# Patient Record
Sex: Male | Born: 1955 | Race: Black or African American | Hispanic: No | Marital: Single | Smoking: Current every day smoker
Health system: Southern US, Community
[De-identification: ages and names within clinical notes are randomized; demographics above are authoritative.]

## PROBLEM LIST (undated history)

## (undated) DIAGNOSIS — J449 Chronic obstructive pulmonary disease, unspecified: Secondary | ICD-10-CM

## (undated) DIAGNOSIS — Z91199 Patient's noncompliance with other medical treatment and regimen due to unspecified reason: Secondary | ICD-10-CM

## (undated) DIAGNOSIS — I5020 Unspecified systolic (congestive) heart failure: Secondary | ICD-10-CM

## (undated) DIAGNOSIS — I509 Heart failure, unspecified: Secondary | ICD-10-CM

## (undated) DIAGNOSIS — Z720 Tobacco use: Secondary | ICD-10-CM

## (undated) DIAGNOSIS — I5022 Chronic systolic (congestive) heart failure: Secondary | ICD-10-CM

## (undated) DIAGNOSIS — I5042 Chronic combined systolic (congestive) and diastolic (congestive) heart failure: Secondary | ICD-10-CM

## (undated) DIAGNOSIS — E119 Type 2 diabetes mellitus without complications: Secondary | ICD-10-CM

## (undated) DIAGNOSIS — J90 Pleural effusion, not elsewhere classified: Secondary | ICD-10-CM

## (undated) DIAGNOSIS — I428 Other cardiomyopathies: Secondary | ICD-10-CM

## (undated) DIAGNOSIS — Z9119 Patient's noncompliance with other medical treatment and regimen: Secondary | ICD-10-CM

## (undated) DIAGNOSIS — I10 Essential (primary) hypertension: Secondary | ICD-10-CM

## (undated) HISTORY — DX: Pleural effusion, not elsewhere classified: J90

## (undated) HISTORY — DX: Chronic combined systolic (congestive) and diastolic (congestive) heart failure: I50.42

## (undated) HISTORY — DX: Unspecified systolic (congestive) heart failure: I50.20

## (undated) HISTORY — DX: Patient's noncompliance with other medical treatment and regimen: Z91.19

## (undated) HISTORY — DX: Patient's noncompliance with other medical treatment and regimen due to unspecified reason: Z91.199

## (undated) HISTORY — DX: Chronic obstructive pulmonary disease, unspecified: J44.9

## (undated) HISTORY — DX: Type 2 diabetes mellitus without complications: E11.9

---

## 2005-10-27 ENCOUNTER — Emergency Department: Payer: Self-pay | Admitting: Emergency Medicine

## 2019-07-29 ENCOUNTER — Emergency Department: Payer: Medicaid - Out of State

## 2019-07-29 ENCOUNTER — Encounter: Payer: Self-pay | Admitting: Emergency Medicine

## 2019-07-29 ENCOUNTER — Emergency Department
Admission: EM | Admit: 2019-07-29 | Discharge: 2019-07-29 | Disposition: A | Discharge status: Home / Self Care | Payer: Medicaid - Out of State | Attending: Emergency Medicine | Admitting: Emergency Medicine

## 2019-07-29 ENCOUNTER — Other Ambulatory Visit: Payer: Self-pay

## 2019-07-29 DIAGNOSIS — Z87891 Personal history of nicotine dependence: Secondary | ICD-10-CM | POA: Diagnosis not present

## 2019-07-29 DIAGNOSIS — I110 Hypertensive heart disease with heart failure: Secondary | ICD-10-CM | POA: Diagnosis not present

## 2019-07-29 DIAGNOSIS — I10 Essential (primary) hypertension: Secondary | ICD-10-CM

## 2019-07-29 DIAGNOSIS — Z20828 Contact with and (suspected) exposure to other viral communicable diseases: Secondary | ICD-10-CM | POA: Insufficient documentation

## 2019-07-29 DIAGNOSIS — Z79899 Other long term (current) drug therapy: Secondary | ICD-10-CM | POA: Diagnosis not present

## 2019-07-29 DIAGNOSIS — I509 Heart failure, unspecified: Secondary | ICD-10-CM

## 2019-07-29 DIAGNOSIS — R2243 Localized swelling, mass and lump, lower limb, bilateral: Secondary | ICD-10-CM | POA: Diagnosis present

## 2019-07-29 HISTORY — DX: Essential (primary) hypertension: I10

## 2019-07-29 HISTORY — DX: Heart failure, unspecified: I50.9

## 2019-07-29 LAB — CBC
HCT: 33.5 % — ABNORMAL LOW (ref 39.0–52.0)
Hemoglobin: 10.5 g/dL — ABNORMAL LOW (ref 13.0–17.0)
MCH: 28.6 pg (ref 26.0–34.0)
MCHC: 31.3 g/dL (ref 30.0–36.0)
MCV: 91.3 fL (ref 80.0–100.0)
Platelets: 288 10*3/uL (ref 150–400)
RBC: 3.67 MIL/uL — ABNORMAL LOW (ref 4.22–5.81)
RDW: 15.9 % — ABNORMAL HIGH (ref 11.5–15.5)
WBC: 6.8 10*3/uL (ref 4.0–10.5)
nRBC: 0 % (ref 0.0–0.2)

## 2019-07-29 LAB — BASIC METABOLIC PANEL
Anion gap: 9 (ref 5–15)
BUN: 26 mg/dL — ABNORMAL HIGH (ref 8–23)
CO2: 23 mmol/L (ref 22–32)
Calcium: 8.8 mg/dL — ABNORMAL LOW (ref 8.9–10.3)
Chloride: 106 mmol/L (ref 98–111)
Creatinine, Ser: 1.2 mg/dL (ref 0.61–1.24)
GFR calc Af Amer: >60 mL/min (ref >60)
GFR calc non Af Amer: >60 mL/min (ref >60)
Glucose, Bld: 106 mg/dL — ABNORMAL HIGH (ref 70–99)
Potassium: 4.3 mmol/L (ref 3.5–5.1)
Sodium: 138 mmol/L (ref 135–145)

## 2019-07-29 LAB — BRAIN NATRIURETIC PEPTIDE: B Natriuretic Peptide: 2085 pg/mL — ABNORMAL HIGH (ref 0.0–100.0)

## 2019-07-29 LAB — TROPONIN I (HIGH SENSITIVITY)
Troponin I (High Sensitivity): 75 ng/L — ABNORMAL HIGH (ref <18)
Troponin I (High Sensitivity): 83 ng/L — ABNORMAL HIGH (ref <18)

## 2019-07-29 MED ORDER — HYDRALAZINE HCL 50 MG PO TABS
25.0000 mg | ORAL_TABLET | Freq: Once | ORAL | Status: AC
  Administered 2019-07-29: 25 mg via ORAL
  Filled 2019-07-29: qty 1

## 2019-07-29 MED ORDER — HYDRALAZINE HCL 25 MG PO TABS: 25.0000 mg | ORAL_TABLET | Freq: Three times a day (TID) | ORAL | 0 refills | Status: DC

## 2019-07-29 MED ORDER — FUROSEMIDE 40 MG PO TABS
40.0000 mg | ORAL_TABLET | Freq: Once | ORAL | Status: AC
  Administered 2019-07-29: 40 mg via ORAL
  Filled 2019-07-29: qty 1

## 2019-07-29 MED ORDER — BREO ELLIPTA 200-25 MCG/INH IN AEPB: 1.0000 | INHALATION_SPRAY | Freq: Every day | RESPIRATORY_TRACT | 0 refills | Status: DC

## 2019-07-29 MED ORDER — ALBUTEROL SULFATE HFA 108 (90 BASE) MCG/ACT IN AERS
2.0000 | INHALATION_SPRAY | Freq: Once | RESPIRATORY_TRACT | Status: AC
  Administered 2019-07-29: 2 via RESPIRATORY_TRACT
  Filled 2019-07-29: qty 6.7

## 2019-07-29 MED ORDER — SODIUM CHLORIDE 0.9% FLUSH: 3.0000 mL | Freq: Once | INTRAVENOUS | Status: DC

## 2019-07-29 MED ORDER — FUROSEMIDE 20 MG PO TABS: 20.0000 mg | ORAL_TABLET | Freq: Two times a day (BID) | ORAL | 0 refills | Status: DC

## 2019-07-29 MED ORDER — HYDROCHLOROTHIAZIDE 12.5 MG PO CAPS: 12.5000 mg | ORAL_CAPSULE | Freq: Every day | ORAL | 0 refills | Status: DC

## 2019-07-29 MED ORDER — HYDROCHLOROTHIAZIDE 12.5 MG PO CAPS
12.5000 mg | ORAL_CAPSULE | Freq: Once | ORAL | Status: AC
  Administered 2019-07-29: 12.5 mg via ORAL
  Filled 2019-07-29: qty 1

## 2019-07-29 NOTE — ED Provider Notes (Signed)
[facility] Emergency Department Provider Note   ____________________________________________   First MD Initiated Contact with Patient 07/29/19 [phone]     (approximate)  I have reviewed the triage vital signs and the nursing notes.   HISTORY  Chief Complaint Leg Swelling    HPI Kevin [name] is a 63 y.o. male with past medical history of hypertension, CHF, and COPD presents to the ED complaining of leg swelling and cough.  Patient reports that he relocated to the area back in March in order to care for his ill mother.  He had been taking his medications up until 2 weeks ago, when he ran out of all of them.  Since then, he has had gradually worsening swelling in his bilateral lower extremities and more recently has developed a dry cough.  He denies any fevers, chills, chest pain, or shortness of breath, but does complain of a sore throat.  He is not sure of all of the medications that he takes, but does have papers with him listing all of his prescriptions.  He has not yet established primary care in this area.  He is not aware of any recent sick contacts.        Past Medical History:  Diagnosis Date   CHF (congestive heart failure) (HCC)    Hypertension     There are no active problems to display for this patient.   History reviewed. No pertinent surgical history.  Prior to Admission medications   Medication Sig Start Date End Date Taking? Authorizing Provider  acetaminophen (TYLENOL) 325 MG tablet Take 650 mg by mouth every 6 (six) hours as needed.   Yes [provider]  albuterol (VENTOLIN HFA) 108 (90 Base) MCG/ACT inhaler Inhale 1-2 puffs into the lungs every 6 (six) hours as needed for wheezing or shortness of breath.   Yes [provider]  calcium carbonate (TUMS - DOSED IN MG ELEMENTAL CALCIUM) 500 MG chewable tablet Chew 1 tablet by mouth daily.   Yes [provider]  carvedilol (COREG) 25 MG tablet Take 25 mg by  mouth daily.   Yes [provider]  ferrous sulfate 325 (65 FE) MG tablet Take 325 mg by mouth daily with breakfast.   Yes [provider]  gabapentin (NEURONTIN) 300 MG capsule Take 300 mg by mouth 3 (three) times daily.   Yes [provider]  ipratropium-albuterol (DUONEB) 0.5-2.5 (3) MG/3ML SOLN Take 3 mLs by nebulization every 6 (six) hours as needed.   Yes [provider]  ondansetron (ZOFRAN) 4 MG tablet Take 4 mg by mouth every 8 (eight) hours as needed for nausea or vomiting.   Yes [provider]  rivaroxaban (XARELTO) 20 MG TABS tablet Take 20 mg by mouth daily with supper.   Yes [provider]  tiotropium (SPIRIVA) 18 MCG inhalation capsule Place 18 mcg into inhaler and inhale daily.   Yes [provider]  traMADol (ULTRAM) 50 MG tablet Take 50 mg by mouth every 6 (six) hours as needed.   Yes [provider]  fluticasone furoate-vilanterol (BREO ELLIPTA) 200-25 MCG/INH AEPB Inhale 1 puff into the lungs daily. 07/29/19   Chesley Noon, MD  furosemide (LASIX) 20 MG tablet Take 1 tablet (20 mg total) by mouth 2 (two) times daily for 10 days. 07/29/19 08/08/19  Chesley Noon, MD  hydrALAZINE (APRESOLINE) 25 MG tablet Take 1 tablet (25 mg total) by mouth 3 (three) times daily. 07/29/19 08/28/19  Chesley Noon, MD  hydrochlorothiazide (MICROZIDE)  12.5 MG capsule Take 1 capsule (12.5 mg total) by mouth daily. 07/29/19 08/28/19  Blake Divine, MD    Allergies Patient has no known allergies.  No family history on file.  Social History Social History   Tobacco Use   Smoking status: Former Smoker   Smokeless tobacco: Never Used  Substance Use Topics   Alcohol use: Never    Frequency: Never   Drug use: Not Currently    Review of Systems  Constitutional: No fever/chills Eyes: No visual changes. ENT: Positive for sore throat. Cardiovascular: Denies chest pain. Respiratory: Denies shortness of breath.   Positive for cough. Gastrointestinal: No abdominal pain.  No nausea, no vomiting.  No diarrhea.  No constipation. Genitourinary: Negative for dysuria. Musculoskeletal: Negative for back pain.  Positive for leg swelling. Skin: Negative for rash. Neurological: Negative for headaches, focal weakness or numbness.  ____________________________________________   PHYSICAL EXAM:  VITAL SIGNS: ED Triage Vitals [07/29/19 0820]  Enc Vitals Group     BP (!) 176/125     Pulse Rate 89     Resp 18     Temp 98.2 F (36.8 C)     Temp Source Oral     SpO2 100 %     Weight 200 lb (90.7 kg)     Height 5' 7.5" (1.715 m)     Head Circumference      Peak Flow      Pain Score 7     Pain Loc      Pain Edu?      Excl. in [place]?     Constitutional: Alert and oriented. Eyes: Conjunctivae are normal. Head: Atraumatic. Nose: No congestion/rhinnorhea. Mouth/Throat: Mucous membranes are moist. Neck: Normal ROM Cardiovascular: Normal rate, regular rhythm. Grossly normal heart sounds. Respiratory: Normal respiratory effort.  No retractions. Lungs CTAB. Gastrointestinal: Soft and nontender. No distention. Genitourinary: deferred Musculoskeletal: 2+ pitting edema to bilateral lower extremities with no calf tenderness. Neurologic:  Normal speech and language. No gross focal neurologic deficits are appreciated. Skin:  Skin is warm, dry and intact. No rash noted. Psychiatric: Mood and affect are normal. Speech and behavior are normal.  ____________________________________________   LABS (all labs ordered are listed, but only abnormal results are displayed)  Labs Reviewed  BASIC METABOLIC PANEL - Abnormal; Notable for the following components:      Result Value   Glucose, Bld 106 (*)    BUN 26 (*)    Calcium 8.8 (*)    All other components within normal limits  CBC - Abnormal; Notable for the following components:   RBC 3.67 (*)    Hemoglobin 10.5 (*)    HCT 33.5 (*)    RDW 15.9 (*)    All other  components within normal limits  BRAIN NATRIURETIC PEPTIDE - Abnormal; Notable for the following components:   B Natriuretic Peptide 2,085.0 (*)    All other components within normal limits  TROPONIN I (HIGH SENSITIVITY) - Abnormal; Notable for the following components:   Troponin I (High Sensitivity) 75 (*)    All other components within normal limits  TROPONIN I (HIGH SENSITIVITY) - Abnormal; Notable for the following components:   Troponin I (High Sensitivity) 83 (*)    All other components within normal limits  NOVEL CORONAVIRUS, NAA (HOSP ORDER, SEND-OUT TO REF LAB; TAT 18-24 HRS)   ____________________________________________  EKG  ED ECG REPORT I, Blake Divine, the attending physician, personally viewed and interpreted this ECG.   Date: 07/29/2019  EKG Time: 8:33  Rate: 86  Rhythm: normal sinus rhythm  Axis: RAD  Intervals:nonspecific intraventricular conduction delay  ST&T Change: LVH   PROCEDURES  Procedure(s) performed (including Critical Care):  Procedures   ____________________________________________   INITIAL IMPRESSION / ASSESSMENT AND PLAN / ED COURSE       63 year old male with hx of CHF presents to the ED with increasing lower extremity swelling over the past couple of weeks since he has been out of his medications.  He is in no respiratory distress and denies any chest pain.  EKG shows no evidence of acute ischemia and chest x-ray shows cardiomegaly without evidence of pulmonary edema.  Suspect mild CHF exacerbation given his lower extremity swelling.  We will need to start patient back on his home medications as he is significantly hypertensive here.  He does have Xarelto on his med list, is unsure what he took this for but states that his PCP had since taken him off of it.  Labs pending, if unremarkable patient would be appropriate with close follow-up with the heart failure clinic as well as a local PCP.  Given his dry cough, we will also perform  COVID-19 testing, although this may be related to his COPD.  Patient remains hypertensive following home dose of medications, however there is no evidence of hypertensive emergency.  Doubt ACS as troponin is stable on recheck, suspect elevation related to chronic kidney disease as well as CHF.  Patient was given initial dose of Lasix here, referred to heart failure clinic for close follow-up.  Counseled patient to return to the ED for new or worsening symptoms, patient agrees with plan.      ____________________________________________   FINAL CLINICAL IMPRESSION(S) / ED DIAGNOSES  Final diagnoses:  Chronic congestive heart failure, unspecified heart failure type (HCC)  Uncontrolled hypertension     ED Discharge Orders         Ordered    AMB referral to CHF clinic     07/29/19 1247    fluticasone furoate-vilanterol (BREO ELLIPTA) 200-25 MCG/INH AEPB  Daily     07/29/19 1251    hydrALAZINE (APRESOLINE) 25 MG tablet  3 times daily     07/29/19 1251    hydrochlorothiazide (MICROZIDE) 12.5 MG capsule  Daily     07/29/19 1251    furosemide (LASIX) 20 MG tablet  2 times daily     07/29/19 1251           Note:  This document was prepared using Dragon voice recognition software and may include unintentional dictation errors.   Chesley Noon, MD 07/29/19 [phone]

## 2019-07-29 NOTE — Discharge Instructions (Signed)
Please schedule follow-up with both the heart failure clinic and a local primary care doctor as soon as possible.  You have been written prescriptions for your home blood pressure medications and inhaler and you will also be started on Lasix, which is a fluid pill.  If you have any worsening symptoms, please return to the ER for reevaluation.

## 2019-07-29 NOTE — ED Notes (Signed)
Assisted pt to use the urinal.

## 2019-07-29 NOTE — ED Triage Notes (Signed)
Lower bilateral extremity swelling / pain ( hx of CHF) , been in the area since March , ran out of blood pressure meds x2 weeks , dry cough/ sore throat x2 weeks. Pt uses a wheeled walker to assist with ambulation

## 2019-07-30 LAB — NOVEL CORONAVIRUS, NAA (HOSP ORDER, SEND-OUT TO REF LAB; TAT 18-24 HRS): SARS-CoV-2, NAA: NOT DETECTED

## 2019-08-03 ENCOUNTER — Emergency Department: Payer: Self-pay

## 2019-08-03 ENCOUNTER — Other Ambulatory Visit: Payer: Self-pay

## 2019-08-03 ENCOUNTER — Inpatient Hospital Stay
Admission: EM | Admit: 2019-08-03 | Discharge: 2019-08-10 | DRG: 292 | Disposition: A | Discharge status: Home Health | Payer: Self-pay | Attending: Internal Medicine | Admitting: Internal Medicine

## 2019-08-03 DIAGNOSIS — I509 Heart failure, unspecified: Secondary | ICD-10-CM

## 2019-08-03 DIAGNOSIS — Z79891 Long term (current) use of opiate analgesic: Secondary | ICD-10-CM

## 2019-08-03 DIAGNOSIS — I071 Rheumatic tricuspid insufficiency: Secondary | ICD-10-CM | POA: Diagnosis present

## 2019-08-03 DIAGNOSIS — E1140 Type 2 diabetes mellitus with diabetic neuropathy, unspecified: Secondary | ICD-10-CM

## 2019-08-03 DIAGNOSIS — J9801 Acute bronchospasm: Secondary | ICD-10-CM | POA: Diagnosis not present

## 2019-08-03 DIAGNOSIS — R600 Localized edema: Secondary | ICD-10-CM

## 2019-08-03 DIAGNOSIS — I110 Hypertensive heart disease with heart failure: Principal | ICD-10-CM | POA: Diagnosis present

## 2019-08-03 DIAGNOSIS — J9811 Atelectasis: Secondary | ICD-10-CM | POA: Diagnosis present

## 2019-08-03 DIAGNOSIS — J90 Pleural effusion, not elsewhere classified: Secondary | ICD-10-CM

## 2019-08-03 DIAGNOSIS — I5023 Acute on chronic systolic (congestive) heart failure: Secondary | ICD-10-CM

## 2019-08-03 DIAGNOSIS — R7401 Elevation of levels of liver transaminase levels: Secondary | ICD-10-CM

## 2019-08-03 DIAGNOSIS — Z87891 Personal history of nicotine dependence: Secondary | ICD-10-CM

## 2019-08-03 DIAGNOSIS — J449 Chronic obstructive pulmonary disease, unspecified: Secondary | ICD-10-CM

## 2019-08-03 DIAGNOSIS — J441 Chronic obstructive pulmonary disease with (acute) exacerbation: Secondary | ICD-10-CM

## 2019-08-03 DIAGNOSIS — I959 Hypotension, unspecified: Secondary | ICD-10-CM

## 2019-08-03 DIAGNOSIS — R55 Syncope and collapse: Secondary | ICD-10-CM | POA: Diagnosis not present

## 2019-08-03 DIAGNOSIS — I5043 Acute on chronic combined systolic (congestive) and diastolic (congestive) heart failure: Secondary | ICD-10-CM | POA: Diagnosis present

## 2019-08-03 DIAGNOSIS — R601 Generalized edema: Secondary | ICD-10-CM

## 2019-08-03 DIAGNOSIS — Z79899 Other long term (current) drug therapy: Secondary | ICD-10-CM

## 2019-08-03 DIAGNOSIS — I420 Dilated cardiomyopathy: Secondary | ICD-10-CM | POA: Diagnosis present

## 2019-08-03 DIAGNOSIS — I5033 Acute on chronic diastolic (congestive) heart failure: Secondary | ICD-10-CM

## 2019-08-03 DIAGNOSIS — Z9889 Other specified postprocedural states: Secondary | ICD-10-CM

## 2019-08-03 DIAGNOSIS — I160 Hypertensive urgency: Secondary | ICD-10-CM | POA: Diagnosis present

## 2019-08-03 DIAGNOSIS — E876 Hypokalemia: Secondary | ICD-10-CM | POA: Diagnosis not present

## 2019-08-03 DIAGNOSIS — G629 Polyneuropathy, unspecified: Secondary | ICD-10-CM

## 2019-08-03 DIAGNOSIS — R7989 Other specified abnormal findings of blood chemistry: Secondary | ICD-10-CM

## 2019-08-03 DIAGNOSIS — Z20828 Contact with and (suspected) exposure to other viral communicable diseases: Secondary | ICD-10-CM | POA: Diagnosis present

## 2019-08-03 DIAGNOSIS — I4729 Other ventricular tachycardia: Secondary | ICD-10-CM

## 2019-08-03 DIAGNOSIS — I472 Ventricular tachycardia: Secondary | ICD-10-CM | POA: Diagnosis not present

## 2019-08-03 LAB — CBC
HCT: 34.4 % — ABNORMAL LOW (ref 39.0–52.0)
Hemoglobin: 10.7 g/dL — ABNORMAL LOW (ref 13.0–17.0)
MCH: 28.2 pg (ref 26.0–34.0)
MCHC: 31.1 g/dL (ref 30.0–36.0)
MCV: 90.5 fL (ref 80.0–100.0)
Platelets: 310 10*3/uL (ref 150–400)
RBC: 3.8 MIL/uL — ABNORMAL LOW (ref 4.22–5.81)
RDW: 16 % — ABNORMAL HIGH (ref 11.5–15.5)
WBC: 8.8 10*3/uL (ref 4.0–10.5)
nRBC: 0 % (ref 0.0–0.2)

## 2019-08-03 LAB — HEPATIC FUNCTION PANEL
ALT: 53 U/L — ABNORMAL HIGH (ref 0–44)
AST: 48 U/L — ABNORMAL HIGH (ref 15–41)
Albumin: 3.1 g/dL — ABNORMAL LOW (ref 3.5–5.0)
Alkaline Phosphatase: 165 U/L — ABNORMAL HIGH (ref 38–126)
Bilirubin, Direct: 0.5 mg/dL — ABNORMAL HIGH (ref 0.0–0.2)
Indirect Bilirubin: 0.8 mg/dL (ref 0.3–0.9)
Total Bilirubin: 1.3 mg/dL — ABNORMAL HIGH (ref 0.3–1.2)
Total Protein: 6.9 g/dL (ref 6.5–8.1)

## 2019-08-03 LAB — URINALYSIS, ROUTINE W REFLEX MICROSCOPIC
Bacteria, UA: NONE SEEN
Bilirubin Urine: NEGATIVE
Glucose, UA: NEGATIVE mg/dL
Hgb urine dipstick: NEGATIVE
Ketones, ur: NEGATIVE mg/dL
Leukocytes,Ua: NEGATIVE
Nitrite: NEGATIVE
Protein, ur: 30 mg/dL — AB
Specific Gravity, Urine: 1.006 (ref 1.005–1.030)
Squamous Epithelial / HPF: NONE SEEN (ref 0–5)
pH: 7 (ref 5.0–8.0)

## 2019-08-03 LAB — BASIC METABOLIC PANEL
Anion gap: 8 (ref 5–15)
BUN: 24 mg/dL — ABNORMAL HIGH (ref 8–23)
CO2: 25 mmol/L (ref 22–32)
Calcium: 8.3 mg/dL — ABNORMAL LOW (ref 8.9–10.3)
Chloride: 104 mmol/L (ref 98–111)
Creatinine, Ser: 1.17 mg/dL (ref 0.61–1.24)
GFR calc Af Amer: >60 mL/min (ref >60)
GFR calc non Af Amer: >60 mL/min (ref >60)
Glucose, Bld: 119 mg/dL — ABNORMAL HIGH (ref 70–99)
Potassium: 3.6 mmol/L (ref 3.5–5.1)
Sodium: 137 mmol/L (ref 135–145)

## 2019-08-03 LAB — BRAIN NATRIURETIC PEPTIDE: B Natriuretic Peptide: 2085 pg/mL — ABNORMAL HIGH (ref 0.0–100.0)

## 2019-08-03 LAB — MAGNESIUM: Magnesium: 1.9 mg/dL (ref 1.7–2.4)

## 2019-08-03 MED ORDER — HYDRALAZINE HCL 20 MG/ML IJ SOLN: 10.0000 mg | Freq: Four times a day (QID) | INTRAMUSCULAR | Status: DC | PRN

## 2019-08-03 MED ORDER — CARVEDILOL 25 MG PO TABS
25.0000 mg | ORAL_TABLET | Freq: Every day | ORAL | Status: DC
  Administered 2019-08-04: 25 mg via ORAL
  Filled 2019-08-03: qty 1

## 2019-08-03 MED ORDER — HYDRALAZINE HCL 25 MG PO TABS
25.0000 mg | ORAL_TABLET | Freq: Three times a day (TID) | ORAL | Status: DC
  Administered 2019-08-04 (×2): 25 mg via ORAL
  Filled 2019-08-03: qty 0.5
  Filled 2019-08-03 (×2): qty 1

## 2019-08-03 MED ORDER — FUROSEMIDE 10 MG/ML IJ SOLN
40.0000 mg | Freq: Two times a day (BID) | INTRAMUSCULAR | Status: DC
  Administered 2019-08-04: 40 mg via INTRAVENOUS
  Filled 2019-08-03: qty 4

## 2019-08-03 MED ORDER — IPRATROPIUM-ALBUTEROL 0.5-2.5 (3) MG/3ML IN SOLN: 3.0000 mL | RESPIRATORY_TRACT | Status: DC | PRN

## 2019-08-03 MED ORDER — HYDRALAZINE HCL 20 MG/ML IJ SOLN
10.0000 mg | Freq: Once | INTRAMUSCULAR | Status: AC
  Administered 2019-08-03: 10 mg via INTRAVENOUS
  Filled 2019-08-03: qty 1

## 2019-08-03 MED ORDER — HYDROCHLOROTHIAZIDE 12.5 MG PO CAPS
12.5000 mg | ORAL_CAPSULE | Freq: Every day | ORAL | Status: DC
  Administered 2019-08-04 (×2): 12.5 mg via ORAL
  Filled 2019-08-03 (×3): qty 1

## 2019-08-03 MED ORDER — ONDANSETRON HCL 4 MG PO TABS: 4.0000 mg | ORAL_TABLET | Freq: Three times a day (TID) | ORAL | Status: DC | PRN

## 2019-08-03 MED ORDER — SODIUM CHLORIDE 0.9% FLUSH
3.0000 mL | Freq: Two times a day (BID) | INTRAVENOUS | Status: DC
  Administered 2019-08-03 – 2019-08-10 (×14): 3 mL via INTRAVENOUS

## 2019-08-03 MED ORDER — GABAPENTIN 300 MG PO CAPS
300.0000 mg | ORAL_CAPSULE | Freq: Three times a day (TID) | ORAL | Status: DC
  Administered 2019-08-03 – 2019-08-10 (×20): 300 mg via ORAL
  Filled 2019-08-03 (×11): qty 1
  Filled 2019-08-03: qty 3
  Filled 2019-08-03 (×8): qty 1

## 2019-08-03 MED ORDER — FERROUS SULFATE 325 (65 FE) MG PO TABS
325.0000 mg | ORAL_TABLET | Freq: Every day | ORAL | Status: DC
  Administered 2019-08-06 – 2019-08-08 (×3): 325 mg via ORAL
  Filled 2019-08-03 (×7): qty 1

## 2019-08-03 MED ORDER — TIOTROPIUM BROMIDE MONOHYDRATE 18 MCG IN CAPS
18.0000 ug | ORAL_CAPSULE | Freq: Every day | RESPIRATORY_TRACT | Status: DC
  Administered 2019-08-04 – 2019-08-10 (×7): 18 ug via RESPIRATORY_TRACT
  Filled 2019-08-03 (×3): qty 5

## 2019-08-03 MED ORDER — ISOSORB DINITRATE-HYDRALAZINE 20-37.5 MG PO TABS
1.0000 | ORAL_TABLET | Freq: Three times a day (TID) | ORAL | Status: DC
  Administered 2019-08-04: 1 via ORAL
  Filled 2019-08-03 (×2): qty 1

## 2019-08-03 MED ORDER — SODIUM CHLORIDE 0.9% FLUSH: 3.0000 mL | INTRAVENOUS | Status: DC | PRN

## 2019-08-03 MED ORDER — FUROSEMIDE 20 MG PO TABS
20.0000 mg | ORAL_TABLET | Freq: Two times a day (BID) | ORAL | Status: DC
  Administered 2019-08-04: 20 mg via ORAL
  Filled 2019-08-03: qty 0.5
  Filled 2019-08-03: qty 1

## 2019-08-03 MED ORDER — ACETAMINOPHEN 325 MG PO TABS: 650.0000 mg | ORAL_TABLET | ORAL | Status: DC | PRN

## 2019-08-03 MED ORDER — LABETALOL HCL 5 MG/ML IV SOLN
20.0000 mg | Freq: Once | INTRAVENOUS | Status: AC
  Administered 2019-08-03: 20 mg via INTRAVENOUS
  Filled 2019-08-03: qty 4

## 2019-08-03 MED ORDER — FUROSEMIDE 10 MG/ML IJ SOLN
80.0000 mg | Freq: Once | INTRAMUSCULAR | Status: AC
  Administered 2019-08-03: 18:00:00 80 mg via INTRAVENOUS
  Filled 2019-08-03: qty 8

## 2019-08-03 MED ORDER — ONDANSETRON HCL 4 MG/2ML IJ SOLN: 4.0000 mg | Freq: Four times a day (QID) | INTRAMUSCULAR | Status: DC | PRN

## 2019-08-03 MED ORDER — TRAMADOL HCL 50 MG PO TABS: 50.0000 mg | ORAL_TABLET | Freq: Four times a day (QID) | ORAL | Status: DC | PRN

## 2019-08-03 MED ORDER — LABETALOL HCL 5 MG/ML IV SOLN: 20.0000 mg | INTRAVENOUS | Status: DC | PRN

## 2019-08-03 MED ORDER — POTASSIUM CHLORIDE 20 MEQ PO PACK
40.0000 meq | PACK | Freq: Once | ORAL | Status: AC
  Administered 2019-08-03: 23:00:00 40 meq via ORAL
  Filled 2019-08-03: qty 2

## 2019-08-03 MED ORDER — ENOXAPARIN SODIUM 40 MG/0.4ML ~~LOC~~ SOLN
40.0000 mg | SUBCUTANEOUS | Status: DC
  Administered 2019-08-03 – 2019-08-07 (×5): 40 mg via SUBCUTANEOUS
  Filled 2019-08-03 (×6): qty 0.4

## 2019-08-03 MED ORDER — CALCIUM CARBONATE ANTACID 500 MG PO CHEW
1.0000 | CHEWABLE_TABLET | Freq: Every day | ORAL | Status: DC
  Administered 2019-08-04 – 2019-08-10 (×7): 200 mg via ORAL
  Filled 2019-08-03 (×7): qty 1

## 2019-08-03 MED ORDER — SODIUM CHLORIDE 0.9 % IV SOLN
250.0000 mL | INTRAVENOUS | Status: DC | PRN
  Administered 2019-08-08: 50 mL via INTRAVENOUS

## 2019-08-03 MED ORDER — SODIUM CHLORIDE 0.9% FLUSH: 3.0000 mL | Freq: Once | INTRAVENOUS | Status: DC

## 2019-08-03 MED ORDER — ZOLPIDEM TARTRATE 5 MG PO TABS: 5.0000 mg | ORAL_TABLET | Freq: Every evening | ORAL | Status: DC | PRN

## 2019-08-03 MED ORDER — MORPHINE SULFATE (PF) 4 MG/ML IV SOLN
4.0000 mg | Freq: Once | INTRAVENOUS | Status: AC
  Administered 2019-08-03: 4 mg via INTRAVENOUS
  Filled 2019-08-03: qty 1

## 2019-08-03 NOTE — ED Notes (Signed)
Pt provided meal tray

## 2019-08-03 NOTE — ED Provider Notes (Addendum)
[facility] Emergency Department Provider Note       Time seen: ----------------------------------------- 4:31 PM on 08/03/2019 -----------------------------------------   I have reviewed the triage vital signs and the nursing notes.  HISTORY   Chief Complaint Leg Swelling    HPI Kevin [name] is a 63 y.o. male with a history of CHF, hypertension who presents to the ED for bilateral lower extremity swelling with dry cough.  He was seen here last week for heart failure.  Patient states he has an appointment with the heart failure clinic tomorrow.  Has started taking diuretics and has been taking his blood pressure medicine.  Past Medical History:  Diagnosis Date  . CHF (congestive heart failure) (HCC)   . Hypertension     There are no active problems to display for this patient.   History reviewed. No pertinent surgical history.  Allergies Patient has no known allergies.  Social History Social History   Tobacco Use  . Smoking status: Former Games developer  . Smokeless tobacco: Never Used  Substance Use Topics  . Alcohol use: Never    Frequency: Never  . Drug use: Not Currently   Review of Systems Constitutional: Negative for fever. Cardiovascular: Negative for chest pain. Respiratory: Negative for shortness of breath.  Positive for cough Gastrointestinal: Negative for abdominal pain, vomiting and diarrhea. Musculoskeletal: Positive for lower extremity pain and swelling Skin: Negative for rash. Neurological: Negative for headaches, focal weakness or numbness.  All systems negative/normal/unremarkable except as stated in the HPI  ____________________________________________   PHYSICAL EXAM:  VITAL SIGNS: ED Triage Vitals  Enc Vitals Group     BP 08/03/19 1331 (!) 152/101     Pulse Rate 08/03/19 1330 100     Resp 08/03/19 1330 20     Temp 08/03/19 1330 98.1 F (36.7 C)     Temp Source 08/03/19 1330 Oral     SpO2 08/03/19 1330 100 %   Weight 08/03/19 1331 200 lb (90.7 kg)     Height 08/03/19 1331 5\' 8"  (1.727 m)     Head Circumference --      Peak Flow --      Pain Score 08/03/19 1331 0     Pain Loc --      Pain Edu? --      Excl. in [place]? --    Constitutional: Alert and oriented. Well appearing and in no distress. Eyes: Conjunctivae are normal. Normal extraocular movements. ENT      Head: Normocephalic and atraumatic.      Nose: No congestion/rhinnorhea.      Mouth/Throat: Mucous membranes are moist.      Neck: No stridor. Cardiovascular: Normal rate, regular rhythm. No murmurs, rubs, or gallops. Respiratory: Normal respiratory effort without tachypnea nor retractions. Breath sounds are clear and equal bilaterally. No wheezes/rales/rhonchi. Gastrointestinal: Soft and nontender. Normal bowel sounds Musculoskeletal: Nontender with normal range of motion in extremities.  Bilateral pitting edema is noted Neurologic:  Normal speech and language. No gross focal neurologic deficits are appreciated.  Skin:  Skin is warm, dry and intact. No rash noted. Psychiatric: Mood and affect are normal. Speech and behavior are normal.  ____________________________________________  EKG: Interpreted by me.  Sinus rhythm with rate of 94 bpm, left axis deviation, possible septal infarct age-indeterminate, normal QT  ____________________________________________  ED COURSE:  As part of my medical decision making, I reviewed the following data within the electronic MEDICAL RECORD NUMBER History obtained from family if available, nursing notes, old chart and ekg,  as well as notes from prior ED visits. Patient presented for edema and possible heart failure, we will assess with labs and imaging as indicated at this time.   Procedures  Kevin [name] was evaluated in Emergency Department on 08/03/2019 for the symptoms described in the history of present illness. He was evaluated in the context of the global COVID-19 pandemic, which necessitated  consideration that the patient might be at risk for infection with the SARS-CoV-2 virus that causes COVID-19. Institutional protocols and algorithms that pertain to the evaluation of patients at risk for COVID-19 are in a state of rapid change based on information released by regulatory bodies including the CDC and federal and state organizations. These policies and algorithms were followed during the patient's care in the ED.  ____________________________________________   LABS (pertinent positives/negatives)  Labs Reviewed  BASIC METABOLIC PANEL - Abnormal; Notable for the following components:      Result Value   Glucose, Bld 119 (*)    BUN 24 (*)    Calcium 8.3 (*)    All other components within normal limits  CBC - Abnormal; Notable for the following components:   RBC 3.80 (*)    Hemoglobin 10.7 (*)    HCT 34.4 (*)    RDW 16.0 (*)    All other components within normal limits  HEPATIC FUNCTION PANEL - Abnormal; Notable for the following components:   Albumin 3.1 (*)    AST 48 (*)    ALT 53 (*)    Alkaline Phosphatase 165 (*)    Total Bilirubin 1.3 (*)    Bilirubin, Direct 0.5 (*)    All other components within normal limits  BRAIN NATRIURETIC PEPTIDE - Abnormal; Notable for the following components:   B Natriuretic Peptide 2,085.0 (*)    All other components within normal limits  MAGNESIUM  URINALYSIS, ROUTINE W REFLEX MICROSCOPIC   CRITICAL CARE Performed by: Laurence Aly   Total critical care time: 30 minutes  Critical care time was exclusive of separately billable procedures and treating other patients.  Critical care was necessary to treat or prevent imminent or life-threatening deterioration.  Critical care was time spent personally by me on the following activities: development of treatment plan with patient and/or surrogate as well as nursing, discussions with consultants, evaluation of patient's response to treatment, examination of patient, obtaining  history from patient or surrogate, ordering and performing treatments and interventions, ordering and review of laboratory studies, ordering and review of radiographic studies, pulse oximetry and re-evaluation of patient's condition.  RADIOLOGY Images were viewed by me  Chest x-ray IMPRESSION: 1. Small right pleural effusion with areas of subsegmental atelectasis in the right lower lobe. 2. Moderate cardiomegaly. 3. Aortic atherosclerosis. ____________________________________________   DIFFERENTIAL DIAGNOSIS   CHF, peripheral edema, medication noncompliance, renal failure, electrolyte abnormality  FINAL ASSESSMENT AND PLAN  CHF exacerbation, hypertensive urgency   Plan: The patient had presented for CHF symptoms. Patient's labs appear to be at his baseline. Patient's imaging revealed small right pleural effusion with some atelectasis.  I have given IV Lasix as well as some morphine for pain.  He also received IV hydralazine and subsequently IV labetalol for his blood pressure.  He does have evidence of hypertensive urgency.  Overall he has begun diuresing but still has significant edema and is markedly hypertensive.  I will discuss with the hospitalist for admission.   Laurence Aly, MD    Note: This note was generated in part or whole with voice recognition  software. Voice recognition is usually quite accurate but there are transcription errors that can and very often do occur. I apologize for any typographical errors that were not detected and corrected.     Emily Filbert, MD 08/03/19 1637    Emily Filbert, MD 08/03/19 Kevin [name]

## 2019-08-03 NOTE — ED Triage Notes (Addendum)
Pt c/o increased BL LE swelling with a dry cough, pt was seen here in the past week for the same. Pt is a/ox4,  In NAD. Pt is here visiting and caring for his mother, lives in [place]

## 2019-08-03 NOTE — ED Notes (Signed)
ED TO INPATIENT HANDOFF REPORT  ED Nurse Name and Phone #: Danae Orleans Name/Age/Gender Kevin [name] 63 y.o. male Room/Bed: ED33A/ED33A  Code Status   Code Status: Full Code  Home/SNF/Other Home Patient oriented to: self, place, time and situation Is this baseline? Yes   Triage Complete: Triage complete  Chief Complaint Leg Swelling/Shortness of Breath  Triage Note Pt c/o increased BL LE swelling with a dry cough, pt was seen here in the past week for the same. Pt is a/ox4,  In NAD. Pt is here visiting and caring for his mother, lives in [place]   Allergies No Known Allergies  Level of Care/Admitting Diagnosis ED Disposition    ED Disposition Condition Comment   Admit  Hospital Area: [facility] [[id number]]  Level of Care: Telemetry [5]  Covid Evaluation: Asymptomatic Screening Protocol (No Symptoms)  Diagnosis: Hypertensive urgency [527782]  Admitting Physician: Hannah Beat [[id number]]  Attending Physician: Hannah Beat [[id number]]  Estimated length of stay: past midnight tomorrow  Certification:: I certify this patient will need inpatient services for at least 2 midnights  PT Class (Do Not Modify): Inpatient [101]  PT Acc Code (Do Not Modify): Private [1]       B Medical/Surgery History Past Medical History:  Diagnosis Date  . CHF (congestive heart failure) (HCC)   . Hypertension    History reviewed. No pertinent surgical history.   A IV Location/Drains/Wounds Patient Lines/Drains/Airways Status   Active Line/Drains/Airways    Name:   Placement date:   Placement time:   Site:   Days:   Peripheral IV 08/03/19 Left Antecubital   08/03/19    1654    Antecubital   less than 1          Intake/Output Last 24 hours  Intake/Output Summary (Last 24 hours) at 08/03/2019 2249 Last data filed at 08/03/2019 2028 Gross per 24 hour  Intake -  Output 1075 ml  Net -1075 ml    Labs/Imaging Results for orders placed or performed during the  hospital encounter of 08/03/19 (from the past 48 hour(s))  Basic metabolic panel     Status: Abnormal   Collection Time: 08/03/19  1:35 PM  Result Value Ref Range   Sodium 137 135 - 145 mmol/L   Potassium 3.6 3.5 - 5.1 mmol/L   Chloride 104 98 - 111 mmol/L   CO2 25 22 - 32 mmol/L   Glucose, Bld 119 (H) 70 - 99 mg/dL   BUN 24 (H) 8 - 23 mg/dL   Creatinine, Ser 0.86 0.61 - 1.24 mg/dL   Calcium 8.3 (L) 8.9 - 10.3 mg/dL   GFR calc non Af Amer >60 >60 mL/min   GFR calc Af Amer >60 >60 mL/min   Anion gap 8 5 - 15    Comment: Performed at [facility], [address] [place], [place] [address]  CBC     Status: Abnormal   Collection Time: 08/03/19  1:35 PM  Result Value Ref Range   WBC 8.8 4.0 - 10.5 K/uL   RBC 3.80 (L) 4.22 - 5.81 MIL/uL   Hemoglobin 10.7 (L) 13.0 - 17.0 g/dL   HCT 09.3 (L) 26.7 - 12.4 %   MCV 90.5 80.0 - 100.0 fL   MCH 28.2 26.0 - 34.0 pg   MCHC 31.1 30.0 - 36.0 g/dL   RDW 58.0 (H) 99.8 - 33.8 %   Platelets 310 150 - 400 K/uL   nRBC 0.0 0.0 - 0.2 %  Comment: Performed at [facility], [address] [place], [place] [address]  Hepatic function panel     Status: Abnormal   Collection Time: 08/03/19  1:35 PM  Result Value Ref Range   Total Protein 6.9 6.5 - 8.1 g/dL   Albumin 3.1 (L) 3.5 - 5.0 g/dL   AST 48 (H) 15 - 41 U/L   ALT 53 (H) 0 - 44 U/L   Alkaline Phosphatase 165 (H) 38 - 126 U/L   Total Bilirubin 1.3 (H) 0.3 - 1.2 mg/dL   Bilirubin, Direct 0.5 (H) 0.0 - 0.2 mg/dL   Indirect Bilirubin 0.8 0.3 - 0.9 mg/dL    Comment: Performed at [facility], [address] [place], [place] [address]  Brain natriuretic peptide     Status: Abnormal   Collection Time: 08/03/19  1:35 PM  Result Value Ref Range   B Natriuretic Peptide 2,085.0 (H) 0.0 - 100.0 pg/mL    Comment: Performed at [facility], [address] [place], [place] [address]  Magnesium     Status: None   Collection Time: 08/03/19  1:35 PM  Result Value Ref  Range   Magnesium 1.9 1.7 - 2.4 mg/dL    Comment: Performed at [facility], [address] [place], [place] [address]  Urinalysis, Routine w reflex microscopic     Status: Abnormal   Collection Time: 08/03/19  7:15 PM  Result Value Ref Range   Color, Urine YELLOW (A) YELLOW   APPearance CLEAR (A) CLEAR   Specific Gravity, Urine 1.006 1.005 - 1.030   pH 7.0 5.0 - 8.0   Glucose, UA NEGATIVE NEGATIVE mg/dL   Hgb urine dipstick NEGATIVE NEGATIVE   Bilirubin Urine NEGATIVE NEGATIVE   Ketones, ur NEGATIVE NEGATIVE mg/dL   Protein, ur 30 (A) NEGATIVE mg/dL   Nitrite NEGATIVE NEGATIVE   Leukocytes,Ua NEGATIVE NEGATIVE   WBC, UA 0-5 0 - 5 WBC/hpf   Bacteria, UA NONE SEEN NONE SEEN   Squamous Epithelial / LPF NONE SEEN 0 - 5   Mucus PRESENT    Hyaline Casts, UA PRESENT     Comment: Performed at [facility], [address] [place], [place] [address]   Dg Chest 2 View  Result Date: 08/03/2019 CLINICAL DATA:  63 year old male with history of cough and increasing bilateral lower extremity swelling. EXAM: CHEST - 2 VIEW COMPARISON:  Chest x-ray 07/29/2019. FINDINGS: Small right pleural effusion with probable subsegmental atelectasis in the right lung base. Left lung is clear. No left pleural effusion. Moderate cardiomegaly. Dilatation of the central pulmonary arteries, concerning for pulmonary arterial hypertension. No evidence of pulmonary edema. Upper mediastinal contours are otherwise within normal limits. Aortic atherosclerosis. IMPRESSION: 1. Small right pleural effusion with areas of subsegmental atelectasis in the right lower lobe. 2. Moderate cardiomegaly. 3. Aortic atherosclerosis. Electronically Signed   By: Trudie Reed M.D.   On: 08/03/2019 13:56    Pending Labs Unresulted Labs (From admission, onward)    Start     Ordered   08/04/19 0500  Basic metabolic panel  Daily,   STAT     08/03/19 1948   08/04/19 0500  CBC WITH DIFFERENTIAL  Tomorrow morning,   STAT      08/03/19 1948   08/04/19 0500  Hepatic function panel  Tomorrow morning,   STAT     08/03/19 2012   08/03/19 1941  HIV Antibody (routine testing w rflx)  (HIV Antibody (Routine testing w reflex) panel)  Once,  STAT     08/03/19 1948   08/03/19 1910  SARS CORONAVIRUS 2 (TAT 6-24 HRS) Nasopharyngeal Nasopharyngeal Swab  (Asymptomatic/Tier 3)  Once,   STAT    Question Answer Comment  Is this test for diagnosis or screening Screening   Symptomatic for COVID-19 as defined by CDC No   Hospitalized for COVID-19 No   Admitted to ICU for COVID-19 No   Previously tested for COVID-19 Yes   Resident in a congregate (group) care setting No   Employed in healthcare setting No      08/03/19 1909          Vitals/Pain Today's Vitals   08/03/19 2026 08/03/19 2027 08/03/19 2030 08/03/19 2115  BP: (!) 130/101 (!) 128/99 (!) 146/106   Pulse: 84 78  75  Resp: (!) 25 (!) 31  16  Temp:      TempSrc:      SpO2: 98% 99%  97%  Weight:      Height:      PainSc:        Isolation Precautions No active isolations  Medications Medications  sodium chloride flush (NS) 0.9 % injection 3 mL (3 mLs Intravenous Not Given 08/03/19 1834)  potassium chloride (KLOR-CON) packet 40 mEq (has no administration in time range)  traMADol (ULTRAM) tablet 50 mg (has no administration in time range)  carvedilol (COREG) tablet 25 mg (has no administration in time range)  furosemide (LASIX) tablet 20 mg (has no administration in time range)  hydrALAZINE (APRESOLINE) tablet 25 mg (has no administration in time range)  hydrochlorothiazide (MICROZIDE) capsule 12.5 mg (has no administration in time range)  ferrous sulfate tablet 325 mg (has no administration in time range)  calcium carbonate (TUMS - dosed in mg elemental calcium) chewable tablet 200 mg of elemental calcium (has no administration in time range)  ondansetron (ZOFRAN) tablet 4 mg (has no administration in time range)  gabapentin (NEURONTIN) capsule 300 mg  (has no administration in time range)  ipratropium-albuterol (DUONEB) 0.5-2.5 (3) MG/3ML nebulizer solution 3 mL (has no administration in time range)  tiotropium (SPIRIVA) inhalation capsule ([facility] use ONLY) 18 mcg (has no administration in time range)  sodium chloride flush (NS) 0.9 % injection 3 mL (has no administration in time range)  sodium chloride flush (NS) 0.9 % injection 3 mL (has no administration in time range)  0.9 %  sodium chloride infusion (has no administration in time range)  acetaminophen (TYLENOL) tablet 650 mg (has no administration in time range)  ondansetron (ZOFRAN) injection 4 mg (has no administration in time range)  enoxaparin (LOVENOX) injection 40 mg (has no administration in time range)  furosemide (LASIX) injection 40 mg (has no administration in time range)  isosorbide-hydrALAZINE (BIDIL) 20-37.5 MG per tablet 1 tablet (has no administration in time range)  zolpidem (AMBIEN) tablet 5 mg (has no administration in time range)  labetalol (NORMODYNE) injection 20 mg (has no administration in time range)  hydrALAZINE (APRESOLINE) injection 10 mg (has no administration in time range)  morphine 4 MG/ML injection 4 mg (4 mg Intravenous Given 08/03/19 1655)  furosemide (LASIX) injection 80 mg (80 mg Intravenous Given 08/03/19 1820)  hydrALAZINE (APRESOLINE) injection 10 mg (10 mg Intravenous Given 08/03/19 1831)  labetalol (NORMODYNE) injection 20 mg (20 mg Intravenous Given 08/03/19 1920)    Mobility walks with device Low fall risk   Focused Assessments Cardiac Assessment Handoff:    No results found for: CKTOTAL, CKMB, CKMBINDEX, TROPONINI No results found for: DDIMER Does  the Patient currently have chest pain? No      R Recommendations: See Admitting Provider Note  Report given to:   Additional Notes:

## 2019-08-03 NOTE — ED Notes (Signed)
Sister, Hassan Rowan called for update at this time- phone provided to pt.

## 2019-08-03 NOTE — H&P (Signed)
[place] [facility]   PATIENT NAME: Kevin [name]    MR#:  [id number]  DATE OF BIRTH:  1956/07/26  DATE OF ADMISSION:  08/03/2019  PRIMARY CARE PHYSICIAN: Berniece Pap, FNP   REQUESTING/REFERRING PHYSICIAN: Daryel November, MD  CHIEF COMPLAINT:   Chief Complaint  Patient presents with  . Leg Swelling    HISTORY OF PRESENT ILLNESS:  [name]  is a 63 y.o. male with a known history of CHF and hypertension following in the CHF clinic, who presented to the emergency room with acute onset of significantly worsening lower extremity edema which has been going on over the last couple months but got significantly worse over the last several days.  He admits to dyspnea as well as orthopnea and paroxysmal nocturnal dyspnea.  He has been having dyspnea on exertion.  He has been having irritant cough at night without wheezing.  He denied any nausea or vomiting or abdominal pain.  No chest pain or palpitations.  No recent travels or surgeries.  Upon presentation to the emergency room, blood pressure was 152/101 with otherwise normal vital signs. Later on blood pressure was 191/156 and respiratory rate 31. Labs were remarkable for a BNP of 2085 and CBC with anemia that is stable, potassium of 3.6 and magnesium 1.9 and a BUN of 24 with creatinine 1.17. AST was 48 and ALT 53. Portable chest ray showed small right pleural effusion with area of subsegmental atelectasis in the right lower lobe, moderate cardiomegaly and aortic atherosclerosis. UA was unremarkable. EKG showed normal sinus rhythm with a rate of 94 with suspected left atrial enlargement, left axis deviation and Q waves in V1 and V2 as well as T wave inversion in aVL.  The patient was given 80 mg of IV Lasix, 10 mg of IV hydralazine, 20 mg of IV labetalol and 4 mg of IV morphine sulfate. He will be admitted to a telemetry bed for further evaluation and management.   PAST MEDICAL HISTORY:   Past Medical History:   Diagnosis Date  . CHF (congestive heart failure) (HCC)   . Hypertension     PAST SURGICAL HISTORY:  History reviewed. No pertinent surgical history.  Denies any previous surgeries  SOCIAL HISTORY:   Social History   Tobacco Use  . Smoking status: Former Games developer  . Smokeless tobacco: Never Used  Substance Use Topics  . Alcohol use: Never    Frequency: Never    FAMILY HISTORY:  No family history on file.  He denied any familial diseases.  DRUG ALLERGIES:  No Known Allergies  REVIEW OF SYSTEMS:   ROS As per history of present illness. All pertinent systems were reviewed above. Constitutional,  HEENT, cardiovascular, respiratory, GI, GU, musculoskeletal, neuro, psychiatric, endocrine,  integumentary and hematologic systems were reviewed and are otherwise  negative/unremarkable except for positive findings mentioned above in the HPI.   MEDICATIONS AT HOME:   Prior to Admission medications   Medication Sig Start Date End Date Taking? Authorizing Provider  fluticasone furoate-vilanterol (BREO ELLIPTA) 200-25 MCG/INH AEPB Inhale 1 puff into the lungs daily. 07/29/19  Yes Chesley Noon, MD  furosemide (LASIX) 20 MG tablet Take 1 tablet (20 mg total) by mouth 2 (two) times daily for 10 days. 07/29/19 08/08/19 Yes Chesley Noon, MD  hydrALAZINE (APRESOLINE) 25 MG tablet Take 1 tablet (25 mg total) by mouth 3 (three) times daily. 07/29/19 08/28/19 Yes Chesley Noon, MD  hydrochlorothiazide (MICROZIDE) 12.5 MG capsule Take 1 capsule (12.5 mg total)  by mouth daily. 07/29/19 08/28/19 Yes Blake Divine, MD  acetaminophen (TYLENOL) 325 MG tablet Take 650 mg by mouth every 6 (six) hours as needed.    [provider]  albuterol (VENTOLIN HFA) 108 (90 Base) MCG/ACT inhaler Inhale 1-2 puffs into the lungs every 6 (six) hours as needed for wheezing or shortness of breath.    [provider]  calcium carbonate (TUMS - DOSED IN MG ELEMENTAL CALCIUM) 500 MG chewable tablet Chew  1 tablet by mouth daily.    [provider]  carvedilol (COREG) 25 MG tablet Take 25 mg by mouth daily.    [provider]  ferrous sulfate 325 (65 FE) MG tablet Take 325 mg by mouth daily with breakfast.    [provider]  gabapentin (NEURONTIN) 300 MG capsule Take 300 mg by mouth 3 (three) times daily.    [provider]  ipratropium-albuterol (DUONEB) 0.5-2.5 (3) MG/3ML SOLN Take 3 mLs by nebulization every 6 (six) hours as needed.    [provider]  ondansetron (ZOFRAN) 4 MG tablet Take 4 mg by mouth every 8 (eight) hours as needed for nausea or vomiting.    [provider]  tiotropium (SPIRIVA) 18 MCG inhalation capsule Place 18 mcg into inhaler and inhale daily.    [provider]  traMADol (ULTRAM) 50 MG tablet Take 50 mg by mouth every 6 (six) hours as needed.    [provider]      VITAL SIGNS:  Blood pressure (!) 191/156, pulse 95, temperature 98.1 F (36.7 C), temperature source Oral, resp. rate (!) 31, height 5\' 8"  (1.727 m), weight 90.7 kg, SpO2 97 %.  PHYSICAL EXAMINATION:  Physical Exam  GENERAL:  63 y.o.-year-old African-American male patient lying in the bed in mild respiratory distress with mild conversational dyspnea.   EYES: Pupils equal, round, reactive to light and accommodation. No scleral icterus. Extraocular muscles intact.  HEENT: Head atraumatic, normocephalic. Oropharynx and nasopharynx clear.  NECK:  Supple, no jugular venous distention. No thyroid enlargement, no tenderness.  LUNGS: Diminished bibasilar breath sounds with no wheezing, rales,rhonchi or crepitation. No use of accessory muscles of respiration.  CARDIOVASCULAR: Regular rate and rhythm, S1, S2 normal. No murmurs, rubs, or gallops.  ABDOMEN: Soft, nondistended, nontender. Bowel sounds present. No organomegaly or mass.  EXTREMITIES: 3+ bilateral lower extremity pitting with no cyanosis, or clubbing.  NEUROLOGIC: Cranial nerves  II through XII are intact. Muscle strength 5/5 in all extremities. Sensation intact. Gait not checked.  PSYCHIATRIC: The patient is alert and oriented x 3.  Normal affect and good eye contact. SKIN: No obvious rash, lesion, or ulcer.   LABORATORY PANEL:   CBC Recent Labs  Lab 08/03/19 1335  WBC 8.8  HGB 10.7*  HCT 34.4*  PLT 310   ------------------------------------------------------------------------------------------------------------------  Chemistries  Recent Labs  Lab 08/03/19 1335  NA 137  K 3.6  CL 104  CO2 25  GLUCOSE 119*  BUN 24*  CREATININE 1.17  CALCIUM 8.3*  MG 1.9  AST 48*  ALT 53*  ALKPHOS 165*  BILITOT 1.3*   ------------------------------------------------------------------------------------------------------------------  Cardiac Enzymes No results for input(s): TROPONINI in the last 168 hours. ------------------------------------------------------------------------------------------------------------------  RADIOLOGY:  Dg Chest 2 View  Result Date: 08/03/2019 CLINICAL DATA:  63 year old male with history of cough and increasing bilateral lower extremity swelling. EXAM: CHEST - 2 VIEW COMPARISON:  Chest x-ray 07/29/2019. FINDINGS: Small right pleural effusion with probable subsegmental atelectasis in the right lung base. Left lung is clear. No  left pleural effusion. Moderate cardiomegaly. Dilatation of the central pulmonary arteries, concerning for pulmonary arterial hypertension. No evidence of pulmonary edema. Upper mediastinal contours are otherwise within normal limits. Aortic atherosclerosis. IMPRESSION: 1. Small right pleural effusion with areas of subsegmental atelectasis in the right lower lobe. 2. Moderate cardiomegaly. 3. Aortic atherosclerosis. Electronically Signed   By: Trudie Reed M.D.   On: 08/03/2019 13:56      IMPRESSION AND PLAN:   1. Acute on chronic CHF likely diastolic. The patient will be admitted to a telemetry bed.  We'll follow serial troponin I's. We'll continue diuresis with IV Lasix. Cardiology consultation will be obtained as well as a 2D echo. by [facility]. I notified Dr. Shirlee Latch about the patient.  2. Hypertensive urgency. This is likely the culprit for #1. The patient will be continued on his antihypertensives. We'll place him on as needed IV labetalol and hydralazine as well as add p.o. BiDil to his regimen.  3. Elevated AST and ALT. 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 Will follow LFTs with diuresis.  4. COPD. No current exacerbation. Continue him on as needed duo nebs.  5. DVT prophylaxis. Subcutaneous Lovenox.                                                                                                                                                                                                                            All the records are reviewed and case discussed with ED provider. The plan of care was discussed in details with the patient (and family). I answered all questions. The patient agreed to proceed with the above mentioned plan. Further management will depend upon hospital course.   CODE STATUS: Full code  TOTAL TIME TAKING CARE OF THIS PATIENT: 50 minutes.    Hannah Beat M.D on 08/03/2019 at 7:48 PM  [facility]   From 7 PM-7 AM, contact night-coverage [email or web address]  CC: Primary care physician; Berniece Pap, FNP   Note: This dictation was prepared with Dragon dictation along with smaller phrase technology. Any transcriptional errors that result from this process are unintentional.

## 2019-08-03 NOTE — ED Notes (Signed)
Provided pt's sister w/ update w/ persmission from pt.

## 2019-08-03 NOTE — ED Notes (Signed)
Report to [place], RN- pt moving to pod C 33.

## 2019-08-04 ENCOUNTER — Ambulatory Visit: Payer: Self-pay | Admitting: Adult Health

## 2019-08-04 ENCOUNTER — Inpatient Hospital Stay (HOSPITAL_COMMUNITY)
Admit: 2019-08-04 | Discharge: 2019-08-04 | Disposition: A | Discharge status: Home / Self Care | Payer: Self-pay | Attending: Family Medicine | Admitting: Family Medicine

## 2019-08-04 ENCOUNTER — Other Ambulatory Visit: Payer: Self-pay

## 2019-08-04 DIAGNOSIS — I361 Nonrheumatic tricuspid (valve) insufficiency: Secondary | ICD-10-CM

## 2019-08-04 DIAGNOSIS — J441 Chronic obstructive pulmonary disease with (acute) exacerbation: Secondary | ICD-10-CM

## 2019-08-04 DIAGNOSIS — R600 Localized edema: Secondary | ICD-10-CM

## 2019-08-04 DIAGNOSIS — G629 Polyneuropathy, unspecified: Secondary | ICD-10-CM

## 2019-08-04 DIAGNOSIS — I10 Essential (primary) hypertension: Secondary | ICD-10-CM

## 2019-08-04 DIAGNOSIS — I509 Heart failure, unspecified: Secondary | ICD-10-CM

## 2019-08-04 DIAGNOSIS — R7989 Other specified abnormal findings of blood chemistry: Secondary | ICD-10-CM

## 2019-08-04 LAB — BASIC METABOLIC PANEL
Anion gap: 10 (ref 5–15)
BUN: 26 mg/dL — ABNORMAL HIGH (ref 8–23)
CO2: 27 mmol/L (ref 22–32)
Calcium: 8.5 mg/dL — ABNORMAL LOW (ref 8.9–10.3)
Chloride: 102 mmol/L (ref 98–111)
Creatinine, Ser: 1.24 mg/dL (ref 0.61–1.24)
GFR calc Af Amer: >60 mL/min (ref >60)
GFR calc non Af Amer: >60 mL/min (ref >60)
Glucose, Bld: 113 mg/dL — ABNORMAL HIGH (ref 70–99)
Potassium: 4.2 mmol/L (ref 3.5–5.1)
Sodium: 139 mmol/L (ref 135–145)

## 2019-08-04 LAB — CBC WITH DIFFERENTIAL/PLATELET
Abs Immature Granulocytes: 0.02 10*3/uL (ref 0.00–0.07)
Basophils Absolute: 0 10*3/uL (ref 0.0–0.1)
Basophils Relative: 1 %
Eosinophils Absolute: 0.1 10*3/uL (ref 0.0–0.5)
Eosinophils Relative: 1 %
HCT: 35.3 % — ABNORMAL LOW (ref 39.0–52.0)
Hemoglobin: 10.9 g/dL — ABNORMAL LOW (ref 13.0–17.0)
Immature Granulocytes: 0 %
Lymphocytes Relative: 28 %
Lymphs Abs: 2.4 10*3/uL (ref 0.7–4.0)
MCH: 27.9 pg (ref 26.0–34.0)
MCHC: 30.9 g/dL (ref 30.0–36.0)
MCV: 90.3 fL (ref 80.0–100.0)
Monocytes Absolute: 1 10*3/uL (ref 0.1–1.0)
Monocytes Relative: 11 %
Neutro Abs: 5.2 10*3/uL (ref 1.7–7.7)
Neutrophils Relative %: 59 %
Platelets: 314 10*3/uL (ref 150–400)
RBC: 3.91 MIL/uL — ABNORMAL LOW (ref 4.22–5.81)
RDW: 15.9 % — ABNORMAL HIGH (ref 11.5–15.5)
WBC: 8.7 10*3/uL (ref 4.0–10.5)
nRBC: 0 % (ref 0.0–0.2)

## 2019-08-04 LAB — ECHOCARDIOGRAM COMPLETE
Height: 68 in
Weight: 3216 oz

## 2019-08-04 LAB — HIV ANTIBODY (ROUTINE TESTING W REFLEX): HIV Screen 4th Generation wRfx: NONREACTIVE

## 2019-08-04 LAB — HEPATIC FUNCTION PANEL
ALT: 55 U/L — ABNORMAL HIGH (ref 0–44)
AST: 49 U/L — ABNORMAL HIGH (ref 15–41)
Albumin: 3.2 g/dL — ABNORMAL LOW (ref 3.5–5.0)
Alkaline Phosphatase: 163 U/L — ABNORMAL HIGH (ref 38–126)
Bilirubin, Direct: 0.6 mg/dL — ABNORMAL HIGH (ref 0.0–0.2)
Indirect Bilirubin: 1 mg/dL — ABNORMAL HIGH (ref 0.3–0.9)
Total Bilirubin: 1.6 mg/dL — ABNORMAL HIGH (ref 0.3–1.2)
Total Protein: 7.3 g/dL (ref 6.5–8.1)

## 2019-08-04 LAB — TROPONIN I (HIGH SENSITIVITY)
Troponin I (High Sensitivity): 104 ng/L (ref <18)
Troponin I (High Sensitivity): 110 ng/L (ref <18)

## 2019-08-04 LAB — SARS CORONAVIRUS 2 (TAT 6-24 HRS): SARS Coronavirus 2: NEGATIVE

## 2019-08-04 MED ORDER — METHYLPREDNISOLONE SODIUM SUCC 40 MG IJ SOLR
40.0000 mg | Freq: Every day | INTRAMUSCULAR | Status: DC
  Administered 2019-08-04 – 2019-08-09 (×6): 40 mg via INTRAVENOUS
  Filled 2019-08-04 (×6): qty 1

## 2019-08-04 MED ORDER — HYDRALAZINE HCL 25 MG PO TABS
37.5000 mg | ORAL_TABLET | Freq: Three times a day (TID) | ORAL | Status: DC
  Administered 2019-08-04 – 2019-08-07 (×9): 37.5 mg via ORAL
  Filled 2019-08-04 (×9): qty 2

## 2019-08-04 MED ORDER — CARVEDILOL 25 MG PO TABS
25.0000 mg | ORAL_TABLET | Freq: Two times a day (BID) | ORAL | Status: DC
  Administered 2019-08-04 – 2019-08-10 (×11): 25 mg via ORAL
  Filled 2019-08-04 (×11): qty 1

## 2019-08-04 MED ORDER — FLUTICASONE FUROATE-VILANTEROL 200-25 MCG/INH IN AEPB
1.0000 | INHALATION_SPRAY | Freq: Every day | RESPIRATORY_TRACT | Status: DC
  Administered 2019-08-04 – 2019-08-10 (×7): 1 via RESPIRATORY_TRACT
  Filled 2019-08-04: qty 28

## 2019-08-04 MED ORDER — BENZONATATE 100 MG PO CAPS
100.0000 mg | ORAL_CAPSULE | Freq: Three times a day (TID) | ORAL | Status: DC
  Administered 2019-08-04 – 2019-08-10 (×18): 100 mg via ORAL
  Filled 2019-08-04 (×18): qty 1

## 2019-08-04 MED ORDER — FUROSEMIDE 10 MG/ML IJ SOLN
80.0000 mg | Freq: Two times a day (BID) | INTRAMUSCULAR | Status: DC
  Administered 2019-08-04 – 2019-08-08 (×8): 80 mg via INTRAVENOUS
  Filled 2019-08-04 (×8): qty 8

## 2019-08-04 MED ORDER — GUAIFENESIN 100 MG/5ML PO SOLN
5.0000 mL | ORAL | Status: DC | PRN
  Filled 2019-08-04: qty 5

## 2019-08-04 MED ORDER — IPRATROPIUM-ALBUTEROL 0.5-2.5 (3) MG/3ML IN SOLN
3.0000 mL | Freq: Four times a day (QID) | RESPIRATORY_TRACT | Status: DC
  Administered 2019-08-04 – 2019-08-05 (×2): 3 mL via RESPIRATORY_TRACT
  Filled 2019-08-04 (×2): qty 3

## 2019-08-04 MED ORDER — FUROSEMIDE 10 MG/ML IJ SOLN: 80.0000 mg | Freq: Two times a day (BID) | INTRAMUSCULAR | Status: DC

## 2019-08-04 MED ORDER — FUROSEMIDE 10 MG/ML IJ SOLN
60.0000 mg | Freq: Once | INTRAMUSCULAR | Status: AC
  Administered 2019-08-04: 60 mg via INTRAVENOUS
  Filled 2019-08-04: qty 6

## 2019-08-04 NOTE — Plan of Care (Signed)
Nutrition Education Note  RD consulted for nutrition education regarding new onset CHF.  63 y/o male admitted with CHF  RD provided "Low Sodium Nutrition Therapy" handout from the Academy of [employer] and [employer]etics. Reviewed patient's dietary recall. Provided examples on ways to decrease sodium intake in diet. Discouraged intake of processed foods and use of salt shaker. Encouraged fresh fruits and vegetables as well as whole grain sources of carbohydrates to maximize fiber intake.   RD discussed why it is important for patient to adhere to diet recommendations, and emphasized the role of fluids, foods to avoid, and importance of weighing self daily. Teach back method used.  Expect good compliance.  Body mass index is 30.56 kg/m. Pt meets criteria for obesity based on current BMI.  Current diet order is 2g sodium diet, patient is consuming approximately 100% of meals at this time. Labs and medications reviewed. No further nutrition interventions warranted at this time. RD contact information provided. If additional nutrition issues arise, please re-consult RD.   Koleen Distance MS, RD, LDN Pager #- [phone] Office#- [phone] After Hours Pager: [phone]

## 2019-08-04 NOTE — Consult Note (Addendum)
Cardiology Consultation:   Patient ID: Kevin [name] MRN: [id number]; DOB: 11/03/1955  Admit date: 08/03/2019 Date of Consult: 08/04/2019  Primary Care Provider: Berniece PapFlinchum, Michelle S, FNP Primary Cardiologist: new. Ag[place] rounding Primary Electrophysiologist:  None    Patient Profile:   Kevin [name] is a 63 y.o. male with a hx of hypertension, congestive heart failure who is being seen today for the evaluation of fluid overload, CHF at the request of Dr.Mansy  History of Present Illness:   Mr. Kevin [name] 63 year old male with history of hypertension, congestive heart failure, former smoker x50 years, COPD who presents due to 4 months of worsening lower extremity edema and cough.  Patient was diagnosed with congestive heart failure about a year ago and placed on medications.  [place] his physicians are.  He came to [place] 9 months ago to take care of his mother.  Since arrival, there has been some difficulty with refilling his meds or having his meds sent to her pharmacy here.  He states not having/taking his medications for over 6 months now.  He presented to the emergency room about 5 days ago with worsening cough and lower extremity edema.  He was given oral Lasix and discharged.  He states his condition worsened, which caused him to come back to the emergency room.  In the emergency room, chest x-ray showed cardiomegaly with a small right pleural effusion.  EKG showed normal sinus rhythm, possible left atrial enlargement and old septal infarct.  BNP was 2085.  He was given Lasix 80 mg IV x1.  He is net -2 L so far.  Upon my examination, patient noted to be coughing profusely which he states has been going on for the past couple of months.  Heart Pathway Score:     Past Medical History:  Diagnosis Date  . CHF (congestive heart failure) (HCC)   . Hypertension     History reviewed. No pertinent surgical history.   Home Medications:  Prior to  Admission medications   Medication Sig Start Date End Date Taking? Authorizing Provider  acetaminophen (TYLENOL) 325 MG tablet Take 650 mg by mouth every 6 (six) hours as needed for mild pain or fever.    Yes [provider]  albuterol (VENTOLIN HFA) 108 (90 Base) MCG/ACT inhaler Inhale 1-2 puffs into the lungs every 6 (six) hours as needed for wheezing or shortness of breath.   Yes [provider]  calcium carbonate (TUMS - DOSED IN MG ELEMENTAL CALCIUM) 500 MG chewable tablet Chew 1 tablet by mouth daily.   Yes [provider]  carvedilol (COREG) 25 MG tablet Take 25 mg by mouth 2 (two) times daily.    Yes [provider]  fluticasone furoate-vilanterol (BREO ELLIPTA) 200-25 MCG/INH AEPB Inhale 1 puff into the lungs daily. 07/29/19  Yes Chesley NoonJessup, Charles, MD  furosemide (LASIX) 20 MG tablet Take 1 tablet (20 mg total) by mouth 2 (two) times daily for 10 days. 07/29/19 08/08/19 Yes Chesley NoonJessup, Charles, MD  gabapentin (NEURONTIN) 300 MG capsule Take 300 mg by mouth 2 (two) times daily.    Yes [provider]  hydrALAZINE (APRESOLINE) 25 MG tablet Take 1 tablet (25 mg total) by mouth 3 (three) times daily. 07/29/19 08/28/19 Yes Chesley NoonJessup, Charles, MD  hydrochlorothiazide (MICROZIDE) 12.5 MG capsule Take 1 capsule (12.5 mg total) by mouth daily. 07/29/19 08/28/19 Yes Chesley NoonJessup, Charles, MD  ipratropium-albuterol (DUONEB) 0.5-2.5 (3) MG/3ML SOLN Take 3 mLs by nebulization every 6 (six) hours  as needed.   Yes [provider]  ondansetron (ZOFRAN) 4 MG tablet Take 4 mg by mouth every 8 (eight) hours as needed for nausea or vomiting.   Yes [provider]  tiotropium (SPIRIVA) 18 MCG inhalation capsule Place 18 mcg into inhaler and inhale daily.   Yes [provider]  traMADol (ULTRAM) 50 MG tablet Take 25-50 mg by mouth every 6 (six) hours as needed for moderate pain.    Yes [provider]    Inpatient Medications: Scheduled Meds: . calcium  carbonate  1 tablet Oral Daily  . carvedilol  25 mg Oral Daily  . enoxaparin (LOVENOX) injection  40 mg Subcutaneous Q24H  . ferrous sulfate  325 mg Oral Q breakfast  . furosemide  40 mg Intravenous Q12H  . gabapentin  300 mg Oral TID  . hydrALAZINE  25 mg Oral TID  . hydrochlorothiazide  12.5 mg Oral Daily  . isosorbide-hydrALAZINE  1 tablet Oral TID  . sodium chloride flush  3 mL Intravenous Once  . sodium chloride flush  3 mL Intravenous Q12H  . tiotropium  18 mcg Inhalation Daily   Continuous Infusions: . sodium chloride     PRN Meds: sodium chloride, acetaminophen, hydrALAZINE, ipratropium-albuterol, labetalol, ondansetron (ZOFRAN) IV, ondansetron, sodium chloride flush, traMADol, zolpidem  Allergies:   No Known Allergies  Social History:   Social History   Socioeconomic History  . Marital status: Single    Spouse name: Not on file  . Number of children: Not on file  . Years of education: Not on file  . Highest education level: Not on file  Occupational History  . Not on file  Social Needs  . Financial resource strain: Somewhat hard  . Food insecurity    Worry: Often true    Inability: Often true  . Transportation needs    Medical: No    Non-medical: No  Tobacco Use  . Smoking status: Former Games developer  . Smokeless tobacco: Never Used  Substance and Sexual Activity  . Alcohol use: Never    Frequency: Never  . Drug use: Not Currently  . Sexual activity: Not on file  Lifestyle  . Physical activity    Days per week: 7 days    Minutes per session: 60 min  . Stress: Not at all  Relationships  . Social Musician on phone: Three times a week    Gets together: Never    Attends religious service: Never    Active member of club or organization: No    Attends meetings of clubs or organizations: Never    Relationship status: Never married  . Intimate partner violence    Fear of current or ex partner: No    Emotionally abused: No    Physically abused: No     Forced sexual activity: No  Other Topics Concern  . Not on file  Social History Narrative   Patient moved to [place] from [place] in March of this year, to take care of his Mother. She lives in a community managed by her church, "[facility] living [facility]." Patient lives with his Mother, does not drive, and reports he mostly keeps to himself.    Family History:   Patient denies any family history of heart disease.  ROS:  Please see the history of present illness.   All other ROS reviewed and negative.     Physical Exam/Data:   Vitals:   08/04/19 0008 08/04/19 0013 08/04/19 0418 08/04/19  0754  BP:  (!) 147/93 (!) 145/96 (!) 152/119  Pulse:  88 89 81  Resp:  20 20 18   Temp:  97.6 F (36.4 C) 98.3 F (36.8 C) 98.8 F (37.1 C)  TempSrc:  Oral Oral Oral  SpO2:  100% 90% 94%  Weight: 91.3 kg  91.2 kg   Height:        Intake/Output Summary (Last 24 hours) at 08/04/2019 0852 Last data filed at 08/04/2019 0419 Gross per 24 hour  Intake -  Output 2025 ml  Net -2025 ml   Last 3 Weights 08/04/2019 08/04/2019 08/03/2019  Weight (lbs) 201 lb 201 lb 4.8 oz 200 lb  Weight (kg) 91.173 kg 91.309 kg 90.719 kg     Body mass index is 30.56 kg/m.  General:  Well nourished, well developed,mild respiratory distress with frequent cough HEENT: normal Lymph: no adenopathy Neck: no JVD Endocrine:  No thryomegaly Vascular: No carotid bruits; FA pulses 2+ bilaterally without bruits  Cardiac:  normal S1, S2; RRR; no murmur  Lungs: Crackles noted at lung bases Abd: soft, nontender, no hepatomegaly  Ext: 2+ pitting edema Musculoskeletal:  No deformities, BUE and BLE strength normal and equal Skin: warm and dry  Neuro:  CNs 2-12 intact, no focal abnormalities noted Psych:  Normal affect   EKG:  The EKG was personally reviewed and demonstrates: Normal sinus rhythm, possible left atrial enlargement, old septal infarct  Relevant CV Studies: Echo pending  Laboratory Data:   High Sensitivity Troponin:   Recent Labs  Lab 07/29/19 0845 07/29/19 1056 08/04/19 0010 08/04/19 0207  TROPONINIHS 75* 83* 110* 104*     Chemistry Recent Labs  Lab 07/29/19 0845 08/03/19 1335 08/04/19 0207  NA 138 137 139  K 4.3 3.6 4.2  CL 106 104 102  CO2 23 25 27   GLUCOSE 106* 119* 113*  BUN 26* 24* 26*  CREATININE 1.20 1.17 1.24  CALCIUM 8.8* 8.3* 8.5*  GFRNONAA >60 >60 >60  GFRAA >60 >60 >60  ANIONGAP 9 8 10     Recent Labs  Lab 08/03/19 1335 08/04/19 0207  PROT 6.9 7.3  ALBUMIN 3.1* 3.2*  AST 48* 49*  ALT 53* 55*  ALKPHOS 165* 163*  BILITOT 1.3* 1.6*   Hematology Recent Labs  Lab 07/29/19 0845 08/03/19 1335 08/04/19 0207  WBC 6.8 8.8 8.7  RBC 3.67* 3.80* 3.91*  HGB 10.5* 10.7* 10.9*  HCT 33.5* 34.4* 35.3*  MCV 91.3 90.5 90.3  MCH 28.6 28.2 27.9  MCHC 31.3 31.1 30.9  RDW 15.9* 16.0* 15.9*  PLT 288 310 314   BNP Recent Labs  Lab 07/29/19 0845 08/03/19 1335  BNP 2,085.0* 2,085.0*    DDimer No results for input(s): DDIMER in the last 168 hours.   Radiology/Studies:  Dg Chest 2 View  Result Date: 08/03/2019 CLINICAL DATA:  63 year old male with history of cough and increasing bilateral lower extremity swelling. EXAM: CHEST - 2 VIEW COMPARISON:  Chest x-ray 07/29/2019. FINDINGS: Small right pleural effusion with probable subsegmental atelectasis in the right lung base. Left lung is clear. No left pleural effusion. Moderate cardiomegaly. Dilatation of the central pulmonary arteries, concerning for pulmonary arterial hypertension. No evidence of pulmonary edema. Upper mediastinal contours are otherwise within normal limits. Aortic atherosclerosis. IMPRESSION: 1. Small right pleural effusion with areas of subsegmental atelectasis in the right lower lobe. 2. Moderate cardiomegaly. 3. Aortic atherosclerosis. Electronically Signed   By: Vinnie Langton M.D.   On: 08/03/2019 13:56    Assessment and Plan:  63 year old  male with history of hypertension,  heart failure presenting with worsening lower extremity edema and cough.  Symptoms most likely from running out of heart failure medications.  He is net -2 L with 80 mg IV Lasix given in the emergency room.  He is currently coughing very often during my exam  1. congestive heart failure. -Likely from reduced ejection fraction.  We will see what echo shows. -Give additional IV Lasix dose at 60 mg x1 now, continue with 80 mg IV twice daily. -Holding hydralazine, HCTZ to give room for diuresing. -Coreg okay. -Further recommendations pending diuresing and echo results.  2.  Hypertension -Continue Coreg -IV Lasix as above -heart failure meds such as entresto depending on echo results and BP.   Signed, Debbe Odea, MD  08/04/2019 8:52 AM

## 2019-08-04 NOTE — Progress Notes (Signed)
*  PRELIMINARY RESULTS* Echocardiogram 2D Echocardiogram has been performed.  Sherrie Sport 08/04/2019, 1:47 PM

## 2019-08-04 NOTE — Progress Notes (Signed)
Patient ID: Kevin [name], male   DOB: 11/25/1955, 63 y.o.   MRN: [id number] [facility] PROGRESS NOTE  Kevin [name] OLI:[id number] DOB: Feb 27, 1956 DOA: 08/03/2019 PCP: Berniece Pap, FNP  HPI/Subjective: Patient coughing.  Has shortness of breath.  Has sore throat.  Did have nausea vomiting this morning.  Patient states that his legs are very swollen.  He has been gaining weight.  Patient had an episode where he was coughing and then became unresponsive for a second while is in the room.  I gave him a sternal rub and he woke up right away and was able to answer questions.  Objective: Vitals:   08/04/19 0418 08/04/19 0754  BP: (!) 145/96 (!) 152/119  Pulse: 89 81  Resp: 20 18  Temp: 98.3 F (36.8 C) 98.8 F (37.1 C)  SpO2: 90% 94%    Intake/Output Summary (Last 24 hours) at 08/04/2019 1256 Last data filed at 08/04/2019 0419 Gross per 24 hour  Intake -  Output 2025 ml  Net -2025 ml   Filed Weights   08/03/19 1331 08/04/19 0008 08/04/19 0418  Weight: 90.7 kg 91.3 kg 91.2 kg    ROS: Review of Systems  Constitutional: Negative for chills and fever.  Eyes: Negative for blurred vision.  Respiratory: Positive for cough and shortness of breath.   Cardiovascular: Negative for chest pain.  Gastrointestinal: Negative for abdominal pain, constipation, diarrhea, nausea and vomiting.  Genitourinary: Negative for dysuria.  Musculoskeletal: Negative for joint pain.  Neurological: Negative for dizziness and headaches.   Exam: Physical Exam  Constitutional: He is oriented to person, place, and time.  HENT:  Nose: No mucosal edema.  Mouth/Throat: No oropharyngeal exudate or posterior oropharyngeal edema.  Eyes: Pupils are equal, round, and reactive to light. Conjunctivae, EOM and lids are normal.  Neck: Carotid bruit is not present. No edema present. No thyroid mass and no thyromegaly present.  Cardiovascular: S1 normal and S2 normal. Exam reveals no gallop.  No murmur  heard. Pulses:      Dorsalis pedis pulses are 2+ on the right side and 2+ on the left side.  Respiratory: No respiratory distress. He has decreased breath sounds in the right lower field and the left lower field. He has no wheezes. He has no rhonchi. He has no rales.  GI: Soft. Bowel sounds are normal. There is no abdominal tenderness.  Musculoskeletal:     Right ankle: He exhibits swelling.     Left ankle: He exhibits swelling.  Lymphadenopathy:    He has no cervical adenopathy.  Neurological: He is alert and oriented to person, place, and time. No cranial nerve deficit.  Skin: Skin is warm. No rash noted. Nails show no clubbing.  Psychiatric: He has a normal mood and affect.      Data Reviewed: Basic Metabolic Panel: Recent Labs  Lab 07/29/19 0845 08/03/19 1335 08/04/19 0207  NA 138 137 139  K 4.3 3.6 4.2  CL 106 104 102  CO2 23 25 27   GLUCOSE 106* 119* 113*  BUN 26* 24* 26*  CREATININE 1.20 1.17 1.24  CALCIUM 8.8* 8.3* 8.5*  MG  --  1.9  --    Liver Function Tests: Recent Labs  Lab 08/03/19 1335 08/04/19 0207  AST 48* 49*  ALT 53* 55*  ALKPHOS 165* 163*  BILITOT 1.3* 1.6*  PROT 6.9 7.3  ALBUMIN 3.1* 3.2*   CBC: Recent Labs  Lab 07/29/19 0845 08/03/19 1335 08/04/19 0207  WBC 6.8 8.8 8.7  NEUTROABS  --   --  5.2  HGB 10.5* 10.7* 10.9*  HCT 33.5* 34.4* 35.3*  MCV 91.3 90.5 90.3  PLT 288 310 314   BNP (last 3 results) Recent Labs    07/29/19 0845 08/03/19 1335  BNP 2,085.0* 2,085.0*      Recent Results (from the past 240 hour(s))  Novel Coronavirus, NAA (Hosp order, Send-out to Ref Lab; TAT 18-24 hrs     Status: None   Collection Time: 07/29/19 10:55 AM   Specimen: Nasopharyngeal Swab; Respiratory  Result Value Ref Range Status   SARS-CoV-2, NAA NOT DETECTED NOT DETECTED Final    Comment: (NOTE) This nucleic acid amplification test was developed and its performance characteristics determined by [employer]. Nucleic acid  amplification tests include PCR and TMA. This test has not been FDA cleared or approved. This test has been authorized by FDA under an Emergency Use Authorization (EUA). This test is only authorized for the duration of time the declaration that circumstances exist justifying the authorization of the emergency use of in vitro diagnostic tests for detection of SARS-CoV-2 virus and/or diagnosis of COVID-19 infection under section 564(b)(1) of the Act, 21 U.S.C. 277OEU-2(P) (1), unless the authorization is terminated or revoked sooner. When diagnostic testing is negative, the possibility of a false negative result should be considered in the context of a patient's recent exposures and the presence of clinical signs and symptoms consistent with COVID-19. An individual without symptoms of COVID- 19 and who is not shedding SARS-CoV-2 vi rus would expect to have a negative (not detected) result in this assay. Performed At: [facility] [address] [place], [place] [id number] Rush Farmer MD QM:[id number]    Page  Final    Comment: Performed at [facility], [place], [place] [address]  SARS CORONAVIRUS 2 (TAT 6-24 HRS) Nasopharyngeal Nasopharyngeal Swab     Status: None   Collection Time: 08/03/19  7:15 PM   Specimen: Nasopharyngeal Swab  Result Value Ref Range Status   SARS Coronavirus 2 NEGATIVE NEGATIVE Final    Comment: (NOTE) SARS-CoV-2 target nucleic acids are NOT DETECTED. The SARS-CoV-2 RNA is generally detectable in upper and lower respiratory specimens during the acute phase of infection. Negative results do not preclude SARS-CoV-2 infection, do not rule out co-infections with other pathogens, and should not be used as the sole basis for treatment or other patient management decisions. Negative results must be combined with clinical observations, patient history, and epidemiological information. The  expected result is Negative. Fact Sheet for Patients: SugarRoll.be Fact Sheet for Healthcare Providers: https://www.woods-mathews.com/ This test is not yet approved or cleared by the [place] FDA and  has been authorized for detection and/or diagnosis of SARS-CoV-2 by FDA under an Emergency Use Authorization (EUA). This EUA will remain  in effect (meaning this test can be used) for the duration of the COVID-19 declaration under Section 56 4(b)(1) of the Act, 21 U.S.C. section 360bbb-3(b)(1), unless the authorization is terminated or revoked sooner. Performed at [facility], [place] [address], [place],  [address]      Studies: Dg Chest 2 View  Result Date: 08/03/2019 CLINICAL DATA:  63 year old male with history of cough and increasing bilateral lower extremity swelling. EXAM: CHEST - 2 VIEW COMPARISON:  Chest x-ray 07/29/2019. FINDINGS: Small right pleural effusion with probable subsegmental atelectasis in the right lung base. Left lung is clear. No left pleural effusion. Moderate cardiomegaly. Dilatation of the central pulmonary arteries, concerning for pulmonary arterial hypertension. No evidence of  pulmonary edema. Upper mediastinal contours are otherwise within normal limits. Aortic atherosclerosis. IMPRESSION: 1. Small right pleural effusion with areas of subsegmental atelectasis in the right lower lobe. 2. Moderate cardiomegaly. 3. Aortic atherosclerosis. Electronically Signed   By: Trudie Reed M.D.   On: 08/03/2019 13:56    Scheduled Meds: . calcium carbonate  1 tablet Oral Daily  . carvedilol  25 mg Oral Daily  . enoxaparin (LOVENOX) injection  40 mg Subcutaneous Q24H  . ferrous sulfate  325 mg Oral Q breakfast  . furosemide  80 mg Intravenous Q12H  . gabapentin  300 mg Oral TID  . hydrALAZINE  25 mg Oral TID  . sodium chloride flush  3 mL Intravenous Once  . sodium chloride flush  3 mL Intravenous Q12H  .  tiotropium  18 mcg Inhalation Daily   Continuous Infusions: . sodium chloride      Assessment/Plan:  1. Acute on chronic congestive heart failure.  Echocardiogram pending.  Continue diuresis with IV Lasix.  Patient on Coreg. 2. Hypertensive urgency.  Restarted Coreg.  Patient also on hydralazine.  Titrate medications and add medications depending on blood pressure. 3. Elevated liver function test.  Likely secondary to congestive heart failure. 4. Lower extremity edema.  TED hose if they will fit. 5. COPD.  Respiratory status stable.  On Breo Ellipta and Spiriva. 6. Neuropathy on gabapentin  Code Status:     Code Status Orders  (From admission, onward)         Start     Ordered   08/03/19 1943  Full code  Continuous     08/03/19 1948        Code Status History    This patient has a current code status but no historical code status.   Advance Care Planning Activity     Family Communication: Spoke with sister on the phone Disposition Plan: To be determined  Consultants:  Cardiology  Time spent: 28 minutes  Zaidan Kee[employer]

## 2019-08-04 NOTE — Evaluation (Signed)
Physical Therapy Evaluation Patient Details Name: Kevin [name] MRN: [id number] DOB: 02-19-1956 Today's Date: 08/04/2019   History of Present Illness  Kevin [name] is a 63yoM who comes to [facility] after progressive LEE and SOB, pt also with persistent dry cough. Pt has been in [place] from [facility] caring for his mother, but has had difficulty obtaining Rx medications, unmediated for several months. Pt admitted with CHF exacerbation.  Clinical Impression  Pt admitted with above diagnosis. Pt currently with functional limitations due to the deficits listed below (see "PT Problem List"). Upon entry, seated edge of bed, awake and agreeable to participate. The pt is alert and oriented x4, pleasant, conversational, and generally a good historian. Pt has a persistent dry cough throughout visit, worse when mobilizing. Pt generally moving well in bed and rising to standing, but with coughing episode, he has sudden onset collapse and eye rolling which lasts ~5 seconds, pt seated at EOB trunk caught/stabilized by Thereasa Parkin. Pt is assumed to have had a brief vasovagal response from coughing. Pt reports having dizziness after these spells and that this has been going on for several weeks. Since cough is not well controlled, additional OOB mobility is deferred at this time. Pt is moved to supine for rest as requested. MD/RN made aware. Functional mobility assessment demonstrates increased effort/time requirements, poor tolerance, and need for physical assistance, whereas the patient performed these at a higher level of independence PTA. Pt will benefit from skilled PT intervention to increase independence and safety with basic mobility in preparation for discharge to the venue listed below.       Follow Up Recommendations Home health PT    Equipment Recommendations  None recommended by PT    Recommendations for Other Services       Precautions / Restrictions Precautions Precautions: Fall Restrictions Weight Bearing  Restrictions: No      Mobility  Bed Mobility Overal bed mobility: Modified Independent                Transfers Overall transfer level: Modified independent               General transfer comment: able to rise to standing, minimal effort, but coughing spell precludes capacity to continue, as pt has had tussive giddiness for a couple weeks. Pt has a syncopal episode with eye rolling and collapse x 5 sec after this spell, fortunately already in sitting at EOB.  Ambulation/Gait Ambulation/Gait assistance: (deferred as coughing is uncontrolled at this time)              Information systems manager Rankin (Stroke Patients Only)       Balance Overall balance assessment: Needs assistance;No apparent balance deficits (not formally assessed)(tussive vagal down at eval)                                           Pertinent Vitals/Pain Pain Assessment: No/denies pain    Home Living Family/patient expects to be discharged to:: Private residence Living Arrangements: Parent   Type of Home: House(senior community condo) Home Access: Level entry     Home Layout: One level Home Equipment: Environmental consultant - 4 wheels Additional Comments: Pt has a rollator he has used for 2 years, previous for urban AMB and IADL.    Prior Function Level of Independence: Independent  with assistive device(s)               Hand Dominance   Dominant Hand: Right    Extremity/Trunk Assessment   Upper Extremity Assessment Upper Extremity Assessment: Overall WFL for tasks assessed    Lower Extremity Assessment Lower Extremity Assessment: Overall WFL for tasks assessed;Generalized weakness       Communication      Cognition Arousal/Alertness: Awake/alert Behavior During Therapy: WFL for tasks assessed/performed Overall Cognitive Status: Within Functional Limits for tasks assessed                                         General Comments      Exercises     Assessment/Plan    PT Assessment Patient needs continued PT services  PT Problem List Decreased activity tolerance;Decreased mobility       PT Treatment Interventions Balance training;Gait training;Functional mobility training;Therapeutic activities;Therapeutic exercise;Patient/family education    PT Goals (Current goals can be found in the Care Plan section)  Acute Rehab PT Goals Patient Stated Goal: regain strength, improve independence with basic mobility PT Goal Formulation: With patient Time For Goal Achievement: 08/18/19 Potential to Achieve Goals: Good    Frequency Min 2X/week   Barriers to discharge Decreased caregiver support      Co-evaluation               AM-PAC PT "6 Clicks" Mobility  Outcome Measure Help needed turning from your back to your side while in a flat bed without using bedrails?: None Help needed moving from lying on your back to sitting on the side of a flat bed without using bedrails?: None Help needed moving to and from a bed to a chair (including a wheelchair)?: None Help needed standing up from a chair using your arms (e.g., wheelchair or bedside chair)?: None Help needed to walk in hospital room?: Total Help needed climbing 3-5 steps with a railing? : Total 6 Click Score: 18    End of Session   Activity Tolerance: Patient tolerated treatment well;Treatment limited secondary to medical complications (Comment);No increased pain Patient left: in bed;with call bell/phone within reach;with bed alarm set Nurse Communication: (syncopal episode in room during eval associated with tussive giddiness) PT Visit Diagnosis: Muscle weakness (generalized) (M62.81);Other abnormalities of gait and mobility (R26.89);Difficulty in walking, not elsewhere classified (R26.2);Other symptoms and signs involving the nervous system (R29.898);Dizziness and giddiness (R42)    Time: 0350-0938 PT Time Calculation (min) (ACUTE  ONLY): 13 min   Charges:   PT Evaluation $PT Eval Low Complexity: 1 Low          2:33 PM, 08/04/19 Etta Grandchild, PT, DPT Physical Therapist - [facility]  [phone] ([place])    [place] C 08/04/2019, 2:29 PM

## 2019-08-05 DIAGNOSIS — I420 Dilated cardiomyopathy: Secondary | ICD-10-CM

## 2019-08-05 DIAGNOSIS — I160 Hypertensive urgency: Secondary | ICD-10-CM

## 2019-08-05 DIAGNOSIS — I110 Hypertensive heart disease with heart failure: Principal | ICD-10-CM

## 2019-08-05 DIAGNOSIS — R601 Generalized edema: Secondary | ICD-10-CM

## 2019-08-05 DIAGNOSIS — I5043 Acute on chronic combined systolic (congestive) and diastolic (congestive) heart failure: Secondary | ICD-10-CM

## 2019-08-05 DIAGNOSIS — J441 Chronic obstructive pulmonary disease with (acute) exacerbation: Secondary | ICD-10-CM

## 2019-08-05 LAB — BASIC METABOLIC PANEL
Anion gap: 12 (ref 5–15)
BUN: 29 mg/dL — ABNORMAL HIGH (ref 8–23)
CO2: 27 mmol/L (ref 22–32)
Calcium: 8.7 mg/dL — ABNORMAL LOW (ref 8.9–10.3)
Chloride: 102 mmol/L (ref 98–111)
Creatinine, Ser: 1.22 mg/dL (ref 0.61–1.24)
GFR calc Af Amer: >60 mL/min (ref >60)
GFR calc non Af Amer: >60 mL/min (ref >60)
Glucose, Bld: 166 mg/dL — ABNORMAL HIGH (ref 70–99)
Potassium: 3.5 mmol/L (ref 3.5–5.1)
Sodium: 141 mmol/L (ref 135–145)

## 2019-08-05 MED ORDER — IPRATROPIUM-ALBUTEROL 0.5-2.5 (3) MG/3ML IN SOLN: 3.0000 mL | Freq: Four times a day (QID) | RESPIRATORY_TRACT | Status: DC | PRN

## 2019-08-05 MED ORDER — SPIRONOLACTONE 25 MG PO TABS
12.5000 mg | ORAL_TABLET | Freq: Every day | ORAL | Status: DC
  Administered 2019-08-05 – 2019-08-10 (×6): 12.5 mg via ORAL
  Filled 2019-08-05 (×2): qty 0.5
  Filled 2019-08-05 (×2): qty 1
  Filled 2019-08-05 (×2): qty 0.5
  Filled 2019-08-05 (×2): qty 1
  Filled 2019-08-05: qty 0.5
  Filled 2019-08-05 (×2): qty 1
  Filled 2019-08-05: qty 0.5

## 2019-08-05 MED ORDER — POTASSIUM CHLORIDE CRYS ER 20 MEQ PO TBCR
20.0000 meq | EXTENDED_RELEASE_TABLET | Freq: Two times a day (BID) | ORAL | Status: DC
  Administered 2019-08-05 – 2019-08-08 (×7): 20 meq via ORAL
  Filled 2019-08-05 (×7): qty 1

## 2019-08-05 NOTE — Progress Notes (Addendum)
Physical Therapy Treatment Patient Details Name: Kevin [name] MRN: [id number] DOB: [birth year] Today's Date: 08/05/2019    History of Present Illness Deondray Ospina is a 63yoM who comes to [facility] after progressive LEE and SOB, pt also with persistent dry cough. Pt has been in [place] from [facility] caring for his mother, but has had difficulty obtaining Rx medications, unmediated for several months. Pt admitted with CHF exacerbation.    PT Comments    Pt in shower at first attempt, then author returned to room later to find pt in chair. Pt agreeable to participate, reports meds have brought tussive relief, author notes no coughing during visit. Pt requests to don shoes for AMB which proves difficult 2/2 persistent pedal edema Lt>Rt, pt unable to donn the Left shoe without authors assist. Pt requires some assistance to bathroom for pericare and gown change, then AMB into hall and slowly around unit with 4WW. Pt tolerating fairly well, but still endorses some weakness and decreased speed. Pt requests to AMB again later in day, which author reports to RN. NA notified at end of session that telemetry leads had been doffed for showering. Pt continued to progress but remains weak and limited.   Follow Up Recommendations  Home health PT     Equipment Recommendations  None recommended by PT    Recommendations for Other Services       Precautions / Restrictions Precautions Precautions: Fall Restrictions Weight Bearing Restrictions: No    Mobility  Bed Mobility               General bed mobility comments: in chair at arrival  Transfers Overall transfer level: Modified independent Equipment used: None             General transfer comment: use of UE intermittently for ergonomics, but not apparent instability or disequilibrium  Ambulation/Gait Ambulation/Gait assistance: Min guard Gait Distance (Feet): 190 Feet Assistive device: 4-wheeled walker Gait Pattern/deviations: Step-to  pattern Gait velocity: 0.8m/s   General Gait Details: consistently paced, feels still weak compared to baseline but improving, no LOB, safe use of RW; also AMB about the room without assistive device ad lib.   Stairs             Wheelchair Mobility    Modified Rankin (Stroke Patients Only)       Balance Overall balance assessment: Modified Independent;No apparent balance deficits (not formally assessed)                                          Cognition Arousal/Alertness: Awake/alert Behavior During Therapy: WFL for tasks assessed/performed;Impulsive Overall Cognitive Status: Within Functional Limits for tasks assessed                                        Exercises      General Comments        Pertinent Vitals/Pain Pain Assessment: No/denies pain    Home Living                      Prior Function            PT Goals (current goals can now be found in the care plan section) Acute Rehab PT Goals Patient Stated Goal: regain strength, improve independence with basic mobility PT Goal  Formulation: With patient Time For Goal Achievement: 08/18/19 Potential to Achieve Goals: Good Progress towards PT goals: Progressing toward goals    Frequency    Min 2X/week      PT Plan Current plan remains appropriate    Co-evaluation              AM-PAC PT "6 Clicks" Mobility   Outcome Measure  Help needed turning from your back to your side while in a flat bed without using bedrails?: None Help needed moving from lying on your back to sitting on the side of a flat bed without using bedrails?: None Help needed moving to and from a bed to a chair (including a wheelchair)?: None Help needed standing up from a chair using your arms (e.g., wheelchair or bedside chair)?: None Help needed to walk in hospital room?: Total Help needed climbing 3-5 steps with a railing? : Total 6 Click Score: 18    End of Session  Equipment Utilized During Treatment: Gait belt Activity Tolerance: Patient tolerated treatment well;No increased pain;Patient limited by fatigue Patient left: with call bell/phone within reach;in chair Nurse Communication: Mobility status PT Visit Diagnosis: Muscle weakness (generalized) (M62.81);Other abnormalities of gait and mobility (R26.89);Difficulty in walking, not elsewhere classified (R26.2);Other symptoms and signs involving the nervous system (R29.898);Dizziness and giddiness (R42)     Time: 1037-1100 PT Time Calculation (min) (ACUTE ONLY): 23 min  Charges:  $Therapeutic Exercise: 23-37 mins                     12:13 PM, 08/05/19 Etta Grandchild, PT, DPT Physical Therapist - [facility]  [phone] ([place])     [place] C 08/05/2019, 12:09 PM

## 2019-08-05 NOTE — Progress Notes (Signed)
Progress Note  Patient Name: Kevin [name] Date of Encounter: 08/05/2019  Primary Cardiologist: new to [facility]  Subjective   Reports continued shortness of breath, massive leg swelling, foot swelling, pitting Has been out of his medications for months, moved from [facility] March 2020 No insurance Taking care of his mother.  Currently has a neighbor to take care of his mother  Inpatient Medications    Scheduled Meds:  benzonatate  100 mg Oral TID   calcium carbonate  1 tablet Oral Daily   carvedilol  25 mg Oral BID WC   enoxaparin (LOVENOX) injection  40 mg Subcutaneous Q24H   ferrous sulfate  325 mg Oral Q breakfast   fluticasone furoate-vilanterol  1 puff Inhalation Daily   furosemide  80 mg Intravenous BID   gabapentin  300 mg Oral TID   hydrALAZINE  37.5 mg Oral TID   methylPREDNISolone (SOLU-MEDROL) injection  40 mg Intravenous Daily   potassium chloride  20 mEq Oral BID   sodium chloride flush  3 mL Intravenous Once   sodium chloride flush  3 mL Intravenous Q12H   spironolactone  12.5 mg Oral Daily   tiotropium  18 mcg Inhalation Daily   Continuous Infusions:  sodium chloride     PRN Meds: sodium chloride, acetaminophen, guaiFENesin, hydrALAZINE, ipratropium-albuterol, labetalol, ondansetron (ZOFRAN) IV, ondansetron, sodium chloride flush, traMADol, zolpidem   Vital Signs    Vitals:   08/04/19 1940 08/05/19 0356 08/05/19 0716 08/05/19 0804  BP: 97/64 (!) 153/95 (!) 150/106   Pulse: 78 84 76   Resp: 20 20 19    Temp: 97.9 F (36.6 C) 98.3 F (36.8 C) 97.7 F (36.5 C)   TempSrc: Oral Oral Oral   SpO2: 100% 100% 100% 100%  Weight:      Height:        Intake/Output Summary (Last 24 hours) at 08/05/2019 1343 Last data filed at 08/05/2019 1100 Gross per 24 hour  Intake 243 ml  Output 1775 ml  Net -1532 ml   Last 3 Weights 08/04/2019 08/04/2019 08/03/2019  Weight (lbs) 201 lb 201 lb 4.8 oz 200 lb  Weight (kg) 91.173 kg 91.309 kg 90.719 kg     Telemetry    Normal sinus rhythm- Personally Reviewed ECG     - Personally Reviewed  Physical Exam   GEN: No acute distress.   Neck:  JVD 10+ Cardiac: RRR, no murmurs, rubs, or gallops.  Massive 2+ pitting edema to below the knees Respiratory:  Scattered Rales, dullness at the bases 1/3 up GI: Soft, nontender, non-distended  MS: No deformity. Neuro:  Nonfocal  Psych: Normal affect   Labs    High Sensitivity Troponin:   Recent Labs  Lab 07/29/19 0845 07/29/19 1056 08/04/19 0010 08/04/19 0207  TROPONINIHS 75* 83* 110* 104*      Chemistry Recent Labs  Lab 08/03/19 1335 08/04/19 0207 08/05/19 0438  NA 137 139 141  K 3.6 4.2 3.5  CL 104 102 102  CO2 25 27 27   GLUCOSE 119* 113* 166*  BUN 24* 26* 29*  CREATININE 1.17 1.24 1.22  CALCIUM 8.3* 8.5* 8.7*  PROT 6.9 7.3  --   ALBUMIN 3.1* 3.2*  --   AST 48* 49*  --   ALT 53* 55*  --   ALKPHOS 165* 163*  --   BILITOT 1.3* 1.6*  --   GFRNONAA >60 >60 >60  GFRAA >60 >60 >60  ANIONGAP 8 10 12      Hematology Recent Labs  Lab 08/03/19 1335 08/04/19 0207  WBC 8.8 8.7  RBC 3.80* 3.91*  HGB 10.7* 10.9*  HCT 34.4* 35.3*  MCV 90.5 90.3  MCH 28.2 27.9  MCHC 31.1 30.9  RDW 16.0* 15.9*  PLT 310 314    BNP Recent Labs  Lab 08/03/19 1335  BNP 2,085.0*     DDimer No results for input(s): DDIMER in the last 168 hours.   Radiology    DG Chest 2 View  Result Date: 08/03/2019 CLINICAL DATA:  63 year old male with history of cough and increasing bilateral lower extremity swelling. EXAM: CHEST - 2 VIEW COMPARISON:  Chest x-ray 07/29/2019. FINDINGS: Small right pleural effusion with probable subsegmental atelectasis in the right lung base. Left lung is clear. No left pleural effusion. Moderate cardiomegaly. Dilatation of the central pulmonary arteries, concerning for pulmonary arterial hypertension. No evidence of pulmonary edema. Upper mediastinal contours are otherwise within normal limits. Aortic  atherosclerosis. IMPRESSION: 1. Small right pleural effusion with areas of subsegmental atelectasis in the right lower lobe. 2. Moderate cardiomegaly. 3. Aortic atherosclerosis. Electronically Signed   By: Trudie Reedaniel  Entrikin M.D.   On: 08/03/2019 13:56   ECHOCARDIOGRAM COMPLETE  Result Date: 08/04/2019   ECHOCARDIOGRAM REPORT   Patient Name:   Kevin [name] Date of Exam: 08/04/2019 Medical Rec #:  [id number]   Height:       68.0 in Accession #:    [id number]  Weight:       201.0 lb Date of Birth:  12/14/1955    BSA:          2.05 m Patient Age:    63 years    BP:           108/75 mmHg Patient Gender: M           HR:           72 bpm. Exam Location:  [facility] Procedure: 2D Echo, Cardiac Doppler and Color Doppler Indications:     Atrial Fibrillation 427.31  History:         Patient has no prior history of Echocardiogram examinations.                  CHF; Risk Factors:Hypertension.  Sonographer:     Cristela BlueJerry Hege RDCS (AE) Referring Phys:  [id number] Vernetta HoneyJAN A MANSY Diagnosing Phys: Debbe OdeaBrian Agbor-Etang MD IMPRESSIONS  1. Left ventricular ejection fraction, by visual estimation, is 25 to 30%. The left ventricle has severely decreased function. There is moderately increased left ventricular hypertrophy.  2. Left ventricular diastolic parameters are consistent with Grade II diastolic dysfunction (pseudonormalization).  3. Right ventricular volume overload.  4. The left ventricle demonstrates global hypokinesis.  5. Global right ventricle has moderately reduced systolic function.The right ventricular size is mildly enlarged. Right vetricular wall thickness was not assessed.  6. Left atrial size was normal.  7. Right atrial size was moderately dilated.  8. The mitral valve is normal in structure. No evidence of mitral valve regurgitation.  9. The tricuspid valve is normal in structure. Tricuspid valve regurgitation mild-moderate. 10. The aortic valve is tricuspid. Aortic valve regurgitation is not visualized. 11. The pulmonic valve was not  well visualized. Pulmonic valve regurgitation is not visualized. 12. Moderately elevated pulmonary artery systolic pressure. 13. The inferior vena cava is dilated in size with <50% respiratory variability, suggesting right atrial pressure of 15 mmHg. FINDINGS  Left Ventricle: Left ventricular ejection fraction, by visual estimation, is 25 to 30%. The left ventricle has severely decreased function. The left  ventricle demonstrates global hypokinesis. The left ventricular internal cavity size was the left ventricle is normal in size. There is moderately increased left ventricular hypertrophy. The interventricular septum is flattened in diastole ('D' shaped left ventricle), consistent with right ventricular volume overload. Left ventricular diastolic parameters are consistent with Grade II diastolic dysfunction (pseudonormalization). Right Ventricle: The right ventricular size is mildly enlarged. Right vetricular wall thickness was not assessed. Global RV systolic function is has moderately reduced systolic function. The tricuspid regurgitant velocity is 3.08 m/s, and with an assumed  right atrial pressure of 15 mmHg, the estimated right ventricular systolic pressure is moderately elevated at 52.8 mmHg. Left Atrium: Left atrial size was normal in size. Right Atrium: Right atrial size was moderately dilated Pericardium: There is no evidence of pericardial effusion. Mitral Valve: The mitral valve is normal in structure. No evidence of mitral valve regurgitation. Tricuspid Valve: The tricuspid valve is normal in structure. Tricuspid valve regurgitation mild-moderate. Aortic Valve: The aortic valve is tricuspid. Aortic valve regurgitation is not visualized. Aortic valve mean gradient measures 2.0 mmHg. Aortic valve peak gradient measures 3.5 mmHg. Aortic valve area, by VTI measures 2.14 cm. Pulmonic Valve: The pulmonic valve was not well visualized. Pulmonic valve regurgitation is not visualized. Pulmonic regurgitation is  not visualized. Aorta: The aortic root is normal in size and structure. Venous: The inferior vena cava is dilated in size with less than 50% respiratory variability, suggesting right atrial pressure of 15 mmHg. IAS/Shunts: No atrial level shunt detected by color flow Doppler.  LEFT VENTRICLE PLAX 2D LVIDd:         4.61 cm       Diastology LVIDs:         3.73 cm       LV e' lateral:   4.13 cm/s LV PW:         1.56 cm       LV E/e' lateral: 20.9 LV IVS:        1.78 cm       LV e' medial:    3.70 cm/s LVOT diam:     2.00 cm       LV E/e' medial:  23.3 LV SV:         39 ml LV SV Index:   18.21 LVOT Area:     3.14 cm  LV Volumes (MOD) LV area d, A2C:    30.20 cm LV area d, A4C:    32.80 cm LV area s, A2C:    24.70 cm LV area s, A4C:    25.00 cm LV major d, A2C:   7.52 cm LV major d, A4C:   7.17 cm LV major s, A2C:   7.15 cm LV major s, A4C:   6.65 cm LV vol d, MOD A2C: 101.0 ml LV vol d, MOD A4C: 125.0 ml LV vol s, MOD A2C: 71.0 ml LV vol s, MOD A4C: 80.4 ml LV SV MOD A2C:     30.0 ml LV SV MOD A4C:     125.0 ml LV SV MOD BP:      37.1 ml RIGHT VENTRICLE RV Basal diam:  5.78 cm RV S prime:     9.36 cm/s TAPSE (M-mode): 3.9 cm LEFT ATRIUM           Index       RIGHT ATRIUM           Index LA diam:      4.20 cm 2.05 cm/m  RA Area:  23.90 cm LA Vol (A2C): 88.6 ml 43.26 ml/m RA Volume:   76.10 ml  37.16 ml/m LA Vol (A4C): 28.3 ml 13.82 ml/m  AORTIC VALVE                   PULMONIC VALVE AV Area (Vmax):    1.93 cm    PV Vmax:        0.62 m/s AV Area (Vmean):   1.84 cm    PV Peak grad:   1.5 mmHg AV Area (VTI):     2.14 cm    RVOT Peak grad: 2 mmHg AV Vmax:           94.00 cm/s AV Vmean:          63.500 cm/s AV VTI:            0.151 m AV Peak Grad:      3.5 mmHg AV Mean Grad:      2.0 mmHg LVOT Vmax:         57.60 cm/s LVOT Vmean:        37.200 cm/s LVOT VTI:          0.103 m LVOT/AV VTI ratio: 0.68  AORTA Ao Root diam: 3.20 cm MITRAL VALVE                        TRICUSPID VALVE MV Area (PHT): 4.21 cm              TR Peak grad:   37.8 mmHg MV PHT:        52.20 msec           TR Vmax:        308.00 cm/s MV Decel Time: 180 msec MV E velocity: 86.30 cm/s 103 cm/s  SHUNTS MV A velocity: 46.10 cm/s 70.3 cm/s Systemic VTI:  0.10 m MV E/A ratio:  1.87       1.5       Systemic Diam: 2.00 cm  Debbe OdeaBrian Agbor-Etang MD Electronically signed by Debbe OdeaBrian Agbor-Etang MD Signature Date/Time: 08/04/2019/3:18:53 PM    Final     Cardiac Studies   Echocardiogram performed yesterday  1. Left ventricular ejection fraction, by visual estimation, is 25 to 30%. The left ventricle has severely decreased function. There is moderately increased left ventricular hypertrophy.  2. Left ventricular diastolic parameters are consistent with Grade II diastolic dysfunction (pseudonormalization).  3. Right ventricular volume overload.  4. The left ventricle demonstrates global hypokinesis.  5. Global right ventricle has moderately reduced systolic function.The right ventricular size is mildly enlarged. Right vetricular wall thickness was not assessed.  6. Left atrial size was normal.  7. Right atrial size was moderately dilated.  8. The mitral valve is normal in structure. No evidence of mitral valve regurgitation.  9. The tricuspid valve is normal in structure. Tricuspid valve regurgitation mild-moderate. 10. The aortic valve is tricuspid. Aortic valve regurgitation is not visualized. 11. The pulmonic valve was not well visualized. Pulmonic valve regurgitation is not visualized. 12. Moderately elevated pulmonary artery systolic pressure. 13. The inferior vena cava is dilated in size with <50% respiratory variability, suggesting right atrial pressure of 15 mmHg.  Patient Profile     63 year old male with history of hypertension, heart failure presenting with worsening lower extremity edema and cough.  Symptoms most likely from running out of heart failure medications.   Cardiomyopathy ejection fraction 25%  Assessment & Plan      1.  Acute on chronic diastolic and systolic CHF Ejection fraction 25%, presumed to be nonischemic Patient reports stress test in [facility] with no ischemia, Denies ever having a cardiac catheterization before.  We will try to obtain records --Good urine output with Lasix IV 80 twice daily -On hydralazine tab, Lasix, Coreg, spironolactone --Even with the above blood pressure continues to run high this morning We will monitor blood pressure during the day, will likely need to start Entresto twice daily prior to discharge  2.  Hypertension Continue medications as above, will look forward to starting Entresto  Will observe blood pressure through today before starting  3. Dilated cardiomyopathy/hypertensive heart disease with CHF Presumed to be nonischemic Denies alcohol Possibly secondary to chronic poorly controlled hypertension, hypertensive heart disease with CHF    Total encounter time more than 25 minutes  Greater than 50% was spent in counseling and coordination of care with the patient   For questions or updates, please contact [facility] Please consult [email or web address] for contact info under        Signed, Julien Nordmann, MD  08/05/2019, 1:43 PM

## 2019-08-05 NOTE — TOC Initial Note (Signed)
Transition of Care [facility]) - Initial/Assessment Note    Patient Details  Name: Kevin [name] MRN: [id number] Date of Birth: 03-28-1956  Transition of [facility]) CM/SW Contact:    Candie Chroman, LCSW Phone Number: 08/05/2019, 1:18 PM  Clinical Narrative: CSW called patient in the room, introduced role, and explained that PT recommendations would be discussed. Patient agreeable to home health. Patient had insurance from when he lived in [place] but it does not appear to have [place] benefits. Will try to find someone to take him for charity home health PT and RN. [facility] cannot respond to referral until they have a set discharge date and home health orders to review. Patient uses a rollator at home. Emailed Development worker, community to see if he has been screened for Medicaid yet. No further concerns. CSW encouraged patient to contact CSW as needed. CSW will continue to follow patient for support and facilitate return home when stable.                Expected Discharge Plan: [place] Barriers to Discharge: Continued Medical Work up   Patient Goals and CMS Choice   CMS Medicare.gov Compare Post Acute Care list provided to:: Other (Comment Required)(No insurance. Looking for charity home health.)    Expected Discharge Plan and Services Expected Discharge Plan: [place] Choice: [place] arrangements for the past 2 months: Single Family Home(Condo)                                      Prior Living Arrangements/Services Living arrangements for the past 2 months: Single Family Home(Condo) Lives with:: Parents Patient language and need for interpreter reviewed:: Yes Do you feel safe going back to the place where you live?: Yes      Need for Family Participation in Patient Care: Yes (Comment) Care giver support system in place?: Yes (comment) Current home services: DME Criminal Activity/Legal Involvement Pertinent to  Current Situation/Hospitalization: No - Comment as needed  Activities of Daily Living Home Assistive Devices/Equipment: Other (Comment)(four wheeled walker) ADL Screening (condition at time of admission) Patient's cognitive ability adequate to safely complete daily activities?: Yes Is the patient deaf or have difficulty hearing?: No Does the patient have difficulty seeing, even when wearing glasses/contacts?: No Does the patient have difficulty concentrating, remembering, or making decisions?: No Patient able to express need for assistance with ADLs?: Yes Does the patient have difficulty dressing or bathing?: No Independently performs ADLs?: Yes (appropriate for developmental age) Does the patient have difficulty walking or climbing stairs?: Yes Weakness of Legs: Both Weakness of Arms/Hands: None  Permission Sought/Granted Permission sought to share information with : Facility Art therapist granted to share information with : Yes, Verbal Permission Granted     Permission granted to share info w AGENCY: Home Health Agencies        Emotional Assessment Appearance:: Appears stated age Attitude/Demeanor/Rapport: Engaged, Gracious Affect (typically observed): Accepting, Appropriate, Calm, Pleasant Orientation: : Oriented to Self, Oriented to Place, Oriented to  Time, Oriented to Situation Alcohol / Substance Use: Not Applicable Psych Involvement: No (comment)  Admission diagnosis:  Acute on chronic systolic congestive heart failure (HCC) [I50.23] Hypertensive urgency [I16.0] Patient Active Problem List   Diagnosis Date Noted  . Anasarca   . Acute on chronic congestive heart failure ([place])   . Elevated LFTs   .  Lower extremity edema   . COPD with acute exacerbation (HCC)   . Neuropathy   . Hypertensive urgency 08/03/2019   PCP:  Berniece Pap, FNP Pharmacy:   [facility] [place], [place] - [address] [address] [place]  [address] Phone: [phone] Fax: [phone]  [facility] [address] ([address]), [place] - [address] [place]) [place] [address] Phone: [phone] Fax: [phone]     Social Determinants of Health (SDOH) Interventions    Readmission Risk Interventions No flowsheet data found.

## 2019-08-05 NOTE — Progress Notes (Signed)
Patient ID: Kevin [name], male   DOB: 02-Aug-1956, 63 y.o.   MRN: [id number] [facility] PROGRESS NOTE  Kevin [name] MHD:[id number] DOB: Sep 04, 1955 DOA: 08/03/2019 PCP: Berniece Pap, FNP  HPI/Subjective: Patient feeling better today than yesterday.  States when he talks a lot he still coughs.  He states he quit smoking with the pandemic.  Still having leg swelling.  Feeling better than when he came in.  Objective: Vitals:   08/05/19 0716 08/05/19 0804  BP: (!) 150/106   Pulse: 76   Resp: 19   Temp: 97.7 F (36.5 C)   SpO2: 100% 100%    Intake/Output Summary (Last 24 hours) at 08/05/2019 1240 Last data filed at 08/05/2019 1100 Gross per 24 hour  Intake 243 ml  Output 1775 ml  Net -1532 ml   Filed Weights   08/03/19 1331 08/04/19 0008 08/04/19 0418  Weight: 90.7 kg 91.3 kg 91.2 kg    ROS: Review of Systems  Constitutional: Negative for chills and fever.  Eyes: Negative for blurred vision.  Respiratory: Positive for cough and shortness of breath.   Cardiovascular: Negative for chest pain.  Gastrointestinal: Negative for abdominal pain, constipation, diarrhea, nausea and vomiting.  Genitourinary: Negative for dysuria.  Musculoskeletal: Negative for joint pain.  Neurological: Negative for dizziness and headaches.   Exam: Physical Exam  Constitutional: He is oriented to person, place, and time.  HENT:  Nose: No mucosal edema.  Mouth/Throat: No oropharyngeal exudate or posterior oropharyngeal edema.  Eyes: Pupils are equal, round, and reactive to light. Conjunctivae, EOM and lids are normal.  Neck: Carotid bruit is not present. No thyroid mass and no thyromegaly present.  Cardiovascular: S1 normal and S2 normal. Exam reveals no gallop.  No murmur heard. Pulses:      Dorsalis pedis pulses are 2+ on the right side and 2+ on the left side.  Respiratory: No respiratory distress. He has decreased breath sounds in the right lower field and the left lower field. He has  no wheezes. He has no rhonchi. He has no rales.  GI: Soft. Bowel sounds are normal. There is no abdominal tenderness.  Musculoskeletal:     Cervical back: No edema.     Right ankle: Swelling present.     Left ankle: Swelling present.  Lymphadenopathy:    He has no cervical adenopathy.  Neurological: He is alert and oriented to person, place, and time. No cranial nerve deficit.  Skin: Skin is warm. No rash noted. Nails show no clubbing.  Psychiatric: He has a normal mood and affect.      Data Reviewed: Basic Metabolic Panel: Recent Labs  Lab 08/03/19 1335 08/04/19 0207 08/05/19 0438  NA 137 139 141  K 3.6 4.2 3.5  CL 104 102 102  CO2 25 27 27   GLUCOSE 119* 113* 166*  BUN 24* 26* 29*  CREATININE 1.17 1.24 1.22  CALCIUM 8.3* 8.5* 8.7*  MG 1.9  --   --    Liver Function Tests: Recent Labs  Lab 08/03/19 1335 08/04/19 0207  AST 48* 49*  ALT 53* 55*  ALKPHOS 165* 163*  BILITOT 1.3* 1.6*  PROT 6.9 7.3  ALBUMIN 3.1* 3.2*   CBC: Recent Labs  Lab 08/03/19 1335 08/04/19 0207  WBC 8.8 8.7  NEUTROABS  --  5.2  HGB 10.7* 10.9*  HCT 34.4* 35.3*  MCV 90.5 90.3  PLT 310 314   BNP (last 3 results) Recent Labs    07/29/19 0845 08/03/19 1335  BNP 2,085.0* 2,085.0*  Recent Results (from the past 240 hour(s))  Novel Coronavirus, NAA (Hosp order, Send-out to Ref Lab; [name] 18-24 hrs     Status: None   Collection Time: 07/29/19 10:55 AM   Specimen: Nasopharyngeal Swab; Respiratory  Result Value Ref Range Status   SARS-CoV-2, NAA NOT DETECTED NOT DETECTED Final    Comment: (NOTE) This nucleic acid amplification test was developed and its performance characteristics determined by [employer]. Nucleic acid amplification tests include PCR and TMA. This test has not been FDA cleared or approved. This test has been authorized by FDA under an Emergency Use Authorization (EUA). This test is only authorized for the duration of time the declaration  that circumstances exist justifying the authorization of the emergency use of in vitro diagnostic tests for detection of SARS-CoV-2 virus and/or diagnosis of COVID-19 infection under section 564(b)(1) of the Act, 21 U.S.C. 710GYI-9(S) (1), unless the authorization is terminated or revoked sooner. When diagnostic testing is negative, the possibility of a false negative result should be considered in the context of a patient's recent exposures and the presence of clinical signs and symptoms consistent with COVID-19. An individual without symptoms of COVID- 19 and who is not shedding SARS-CoV-2 vi rus would expect to have a negative (not detected) result in this assay. Performed At: [facility] [address] [place], [place] [id number] Rush Farmer MD KK:[id number]    [place]  Final    Comment: Performed at [facility], [place], [place] [address]  SARS CORONAVIRUS 2 ([name] 6-24 HRS) Nasopharyngeal Nasopharyngeal Swab     Status: None   Collection Time: 08/03/19  7:15 PM   Specimen: Nasopharyngeal Swab  Result Value Ref Range Status   SARS Coronavirus 2 NEGATIVE NEGATIVE Final    Comment: (NOTE) SARS-CoV-2 target nucleic acids are NOT DETECTED. The SARS-CoV-2 RNA is generally detectable in upper and lower respiratory specimens during the acute phase of infection. Negative results do not preclude SARS-CoV-2 infection, do not rule out co-infections with other pathogens, and should not be used as the sole basis for treatment or other patient management decisions. Negative results must be combined with clinical observations, patient history, and epidemiological information. The expected result is Negative. Fact Sheet for Patients: SugarRoll.be Fact Sheet for Healthcare Providers: https://www.woods-mathews.com/ This test is not yet approved or cleared by the [place] FDA and  has  been authorized for detection and/or diagnosis of SARS-CoV-2 by FDA under an Emergency Use Authorization (EUA). This EUA will remain  in effect (meaning this test can be used) for the duration of the COVID-19 declaration under Section 56 4(b)(1) of the Act, 21 U.S.C. section 360bbb-3(b)(1), unless the authorization is terminated or revoked sooner. Performed at [facility], [place] [address] [place], [place] [address]      Studies: DG Chest 2 View  Result Date: 08/03/2019 CLINICAL DATA:  63 year old male with history of cough and increasing bilateral lower extremity swelling. EXAM: CHEST - 2 VIEW COMPARISON:  Chest x-ray 07/29/2019. FINDINGS: Small right pleural effusion with probable subsegmental atelectasis in the right lung base. Left lung is clear. No left pleural effusion. Moderate cardiomegaly. Dilatation of the central pulmonary arteries, concerning for pulmonary arterial hypertension. No evidence of pulmonary edema. Upper mediastinal contours are otherwise within normal limits. Aortic atherosclerosis. IMPRESSION: 1. Small right pleural effusion with areas of subsegmental atelectasis in the right lower lobe. 2. Moderate cardiomegaly. 3. Aortic atherosclerosis. Electronically Signed   By: Mauri Brooklyn.D.  On: 08/03/2019 13:56   ECHOCARDIOGRAM COMPLETE  Result Date: 08/04/2019   ECHOCARDIOGRAM REPORT   Patient Name:   Kevin [name] Date of Exam: 08/04/2019 Medical Rec #:  [id number]   Height:       68.0 in Accession #:    [id number]  Weight:       201.0 lb Date of Birth:  1956-02-22    BSA:          2.05 m Patient Age:    63 years    BP:           108/75 mmHg Patient Gender: M           HR:           72 bpm. Exam Location:  [facility] Procedure: 2D Echo, Cardiac Doppler and Color Doppler Indications:     Atrial Fibrillation 427.31  History:         Patient has no prior history of Echocardiogram examinations.                  CHF; Risk Factors:Hypertension.  Sonographer:     [name] RDCS  (AE) Referring Phys:  [id number] Vernetta Honey MANSY Diagnosing Phys: Debbe Odea MD IMPRESSIONS  1. Left ventricular ejection fraction, by visual estimation, is 25 to 30%. The left ventricle has severely decreased function. There is moderately increased left ventricular hypertrophy.  2. Left ventricular diastolic parameters are consistent with Grade II diastolic dysfunction (pseudonormalization).  3. Right ventricular volume overload.  4. The left ventricle demonstrates global hypokinesis.  5. Global right ventricle has moderately reduced systolic function.The right ventricular size is mildly enlarged. Right vetricular wall thickness was not assessed.  6. Left atrial size was normal.  7. Right atrial size was moderately dilated.  8. The mitral valve is normal in structure. No evidence of mitral valve regurgitation.  9. The tricuspid valve is normal in structure. Tricuspid valve regurgitation mild-moderate. 10. The aortic valve is tricuspid. Aortic valve regurgitation is not visualized. 11. The pulmonic valve was not well visualized. Pulmonic valve regurgitation is not visualized. 12. Moderately elevated pulmonary artery systolic pressure. 13. The inferior vena cava is dilated in size with <50% respiratory variability, suggesting right atrial pressure of 15 mmHg. FINDINGS  Left Ventricle: Left ventricular ejection fraction, by visual estimation, is 25 to 30%. The left ventricle has severely decreased function. The left ventricle demonstrates global hypokinesis. The left ventricular internal cavity size was the left ventricle is normal in size. There is moderately increased left ventricular hypertrophy. The interventricular septum is flattened in diastole ('D' shaped left ventricle), consistent with right ventricular volume overload. Left ventricular diastolic parameters are consistent with Grade II diastolic dysfunction (pseudonormalization). Right Ventricle: The right ventricular size is mildly enlarged. Right  vetricular wall thickness was not assessed. Global RV systolic function is has moderately reduced systolic function. The tricuspid regurgitant velocity is 3.08 m/s, and with an assumed  right atrial pressure of 15 mmHg, the estimated right ventricular systolic pressure is moderately elevated at 52.8 mmHg. Left Atrium: Left atrial size was normal in size. Right Atrium: Right atrial size was moderately dilated Pericardium: There is no evidence of pericardial effusion. Mitral Valve: The mitral valve is normal in structure. No evidence of mitral valve regurgitation. Tricuspid Valve: The tricuspid valve is normal in structure. Tricuspid valve regurgitation mild-moderate. Aortic Valve: The aortic valve is tricuspid. Aortic valve regurgitation is not visualized. Aortic valve mean gradient measures 2.0 mmHg. Aortic valve peak gradient measures 3.5 mmHg. Aortic  valve area, by VTI measures 2.14 cm. Pulmonic Valve: The pulmonic valve was not well visualized. Pulmonic valve regurgitation is not visualized. Pulmonic regurgitation is not visualized. Aorta: The aortic root is normal in size and structure. Venous: The inferior vena cava is dilated in size with less than 50% respiratory variability, suggesting right atrial pressure of 15 mmHg. IAS/Shunts: No atrial level shunt detected by color flow Doppler.  LEFT VENTRICLE PLAX 2D LVIDd:         4.61 cm       Diastology LVIDs:         3.73 cm       LV e' lateral:   4.13 cm/s LV PW:         1.56 cm       LV E/e' lateral: 20.9 LV IVS:        1.78 cm       LV e' medial:    3.70 cm/s LVOT diam:     2.00 cm       LV E/e' medial:  23.3 LV SV:         39 ml LV SV Index:   18.21 LVOT Area:     3.14 cm  LV Volumes (MOD) LV area d, A2C:    30.20 cm LV area d, A4C:    32.80 cm LV area s, A2C:    24.70 cm LV area s, A4C:    25.00 cm LV major d, A2C:   7.52 cm LV major d, A4C:   7.17 cm LV major s, A2C:   7.15 cm LV major s, A4C:   6.65 cm LV vol d, MOD A2C: 101.0 ml LV vol d, MOD A4C:  125.0 ml LV vol s, MOD A2C: 71.0 ml LV vol s, MOD A4C: 80.4 ml LV SV MOD A2C:     30.0 ml LV SV MOD A4C:     125.0 ml LV SV MOD BP:      37.1 ml RIGHT VENTRICLE RV Basal diam:  5.78 cm RV S prime:     9.36 cm/s TAPSE (M-mode): 3.9 cm LEFT ATRIUM           Index       RIGHT ATRIUM           Index LA diam:      4.20 cm 2.05 cm/m  RA Area:     23.90 cm LA Vol (A2C): 88.6 ml 43.26 ml/m RA Volume:   76.10 ml  37.16 ml/m LA Vol (A4C): 28.3 ml 13.82 ml/m  AORTIC VALVE                   PULMONIC VALVE AV Area (Vmax):    1.93 cm    PV Vmax:        0.62 m/s AV Area (Vmean):   1.84 cm    PV Peak grad:   1.5 mmHg AV Area (VTI):     2.14 cm    RVOT Peak grad: 2 mmHg AV Vmax:           94.00 cm/s AV Vmean:          63.500 cm/s AV VTI:            0.151 m AV Peak Grad:      3.5 mmHg AV Mean Grad:      2.0 mmHg LVOT Vmax:         57.60 cm/s LVOT Vmean:        37.200 cm/s  LVOT VTI:          0.103 m LVOT/AV VTI ratio: 0.68  AORTA Ao Root diam: 3.20 cm MITRAL VALVE                        TRICUSPID VALVE MV Area (PHT): 4.21 cm             TR Peak grad:   37.8 mmHg MV PHT:        52.20 msec           TR Vmax:        308.00 cm/s MV Decel Time: 180 msec MV E velocity: 86.30 cm/s 103 cm/s  SHUNTS MV A velocity: 46.10 cm/s 70.3 cm/s Systemic VTI:  0.10 m MV E/A ratio:  1.87       1.5       Systemic Diam: 2.00 cm  Debbe OdeaBrian Agbor-Etang MD Electronically signed by Debbe OdeaBrian Agbor-Etang MD Signature Date/Time: 08/04/2019/3:18:53 PM    Final     Scheduled Meds: . benzonatate  100 mg Oral TID  . calcium carbonate  1 tablet Oral Daily  . carvedilol  25 mg Oral BID WC  . enoxaparin (LOVENOX) injection  40 mg Subcutaneous Q24H  . ferrous sulfate  325 mg Oral Q breakfast  . fluticasone furoate-vilanterol  1 puff Inhalation Daily  . furosemide  80 mg Intravenous BID  . gabapentin  300 mg Oral TID  . hydrALAZINE  37.5 mg Oral TID  . methylPREDNISolone (SOLU-MEDROL) injection  40 mg Intravenous Daily  . potassium chloride  20 mEq Oral BID   . sodium chloride flush  3 mL Intravenous Once  . sodium chloride flush  3 mL Intravenous Q12H  . spironolactone  12.5 mg Oral Daily  . tiotropium  18 mcg Inhalation Daily   Continuous Infusions: . sodium chloride      Assessment/Plan:  1. Acute on chronic systolic congestive heart failure with anasarca echocardiogram showing an ejection fraction of 25 to 30%.  Continue diuresis with IV Lasix.  Patient on Coreg.  Spoke with cardiology about adding Entresto versus ACE or ARB.  Add low-dose spironolactone. 2. Hypertensive urgency.  Restarted Coreg.  Patient also on hydralazine.  Blood pressure trending better but last blood pressure reading still elevated. 3. Elevated liver function test.  Likely secondary to congestive heart failure. 4. Lower extremity edema.  Elevate legs while in bed. 5. COPD with bronchospasm yesterday.  Added steroids systemic.  On Breo Ellipta and Spiriva. 6. Neuropathy on gabapentin 7. Hypokalemia replace potassium  Code Status:     Code Status Orders  (From admission, onward)         Start     Ordered   08/03/19 1943  Full code  Continuous     08/03/19 1948        Code Status History    This patient has a current code status but no historical code status.   Advance Care Planning Activity     Family Communication: Spoke with sister on the phone Disposition Plan: Likely will need another day or so in the hospital.  Consultants:  Cardiology  Time spent: 27 minutes  Kevin [name] [employer]  [facility]

## 2019-08-05 NOTE — Plan of Care (Signed)
  Problem: Education: Goal: Ability to demonstrate management of disease process will improve Outcome: Progressing Goal: Ability to verbalize understanding of medication therapies will improve Outcome: Progressing Goal: Individualized Educational Video(s) Outcome: Progressing   Problem: Activity: Goal: Capacity to carry out activities will improve Outcome: Progressing   Problem: Cardiac: Goal: Ability to achieve and maintain adequate cardiopulmonary perfusion will improve Outcome: Progressing   Problem: Education: Goal: Knowledge of General Education information will improve Description: Including pain rating scale, medication(s)/side effects and non-pharmacologic comfort measures Outcome: Progressing   Problem: Health Behavior/Discharge Planning: Goal: Ability to manage health-related needs will improve Outcome: Progressing   Problem: Clinical Measurements: Goal: Ability to maintain clinical measurements within normal limits will improve Outcome: Progressing Goal: Will remain free from infection Outcome: Progressing Goal: Diagnostic test results will improve Outcome: Progressing Goal: Respiratory complications will improve Outcome: Progressing Goal: Cardiovascular complication will be avoided Outcome: Progressing   Problem: Activity: Goal: Risk for activity intolerance will decrease Outcome: Progressing   Problem: Nutrition: Goal: Adequate nutrition will be maintained Outcome: Progressing   Problem: Coping: Goal: Level of anxiety will decrease Outcome: Progressing   

## 2019-08-05 NOTE — Progress Notes (Signed)
Cardiovascular and Pulmonary Nurse Navigator Note:    63 year old with a known history of CHF, COPD and hypertension who presented to the Emergency Room with acute onset of worsening lower extremity edema for over the last couple of months.  Patient also complaint of shortness of breath and dyspnea on exertion.  Patient was hypertensive, RR 31, on presenting to the ER. BNP was 2085.    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     Echocardiogram showing an ejection fraction of 25 to 30%.  Patient on IV lasix.    CHF Education:?? Briefly reviewed definition of heart failure and signs and symptoms of an exacerbation. Potential causes of HF discussed. Explained to patient that HF is a chronic illness which requires self-assessment / self-management along with help from the cardiologist/PCP.?Discussed EF value, his EF and normal EF. Patient's EF was 25-30%. ?  *Reviewed importance of and reason behind checking weight daily in the AM, after using the bathroom, but before getting dressed. Patient does not have a functioning scale.  Informed patient TOC SW would provide patient with a scale prior to him being discharged.    *Reviewed with patient the following information:  *Discussed when to call the Dr= weight gain of >2-3lb overnight of 5lb in a week,  *Discussed yellow zone= call MD: weight gain of >2-3lb overnight of 5lb in a week, increased swelling, increased SOB when lying down, chest discomfort, dizziness, increased fatigue  *Red Zone= call 911: struggle to breath, fainting or near fainting, significant chest pain  *Diet - Reviewed low sodium diet-provided handout of recommended and not recommended foods. Dietitian Consultation for diet education was completed yesterday.    *Discussed fluid intake with patient as well. Patient not currently on a fluid restriction, but  advised no more than 64 ounces of fluid per day.?This RN used the bedside water pitcher to demonstrate this volume.    *Instructed patient to take medications as prescribed for heart failure. Explained briefly why pt is on the medications (either make you feel better, live longer or keep you out of the hospital) and discussed monitoring and side effects.  *Discussed exercise.  Physical Therapy evaluation recommending Home Health PT.  Shared with patient the importance of exercise. Explained to  patient he is a candidate for Cardiac Rehab given his low EF and HF diagnosis. Patient is planning to apply for Medicaid.  If approved for Medicaid, then patient could participate in Cardiac Rehab and the cost of the program would be covered.    *Smoking Cessation- Patient is a FORMER smoker.    *[facility] Heart Failure Clinic - Explained the purpose of the [name]. ?Explained to patient the [name] does not replace PCP nor Cardiologist, but is an additional resource to helping patient manage heart failure at home. Patient has a new patient appointment on 08/12/2019 at 1:00 p.m.  Explained to patient about the  Heart Failure Para-medicine program.  Overview of the program provided.   Explained there is no charge to the patient. Patient agreeable to being followed by the HF paramedic.  Clarisa [name], FNP, in the [facility] [name] informed.    Again, the 5 Steps to Living Better with Heart Failure were reviewed with patient.  Patient thanked me for providing the above information. ?  ? Army Melia, RN, BSN, [facility] [place]  [facility] Cardiac & Pulmonary Rehab Cardiovascular & Pulmonary Nurse Navigator  Direct Line:  [phone] Department Phone #: [phone] Fax: [phone] Email Address: [email or web address]

## 2019-08-06 DIAGNOSIS — I5023 Acute on chronic systolic (congestive) heart failure: Secondary | ICD-10-CM

## 2019-08-06 DIAGNOSIS — R600 Localized edema: Secondary | ICD-10-CM

## 2019-08-06 DIAGNOSIS — R601 Generalized edema: Secondary | ICD-10-CM

## 2019-08-06 LAB — COMPREHENSIVE METABOLIC PANEL
ALT: 40 U/L (ref 0–44)
AST: 27 U/L (ref 15–41)
Albumin: 2.9 g/dL — ABNORMAL LOW (ref 3.5–5.0)
Alkaline Phosphatase: 158 U/L — ABNORMAL HIGH (ref 38–126)
Anion gap: 10 (ref 5–15)
BUN: 31 mg/dL — ABNORMAL HIGH (ref 8–23)
CO2: 29 mmol/L (ref 22–32)
Calcium: 8.5 mg/dL — ABNORMAL LOW (ref 8.9–10.3)
Chloride: 101 mmol/L (ref 98–111)
Creatinine, Ser: 1.22 mg/dL (ref 0.61–1.24)
GFR calc Af Amer: >60 mL/min (ref >60)
GFR calc non Af Amer: >60 mL/min (ref >60)
Glucose, Bld: 141 mg/dL — ABNORMAL HIGH (ref 70–99)
Potassium: 3.6 mmol/L (ref 3.5–5.1)
Sodium: 140 mmol/L (ref 135–145)
Total Bilirubin: 1 mg/dL (ref 0.3–1.2)
Total Protein: 6.8 g/dL (ref 6.5–8.1)

## 2019-08-06 MED ORDER — SACUBITRIL-VALSARTAN 24-26 MG PO TABS
1.0000 | ORAL_TABLET | Freq: Two times a day (BID) | ORAL | Status: DC
  Administered 2019-08-06 – 2019-08-07 (×2): 1 via ORAL
  Filled 2019-08-06 (×2): qty 1

## 2019-08-06 NOTE — Progress Notes (Signed)
Progress Note  Patient Name: Shavar Gorka Date of Encounter: 08/06/2019  Primary Cardiologist: Kate Sable, MD  Subjective   Still w/ lower ext swelling.  No significant dyspnea.  Inpatient Medications    Scheduled Meds: . benzonatate  100 mg Oral TID  . calcium carbonate  1 tablet Oral Daily  . carvedilol  25 mg Oral BID WC  . enoxaparin (LOVENOX) injection  40 mg Subcutaneous Q24H  . ferrous sulfate  325 mg Oral Q breakfast  . fluticasone furoate-vilanterol  1 puff Inhalation Daily  . furosemide  80 mg Intravenous BID  . gabapentin  300 mg Oral TID  . hydrALAZINE  37.5 mg Oral TID  . methylPREDNISolone (SOLU-MEDROL) injection  40 mg Intravenous Daily  . potassium chloride  20 mEq Oral BID  . sodium chloride flush  3 mL Intravenous Once  . sodium chloride flush  3 mL Intravenous Q12H  . spironolactone  12.5 mg Oral Daily  . tiotropium  18 mcg Inhalation Daily   Continuous Infusions: . sodium chloride     PRN Meds: sodium chloride, acetaminophen, guaiFENesin, hydrALAZINE, ipratropium-albuterol, labetalol, ondansetron (ZOFRAN) IV, ondansetron, sodium chloride flush, traMADol, zolpidem   Vital Signs    Vitals:   08/05/19 1544 08/05/19 2024 08/06/19 0400 08/06/19 0748  BP: (!) 144/91 (!) 155/90 (!) 179/89 (!) 156/105  Pulse: 73 71 73 73  Resp: 19 20 20 17   Temp: 97.7 F (36.5 C) 98.3 F (36.8 C) 97.6 F (36.4 C) 98 F (36.7 C)  TempSrc: Oral Oral Oral Oral  SpO2: 100% 100% 100% 100%  Weight:   87.6 kg   Height:        Intake/Output Summary (Last 24 hours) at 08/06/2019 1226 Last data filed at 08/06/2019 0900 Gross per 24 hour  Intake 963 ml  Output 3100 ml  Net -2137 ml   Filed Weights   08/04/19 0008 08/04/19 0418 08/06/19 0400  Weight: 91.3 kg 91.2 kg 87.6 kg    Physical Exam   GEN: Well nourished, well developed, in no acute distress.  HEENT: Grossly normal.  Neck: Supple, mod elev jvd, no carotid bruits, or masses. Cardiac: RRR, no  murmurs, rubs, or gallops. No clubbing, cyanosis. Trace to 1+ ankle edema w 2+ pedal edema.  Radials/DP/PT 2+ and equal bilaterally.  Respiratory:  Respirations regular and unlabored, bibasilar crackles. GI: Soft, nontender, nondistended, BS + x 4. MS: no deformity or atrophy. Skin: warm and dry, no rash. Neuro:  Strength and sensation are intact. Psych: AAOx3.  Normal affect.  Labs    Chemistry Recent Labs  Lab 08/03/19 1335 08/04/19 0207 08/05/19 0438 08/06/19 0517  NA 137 139 141 140  K 3.6 4.2 3.5 3.6  CL 104 102 102 101  CO2 25 27 27 29   GLUCOSE 119* 113* 166* 141*  BUN 24* 26* 29* 31*  CREATININE 1.17 1.24 1.22 1.22  CALCIUM 8.3* 8.5* 8.7* 8.5*  PROT 6.9 7.3  --  6.8  ALBUMIN 3.1* 3.2*  --  2.9*  AST 48* 49*  --  27  ALT 53* 55*  --  40  ALKPHOS 165* 163*  --  158*  BILITOT 1.3* 1.6*  --  1.0  GFRNONAA >60 >60 >60 >60  GFRAA >60 >60 >60 >60  ANIONGAP 8 10 12 10      Hematology Recent Labs  Lab 08/03/19 1335 08/04/19 0207  WBC 8.8 8.7  RBC 3.80* 3.91*  HGB 10.7* 10.9*  HCT 34.4* 35.3*  MCV 90.5 90.3  MCH 28.2 27.9  MCHC 31.1 30.9  RDW 16.0* 15.9*  PLT 310 314    Cardiac Enzymes  Recent Labs  Lab 07/29/19 0845 07/29/19 1056 08/04/19 0010 08/04/19 0207  TROPONINIHS 75* 83* 110* 104*      BNP Recent Labs  Lab 08/03/19 1335  BNP 2,085.0*      Radiology    ---  Telemetry    RSR. 6 beats NSVT - Personally Reviewed  Cardiac Studies   2D Echocardiogram 12.9.2020  1. Left ventricular ejection fraction, by visual estimation, is 25 to 30%. The left ventricle has severely decreased function. There is moderately increased left ventricular hypertrophy.  2. Left ventricular diastolic parameters are consistent with Grade II diastolic dysfunction (pseudonormalization).  3. Right ventricular volume overload.  4. The left ventricle demonstrates global hypokinesis.  5. Global right ventricle has moderately reduced systolic function.The right  ventricular size is mildly enlarged. Right vetricular wall thickness was not assessed.  6. Left atrial size was normal.  7. Right atrial size was moderately dilated.  8. The mitral valve is normal in structure. No evidence of mitral valve regurgitation.  9. The tricuspid valve is normal in structure. Tricuspid valve regurgitation mild-moderate. 10. The aortic valve is tricuspid. Aortic valve regurgitation is not visualized. 11. The pulmonic valve was not well visualized. Pulmonic valve regurgitation is not visualized. 12. Moderately elevated pulmonary artery systolic pressure. 13. The inferior vena cava is dilated in size with <50% respiratory variability, suggesting right atrial pressure of 15 mmHg.   Patient Profile     63 year old male with history of hypertension, and HFrEF, who was admitted 12/8 w/  worsening lower extremity edema and cough in the setting of running out of meds.  Echo this admission w/ 25-30%.  Assessment & Plan    1. 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  Admitted w/ volume overload. EF 25-30% by echo this admission.  He has responded well to IV diureisis.  Minus 2.68L overnight, 5.9L since admission.  Wt down 3.1 Kg since admission. Still volume overloaded.  Renal fxn stable. Cont lasix 80 IV bid. BPs trending 150's to 170's.  Cont carvedilol, spiro, and hydralazine.  I will add entresto.  2. Hypertensive urgency:  BPs still up.  Cont  blocker, hydral, lasix.  As above, I am adding entresto - titrate as necessary.  3.  Abnl LFTs:  Likely 2/2 passive congestion in setting of #1, as numbers improving w/ diuresis.  4. COPD/Bronchospasm:  No active wheezing.  Steroids/nebs/inhalers per IM.  Signed, Nicolasa Ducking, NP  08/06/2019, 12:26 PM    For questions or updates, please contact   Please consult [email or web address] for contact info under Cardiology/STEMI.

## 2019-08-06 NOTE — Progress Notes (Signed)
Patient ID: Meryle Pacifico, male   DOB: [birth year], 63 y.o.   MRN: [id number] [facility] PROGRESS NOTE  Jes [place] OVF:[id number] DOB: September 22, 1955 DOA: 08/03/2019 PCP: No primary care provider on file.  HPI/Subjective: Patient feeling better than when he came in.  Still has a little cough but less than when he came in.  Some shortness of breath.  Legs are still swollen but starting to come down.  Objective: Vitals:   08/06/19 0400 08/06/19 0748  BP: (!) 179/89 (!) 156/105  Pulse: 73 73  Resp: 20 17  Temp: 97.6 F (36.4 C) 98 F (36.7 C)  SpO2: 100% 100%    Intake/Output Summary (Last 24 hours) at 08/06/2019 1416 Last data filed at 08/06/2019 1300 Gross per 24 hour  Intake 963 ml  Output 3750 ml  Net -2787 ml   Filed Weights   08/04/19 0008 08/04/19 0418 08/06/19 0400  Weight: 91.3 kg 91.2 kg 87.6 kg    ROS: Review of Systems  Constitutional: Negative for chills and fever.  Eyes: Negative for blurred vision.  Respiratory: Positive for cough and shortness of breath.   Cardiovascular: Negative for chest pain.  Gastrointestinal: Negative for abdominal pain, constipation, diarrhea, nausea and vomiting.  Genitourinary: Negative for dysuria.  Musculoskeletal: Negative for joint pain.  Neurological: Negative for dizziness and headaches.   Exam: Physical Exam  Constitutional: He is oriented to person, place, and time.  HENT:  Nose: No mucosal edema.  Mouth/Throat: No oropharyngeal exudate or posterior oropharyngeal edema.  Eyes: Pupils are equal, round, and reactive to light. Conjunctivae, EOM and lids are normal.  Neck: Carotid bruit is not present. No thyroid mass and no thyromegaly present.  Cardiovascular: S1 normal and S2 normal. Exam reveals no gallop.  No murmur heard. Pulses:      Dorsalis pedis pulses are 2+ on the right side and 2+ on the left side.  Respiratory: No respiratory distress. He has decreased breath sounds in the right lower field and the left lower  field. He has no wheezes. He has no rhonchi. He has no rales.  GI: Soft. Bowel sounds are normal. There is no abdominal tenderness.  Musculoskeletal:     Cervical back: No edema.     Right ankle: Swelling present.     Left ankle: Swelling present.  Lymphadenopathy:    He has no cervical adenopathy.  Neurological: He is alert and oriented to person, place, and time. No cranial nerve deficit.  Skin: Skin is warm. No rash noted. Nails show no clubbing.  Psychiatric: He has a normal mood and affect.      Data Reviewed: Basic Metabolic Panel: Recent Labs  Lab 08/03/19 1335 08/04/19 0207 08/05/19 0438 08/06/19 0517  NA 137 139 141 140  K 3.6 4.2 3.5 3.6  CL 104 102 102 101  CO2 25 27 27 29   GLUCOSE 119* 113* 166* 141*  BUN 24* 26* 29* 31*  CREATININE 1.17 1.24 1.22 1.22  CALCIUM 8.3* 8.5* 8.7* 8.5*  MG 1.9  --   --   --    Liver Function Tests: Recent Labs  Lab 08/03/19 1335 08/04/19 0207 08/06/19 0517  AST 48* 49* 27  ALT 53* 55* 40  ALKPHOS 165* 163* 158*  BILITOT 1.3* 1.6* 1.0  PROT 6.9 7.3 6.8  ALBUMIN 3.1* 3.2* 2.9*   CBC: Recent Labs  Lab 08/03/19 1335 08/04/19 0207  WBC 8.8 8.7  NEUTROABS  --  5.2  HGB 10.7* 10.9*  HCT 34.4* 35.3*  MCV  90.5 90.3  PLT 310 314   BNP (last 3 results) Recent Labs    07/29/19 0845 08/03/19 1335  BNP 2,085.0* 2,085.0*      Recent Results (from the past 240 hour(s))  Novel Coronavirus, NAA (Hosp order, Send-out to Ref Lab; TAT 18-24 hrs     Status: None   Collection Time: 07/29/19 10:55 AM   Specimen: Nasopharyngeal Swab; Respiratory  Result Value Ref Range Status   SARS-CoV-2, NAA NOT DETECTED NOT DETECTED Final    Comment: (NOTE) This nucleic acid amplification test was developed and its performance characteristics determined by [employer]. Nucleic acid amplification tests include PCR and TMA. This test has not been FDA cleared or approved. This test has been authorized by FDA under an Emergency Use  Authorization (EUA). This test is only authorized for the duration of time the declaration that circumstances exist justifying the authorization of the emergency use of in vitro diagnostic tests for detection of SARS-CoV-2 virus and/or diagnosis of COVID-19 infection under section 564(b)(1) of the Act, 21 U.S.C. 448JEH-6(D) (1), unless the authorization is terminated or revoked sooner. When diagnostic testing is negative, the possibility of a false negative result should be considered in the context of a patient's recent exposures and the presence of clinical signs and symptoms consistent with COVID-19. An individual without symptoms of COVID- 19 and who is not shedding SARS-CoV-2 vi rus would expect to have a negative (not detected) result in this assay. Performed At: [facility] [address] [place], [place] [id number] Rush Farmer MD CH:[id number]    [place]  Final    Comment: Performed at [facility], [place], [place] [address]  SARS CORONAVIRUS 2 (TAT 6-24 HRS) Nasopharyngeal Nasopharyngeal Swab     Status: None   Collection Time: 08/03/19  7:15 PM   Specimen: Nasopharyngeal Swab  Result Value Ref Range Status   SARS Coronavirus 2 NEGATIVE NEGATIVE Final    Comment: (NOTE) SARS-CoV-2 target nucleic acids are NOT DETECTED. The SARS-CoV-2 RNA is generally detectable in upper and lower respiratory specimens during the acute phase of infection. Negative results do not preclude SARS-CoV-2 infection, do not rule out co-infections with other pathogens, and should not be used as the sole basis for treatment or other patient management decisions. Negative results must be combined with clinical observations, patient history, and epidemiological information. The expected result is Negative. Fact Sheet for Patients: SugarRoll.be Fact Sheet for Healthcare  Providers: https://www.woods-mathews.com/ This test is not yet approved or cleared by the [place] FDA and  has been authorized for detection and/or diagnosis of SARS-CoV-2 by FDA under an Emergency Use Authorization (EUA). This EUA will remain  in effect (meaning this test can be used) for the duration of the COVID-19 declaration under Section 56 4(b)(1) of the Act, 21 U.S.C. section 360bbb-3(b)(1), unless the authorization is terminated or revoked sooner. Performed at [facility], [place] [address], [place], [place] [address]       Scheduled Meds: . benzonatate  100 mg Oral TID  . calcium carbonate  1 tablet Oral Daily  . carvedilol  25 mg Oral BID WC  . enoxaparin (LOVENOX) injection  40 mg Subcutaneous Q24H  . ferrous sulfate  325 mg Oral Q breakfast  . fluticasone furoate-vilanterol  1 puff Inhalation Daily  . furosemide  80 mg Intravenous BID  . gabapentin  300 mg Oral TID  . hydrALAZINE  37.5 mg Oral TID  . methylPREDNISolone (SOLU-MEDROL) injection  40 mg Intravenous Daily  . potassium chloride  20 mEq Oral BID  . sacubitril-valsartan  1 tablet Oral BID  . sodium chloride flush  3 mL Intravenous Once  . sodium chloride flush  3 mL Intravenous Q12H  . spironolactone  12.5 mg Oral Daily  . tiotropium  18 mcg Inhalation Daily   Continuous Infusions: . sodium chloride      Assessment/Plan:  1. Acute on chronic systolic congestive heart failure with anasarca echocardiogram showing an ejection fraction of 25 to 30%.  Continue diuresis with IV Lasix.  Patient on Coreg.  Started on low-dose Entresto by cardiology.  Continue low-dose spironolactone. 2. Hypertensive urgency.  Blood pressure fluctuating.  Entresto should help lower the blood pressure.  Patient also on Coreg, hydralazine and spironolactone. 3. Elevated liver function test.  Likely secondary to congestive heart failure. 4. Lower extremity edema.  Elevate legs while in bed.  TED hose. 5. COPD  with bronchospasm yesterday.  Continue 5 days of Solu-Medrol on Breo Ellipta and Spiriva. 6. Neuropathy on gabapentin 7. Hypokalemia replace potassium 8. Cough syncope the other day.  Patient not coughing as much at this point  Code Status:     Code Status Orders  (From admission, onward)         Start     Ordered   08/03/19 1943  Full code  Continuous     08/03/19 1948        Code Status History    This patient has a current code status but no historical code status.   Advance Care Planning Activity     Family Communication: Left message for sister on the phone Disposition Plan: As per cardiology continue IV diuresis.  Consultants:  Cardiology  Time spent: 26 minutes  Kimmie Bergg[employer]

## 2019-08-06 NOTE — Progress Notes (Addendum)
Cardiovascular and Pulmonary Nurse Navigator Note:    Spoke with patient over the phone and he reported he has not received the booklet "Living Better with Heart Failure."  Followed up with patient's assigned nurse and asked her to make sure patient has the Heart Failure booklet and has watched the videos.    Spoke with patient briefly about our Virtual Cardiac Rehab program.  Patient gave verbal consent to participate in the program upon discharge.  Explained to patient this would not interfere with any home health care he will be receiving. Also, informed patient that Virtual Cardiac Rehab is totally free and there are no charges at all for this service.  Patient verbalized understanding.    Confirm Consent - "In the setting of the current Covid19 crisis, you are scheduled tojoin our "At Home" [facility]  Cardiac or Pulmonary  Rehab program . Just as we do withmany in-gym visits, in order for you to participate in this program, we must obtain consent.  If you'd like, I can send this to your mychart (if signed up) or email for you to review.  Otherwise, I can obtain your verbal consent now.  By agreeing to a Cardiac or Pulmonary Rehab Telehealth visit, we'd like you to understand that the technology does not allow for your Cardiac or Pulmonary Rehab team member to perform a physical assessment, and thus may limit their ability to fully assess your ability to perform exercise programs. If your provider identifies any concerns that need to be evaluated in person, we will make arrangements to do so.  Finally, though the technology is pretty good, we cannot assure that it will always work on either your or our end and we cannot ensure that we have a secure connection.  Cardiac and Pulmonary Rehab Telehealth visits and "At Home" cardiac and pulmonary rehab are provided at no cost to you. Are you willing to proceed?" STAFF: Did the patient verbally acknowledge consent to telehealth visit?  YES  Will continue to follow  patient while hospitalized.    Roanna Epley, RN, BSN, [facility]  Cardiovascular & Pulmonary Nurse Navigator  Direct Line: [phone]  Department Phone #: [phone] Fax: [phone]  Email Address: [email or web address]

## 2019-08-06 NOTE — Plan of Care (Signed)
  Problem: Education: Goal: Ability to demonstrate management of disease process will improve Outcome: Progressing Goal: Ability to verbalize understanding of medication therapies will improve Outcome: Progressing Goal: Individualized Educational Video(s) Outcome: Progressing   Problem: Activity: Goal: Capacity to carry out activities will improve Outcome: Progressing   Problem: Cardiac: Goal: Ability to achieve and maintain adequate cardiopulmonary perfusion will improve Outcome: Progressing   Problem: Education: Goal: Knowledge of General Education information will improve Description: Including pain rating scale, medication(s)/side effects and non-pharmacologic comfort measures Outcome: Progressing   Problem: Health Behavior/Discharge Planning: Goal: Ability to manage health-related needs will improve Outcome: Progressing   Problem: Clinical Measurements: Goal: Ability to maintain clinical measurements within normal limits will improve Outcome: Progressing Goal: Will remain free from infection Outcome: Progressing Goal: Diagnostic test results will improve Outcome: Progressing Goal: Respiratory complications will improve Outcome: Progressing Goal: Cardiovascular complication will be avoided Outcome: Progressing   Problem: Activity: Goal: Risk for activity intolerance will decrease Outcome: Progressing   Problem: Nutrition: Goal: Adequate nutrition will be maintained Outcome: Progressing   Problem: Coping: Goal: Level of anxiety will decrease Outcome: Progressing   

## 2019-08-07 ENCOUNTER — Inpatient Hospital Stay: Payer: Self-pay

## 2019-08-07 LAB — GLUCOSE, CAPILLARY
Glucose-Capillary: 309 mg/dL — ABNORMAL HIGH (ref 70–99)
Glucose-Capillary: 231 mg/dL — ABNORMAL HIGH (ref 70–99)

## 2019-08-07 LAB — BASIC METABOLIC PANEL
Anion gap: 14 (ref 5–15)
BUN: 30 mg/dL — ABNORMAL HIGH (ref 8–23)
CO2: 28 mmol/L (ref 22–32)
Calcium: 8.3 mg/dL — ABNORMAL LOW (ref 8.9–10.3)
Chloride: 98 mmol/L (ref 98–111)
Creatinine, Ser: 1.2 mg/dL (ref 0.61–1.24)
GFR calc Af Amer: >60 mL/min (ref >60)
GFR calc non Af Amer: >60 mL/min (ref >60)
Glucose, Bld: 187 mg/dL — ABNORMAL HIGH (ref 70–99)
Potassium: 3.7 mmol/L (ref 3.5–5.1)
Sodium: 140 mmol/L (ref 135–145)

## 2019-08-07 LAB — HEMOGLOBIN A1C
Hgb A1c MFr Bld: 7.2 % — ABNORMAL HIGH (ref 4.8–5.6)
Mean Plasma Glucose: 159.94 mg/dL

## 2019-08-07 MED ORDER — INSULIN ASPART 100 UNIT/ML ~~LOC~~ SOLN
0.0000 [IU] | Freq: Three times a day (TID) | SUBCUTANEOUS | Status: DC
  Administered 2019-08-07: 4 [IU] via SUBCUTANEOUS
  Administered 2019-08-08: 1 [IU] via SUBCUTANEOUS
  Administered 2019-08-08 (×2): 2 [IU] via SUBCUTANEOUS
  Administered 2019-08-09: 1 [IU] via SUBCUTANEOUS
  Administered 2019-08-09 (×2): 4 [IU] via SUBCUTANEOUS
  Filled 2019-08-07 (×7): qty 1

## 2019-08-07 MED ORDER — INSULIN ASPART 100 UNIT/ML ~~LOC~~ SOLN
0.0000 [IU] | Freq: Every day | SUBCUTANEOUS | Status: DC
  Administered 2019-08-07: 2 [IU] via SUBCUTANEOUS
  Administered 2019-08-08: 4 [IU] via SUBCUTANEOUS
  Administered 2019-08-09: 5 [IU] via SUBCUTANEOUS
  Filled 2019-08-07 (×3): qty 1

## 2019-08-07 MED ORDER — SACUBITRIL-VALSARTAN 49-51 MG PO TABS
1.0000 | ORAL_TABLET | Freq: Two times a day (BID) | ORAL | Status: DC
  Administered 2019-08-07 – 2019-08-08 (×3): 1 via ORAL
  Filled 2019-08-07 (×4): qty 1

## 2019-08-07 MED ORDER — HYDRALAZINE HCL 25 MG PO TABS
25.0000 mg | ORAL_TABLET | Freq: Three times a day (TID) | ORAL | Status: DC
  Administered 2019-08-07 – 2019-08-08 (×3): 25 mg via ORAL
  Filled 2019-08-07 (×3): qty 1

## 2019-08-07 NOTE — Progress Notes (Signed)
Progress Note  Patient Name: Kevin [name] Date of Encounter: 08/07/2019  Primary Cardiologist: Debbe Odea, MD  Subjective   Stilll with SOB   ANkles still swollen Pt says he followed Low NA diet at home  Ran out of meds   Inpatient Medications    Scheduled Meds: . benzonatate  100 mg Oral TID  . calcium carbonate  1 tablet Oral Daily  . carvedilol  25 mg Oral BID WC  . enoxaparin (LOVENOX) injection  40 mg Subcutaneous Q24H  . ferrous sulfate  325 mg Oral Q breakfast  . fluticasone furoate-vilanterol  1 puff Inhalation Daily  . furosemide  80 mg Intravenous BID  . gabapentin  300 mg Oral TID  . hydrALAZINE  37.5 mg Oral TID  . methylPREDNISolone (SOLU-MEDROL) injection  40 mg Intravenous Daily  . potassium chloride  20 mEq Oral BID  . sacubitril-valsartan  1 tablet Oral BID  . sodium chloride flush  3 mL Intravenous Once  . sodium chloride flush  3 mL Intravenous Q12H  . spironolactone  12.5 mg Oral Daily  . tiotropium  18 mcg Inhalation Daily   Continuous Infusions: . sodium chloride     PRN Meds: sodium chloride, acetaminophen, guaiFENesin, hydrALAZINE, ipratropium-albuterol, labetalol, ondansetron (ZOFRAN) IV, ondansetron, sodium chloride flush, traMADol, zolpidem   Vital Signs    Vitals:   08/06/19 1510 08/06/19 2019 08/07/19 0403 08/07/19 0714  BP: (!) 152/101 131/85 122/75 131/81  Pulse: 65 66  70  Resp: 17 20 20 19   Temp: 98.2 F (36.8 C) 97.9 F (36.6 C) 97.8 F (36.6 C) 97.8 F (36.6 C)  TempSrc: Oral Oral Oral Oral  SpO2: 100% 100% 98% 98%  Weight:   86.1 kg   Height:        Intake/Output Summary (Last 24 hours) at 08/07/2019 1000 Last data filed at 08/07/2019 0904 Gross per 24 hour  Intake 483 ml  Output 1125 ml  Net -642 ml   NEt neg 6.6 L  Filed Weights   08/04/19 0418 08/06/19 0400 08/07/19 0403  Weight: 91.2 kg 87.6 kg 86.1 kg    Physical Exam   GEN: Well nourished, well developed, in no acute distress.  HEENT: Grossly  normal.  Neck: JVP is increased   Cardiac: RRR, no murmurs, rubs, or gallops. No clubbing, cyanosis.  1+ ankle edema w 1-2+ pedal edema.  Radials/DP/PT 2+ and equal bilaterally.  Respiratory:  Respirations regular and unlabored,  No rales   GI: Soft, Mild RU! Tenderness  nondistended, BS + x 4.  MS: no deformity or atrophy. Skin: warm and dry, no rash. Neuro:  Strength and sensation are intact. Psych: AAOx3.  Normal affect.  Labs    Chemistry Recent Labs  Lab 08/03/19 1335 08/04/19 0207 08/05/19 0438 08/06/19 0517 08/07/19 0422  NA 137 139 141 140 140  K 3.6 4.2 3.5 3.6 3.7  CL 104 102 102 101 98  CO2 25 27 27 29 28   GLUCOSE 119* 113* 166* 141* 187*  BUN 24* 26* 29* 31* 30*  CREATININE 1.17 1.24 1.22 1.22 1.20  CALCIUM 8.3* 8.5* 8.7* 8.5* 8.3*  PROT 6.9 7.3  --  6.8  --   ALBUMIN 3.1* 3.2*  --  2.9*  --   AST 48* 49*  --  27  --   ALT 53* 55*  --  40  --   ALKPHOS 165* 163*  --  158*  --   BILITOT 1.3* 1.6*  --  1.0  --  GFRNONAA >60 >60 >60 >60 >60  GFRAA >60 >60 >60 >60 >60  ANIONGAP 8 10 12 10 14      Hematology Recent Labs  Lab 08/03/19 1335 08/04/19 0207  WBC 8.8 8.7  RBC 3.80* 3.91*  HGB 10.7* 10.9*  HCT 34.4* 35.3*  MCV 90.5 90.3  MCH 28.2 27.9  MCHC 31.1 30.9  RDW 16.0* 15.9*  PLT 310 314    Cardiac Enzymes  Recent Labs  Lab 07/29/19 0845 07/29/19 1056 08/04/19 0010 08/04/19 0207  TROPONINIHS 75* 83* 110* 104*      BNP Recent Labs  Lab 08/03/19 1335  BNP 2,085.0*      Radiology    ---  Telemetry    RSR. 20  beats NSVT - Personally Reviewed  Cardiac Studies   2D Echocardiogram 12.9.2020  1. Left ventricular ejection fraction, by visual estimation, is 25 to 30%. The left ventricle has severely decreased function. There is moderately increased left ventricular hypertrophy.  2. Left ventricular diastolic parameters are consistent with Grade II diastolic dysfunction (pseudonormalization).  3. Right ventricular volume overload.   4. The left ventricle demonstrates global hypokinesis.  5. Global right ventricle has moderately reduced systolic function.The right ventricular size is mildly enlarged. Right vetricular wall thickness was not assessed.  6. Left atrial size was normal.  7. Right atrial size was moderately dilated.  8. The mitral valve is normal in structure. No evidence of mitral valve regurgitation.  9. The tricuspid valve is normal in structure. Tricuspid valve regurgitation mild-moderate. 10. The aortic valve is tricuspid. Aortic valve regurgitation is not visualized. 11. The pulmonic valve was not well visualized. Pulmonic valve regurgitation is not visualized. 12. Moderately elevated pulmonary artery systolic pressure. 13. The inferior vena cava is dilated in size with <50% respiratory variability, suggesting right atrial pressure of 15 mmHg.   Patient Profile     63 year old male with history of hypertension, and HFrEF, who was admitted 12/8 w/  worsening lower extremity edema and cough in the setting of running out of meds.  Echo this admission w/ 25-30%.  Assessment & Plan    1. 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  He actually says he had cardiac catheterization in AUg 2019  Admitted w/ volume overload. EF 25-30% by echo this admission.   He is diuresing but still has SOB and evid of volume increase   + cough   COntinue IV diuresis   Renal funciton is holding   REcords appear to be requested   2. Hypertensive urgency:    BP is improved.  COntinue current medicine  I would increase Entresto and back off on hydralazine    3.  Abnl LFTs:  Likely 2/2 passive congestion in setting of #1, as numbers improving w/ diuresis.  4. COPD/Bronchospasm:  Steroids/nebs/inhalers per IM. Pulmonary exam improving  SOme may be fluid   Signed, Dorris Carnes, MD  08/07/2019, 10:00 AM    For questions or updates, please contact   Please consult  [email or web address] for contact info under Cardiology/STEMI.

## 2019-08-07 NOTE — Progress Notes (Signed)
Patient ID: Kevin [name], male   DOB: Jul 15, 1956, 63 y.o.   MRN: [id number] [facility] PROGRESS NOTE  [facility] TLX:[id number] DOB: 17-Jul-1956 DOA: 08/03/2019 PCP: No primary care provider on file.  HPI/Subjective: Patient still having cough especially if he talks a lot.  His breathing is better.  Appetite is good.  Objective: Vitals:   08/07/19 0403 08/07/19 0714  BP: 122/75 131/81  Pulse:  70  Resp: 20 19  Temp: 97.8 F (36.6 C) 97.8 F (36.6 C)  SpO2: 98% 98%    Intake/Output Summary (Last 24 hours) at 08/07/2019 1458 Last data filed at 08/07/2019 1423 Gross per 24 hour  Intake 243 ml  Output 1025 ml  Net -782 ml   Filed Weights   08/04/19 0418 08/06/19 0400 08/07/19 0403  Weight: 91.2 kg 87.6 kg 86.1 kg    ROS: Review of Systems  Constitutional: Negative for chills and fever.  Eyes: Negative for blurred vision.  Respiratory: Positive for cough and shortness of breath.   Cardiovascular: Negative for chest pain.  Gastrointestinal: Negative for abdominal pain, constipation, diarrhea, nausea and vomiting.  Genitourinary: Negative for dysuria.  Musculoskeletal: Negative for joint pain.  Neurological: Negative for dizziness and headaches.   Exam: Physical Exam  Constitutional: He is oriented to person, place, and time.  HENT:  Nose: No mucosal edema.  Mouth/Throat: No oropharyngeal exudate or posterior oropharyngeal edema.  Eyes: Pupils are equal, round, and reactive to light. Conjunctivae, EOM and lids are normal.  Neck: Carotid bruit is not present. No thyroid mass and no thyromegaly present.  Cardiovascular: S1 normal and S2 normal. Exam reveals no gallop.  No murmur heard. Pulses:      Dorsalis pedis pulses are 2+ on the right side and 2+ on the left side.  Respiratory: No respiratory distress. He has decreased breath sounds in the right lower field and the left lower field. He has no wheezes. He has no rhonchi. He has no rales.  GI: Soft. Bowel sounds are  normal. There is no abdominal tenderness.  Musculoskeletal:     Cervical back: No edema.     Right ankle: Swelling present.     Left ankle: Swelling present.  Lymphadenopathy:    He has no cervical adenopathy.  Neurological: He is alert and oriented to person, place, and time. No cranial nerve deficit.  Skin: Skin is warm. No rash noted. Nails show no clubbing.  Psychiatric: He has a normal mood and affect.      Data Reviewed: Basic Metabolic Panel: Recent Labs  Lab 08/03/19 1335 08/04/19 0207 08/05/19 0438 08/06/19 0517 08/07/19 0422  NA 137 139 141 140 140  K 3.6 4.2 3.5 3.6 3.7  CL 104 102 102 101 98  CO2 25 27 27 29 28   GLUCOSE 119* 113* 166* 141* 187*  BUN 24* 26* 29* 31* 30*  CREATININE 1.17 1.24 1.22 1.22 1.20  CALCIUM 8.3* 8.5* 8.7* 8.5* 8.3*  MG 1.9  --   --   --   --    Liver Function Tests: Recent Labs  Lab 08/03/19 1335 08/04/19 0207 08/06/19 0517  AST 48* 49* 27  ALT 53* 55* 40  ALKPHOS 165* 163* 158*  BILITOT 1.3* 1.6* 1.0  PROT 6.9 7.3 6.8  ALBUMIN 3.1* 3.2* 2.9*   CBC: Recent Labs  Lab 08/03/19 1335 08/04/19 0207  WBC 8.8 8.7  NEUTROABS  --  5.2  HGB 10.7* 10.9*  HCT 34.4* 35.3*  MCV 90.5 90.3  PLT 310 314  BNP (last 3 results) Recent Labs    07/29/19 0845 08/03/19 1335  BNP 2,085.0* 2,085.0*      Recent Results (from the past 240 hour(s))  Novel Coronavirus, NAA (Hosp order, Send-out to Ref Lab; TAT 18-24 hrs     Status: None   Collection Time: 07/29/19 10:55 AM   Specimen: Nasopharyngeal Swab; Respiratory  Result Value Ref Range Status   SARS-CoV-2, NAA NOT DETECTED NOT DETECTED Final    Comment: (NOTE) This nucleic acid amplification test was developed and its performance characteristics determined by [employer]. Nucleic acid amplification tests include PCR and TMA. This test has not been FDA cleared or approved. This test has been authorized by FDA under an Emergency Use Authorization (EUA). This test is  only authorized for the duration of time the declaration that circumstances exist justifying the authorization of the emergency use of in vitro diagnostic tests for detection of SARS-CoV-2 virus and/or diagnosis of COVID-19 infection under section 564(b)(1) of the Act, 21 U.S.C. 951OAC-1(Y) (1), unless the authorization is terminated or revoked sooner. When diagnostic testing is negative, the possibility of a false negative result should be considered in the context of a patient's recent exposures and the presence of clinical signs and symptoms consistent with COVID-19. An individual without symptoms of COVID- 19 and who is not shedding SARS-CoV-2 vi rus would expect to have a negative (not detected) result in this assay. Performed At: [facility] [address] [place], [place] [id number] Rush Farmer MD UX:[id number]    [place]  Final    Comment: Performed at [facility], [place], [place] [address]  SARS CORONAVIRUS 2 (TAT 6-24 HRS) Nasopharyngeal Nasopharyngeal Swab     Status: None   Collection Time: 08/03/19  7:15 PM   Specimen: Nasopharyngeal Swab  Result Value Ref Range Status   SARS Coronavirus 2 NEGATIVE NEGATIVE Final    Comment: (NOTE) SARS-CoV-2 target nucleic acids are NOT DETECTED. The SARS-CoV-2 RNA is generally detectable in upper and lower respiratory specimens during the acute phase of infection. Negative results do not preclude SARS-CoV-2 infection, do not rule out co-infections with other pathogens, and should not be used as the sole basis for treatment or other patient management decisions. Negative results must be combined with clinical observations, patient history, and epidemiological information. The expected result is Negative. Fact Sheet for Patients: SugarRoll.be Fact Sheet for Healthcare Providers: https://www.woods-mathews.com/ This test is not  yet approved or cleared by the [place] FDA and  has been authorized for detection and/or diagnosis of SARS-CoV-2 by FDA under an Emergency Use Authorization (EUA). This EUA will remain  in effect (meaning this test can be used) for the duration of the COVID-19 declaration under Section 56 4(b)(1) of the Act, 21 U.S.C. section 360bbb-3(b)(1), unless the authorization is terminated or revoked sooner. Performed at [facility], [place] [address] [place], [place] [address]       Scheduled Meds: . benzonatate  100 mg Oral TID  . calcium carbonate  1 tablet Oral Daily  . carvedilol  25 mg Oral BID WC  . enoxaparin (LOVENOX) injection  40 mg Subcutaneous Q24H  . ferrous sulfate  325 mg Oral Q breakfast  . fluticasone furoate-vilanterol  1 puff Inhalation Daily  . furosemide  80 mg Intravenous BID  . gabapentin  300 mg Oral TID  . hydrALAZINE  25 mg Oral TID  . methylPREDNISolone (SOLU-MEDROL) injection  40 mg Intravenous Daily  . potassium chloride  20 mEq Oral BID  . sacubitril-valsartan  1 tablet Oral BID  . sodium chloride flush  3 mL Intravenous Once  . sodium chloride flush  3 mL Intravenous Q12H  . spironolactone  12.5 mg Oral Daily  . tiotropium  18 mcg Inhalation Daily   Continuous Infusions: . sodium chloride      Assessment/Plan:  1. Acute on chronic systolic congestive heart failure with anasarca echocardiogram showing an ejection fraction of 25 to 30%.  Reds vest reading elevated at 53 today.  Continue diuresis with IV Lasix.  Patient on Coreg.  Entresto being titrated up.  Continue low-dose spironolactone.  Repeat chest x-ray showed cardiomegaly but not much fluid. 2. Hypertensive urgency on presentation.  Blood pressure better controlled now.  Continue Entresto, hydralazine, Coreg and spironolactone. 3. Elevated liver function test.  Likely secondary to congestive heart failure. 4. Lower extremity edema.  Elevate legs while in bed.  TED hose. 5. COPD with  exacerbation.  Continue 5 days of Solu-Medrol on Breo Ellipta and Spiriva. 6. Neuropathy on gabapentin 7. Hypokalemia replace potassium 8. Cough syncope the other day.  Patient not coughing as much at this point.   Code Status:     Code Status Orders  (From admission, onward)         Start     Ordered   08/03/19 1943  Full code  Continuous     08/03/19 1948        Code Status History    This patient has a current code status but no historical code status.   Advance Care Planning Activity     Family Communication: Spoke with sister on the phone. Disposition Plan: As per cardiology continue IV diuresis.  Consultants:  Cardiology  Time spent: 27 minutes  Aldred M[employer]

## 2019-08-08 ENCOUNTER — Inpatient Hospital Stay: Payer: Self-pay

## 2019-08-08 ENCOUNTER — Encounter: Payer: Self-pay | Admitting: Family Medicine

## 2019-08-08 DIAGNOSIS — I959 Hypotension, unspecified: Secondary | ICD-10-CM

## 2019-08-08 DIAGNOSIS — I472 Ventricular tachycardia: Secondary | ICD-10-CM

## 2019-08-08 DIAGNOSIS — I4729 Other ventricular tachycardia: Secondary | ICD-10-CM

## 2019-08-08 DIAGNOSIS — J90 Pleural effusion, not elsewhere classified: Secondary | ICD-10-CM

## 2019-08-08 LAB — BASIC METABOLIC PANEL
Anion gap: 12 (ref 5–15)
BUN: 24 mg/dL — ABNORMAL HIGH (ref 8–23)
CO2: 28 mmol/L (ref 22–32)
Calcium: 8 mg/dL — ABNORMAL LOW (ref 8.9–10.3)
Chloride: 99 mmol/L (ref 98–111)
Creatinine, Ser: 1.15 mg/dL (ref 0.61–1.24)
GFR calc Af Amer: >60 mL/min (ref >60)
GFR calc non Af Amer: >60 mL/min (ref >60)
Glucose, Bld: 275 mg/dL — ABNORMAL HIGH (ref 70–99)
Potassium: 3.5 mmol/L (ref 3.5–5.1)
Sodium: 139 mmol/L (ref 135–145)

## 2019-08-08 LAB — GLUCOSE, CAPILLARY
Glucose-Capillary: 157 mg/dL — ABNORMAL HIGH (ref 70–99)
Glucose-Capillary: 225 mg/dL — ABNORMAL HIGH (ref 70–99)
Glucose-Capillary: 209 mg/dL — ABNORMAL HIGH (ref 70–99)
Glucose-Capillary: 330 mg/dL — ABNORMAL HIGH (ref 70–99)

## 2019-08-08 MED ORDER — IOHEXOL 350 MG/ML SOLN
75.0000 mL | Freq: Once | INTRAVENOUS | Status: AC | PRN
  Administered 2019-08-08: 75 mL via INTRAVENOUS

## 2019-08-08 MED ORDER — POTASSIUM CHLORIDE CRYS ER 20 MEQ PO TBCR
20.0000 meq | EXTENDED_RELEASE_TABLET | Freq: Three times a day (TID) | ORAL | Status: DC
  Administered 2019-08-08 – 2019-08-09 (×3): 20 meq via ORAL
  Filled 2019-08-08 (×3): qty 1

## 2019-08-08 MED ORDER — SACUBITRIL-VALSARTAN 24-26 MG PO TABS
1.0000 | ORAL_TABLET | Freq: Two times a day (BID) | ORAL | Status: DC
  Administered 2019-08-08 – 2019-08-10 (×4): 1 via ORAL
  Filled 2019-08-08 (×4): qty 1

## 2019-08-08 MED ORDER — MAGNESIUM SULFATE 2 GM/50ML IV SOLN
2.0000 g | Freq: Once | INTRAVENOUS | Status: AC
  Administered 2019-08-08: 2 g via INTRAVENOUS
  Filled 2019-08-08: qty 50

## 2019-08-08 MED ORDER — FUROSEMIDE 10 MG/ML IJ SOLN: 80.0000 mg | Freq: Three times a day (TID) | INTRAMUSCULAR | Status: DC

## 2019-08-08 MED ORDER — FUROSEMIDE 10 MG/ML IJ SOLN
80.0000 mg | Freq: Two times a day (BID) | INTRAMUSCULAR | Status: DC
  Administered 2019-08-09: 80 mg via INTRAVENOUS
  Filled 2019-08-08: qty 8

## 2019-08-08 NOTE — Progress Notes (Signed)
Call received from [place]. Patient with 30 beat run Vian.  BP 92/68.  Patient asymptomatic.  Dr. Leslye Peer made aware. New orders received.

## 2019-08-08 NOTE — Progress Notes (Signed)
Patient ID: Kevin [name], male   DOB: [birth year], 63 y.o.   MRN: [id number] [facility] PROGRESS NOTE  Sydney Azure DDU:[id number] DOB: 05-26-1956 DOA: 08/03/2019 PCP: No primary care provider on file.  HPI/Subjective: Patient states he feels better than when he came in.  Still coughing.  Still little short of breath.  Leg swelling is better.  Objective: Vitals:   08/08/19 0731 08/08/19 1310  BP: 123/81 92/68  Pulse: 73 73  Resp: 20 (!) 22  Temp: 97.9 F (36.6 C) 98.5 F (36.9 C)  SpO2: 98% 96%    Intake/Output Summary (Last 24 hours) at 08/08/2019 1328 Last data filed at 08/08/2019 0924 Gross per 24 hour  Intake 120 ml  Output 800 ml  Net -680 ml   Filed Weights   08/06/19 0400 08/07/19 0403 08/08/19 0500  Weight: 87.6 kg 86.1 kg 89.8 kg    ROS: Review of Systems  Constitutional: Negative for chills and fever.  Eyes: Negative for blurred vision.  Respiratory: Positive for cough and shortness of breath.   Cardiovascular: Negative for chest pain.  Gastrointestinal: Negative for abdominal pain, constipation, diarrhea, nausea and vomiting.  Genitourinary: Negative for dysuria.  Musculoskeletal: Negative for joint pain.  Neurological: Negative for dizziness and headaches.   Exam: Physical Exam  Constitutional: He is oriented to person, place, and time.  HENT:  Nose: No mucosal edema.  Mouth/Throat: No oropharyngeal exudate or posterior oropharyngeal edema.  Eyes: Pupils are equal, round, and reactive to light. Conjunctivae, EOM and lids are normal.  Neck: Carotid bruit is not present. No thyroid mass and no thyromegaly present.  Cardiovascular: S1 normal and S2 normal. Exam reveals no gallop.  No murmur heard. Pulses:      Dorsalis pedis pulses are 2+ on the right side and 2+ on the left side.  Respiratory: No respiratory distress. He has decreased breath sounds in the right lower field and the left lower field. He has no wheezes. He has no rhonchi. He has no rales.   GI: Soft. Bowel sounds are normal. There is no abdominal tenderness.  Musculoskeletal:     Cervical back: No edema.     Right ankle: Swelling present.     Left ankle: Swelling present.  Lymphadenopathy:    He has no cervical adenopathy.  Neurological: He is alert and oriented to person, place, and time. No cranial nerve deficit.  Skin: Skin is warm. No rash noted. Nails show no clubbing.  Psychiatric: He has a normal mood and affect.      Data Reviewed: Basic Metabolic Panel: Recent Labs  Lab 08/03/19 1335 08/04/19 0207 08/05/19 0438 08/06/19 0517 08/07/19 0422 08/08/19 0430  NA 137 139 141 140 140 139  K 3.6 4.2 3.5 3.6 3.7 3.5  CL 104 102 102 101 98 99  CO2 25 27 27 29 28 28   GLUCOSE 119* 113* 166* 141* 187* 275*  BUN 24* 26* 29* 31* 30* 24*  CREATININE 1.17 1.24 1.22 1.22 1.20 1.15  CALCIUM 8.3* 8.5* 8.7* 8.5* 8.3* 8.0*  MG 1.9  --   --   --   --   --    Liver Function Tests: Recent Labs  Lab 08/03/19 1335 08/04/19 0207 08/06/19 0517  AST 48* 49* 27  ALT 53* 55* 40  ALKPHOS 165* 163* 158*  BILITOT 1.3* 1.6* 1.0  PROT 6.9 7.3 6.8  ALBUMIN 3.1* 3.2* 2.9*   CBC: Recent Labs  Lab 08/03/19 1335 08/04/19 0207  WBC 8.8 8.7  NEUTROABS  --  5.2  HGB 10.7* 10.9*  HCT 34.4* 35.3*  MCV 90.5 90.3  PLT 310 314   BNP (last 3 results) Recent Labs    07/29/19 0845 08/03/19 1335  BNP 2,085.0* 2,085.0*      Recent Results (from the past 240 hour(s))  SARS CORONAVIRUS 2 (TAT 6-24 HRS) Nasopharyngeal Nasopharyngeal Swab     Status: None   Collection Time: 08/03/19  7:15 PM   Specimen: Nasopharyngeal Swab  Result Value Ref Range Status   SARS Coronavirus 2 NEGATIVE NEGATIVE Final    Comment: (NOTE) SARS-CoV-2 target nucleic acids are NOT DETECTED. The SARS-CoV-2 RNA is generally detectable in upper and lower respiratory specimens during the acute phase of infection. Negative results do not preclude SARS-CoV-2 infection, do not rule out co-infections with  other pathogens, and should not be used as the sole basis for treatment or other patient management decisions. Negative results must be combined with clinical observations, patient history, and epidemiological information. The expected result is Negative. Fact Sheet for Patients: SugarRoll.be Fact Sheet for Healthcare Providers: https://www.woods-mathews.com/ This test is not yet approved or cleared by the [place] FDA and  has been authorized for detection and/or diagnosis of SARS-CoV-2 by FDA under an Emergency Use Authorization (EUA). This EUA will remain  in effect (meaning this test can be used) for the duration of the COVID-19 declaration under Section 56 4(b)(1) of the Act, 21 U.S.C. section 360bbb-3(b)(1), unless the authorization is terminated or revoked sooner. Performed at [facility], [place] [address], [place], [place] [address]       Scheduled Meds: . benzonatate  100 mg Oral TID  . calcium carbonate  1 tablet Oral Daily  . carvedilol  25 mg Oral BID WC  . ferrous sulfate  325 mg Oral Q breakfast  . fluticasone furoate-vilanterol  1 puff Inhalation Daily  . furosemide  80 mg Intravenous TID  . gabapentin  300 mg Oral TID  . insulin aspart  0-5 Units Subcutaneous QHS  . insulin aspart  0-6 Units Subcutaneous TID WC  . methylPREDNISolone (SOLU-MEDROL) injection  40 mg Intravenous Daily  . potassium chloride  20 mEq Oral TID  . sacubitril-valsartan  1 tablet Oral BID  . sodium chloride flush  3 mL Intravenous Once  . sodium chloride flush  3 mL Intravenous Q12H  . spironolactone  12.5 mg Oral Daily  . tiotropium  18 mcg Inhalation Daily   Continuous Infusions: . sodium chloride      Assessment/Plan:  1. Acute on chronic systolic congestive heart failure with anasarca echocardiogram showing an ejection fraction of 25 to 30%.  Continue diuresis with IV Lasix.  Patient on Coreg, Entresto and low-dose  spironolactone.  Repeat chest x-ray showed cardiomegaly but not much fluid. 2. Moderate right pleural effusion and continued cough.  We will get a right sided thoracentesis tomorrow morning. 3. Nonsustained ventricular tachycardia.  We will give IV magnesium.  Continue oral potassium supplementation. 4. Hypertensive urgency on presentation.  Blood pressure low this afternoon.  Get rid of hydralazine.  Hold Lasix the rest of today.  Holding parameters on Coreg.  Decrease Entresto dose down to lower dose. 5. Elevated liver function test.  Likely secondary to congestive heart failure. 6. Lower extremity edema.  Elevate legs while in bed.  TED hose. 7. COPD with exacerbation.  Continue 5 days of Solu-Medrol on Breo Ellipta and Spiriva. 8. Neuropathy on gabapentin 9. Hypokalemia replace potassium 10. Cough syncope the other day.  Cough is  still present.  But not as bad as on presentation.  Code Status:     Code Status Orders  (From admission, onward)         Start     Ordered   08/03/19 1943  Full code  Continuous     08/03/19 1948        Code Status History    This patient has a current code status but no historical code status.   Advance Care Planning Activity     Family Communication: Spoke with sister on the phone. Disposition Plan: We will get a thoracentesis tomorrow and see how his breathing is.  Consultants:  Cardiology  Time spent: 28 minutes  Corrin Hingle [employer]

## 2019-08-08 NOTE — Progress Notes (Signed)
Progress Note  Patient Name: Nivek Powley Date of Encounter: 08/08/2019  Primary Cardiologist: Debbe Odea, MD  Subjective   Patient says he is still wheezy when he lays flat  [facility] CP    Inpatient Medications    Scheduled Meds: . benzonatate  100 mg Oral TID  . calcium carbonate  1 tablet Oral Daily  . carvedilol  25 mg Oral BID WC  . enoxaparin (LOVENOX) injection  40 mg Subcutaneous Q24H  . ferrous sulfate  325 mg Oral Q breakfast  . fluticasone furoate-vilanterol  1 puff Inhalation Daily  . furosemide  80 mg Intravenous BID  . gabapentin  300 mg Oral TID  . hydrALAZINE  25 mg Oral TID  . insulin aspart  0-5 Units Subcutaneous QHS  . insulin aspart  0-6 Units Subcutaneous TID WC  . methylPREDNISolone (SOLU-MEDROL) injection  40 mg Intravenous Daily  . potassium chloride  20 mEq Oral BID  . sacubitril-valsartan  1 tablet Oral BID  . sodium chloride flush  3 mL Intravenous Once  . sodium chloride flush  3 mL Intravenous Q12H  . spironolactone  12.5 mg Oral Daily  . tiotropium  18 mcg Inhalation Daily   Continuous Infusions: . sodium chloride     PRN Meds: sodium chloride, acetaminophen, guaiFENesin, hydrALAZINE, ipratropium-albuterol, ondansetron (ZOFRAN) IV, ondansetron, sodium chloride flush, traMADol, zolpidem   Vital Signs    Vitals:   08/07/19 1940 08/08/19 0323 08/08/19 0500 08/08/19 0731  BP: 123/79 125/82  123/81  Pulse: 69 72  73  Resp: 20 18  20   Temp: 98.3 F (36.8 C) (!) 97.3 F (36.3 C)  97.9 F (36.6 C)  TempSrc: Oral Oral  Oral  SpO2: 98% 97%  98%  Weight:   89.8 kg   Height:        Intake/Output Summary (Last 24 hours) at 08/08/2019 0944 Last data filed at 08/08/2019 0924 Gross per 24 hour  Intake 120 ml  Output 800 ml  Net -680 ml   NEt neg 7.2 L  Filed Weights   08/06/19 0400 08/07/19 0403 08/08/19 0500  Weight: 87.6 kg 86.1 kg 89.8 kg    Physical Exam   GEN: Well nourished, well developed, in no acute distress.   HEENT: Grossly normal.  Neck: JVP is increased   Cardiac: RRR, no murmurs, rubs, or gallops. No clubbing, cyanosis.  1+ ankle edema w 1-2+ pedal edema.  Radials/DP/PT 2+ and equal bilaterally.  Respiratory:  Respirations regular and unlabored,  No rales   GI: Soft, Mild RU! Tenderness  nondistended, BS + x 4.  MS: no deformity or atrophy. Skin: warm and dry, no rash. Neuro:  Strength and sensation are intact. Psych: AAOx3.  Normal affect.  Labs    Chemistry Recent Labs  Lab 08/03/19 1335 08/04/19 0207 08/06/19 0517 08/07/19 0422 08/08/19 0430  NA 137 139 140 140 139  K 3.6 4.2 3.6 3.7 3.5  CL 104 102 101 98 99  CO2 25 27 29 28 28   GLUCOSE 119* 113* 141* 187* 275*  BUN 24* 26* 31* 30* 24*  CREATININE 1.17 1.24 1.22 1.20 1.15  CALCIUM 8.3* 8.5* 8.5* 8.3* 8.0*  PROT 6.9 7.3 6.8  --   --   ALBUMIN 3.1* 3.2* 2.9*  --   --   AST 48* 49* 27  --   --   ALT 53* 55* 40  --   --   ALKPHOS 165* 163* 158*  --   --   BILITOT  1.3* 1.6* 1.0  --   --   GFRNONAA >60 >60 >60 >60 >60  GFRAA >60 >60 >60 >60 >60  ANIONGAP 8 10 10 14 12      Hematology Recent Labs  Lab 08/03/19 1335 08/04/19 0207  WBC 8.8 8.7  RBC 3.80* 3.91*  HGB 10.7* 10.9*  HCT 34.4* 35.3*  MCV 90.5 90.3  MCH 28.2 27.9  MCHC 31.1 30.9  RDW 16.0* 15.9*  PLT 310 314    Cardiac Enzymes  Recent Labs  Lab 07/29/19 0845 07/29/19 1056 08/04/19 0010 08/04/19 0207  TROPONINIHS 75* 83* 110* 104*      BNP Recent Labs  Lab 08/03/19 1335  BNP 2,085.0*      Radiology    ---  Telemetry    - Personally Reviewed  Cardiac Studies   2D Echocardiogram 12.9.2020  1. Left ventricular ejection fraction, by visual estimation, is 25 to 30%. The left ventricle has severely decreased function. There is moderately increased left ventricular hypertrophy.  2. Left ventricular diastolic parameters are consistent with Grade II diastolic dysfunction (pseudonormalization).  3. Right ventricular volume overload.  4.  The left ventricle demonstrates global hypokinesis.  5. Global right ventricle has moderately reduced systolic function.The right ventricular size is mildly enlarged. Right vetricular wall thickness was not assessed.  6. Left atrial size was normal.  7. Right atrial size was moderately dilated.  8. The mitral valve is normal in structure. No evidence of mitral valve regurgitation.  9. The tricuspid valve is normal in structure. Tricuspid valve regurgitation mild-moderate. 10. The aortic valve is tricuspid. Aortic valve regurgitation is not visualized. 11. The pulmonic valve was not well visualized. Pulmonic valve regurgitation is not visualized. 12. Moderately elevated pulmonary artery systolic pressure. 13. The inferior vena cava is dilated in size with <50% respiratory variability, suggesting right atrial pressure of 15 mmHg.   Patient Profile     63 year old male with history of hypertension, and HFrEF, who was admitted 12/8 w/  worsening lower extremity edema and cough in the setting of running out of meds.  Echo this admission w/ 25-30%.  Assessment & Plan    1. 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  He actually says he had cardiac catheterization in AUg 2019  Admitted w/ volume overload. EF 25-30% by echo this admission.   He is diuresing   Still with volume increase on exam I will increase lasix to tid today with increased K   Check BMET in AM  Follow I/O and exam  2. Hypertensive urgency:    BP is improved.  COntinue current medicine  I would increase Entresto and back off on hydralazine  BP is OK  Hold on further changes for now     4. COPD/Bronchospasm:  Clear now     Awaiting records from [facility]   Stressed importance of f/u    Signed, Dorris Carnes, MD  08/08/2019, 9:44 AM    For questions or updates, please contact   Please consult [email or web address] for contact info under Cardiology/STEMI.

## 2019-08-09 ENCOUNTER — Inpatient Hospital Stay: Payer: Self-pay

## 2019-08-09 DIAGNOSIS — E1140 Type 2 diabetes mellitus with diabetic neuropathy, unspecified: Secondary | ICD-10-CM

## 2019-08-09 LAB — BASIC METABOLIC PANEL
Anion gap: 9 (ref 5–15)
BUN: 28 mg/dL — ABNORMAL HIGH (ref 8–23)
CO2: 30 mmol/L (ref 22–32)
Calcium: 8.5 mg/dL — ABNORMAL LOW (ref 8.9–10.3)
Chloride: 102 mmol/L (ref 98–111)
Creatinine, Ser: 0.95 mg/dL (ref 0.61–1.24)
GFR calc Af Amer: >60 mL/min (ref >60)
GFR calc non Af Amer: >60 mL/min (ref >60)
Glucose, Bld: 154 mg/dL — ABNORMAL HIGH (ref 70–99)
Potassium: 3.7 mmol/L (ref 3.5–5.1)
Sodium: 141 mmol/L (ref 135–145)

## 2019-08-09 LAB — BODY FLUID CELL COUNT WITH DIFFERENTIAL
Eos, Fluid: 0 %
Lymphs, Fluid: 6 %
Monocyte-Macrophage-Serous Fluid: 50 %
Neutrophil Count, Fluid: 44 %
Total Nucleated Cell Count, Fluid: 45 cu mm

## 2019-08-09 LAB — PROTEIN, PLEURAL OR PERITONEAL FLUID: Total protein, fluid: 3.2 g/dL

## 2019-08-09 LAB — GLUCOSE, CAPILLARY
Glucose-Capillary: 188 mg/dL — ABNORMAL HIGH (ref 70–99)
Glucose-Capillary: 313 mg/dL — ABNORMAL HIGH (ref 70–99)
Glucose-Capillary: 317 mg/dL — ABNORMAL HIGH (ref 70–99)
Glucose-Capillary: 337 mg/dL — ABNORMAL HIGH (ref 70–99)

## 2019-08-09 LAB — LACTATE DEHYDROGENASE, PLEURAL OR PERITONEAL FLUID: LD, Fluid: 141 U/L — ABNORMAL HIGH (ref 3–23)

## 2019-08-09 MED ORDER — POTASSIUM CHLORIDE CRYS ER 10 MEQ PO TBCR
10.0000 meq | EXTENDED_RELEASE_TABLET | Freq: Every day | ORAL | Status: DC
  Administered 2019-08-10: 09:00:00 10 meq via ORAL
  Filled 2019-08-09: qty 1

## 2019-08-09 MED ORDER — FUROSEMIDE 40 MG PO TABS
40.0000 mg | ORAL_TABLET | Freq: Every day | ORAL | Status: DC
  Administered 2019-08-09 – 2019-08-10 (×2): 40 mg via ORAL
  Filled 2019-08-09 (×2): qty 1

## 2019-08-09 MED ORDER — ENOXAPARIN SODIUM 40 MG/0.4ML ~~LOC~~ SOLN
40.0000 mg | SUBCUTANEOUS | Status: DC
  Administered 2019-08-09: 40 mg via SUBCUTANEOUS
  Filled 2019-08-09: qty 0.4

## 2019-08-09 MED ORDER — FUROSEMIDE 40 MG PO TABS: 40.0000 mg | ORAL_TABLET | Freq: Every day | ORAL | Status: DC

## 2019-08-09 NOTE — Progress Notes (Signed)
Progress Note  Patient Name: Kevin [name] Date of Encounter: 08/09/2019  Primary Cardiologist: Debbe Odea, MD   Subjective   Patient breathing better today s/p thoracentesis and eager to go home.   Inpatient Medications    Scheduled Meds: . benzonatate  100 mg Oral TID  . calcium carbonate  1 tablet Oral Daily  . carvedilol  25 mg Oral BID WC  . enoxaparin (LOVENOX) injection  40 mg Subcutaneous Q24H  . ferrous sulfate  325 mg Oral Q breakfast  . fluticasone furoate-vilanterol  1 puff Inhalation Daily  . furosemide  80 mg Intravenous BID  . gabapentin  300 mg Oral TID  . insulin aspart  0-5 Units Subcutaneous QHS  . insulin aspart  0-6 Units Subcutaneous TID WC  . potassium chloride  20 mEq Oral TID  . sacubitril-valsartan  1 tablet Oral BID  . sodium chloride flush  3 mL Intravenous Once  . sodium chloride flush  3 mL Intravenous Q12H  . spironolactone  12.5 mg Oral Daily  . tiotropium  18 mcg Inhalation Daily   Continuous Infusions: . sodium chloride Stopped (08/08/19 1729)   PRN Meds: sodium chloride, acetaminophen, guaiFENesin, hydrALAZINE, ipratropium-albuterol, ondansetron (ZOFRAN) IV, ondansetron, sodium chloride flush, traMADol, zolpidem   Vital Signs    Vitals:   08/09/19 0839 08/09/19 0923 08/09/19 1005 08/09/19 1217  BP: 132/87 134/79 130/81   Pulse: 74 78 76   Resp: 16     Temp: 97.8 F (36.6 C)     TempSrc: Oral     SpO2: 100% 96% 99% 96%  Weight:      Height:        Intake/Output Summary (Last 24 hours) at 08/09/2019 1544 Last data filed at 08/09/2019 1134 Gross per 24 hour  Intake 259.02 ml  Output 500 ml  Net -240.98 ml   Last 3 Weights 08/09/2019 08/08/2019 08/07/2019  Weight (lbs) 191 lb 3.2 oz 198 lb 189 lb 12.8 oz  Weight (kg) 86.728 kg 89.812 kg 86.093 kg      Telemetry    NSR, 70s - Personally Reviewed  ECG    No new tracings- Personally Reviewed  Physical Exam   GEN: No acute distress.  S/p  thoracentesis. Neck: No JVD Cardiac: RRR, no murmurs, rubs, or gallops.  Respiratory: Slightly reduced bibasilar breath sounds worse on R>L but improved s/p thoracentesis GI: Soft, nontender, non-distended  MS: Improved bilateral moderate edema, No deformity. Neuro:  Nonfocal  Psych: Normal affect   Labs    High Sensitivity Troponin:   Recent Labs  Lab 07/29/19 0845 07/29/19 1056 08/04/19 0010 08/04/19 0207  TROPONINIHS 75* 83* 110* 104*      Cardiac EnzymesNo results for input(s): TROPONINI in the last 168 hours. No results for input(s): TROPIPOC in the last 168 hours.   Chemistry Recent Labs  Lab 08/03/19 1335 08/04/19 0207 08/06/19 0517 08/07/19 0422 08/08/19 0430 08/09/19 0435  NA 137 139 140 140 139 141  K 3.6 4.2 3.6 3.7 3.5 3.7  CL 104 102 101 98 99 102  CO2 25 27 29 28 28 30   GLUCOSE 119* 113* 141* 187* 275* 154*  BUN 24* 26* 31* 30* 24* 28*  CREATININE 1.17 1.24 1.22 1.20 1.15 0.95  CALCIUM 8.3* 8.5* 8.5* 8.3* 8.0* 8.5*  PROT 6.9 7.3 6.8  --   --   --   ALBUMIN 3.1* 3.2* 2.9*  --   --   --   AST 48* 49* 27  --   --   --  ALT 53* 55* 40  --   --   --   ALKPHOS 165* 163* 158*  --   --   --   BILITOT 1.3* 1.6* 1.0  --   --   --   GFRNONAA >60 >60 >60 >60 >60 >60  GFRAA >60 >60 >60 >60 >60 >60  ANIONGAP 8 10 10 14 12 9      Hematology Recent Labs  Lab 08/03/19 1335 08/04/19 0207  WBC 8.8 8.7  RBC 3.80* 3.91*  HGB 10.7* 10.9*  HCT 34.4* 35.3*  MCV 90.5 90.3  MCH 28.2 27.9  MCHC 31.1 30.9  RDW 16.0* 15.9*  PLT 310 314    BNP Recent Labs  Lab 08/03/19 1335  BNP 2,085.0*     DDimer No results for input(s): DDIMER in the last 168 hours.   Radiology    DG Chest 1 View  Result Date: 08/09/2019 CLINICAL DATA:  Post thoracentesis on the right. EXAM: CHEST  1 VIEW COMPARISON:  CT 08/08/2019. Radiographs 08/07/2019 and 08/03/2019. FINDINGS: 1006 hours. There is no evidence of pneumothorax following thoracentesis. The right pleural effusion  appears slightly smaller. Moderate cardiac enlargement is stable. There is aortic atherosclerosis. The lungs are clear. IMPRESSION: No evidence of pneumothorax following right thoracentesis. The right pleural effusion appears slightly smaller than on the earlier study. Electronically Signed   By: Richardean Sale M.D.   On: 08/09/2019 10:20   CT ANGIO CHEST PE W OR WO CONTRAST  Result Date: 08/08/2019 CLINICAL DATA:  Short of breath EXAM: CT ANGIOGRAPHY CHEST WITH CONTRAST TECHNIQUE: Multidetector CT imaging of the chest was performed using the standard protocol during bolus administration of intravenous contrast. Multiplanar CT image reconstructions and MIPs were obtained to evaluate the vascular anatomy. CONTRAST:  65mL OMNIPAQUE IOHEXOL 350 MG/ML SOLN COMPARISON:  None FINDINGS: Cardiovascular: No filling defects within the pulmonary arteries to suggest acute pulmonary embolism. No acute findings of the aorta or great vessels. No pericardial fluid. LEFT ventricle myocardium appears thickened. Mediastinum/Nodes: No axillary supraclavicular adenopathy. No mediastinal adenopathy. No pericardial effusion. Esophagus normal. Lungs/Pleura: Moderate RIGHT-sided pleural effusion. No pulmonary edema. Minimal RIGHT basilar atelectasis. No suspicious pulmonary nodularity. Upper Abdomen: Limited view of the liver, kidneys, pancreas are unremarkable. Normal adrenal glands. Musculoskeletal: No aggressive osseous lesion. Review of the MIP images confirms the above findings. IMPRESSION: 1. No evidence acute pulmonary embolism. 2. Moderate size layering RIGHT pleural effusion of unclear etiology. No evidence of malignancy. 3. Mild RIGHT basilar atelectasis.  No evidence of pneumonia. 4. LEFT ventricle myocardium appears thickened. Probable ventricular hypertrophy. Electronically Signed   By: Suzy Bouchard M.D.   On: 08/08/2019 10:23   US THORACENTESIS ASP PLEURAL SPACE W/IMG GUIDE  Result Date: 08/09/2019 CLINICAL  DATA:  Right pleural effusion. EXAM: ULTRASOUND GUIDED RIGHT THORACENTESIS COMPARISON:  None. PROCEDURE: An ultrasound guided thoracentesis was thoroughly discussed with the patient and questions answered. The benefits, risks, alternatives and complications were also discussed. The patient understands and wishes to proceed with the procedure. Written consent was obtained. Ultrasound was performed to localize and mark an adequate pocket of fluid in the right chest. The area was then prepped and draped in the normal sterile fashion. 1% Lidocaine was used for local anesthesia. Under real-time ultrasound guidance a 6 French Safe-T-Centesis catheter was introduced into the posterior right pleural space. Thoracentesis was performed. The catheter was removed and a dressing applied. COMPLICATIONS: None FINDINGS: By ultrasound, the volume of right pleural fluid is relatively small but was able to  be sampled under ultrasound guidance. A total of approximately 300 mL of clear, yellow fluid was removed. A fluid sample was sent for laboratory analysis. Postprocedural ultrasound shows no residual pleural fluid remaining. IMPRESSION: Successful ultrasound guided right thoracentesis yielding 300 mL of pleural fluid. Electronically Signed   By: Irish Lack M.D.   On: 08/09/2019 10:29    Cardiac Studies   Echo 08/04/2019  1. Left ventricular ejection fraction, by visual estimation, is 25 to 30%. The left ventricle has severely decreased function. There is moderately increased left ventricular hypertrophy.  2. Left ventricular diastolic parameters are consistent with Grade II diastolic dysfunction (pseudonormalization).  3. Right ventricular volume overload.  4. The left ventricle demonstrates global hypokinesis.  5. Global right ventricle has moderately reduced systolic function.The right ventricular size is mildly enlarged. Right vetricular wall thickness was not assessed.  6. Left atrial size was normal.  7. Right  atrial size was moderately dilated.  8. The mitral valve is normal in structure. No evidence of mitral valve regurgitation.  9. The tricuspid valve is normal in structure. Tricuspid valve regurgitation mild-moderate. 10. The aortic valve is tricuspid. Aortic valve regurgitation is not visualized. 11. The pulmonic valve was not well visualized. Pulmonic valve regurgitation is not visualized. 12. Moderately elevated pulmonary artery systolic pressure. 13. The inferior vena cava is dilated in size with <50% respiratory variability, suggesting right atrial pressure of 15 mmHg.  Patient Profile     63 y.o. male with history of hypertension, congestive heart failure with reduced ejection fraction (25 to 30%), former smoker with COPD, and being seen today for volume overload.  Assessment & Plan    Acute on chronic combined systolic and diastolic CHF --Reportedly previously dx in [place] (moved to [facility]). H/o nonischemic stress test and cath 03/2018 per patient. Admitted with volume overload and after reportedly not taking his medications for over 6 months.  He presented to the emergency room with worsening cough and lower extremity edema. --EF 25 to 30% by echo on echo this admission as above.   --Imaging has shown a small right pleural effusion with patient receiving a thoracentesis today and findings showing 300 cc of clear, yellow fluid removed from right pleural space.  Postprocedure chest x-ray showed right pleural effusion appeared slightly smaller. Patient reports breathing better today. --Transition from IV diuresis with potassium supplementation to oral diuresis with reduced KCl given also on spironolactone and with close monitoring of renal function at follow-up. Daily BMET. Cr improving with slight overnight bump in BUN. Ordered lasix 40mg  BID for today with discharge on po lasix 40mg  daily and KCl daily given also on spiro. Daily BMET. Continue to monitor ins and outs, daily weights.  Continue Coreg 25 mg twice daily, spironolactone 25 mg daily, Entresto 24-26 twice daily.  Hypertensive urgency --BP improved. Escalate antihypertensives as tolerated for more optimal BP control as HR allows. Medical management as above.   For questions or updates, please contact [facility] Please consult [email or web address] for contact info under        Signed, , PA-C  08/09/2019, 3:44 PM

## 2019-08-09 NOTE — Progress Notes (Signed)
Patient ID: Kevin [name], male   DOB: 25-Feb-1956, 63 y.o.   MRN: [id number] [facility] PROGRESS NOTE  Lynne Vallas ZDG:[id number] DOB: 11/04/1955 DOA: 08/03/2019 PCP: No primary care provider on file.  HPI/Subjective: Patient still with a cough.  Breathing better than when he came in.  Appetite good.  Feeling stronger with walking around.  Objective: Vitals:   08/09/19 1005 08/09/19 1217  BP: 130/81   Pulse: 76   Resp:    Temp:    SpO2: 99% 96%    Filed Weights   08/07/19 0403 08/08/19 0500 08/09/19 0502  Weight: 86.1 kg 89.8 kg 86.7 kg    ROS: Review of Systems  Constitutional: Negative for chills and fever.  Eyes: Negative for blurred vision.  Respiratory: Positive for cough and shortness of breath.   Cardiovascular: Negative for chest pain.  Gastrointestinal: Negative for abdominal pain, constipation, diarrhea, nausea and vomiting.  Genitourinary: Negative for dysuria.  Musculoskeletal: Negative for joint pain.  Neurological: Negative for dizziness and headaches.   Exam: Physical Exam  Constitutional: He is oriented to person, place, and time.  HENT:  Nose: No mucosal edema.  Mouth/Throat: No oropharyngeal exudate or posterior oropharyngeal edema.  Eyes: Pupils are equal, round, and reactive to light. Conjunctivae, EOM and lids are normal.  Neck: Carotid bruit is not present. No thyroid mass and no thyromegaly present.  Cardiovascular: S1 normal and S2 normal. Exam reveals no gallop.  No murmur heard. Pulses:      Dorsalis pedis pulses are 2+ on the right side and 2+ on the left side.  Respiratory: No respiratory distress. He has decreased breath sounds in the right lower field and the left lower field. He has no wheezes. He has no rhonchi. He has no rales.  GI: Soft. Bowel sounds are normal. There is no abdominal tenderness.  Musculoskeletal:     Cervical back: No edema.     Right ankle: Swelling present.     Left ankle: Swelling present.  Lymphadenopathy:   He has no cervical adenopathy.  Neurological: He is alert and oriented to person, place, and time. No cranial nerve deficit.  Skin: Skin is warm. No rash noted. Nails show no clubbing.  Psychiatric: He has a normal mood and affect.      Data Reviewed: Basic Metabolic Panel: Recent Labs  Lab 08/03/19 1335 08/05/19 0438 08/06/19 0517 08/07/19 0422 08/08/19 0430 08/09/19 0435  NA 137 141 140 140 139 141  K 3.6 3.5 3.6 3.7 3.5 3.7  CL 104 102 101 98 99 102  CO2 25 27 29 28 28 30   GLUCOSE 119* 166* 141* 187* 275* 154*  BUN 24* 29* 31* 30* 24* 28*  CREATININE 1.17 1.22 1.22 1.20 1.15 0.95  CALCIUM 8.3* 8.7* 8.5* 8.3* 8.0* 8.5*  MG 1.9  --   --   --   --   --    Liver Function Tests: Recent Labs  Lab 08/03/19 1335 08/04/19 0207 08/06/19 0517  AST 48* 49* 27  ALT 53* 55* 40  ALKPHOS 165* 163* 158*  BILITOT 1.3* 1.6* 1.0  PROT 6.9 7.3 6.8  ALBUMIN 3.1* 3.2* 2.9*   CBC: Recent Labs  Lab 08/03/19 1335 08/04/19 0207  WBC 8.8 8.7  NEUTROABS  --  5.2  HGB 10.7* 10.9*  HCT 34.4* 35.3*  MCV 90.5 90.3  PLT 310 314   BNP (last 3 results) Recent Labs    07/29/19 0845 08/03/19 1335  BNP 2,085.0* 2,085.0*  Recent Results (from the past 240 hour(s))  SARS CORONAVIRUS 2 (TAT 6-24 HRS) Nasopharyngeal Nasopharyngeal Swab     Status: None   Collection Time: 08/03/19  7:15 PM   Specimen: Nasopharyngeal Swab  Result Value Ref Range Status   SARS Coronavirus 2 NEGATIVE NEGATIVE Final    Comment: (NOTE) SARS-CoV-2 target nucleic acids are NOT DETECTED. The SARS-CoV-2 RNA is generally detectable in upper and lower respiratory specimens during the acute phase of infection. Negative results do not preclude SARS-CoV-2 infection, do not rule out co-infections with other pathogens, and should not be used as the sole basis for treatment or other patient management decisions. Negative results must be combined with clinical observations, patient history, and epidemiological  information. The expected result is Negative. Fact Sheet for Patients: SugarRoll.be Fact Sheet for Healthcare Providers: https://www.woods-mathews.com/ This test is not yet approved or cleared by the [place] FDA and  has been authorized for detection and/or diagnosis of SARS-CoV-2 by FDA under an Emergency Use Authorization (EUA). This EUA will remain  in effect (meaning this test can be used) for the duration of the COVID-19 declaration under Section 56 4(b)(1) of the Act, 21 U.S.C. section 360bbb-3(b)(1), unless the authorization is terminated or revoked sooner. Performed at [facility], [place] [address], [place], [place] [address]       Scheduled Meds: . benzonatate  100 mg Oral TID  . calcium carbonate  1 tablet Oral Daily  . carvedilol  25 mg Oral BID WC  . enoxaparin (LOVENOX) injection  40 mg Subcutaneous Q24H  . ferrous sulfate  325 mg Oral Q breakfast  . fluticasone furoate-vilanterol  1 puff Inhalation Daily  . furosemide  80 mg Intravenous BID  . gabapentin  300 mg Oral TID  . insulin aspart  0-5 Units Subcutaneous QHS  . insulin aspart  0-6 Units Subcutaneous TID WC  . potassium chloride  20 mEq Oral TID  . sacubitril-valsartan  1 tablet Oral BID  . sodium chloride flush  3 mL Intravenous Once  . sodium chloride flush  3 mL Intravenous Q12H  . spironolactone  12.5 mg Oral Daily  . tiotropium  18 mcg Inhalation Daily   Continuous Infusions: . sodium chloride Stopped (08/08/19 1729)    Assessment/Plan:  1. Acute on chronic systolic congestive heart failure with anasarca echocardiogram showing an ejection fraction of 25 to 30%.  Continue diuresis with IV Lasix.  Patient on Coreg, Entresto and low-dose spironolactone.  2. Moderate right pleural effusion and continued cough.  Patient had thoracentesis today drawing off 300 mL underneath the right lung.  Continue to monitor. 3. Nonsustained ventricular tachycardia.   Continue electrolyte replacement. 4. Hypertensive urgency on presentation.  Low blood pressure yesterday.  Continue low-dose Entresto, Coreg and spironolactone. 5. Elevated liver function test.  Likely secondary to congestive heart failure. 6. Lower extremity edema.  Elevate legs while in bed.  TED hose.  Lower extremity edema has improved during the hospital course. 7. COPD with exacerbation.  Discontinue Solu-Medrol.  Continue inhalers. 8. Type 2 diabetes mellitus.  Hemoglobin A1c 7.1.  Stopping steroid should help out with his sugars.  Diet control discussed. 9. Neuropathy on gabapentin 10. Hypokalemia replace potassium 11. Cough syncope on the first day of the hospitalization.  Hopefully cough will resolve after the thoracentesis.  Code Status:     Code Status Orders  (From admission, onward)         Start     Ordered   08/03/19 1943  Full code  Continuous     08/03/19 1948        Code Status History    This patient has a current code status but no historical code status.   Advance Care Planning Activity     Family Communication: Spoke with sister on the phone. Disposition Plan: Potential discharge tomorrow.  Consultants:  Cardiology  Time spent: 29 minutes  Emmaly [employer]

## 2019-08-09 NOTE — Progress Notes (Signed)
Cardiovascular and Pulmonary Nurse Navigator Note:    Rounded on patient to follow-up on his progress.  Patient sitting up in recliner chair in the quietness of his room.  Patient stated he is marveling at the procedure he had today, science behind it, and how much better he is feeling.  Patient talked about he great care he has received at [facility]. Allowed patient to talk about his experience and illness.   Patient continues on his 1500 ml fluid restriction and 2 gm sodium diet.  Patient has lost approximately 11 pounds this admission.    Reviewed with patient the plan for home health physical therapy, HF para-medicine program, and virtual cardiac rehab.  Patient in agreement and appreciative of  all this support.    Patient understands the [facility] SW will provide scale for patient to weigh himself every morning and the importance behind this.     Again, patient appreciative of the care being provided for him at [facility].    Roanna Epley, RN, BSN, [facility]  Cardiovascular & Pulmonary Nurse Navigator  Direct Line: [phone]  Department Phone #: [phone] Fax: [phone]  Email Address: [email or web address]

## 2019-08-09 NOTE — Progress Notes (Signed)
Physical Therapy Treatment Patient Details Name: Kevin [name] MRN: [id number] DOB: [birth year] Today's Date: 08/09/2019    History of Present Illness Kevin [name] is a 71yoM who comes to [facility] after progressive LEE and SOB, pt also with persistent dry cough. Pt has been in [place] from [place] for his mother, but has had difficulty obtaining Rx medications, unmediated for several months. Pt admitted with CHF exacerbation. Now s/p thoracentesis 12/14 with removal of 341mL fluid.    PT Comments    Progressive increase in gait distance, easily completing 350' with 4WRW, cga/close sup for safety.  Demonstrates ecreased cadence and gait speed, but no overt buckling or LOB; choppy steps with crouched posture.  Does self-initiate seated rest period x1 (on 4WRW) throughout distance; min cuing for brake management and safety. Vitals stable and WFL on RA throughout (Sats 95-96%, HR 80-90s) Decreased standing balance and functional reach noted during session; patient with good awareness and good use of compensatory strategies as appropriate. Nearing baseline level of functional ability; will plan to initiate stair negotiation session date and update POC as appropriate.    Follow Up Recommendations  Home health PT     Equipment Recommendations  None recommended by PT    Recommendations for Other Services       Precautions / Restrictions Precautions Precautions: Fall Restrictions Weight Bearing Restrictions: No    Mobility  Bed Mobility               General bed mobility comments: in chair beginning/end of treatment session  Transfers Overall transfer level: Modified independent Equipment used: 4-wheeled walker             General transfer comment: consistent use of UEs, but no overt buckling or LOB  Ambulation/Gait Ambulation/Gait assistance: Min guard;Supervision Gait Distance (Feet): 350 Feet Assistive device: 4-wheeled walker       General Gait Details: decreased  cadence and gait speed, but no overt buckling or LOB; choppy steps with crouched posture.  Does self-initiate seated rest period x1 (on 4WRW) throughout distance; min cuing for brake management and safety. Vitals stable and WFL on RA throughout (Sats 95-96%, HR 80-90s)   Stairs             Wheelchair Mobility    Modified Rankin (Stroke Patients Only)       Balance Overall balance assessment: Needs assistance Sitting-balance support: No upper extremity supported;Feet supported Sitting balance-Leahy Scale: Normal     Standing balance support: Bilateral upper extremity supported;Single extremity supported Standing balance-Leahy Scale: Fair Standing balance comment: standing functional reach approx 5-6", but does require contralateral UE support for stabilization/safety                            Cognition Arousal/Alertness: Awake/alert Behavior During Therapy: WFL for tasks assessed/performed Overall Cognitive Status: Within Functional Limits for tasks assessed                                        Exercises Other Exercises Other Exercises: Patient standing at sink upon arrival to room, filling water pitcher, mod indep. Other Exercises: Hand hygiene at sink after gait (to prepare for lunch), mod indep; maintains crouched posture, but no buckling, LOB or safery concern.  Uses appropriate compensatory strategies to extend reach as necessary with dynamic balance/reaching activities.    General Comments  Pertinent Vitals/Pain Pain Assessment: No/denies pain    Home Living                      Prior Function            PT Goals (current goals can now be found in the care plan section) Acute Rehab PT Goals Patient Stated Goal: regain strength, improve independence with basic mobility PT Goal Formulation: With patient Time For Goal Achievement: 08/18/19 Potential to Achieve Goals: Good Progress towards PT goals: Progressing  toward goals    Frequency    Min 2X/week      PT Plan Current plan remains appropriate    Co-evaluation              AM-PAC PT "6 Clicks" Mobility   Outcome Measure  Help needed turning from your back to your side while in a flat bed without using bedrails?: None Help needed moving from lying on your back to sitting on the side of a flat bed without using bedrails?: None Help needed moving to and from a bed to a chair (including a wheelchair)?: None Help needed standing up from a chair using your arms (e.g., wheelchair or bedside chair)?: None Help needed to walk in hospital room?: None Help needed climbing 3-5 steps with a railing? : A Little 6 Click Score: 23    End of Session Equipment Utilized During Treatment: Gait belt Activity Tolerance: Patient tolerated treatment well;No increased pain;Patient limited by fatigue Patient left: with call bell/phone within reach;in chair Nurse Communication: Mobility status PT Visit Diagnosis: Muscle weakness (generalized) (M62.81);Other abnormalities of gait and mobility (R26.89);Difficulty in walking, not elsewhere classified (R26.2);Other symptoms and signs involving the nervous system (R29.898);Dizziness and giddiness (R42)     Time: 6063-0160 PT Time Calculation (min) (ACUTE ONLY): 19 min  Charges:  $Gait Training: 8-22 mins                     Zidan Helget H. Manson Passey, PT, DPT, NCS 08/09/19, 12:25 PM [phone]

## 2019-08-09 NOTE — Progress Notes (Signed)
Informed Dr. Kathlene Cote and Vicente Males ([place]) about patient's labs within normal limits (hgb, hct, platelets), last documented lovenox administered 08/07/2019 at 2252, with no PT/INR within past 24 hours.

## 2019-08-09 NOTE — Procedures (Signed)
Interventional Radiology Procedure Note  Procedure: US guided right thoracentesis  Complications: None  Estimated Blood Loss: None  Findings: 300 mL of clear, yellow fluid removed from right pleural space. Post CXR pending.  Venetia Night. Kathlene Cote, M.D Pager:  [phone]

## 2019-08-09 NOTE — Progress Notes (Addendum)
Inpatient Diabetes Program Recommendations  AACE/ADA: New Consensus Statement on Inpatient Glycemic Control (2015)  Target Ranges:  Prepandial:   less than 140 mg/dL      Peak postprandial:   less than 180 mg/dL (1-2 hours)      Critically ill patients:  140 - 180 mg/dL   Results for HAWKEN, BIELBY (MRN [id number]) as of 08/09/2019 10:44  Ref. Range 08/08/2019 08:52 08/08/2019 11:47 08/08/2019 17:19 08/08/2019 21:01  Glucose-Capillary Latest Ref Range: 70 - 99 mg/dL 157 (H)  1 unit NOVOLOG  225 (H)  2 units NOVOLOG  209 (H)  2 units NOVOLOG  330 (H)  4 units NOVOLOG    Results for GIANPAOLO, MINDEL (MRN [id number]) as of 08/09/2019 10:50  Ref. Range 08/07/2019 04:22  Hemoglobin A1C Latest Ref Range: 4.8 - 5.6 % 7.2 (H)  159 mg/dl)    Admit with: Acute on chronic CHF likely diastolic/ Hypertensive urgency/ Pleural Effusion  History: CHF, HTN  No History of Diabetes noted  Current Orders: Novolog 0-6 units TID AC + HS     Underwent US guided right thoracentesis this AM.  Getting Solumedrol 40 mg Daily.   MD- Note patient having significantly elevated afternoon CBGs likely due to the Solumedrol  Please consider adding Novolog Meal Coverage:  Novolog 3 units TID with meals  Also, note that Current A1c level is 7.2%--Do not see history of Diabetes in H&P.  Is this a new diagnosis of diabetes??  If so, please address with patient first, and then the Diabetes RN will address with patient after MD has addressed.  Thanks!      Addendum 1:10pm-- Met with pt today to discuss current A1c of 7.2%.  Explained what an A1c is and what it measures.  Pt told me he has no previous history of diabetes--No family history of diabetes.  Discussed with pt that I cannot diagnose any medical condition and encouraged pt to ask the Attending MD about his A1c and if he will need meds to control his CBGs at time of d/c.  Explained to pt that we are giving him powerful IV steroids that are  likely raising his CBGs.  Explained why we are giving pt Novolog insulin and pt agreeable and stated he was OK to take insulin in the hospital but did NOT want to take insulin at home.  Pt stated he has not been sick and has not taken any steroids at home recently.  Moved to [place] from [place] and does NOT have regular medical care here.  Have placed TOC consult to help pt find PCP to get regular medical care.  Patient told me he drinks 2 monster energy drinks per week and likes desserts.  Discussed DM diet information with patient.  Encouraged patient to avoid beverages with sugar (regular soda, sweet tea, lemonade, fruit juice) and to consume mostly water.  Discussed what foods contain carbohydrates and how carbohydrates affect the body's blood sugar levels.      --Will follow patient during hospitalization--  Wyn Quaker RN, MSN, CDE Diabetes Coordinator Inpatient Glycemic Control Team Team Pager: [phone] (8a-5p)

## 2019-08-10 LAB — GLUCOSE, CAPILLARY: Glucose-Capillary: 136 mg/dL — ABNORMAL HIGH (ref 70–99)

## 2019-08-10 LAB — CYTOLOGY - NON PAP

## 2019-08-10 MED ORDER — LIVING WELL WITH DIABETES BOOK
Freq: Once | Status: DC
  Filled 2019-08-10: qty 1

## 2019-08-10 MED ORDER — ALBUTEROL SULFATE HFA 108 (90 BASE) MCG/ACT IN AERS: 2.0000 | INHALATION_SPRAY | Freq: Four times a day (QID) | RESPIRATORY_TRACT | 0 refills | Status: DC | PRN

## 2019-08-10 MED ORDER — SACUBITRIL-VALSARTAN 24-26 MG PO TABS: 1.0000 | ORAL_TABLET | Freq: Two times a day (BID) | ORAL | 0 refills | Status: DC

## 2019-08-10 MED ORDER — SPIRONOLACTONE 25 MG PO TABS: 12.5000 mg | ORAL_TABLET | Freq: Every day | ORAL | 0 refills | Status: DC

## 2019-08-10 MED ORDER — GABAPENTIN 300 MG PO CAPS: 300.0000 mg | ORAL_CAPSULE | Freq: Two times a day (BID) | ORAL | 0 refills | Status: DC

## 2019-08-10 MED ORDER — TIOTROPIUM BROMIDE MONOHYDRATE 18 MCG IN CAPS: 18.0000 ug | ORAL_CAPSULE | Freq: Every day | RESPIRATORY_TRACT | 0 refills | Status: DC

## 2019-08-10 MED ORDER — CARVEDILOL 25 MG PO TABS: 25.0000 mg | ORAL_TABLET | Freq: Two times a day (BID) | ORAL | 0 refills | Status: DC

## 2019-08-10 MED ORDER — BREO ELLIPTA 200-25 MCG/INH IN AEPB: 1.0000 | INHALATION_SPRAY | Freq: Every day | RESPIRATORY_TRACT | 0 refills | Status: DC

## 2019-08-10 MED ORDER — FUROSEMIDE 40 MG PO TABS: 40.0000 mg | ORAL_TABLET | Freq: Every day | ORAL | 0 refills | Status: DC

## 2019-08-10 NOTE — Progress Notes (Signed)
IV and tele has been removed from patient. Discharge instructions given to patient. Verbalized understanding. No acute distress at this time. Medications have been delivered to patient via CM from med management clinic. Patient advised that he will have to pick up entresto from [place] on [address]. Taxi has been called and voucher has been provided for discharge.

## 2019-08-10 NOTE — Progress Notes (Signed)
Cardiovascular and Pulmonary Nurse Navigator Note:    Rounded on patient.  Patient for discharge today.  Patient dressed in street clothes, sitting in recliner and waiting on discharge paper work.  TOC SW to provide patient with a scale.  Reinforced to patient the importance of keeping ALL follow-up appointments.  Patient verbalized understanding.  Reiterated to patient the medical team had hoped to have a home health nurse to come out to the home, but the [place] [name]) does not have any nurses available to do so.  Patient will have home home physical therapy visits.  Reminded patient of our HF Para-medicine program, which there is no charge, but requires patient to be seen in [name].  Patient verbalized understanding and stated he was appreciative of all help being provided to him.    This RN touched base with Darylene Price, FNP, and Nile Dear, Paramedic, about adding this patient to our [facility] HF Para-medicine program.    Roanna Epley, RN, BSN, [facility]  Cardiovascular & Pulmonary Nurse Navigator  Direct Line: [phone]  Department Phone #: [phone] Fax: [phone]  Email Address: [email or web address]

## 2019-08-10 NOTE — Progress Notes (Signed)
Patient became disoriented, removed his telemetry monitor and gown. Patient admits he forgot where is was. Activated bed alarm.

## 2019-08-10 NOTE — Progress Notes (Signed)
Inpatient Diabetes Program Recommendations  AACE/ADA: New Consensus Statement on Inpatient Glycemic Control (2015)  Target Ranges:  Prepandial:   less than 140 mg/dL      Peak postprandial:   less than 180 mg/dL (1-2 hours)      Critically ill patients:  140 - 180 mg/dL   Results for Kevin [name], Kevin [name] (MRN [id number]) as of 08/10/2019 15:19  Ref. Range 08/07/2019 04:22  Hemoglobin A1C Latest Ref Range: 4.8 - 5.6 % 7.2 (H)   Review of Glycemic Control  Diabetes history: No Outpatient Diabetes medications: NA Current orders for Inpatient glycemic control: Novolog 0-6 units TID with meals, Novolog 0-5 units QHS  NOTE: Noted patient is being newly dx with DM. Patient was seen by Inpatient Diabetes coordinator on 08/09/19 regarding A1C and provided some basic education on DM. Spoke with patient over the phone regarding new DM dx. Patient states that he has the Living Well with DM book and that he has started reading over the book. Patient is being discharged today. Reviewed basic DM education and explained that per MD notes, he is likely going to be asked to modify diet at this time and follow up with provider regarding glycemic control. Patient states that he does not have insurance or a local PCP (moved from [place] but plans to stay in [place]). Patient has been given a list of free/low cost healthcare clinics in the area by Transitions of Care. Encouraged patient to be sure to call one of the clinics and to be sure to arrange follow up. Discussed importance of DM control to prevent complications from uncontrolled DM. Discussed glucose and A1C goals. Reviewed Carb Modified diet and encouraged patient to eliminate sugary beverages. Patient reported that RN in room at this time to give him discharge paperwork. Patient verbalized understanding of information discussed. Encouraged patient to continue to read Living Well with DM book and to follow up. Spoke with RN over the phone to let her know I had spoke with  patient. Patient stated he did not need to speak with me again so ended call.  Thanks, Barnie Alderman, RN, MSN, CDE Diabetes Coordinator Inpatient Diabetes Program [phone] (Team Pager from 8am to 5pm)

## 2019-08-10 NOTE — Progress Notes (Signed)
Physical Therapy Treatment Patient Details Name: Kevin [name] MRN: [id number] DOB: 1955/09/13 Today's Date: 08/10/2019    History of Present Illness Kevin [name] is a 63yoM who comes to [facility] after progressive LEE and SOB, pt also with persistent dry cough. Pt has been in [place] from [facility] caring for his mother, but has had difficulty obtaining Rx medications, unmediated for several months. Pt admitted with CHF exacerbation. Now s/p thoracentesis 12/14 with removal of fluid.    PT Comments    Pt ready for session.  Bed mobility without assist.  Stood and walked x 2 around unit and down main hallway with rollator and min guard/supervision.  After short seated rest on rollator, he walked back to his room and remained in recliner.  Pt stated he has no stairs to enter home and it is all on one level.  Declined stair training today.  Pt feels comfortable with mobility and discharge plan.   Follow Up Recommendations  Home health PT     Equipment Recommendations  None recommended by PT    Recommendations for Other Services       Precautions / Restrictions Precautions Precautions: Fall Restrictions Weight Bearing Restrictions: No    Mobility  Bed Mobility Overal bed mobility: Modified Independent                Transfers Overall transfer level: Modified independent Equipment used: 4-wheeled walker             General transfer comment: consistent use of UEs, but no overt buckling or LOB  Ambulation/Gait Ambulation/Gait assistance: Min guard;Supervision Gait Distance (Feet): 400 Feet Assistive device: 4-wheeled walker Gait Pattern/deviations: Step-to pattern Gait velocity: decreased       Stairs             Wheelchair Mobility    Modified Rankin (Stroke Patients Only)       Balance Overall balance assessment: Needs assistance Sitting-balance support: Feet supported Sitting balance-Leahy Scale: Normal     Standing balance support: Bilateral  upper extremity supported;Single extremity supported Standing balance-Leahy Scale: Fair                              Cognition Arousal/Alertness: Awake/alert Behavior During Therapy: WFL for tasks assessed/performed Overall Cognitive Status: Within Functional Limits for tasks assessed                                        Exercises      General Comments        Pertinent Vitals/Pain Pain Assessment: No/denies pain    Home Living                      Prior Function            PT Goals (current goals can now be found in the care plan section) Progress towards PT goals: Progressing toward goals    Frequency    Min 2X/week      PT Plan Current plan remains appropriate    Co-evaluation              AM-PAC PT "6 Clicks" Mobility   Outcome Measure  Help needed turning from your back to your side while in a flat bed without using bedrails?: None Help needed moving from lying on your back to sitting on the  side of a flat bed without using bedrails?: None Help needed moving to and from a bed to a chair (including a wheelchair)?: None Help needed standing up from a chair using your arms (e.g., wheelchair or bedside chair)?: None Help needed to walk in hospital room?: None Help needed climbing 3-5 steps with a railing? : A Little 6 Click Score: 23    End of Session Equipment Utilized During Treatment: Gait belt Activity Tolerance: Patient tolerated treatment well;No increased pain;Patient limited by fatigue Patient left: with call bell/phone within reach;in chair Nurse Communication: Mobility status       Time: 1601-0932 PT Time Calculation (min) (ACUTE ONLY): 23 min  Charges:  $Gait Training: 8-22 mins                    Chesley Noon, PTA 08/10/19, 9:49 AM

## 2019-08-10 NOTE — Discharge Instructions (Signed)
Medication management for all prescritions except for entresto which went to [facility]     Fingerstick glucose (sugar) goals for home: Before meals: 80-130 mg/dl 2-Hours after meals: less than 180 mg/dl Hemoglobin A1c goal: 7% or less

## 2019-08-10 NOTE — TOC Transition Note (Signed)
Transition of Care [facility]) - CM/SW Discharge Note   Patient Details  Name: Kevin [name] MRN: [id number] Date of Birth: 1955-12-03  Transition of Care [facility]) CM/SW Contact:  Candie Chroman, LCSW Phone Number: 08/10/2019, 3:19 PM   Clinical Narrative: Patient's medications have been delivered. RN is calling the cab to take patient home. No further concerns. CSW signing off.    Final next level of care: Home w Home Health Services Barriers to Discharge: Barriers Resolved   Patient Goals and CMS Choice   CMS Medicare.gov Compare Post Acute Care list provided to:: Other (Comment Required)(No insurance. Looking for charity home health.) Choice offered to / list presented to : Patient  Discharge Placement                Patient to be transferred to facility by: Cab   Patient and family notified of of transfer: 08/10/19  Discharge Plan and Services     Post Acute Care Choice: [place]: PT [place]: [name] Date [place]: 08/10/19   Representative spoke with at [place]: Drue Novel  Social Determinants of Health (SDOH) Interventions     Readmission Risk Interventions No flowsheet data found.

## 2019-08-10 NOTE — TOC Progression Note (Signed)
Transition of Care [facility]) - Progression Note    Patient Details  Name: Kevin [name] MRN: [id number] Date of Birth: 1956-08-09  Transition of Care [facility]) CM/SW Contact  Candie Chroman, LCSW Phone Number: 08/10/2019, 9:37 AM  Clinical Narrative: MD escribed prescriptions to Medication Management. Sent a message to them to notify. Delene Loll was sent to [facility] and [place] coworker will get him 30-day free card. CSW coworker will also provide patient with scale and PCP booklet. Per MD, patient also requesting assistance getting home. Notified CSW coworker for need for cab voucher. CSW confirmed address. Patient has home health orders for PT and RN. [name] is charity home health agency this week. Asked them to review for PT, RN, and heart failure protocol. They are able to do PT but unable to provide a nurse due to staffing. Patient is aware are agreeable.  Expected Discharge Plan: [place] Barriers to Discharge: Continued Medical Work up  Expected Discharge Plan and Services Expected Discharge Plan: [place] Choice: [place] arrangements for the past 2 months: Single Family Home(Condo) Expected Discharge Date: 08/10/19                                     Social Determinants of Health (SDOH) Interventions    Readmission Risk Interventions No flowsheet data found.

## 2019-08-10 NOTE — Discharge Summary (Signed)
[facility] - [place] [facility]   PATIENT NAME: Kevin [name]    MR#:  [id number]  DATE OF BIRTH:  07/21/1956  DATE OF ADMISSION:  08/03/2019 ADMITTING PHYSICIAN: Hannah Beat, MD  DATE OF DISCHARGE: 08/10/2019  3:42 PM  PRIMARY CARE PHYSICIAN: [facility]   ADMISSION DIAGNOSIS:  Acute on chronic systolic congestive heart failure (HCC) [I50.23] Hypertensive urgency [I16.0]  DISCHARGE DIAGNOSIS:  Active Problems:   Hypertensive urgency   Acute on chronic congestive heart failure (HCC)   Elevated LFTs   Lower extremity edema   COPD with acute exacerbation (HCC)   Neuropathy   Anasarca   Pleural effusion on right   Hypotension   Nonsustained ventricular tachycardia (HCC)   Type 2 diabetes, controlled, with neuropathy (HCC)   SECONDARY DIAGNOSIS:   Past Medical History:  Diagnosis Date  . CHF (congestive heart failure) (HCC)   . Hypertension     HOSPITAL COURSE:   1.  Acute on chronic systolic congestive heart failure with anasarca.  Echocardiogram showed ejection fraction of 25 to 30%.  The patient was diuresed with IV Lasix during the entire hospital course.  Patient was started on Entresto and low-dose spironolactone.  Patient already on Coreg.  30-day free card for the [facility] prescribed at [facility].  All other medications went through medication management. 2.  Moderate right pleural effusion.  Patient had thoracentesis by interventional radiology on 08/09/2019.  Patient had 300 mL drawn off.  No further cough. 3.  Nonsustained ventricular tachycardia.  Follow-up with cardiology as outpatient.  Continue to monitor electrolytes as outpatient. 4.  Hypertensive urgency on presentation.  Patient's medications have been adjusted and blood pressure is stable on medication.  Patient on low-dose Entresto, Coreg and spironolactone. 5.  Elevated liver function test on presentation likely with congestive heart failure. 6.  Lower extremity edema.  This has  improved tremendously while here in the hospital.  Continue TED hose. 7.  COPD with exacerbation.  Treated with Solu-Medrol.  Continue his inhalers which were ordered through medication management. 8.  Type 2 diabetes mellitus.  Hemoglobin A1c 7.1.  Stopping steroids helped out with his sugars.  Diet controlled. 9.  Neuropathy on gabapentin 10.  Hypokalemia.  Potassium replacement during hospital course. 11.  Cough syncope on the first day of hospitalization.  DISCHARGE CONDITIONS:   Satisfactory  CONSULTS OBTAINED:  Treatment Team:  Yvonne Kendall, MD  DRUG ALLERGIES:  No Known Allergies  DISCHARGE MEDICATIONS:   Allergies as of 08/10/2019   No Known Allergies     Medication List    STOP taking these medications   hydrALAZINE 25 MG tablet Commonly known as: APRESOLINE   hydrochlorothiazide 12.5 MG capsule Commonly known as: MICROZIDE   ondansetron 4 MG tablet Commonly known as: ZOFRAN   traMADol 50 MG tablet Commonly known as: ULTRAM     TAKE these medications   acetaminophen 325 MG tablet Commonly known as: TYLENOL Take 650 mg by mouth every 6 (six) hours as needed for mild pain or fever.   albuterol 108 (90 Base) MCG/ACT inhaler Commonly known as: VENTOLIN HFA Inhale 2 puffs into the lungs every 6 (six) hours as needed for wheezing or shortness of breath. What changed: how much to take   Breo Ellipta 200-25 MCG/INH Aepb Generic drug: fluticasone furoate-vilanterol Inhale 1 puff into the lungs daily.   calcium carbonate 500 MG chewable tablet Commonly known as: TUMS - dosed in mg elemental calcium Chew 1 tablet by mouth daily.  carvedilol 25 MG tablet Commonly known as: COREG Take 1 tablet (25 mg total) by mouth 2 (two) times daily.   furosemide 40 MG tablet Commonly known as: LASIX Take 1 tablet (40 mg total) by mouth daily. Start taking on: August 11, 2019 What changed:   medication strength  how much to take  when to take this    gabapentin 300 MG capsule Commonly known as: NEURONTIN Take 1 capsule (300 mg total) by mouth 2 (two) times daily.   ipratropium-albuterol 0.5-2.5 (3) MG/3ML Soln Commonly known as: DUONEB Take 3 mLs by nebulization every 6 (six) hours as needed.   sacubitril-valsartan 24-26 MG Commonly known as: ENTRESTO Take 1 tablet by mouth 2 (two) times daily.   spironolactone 25 MG tablet Commonly known as: ALDACTONE Take 0.5 tablets (12.5 mg total) by mouth daily. Start taking on: August 11, 2019   tiotropium 18 MCG inhalation capsule Commonly known as: SPIRIVA Place 1 capsule (18 mcg total) into inhaler and inhale daily. What changed: when to take this        DISCHARGE INSTRUCTIONS:   Follow-up heart failure clinic Follow-up home health. [facility]up [facility] Follow-up cardiology 1 week  If you experience worsening of your admission symptoms, develop shortness of breath, life threatening emergency, suicidal or homicidal thoughts you must seek medical attention immediately by calling 911 or calling your MD immediately  if symptoms less severe.  You Must read complete instructions/literature along with all the possible adverse reactions/side effects for all the Medicines you take and that have been prescribed to you. Take any new Medicines after you have completely understood and accept all the possible adverse reactions/side effects.   Please note  You were cared for by a hospitalist during your hospital stay. If you have any questions about your discharge medications or the care you received while you were in the hospital after you are discharged, you can call the unit and asked to speak with the hospitalist on call if the hospitalist that took care of you is not available. Once you are discharged, your primary care physician will handle any further medical issues. Please note that NO REFILLS for any discharge medications will be authorized once you are discharged, as it is  imperative that you return to your primary care physician (or establish a relationship with a primary care physician if you do not have one) for your aftercare needs so that they can reassess your need for medications and monitor your lab values.    Today   CHIEF COMPLAINT:   Chief Complaint  Patient presents with  . Leg Swelling    HISTORY OF PRESENT ILLNESS:  Kevin [name]  is a 63 y.o. male came in with leg swelling   VITAL SIGNS:  Blood pressure (!) 151/95, pulse 68, temperature (!) 97.5 F (36.4 C), temperature source Oral, resp. rate 18, height 5\' 8"  (1.727 m), weight 87.8 kg, SpO2 100 %. Prior blood pressure was 128/89  PHYSICAL EXAMINATION:  GENERAL:  63 y.o.-year-old patient lying in the bed with no acute distress.  EYES: Pupils equal, round, reactive to light and accommodation. No scleral icterus. Extraocular muscles intact.  HEENT: Head atraumatic, normocephalic. Oropharynx and nasopharynx clear.  NECK:  Supple, no jugular venous distention. No thyroid enlargement, no tenderness.  LUNGS: Normal breath sounds bilaterally, no wheezing, rales,rhonchi or crepitation. No use of accessory muscles of respiration.  CARDIOVASCULAR: S1, S2 normal. No murmurs, rubs, or gallops.  ABDOMEN: Soft, non-tender, non-distended. Bowel sounds present. No organomegaly or  mass.  EXTREMITIES: 2+ pedal edema.no cyanosis, or clubbing.  NEUROLOGIC: Cranial nerves II through XII are intact. Muscle strength 5/5 in all extremities. Sensation intact. Gait not checked.  PSYCHIATRIC: The patient is alert and answers questions appropriately.  SKIN: No obvious rash, lesion, or ulcer.   DATA REVIEW:   CBC Recent Labs  Lab 08/04/19 0207  WBC 8.7  HGB 10.9*  HCT 35.3*  PLT 314    Chemistries  Recent Labs  Lab 08/06/19 0517 08/09/19 0435  NA 140 141  K 3.6 3.7  CL 101 102  CO2 29 30  GLUCOSE 141* 154*  BUN 31* 28*  CREATININE 1.22 0.95  CALCIUM 8.5* 8.5*  AST 27  --   ALT 40  --    ALKPHOS 158*  --   BILITOT 1.0  --     Microbiology Results  Results for orders placed or performed during the hospital encounter of 08/03/19  SARS CORONAVIRUS 2 (TAT 6-24 HRS) Nasopharyngeal Nasopharyngeal Swab     Status: None   Collection Time: 08/03/19  7:15 PM   Specimen: Nasopharyngeal Swab  Result Value Ref Range Status   SARS Coronavirus 2 NEGATIVE NEGATIVE Final    Comment: (NOTE) SARS-CoV-2 target nucleic acids are NOT DETECTED. The SARS-CoV-2 RNA is generally detectable in upper and lower respiratory specimens during the acute phase of infection. Negative results do not preclude SARS-CoV-2 infection, do not rule out co-infections with other pathogens, and should not be used as the sole basis for treatment or other patient management decisions. Negative results must be combined with clinical observations, patient history, and epidemiological information. The expected result is Negative. Fact Sheet for Patients: HairSlick.nohttps://www.fda.gov/media/138098/download Fact Sheet for Healthcare Providers: quierodirigir.comhttps://www.fda.gov/media/138095/download This test is not yet approved or cleared by the [place] FDA and  has been authorized for detection and/or diagnosis of SARS-CoV-2 by FDA under an Emergency Use Authorization (EUA). This EUA will remain  in effect (meaning this test can be used) for the duration of the COVID-19 declaration under Section 56 4(b)(1) of the Act, 21 U.S.C. section 360bbb-3(b)(1), unless the authorization is terminated or revoked sooner. Performed at [facility], [address] [place], [place] [address]   Body fluid culture     Status: None (Preliminary result)   Collection Time: 08/09/19 10:11 AM   Specimen: PATH Cytology Pleural fluid  Result Value Ref Range Status   Specimen Description   Final    PLEURAL Performed at [facility], [address] [place], [place] [address]    Special Requests   Final    PLEURAL Performed at  [facility], [address] [place], [place] [address]    Gram Stain NO WBC SEEN NO ORGANISMS SEEN   Final   Culture   Final    NO GROWTH < 24 HOURS Performed at [facility], [address] [place], [place] [address]    Report Status PENDING  Incomplete    RADIOLOGY:  DG Chest 1 View  Result Date: 08/09/2019 CLINICAL DATA:  Post thoracentesis on the right. EXAM: CHEST  1 VIEW COMPARISON:  CT 08/08/2019. Radiographs 08/07/2019 and 08/03/2019. FINDINGS: 1006 hours. There is no evidence of pneumothorax following thoracentesis. The right pleural effusion appears slightly smaller. Moderate cardiac enlargement is stable. There is aortic atherosclerosis. The lungs are clear. IMPRESSION: No evidence of pneumothorax following right thoracentesis. The right pleural effusion appears slightly smaller than on the earlier study. Electronically Signed   By: Carey BullocksWilliam  Veazey  M.D.   On: 08/09/2019 10:20   US THORACENTESIS ASP PLEURAL SPACE W/IMG GUIDE  Result Date: 08/09/2019 CLINICAL DATA:  Right pleural effusion. EXAM: ULTRASOUND GUIDED RIGHT THORACENTESIS COMPARISON:  None. PROCEDURE: An ultrasound guided thoracentesis was thoroughly discussed with the patient and questions answered. The benefits, risks, alternatives and complications were also discussed. The patient understands and wishes to proceed with the procedure. Written consent was obtained. Ultrasound was performed to localize and mark an adequate pocket of fluid in the right chest. The area was then prepped and draped in the normal sterile fashion. 1% Lidocaine was used for local anesthesia. Under real-time ultrasound guidance a 6 French Safe-T-Centesis catheter was introduced into the posterior right pleural space. Thoracentesis was performed. The catheter was removed and a dressing applied. COMPLICATIONS: None FINDINGS: By ultrasound, the volume of right pleural fluid is relatively small but was able to be sampled under  ultrasound guidance. A total of approximately 300 mL of clear, yellow fluid was removed. A fluid sample was sent for laboratory analysis. Postprocedural ultrasound shows no residual pleural fluid remaining. IMPRESSION: Successful ultrasound guided right thoracentesis yielding 300 mL of pleural fluid. Electronically Signed   By: Aletta Edouard M.D.   On: 08/09/2019 10:29     Management plans discussed with the patient, family and they are in agreement.  CODE STATUS:     Code Status Orders  (From admission, onward)         Start     Ordered   08/03/19 1943  Full code  Continuous     08/03/19 1948        Code Status History    This patient has a current code status but no historical code status.   Advance Care Planning Activity      TOTAL TIME TAKING CARE OF THIS PATIENT: 35 minutes.    Loletha Grayer M.D on 08/10/2019 at 4:09 PM  Between 7am to 6pm - Pager - [phone]  After 6pm go to [email or web address] - password [email or web address]  [facility]  CC: Primary care physician; No primary care provider on file.

## 2019-08-12 ENCOUNTER — Other Ambulatory Visit: Payer: Self-pay

## 2019-08-12 ENCOUNTER — Ambulatory Visit: Payer: Medicaid - Out of State | Attending: Family | Admitting: Family

## 2019-08-12 ENCOUNTER — Encounter: Payer: Self-pay | Admitting: Family

## 2019-08-12 VITALS — BP 149/80 | HR 85 | Resp 18 | Ht 68.0 in | Wt 192.2 lb

## 2019-08-12 DIAGNOSIS — E119 Type 2 diabetes mellitus without complications: Secondary | ICD-10-CM | POA: Insufficient documentation

## 2019-08-12 DIAGNOSIS — Z720 Tobacco use: Secondary | ICD-10-CM | POA: Insufficient documentation

## 2019-08-12 DIAGNOSIS — J449 Chronic obstructive pulmonary disease, unspecified: Secondary | ICD-10-CM | POA: Insufficient documentation

## 2019-08-12 DIAGNOSIS — I5022 Chronic systolic (congestive) heart failure: Secondary | ICD-10-CM

## 2019-08-12 DIAGNOSIS — I110 Hypertensive heart disease with heart failure: Secondary | ICD-10-CM | POA: Insufficient documentation

## 2019-08-12 DIAGNOSIS — Z79899 Other long term (current) drug therapy: Secondary | ICD-10-CM | POA: Insufficient documentation

## 2019-08-12 DIAGNOSIS — I10 Essential (primary) hypertension: Secondary | ICD-10-CM

## 2019-08-12 DIAGNOSIS — Z7951 Long term (current) use of inhaled steroids: Secondary | ICD-10-CM | POA: Insufficient documentation

## 2019-08-12 LAB — BODY FLUID CULTURE
Culture: NO GROWTH
Gram Stain: NONE SEEN

## 2019-08-12 NOTE — Patient Instructions (Addendum)
Continue weighing daily and call for an overnight weight gain of > 2 pounds or a weekly weight gain of >5 pounds.  Drink 60-64 ounces of fluid daily.  

## 2019-08-12 NOTE — Progress Notes (Signed)
Patient ID: Kevin [name], male    DOB: December 05, 1955, 63 y.o.   MRN: [id number]  HPI  Mr Santillanes is a 63 y/o male with a history of HTN, COPD, tobacco use and chronic heart failure.   Echo report from 08/04/2019 reviewed and showed an EF of 25-30% along with mild/moderate TR.   Admitted 08/03/2019 due to acute on chronic HF. Cardiology consult obtained. Initially given IV lasix and then transitioned to oral diuretics. Right thoracentesis done with removal of . Oral medications adjusted for his HTN. Given solumedrol for COPD. Discharged after 7 days. Was in the ED 07/29/2019 due to mild HF exacerbation where he was treated and released.   He presents today for his initial visit with a chief complaint of minimal shortness of breath upon moderate exertion. He describes this as chronic in nature having been present for several years. He has associated cough along with this. He denies any difficulty sleeping, abdominal distention, palpitations, pedal edema, chest pain, dizziness, fatigue or weight gain.   He says that he lives with his mom and has no transportation for appointments. He's not sure whether he would qualify for medicare or medicaid and is getting his medications from medication management clinic.   Past Medical History:  Diagnosis Date  . CHF (congestive heart failure) (HCC)   . Diabetes mellitus without complication (HCC)   . Hypertension    History reviewed. No pertinent surgical history. History reviewed. No pertinent family history. Social History   Tobacco Use  . Smoking status: Former Games developer  . Smokeless tobacco: Never Used  Substance Use Topics  . Alcohol use: Never   No Known Allergies Prior to Admission medications   Medication Sig Start Date End Date Taking? Authorizing Provider  acetaminophen (TYLENOL) 325 MG tablet Take 650 mg by mouth every 6 (six) hours as needed for mild pain or fever.    Yes [provider]  albuterol (VENTOLIN HFA) 108 (90 Base)  MCG/ACT inhaler Inhale 2 puffs into the lungs every 6 (six) hours as needed for wheezing or shortness of breath. 08/10/19  Yes Wieting, Richard, MD  calcium carbonate (TUMS - DOSED IN MG ELEMENTAL CALCIUM) 500 MG chewable tablet Chew 1 tablet by mouth daily.   Yes [provider]  carvedilol (COREG) 25 MG tablet Take 1 tablet (25 mg total) by mouth 2 (two) times daily. 08/10/19  Yes Wieting, Richard, MD  fluticasone furoate-vilanterol (BREO ELLIPTA) 200-25 MCG/INH AEPB Inhale 1 puff into the lungs daily. 08/10/19  Yes Wieting, Richard, MD  furosemide (LASIX) 40 MG tablet Take 1 tablet (40 mg total) by mouth daily. 08/11/19  Yes Wieting, Richard, MD  gabapentin (NEURONTIN) 300 MG capsule Take 1 capsule (300 mg total) by mouth 2 (two) times daily. 08/10/19  Yes Wieting, Richard, MD  ipratropium-albuterol (DUONEB) 0.5-2.5 (3) MG/3ML SOLN Take 3 mLs by nebulization every 6 (six) hours as needed.   Yes [provider]  sacubitril-valsartan (ENTRESTO) 24-26 MG Take 1 tablet by mouth 2 (two) times daily. 08/10/19  Yes Wieting, Richard, MD  spironolactone (ALDACTONE) 25 MG tablet Take 0.5 tablets (12.5 mg total) by mouth daily. 08/11/19  Yes Alford Highland, MD  tiotropium (SPIRIVA) 18 MCG inhalation capsule Place 1 capsule (18 mcg total) into inhaler and inhale daily. 08/10/19  Yes Alford Highland, MD    Review of Systems  Constitutional: Negative for fatigue.  HENT: Negative for congestion, postnasal drip and sore throat.   Eyes: Negative.   Respiratory: Positive for cough (dry) and  shortness of breath. Negative for chest tightness.   Cardiovascular: Negative for chest pain, palpitations and leg swelling.  Gastrointestinal: Negative for abdominal distention and abdominal pain.  Endocrine: Negative.   Musculoskeletal: Negative for back pain and neck pain.  Skin: Negative.   Allergic/Immunologic: Negative.   Neurological: Negative for dizziness and light-headedness.   Hematological: Negative for adenopathy. Does not bruise/bleed easily.  Psychiatric/Behavioral: Negative for dysphoric mood and sleep disturbance (sleeping on 2 pillows). The patient is not nervous/anxious.    Vitals:   08/12/19 1318  BP: (!) 149/80  Pulse: 85  Resp: 18  SpO2: 100%  Weight: 192 lb 3.2 oz (87.2 kg)  Height: 5\' 8"  (1.727 m)   Wt Readings from Last 3 Encounters:  08/12/19 192 lb 3.2 oz (87.2 kg)  08/10/19 193 lb 8 oz (87.8 kg)  07/29/19 200 lb (90.7 kg)   Lab Results  Component Value Date   CREATININE 0.95 08/09/2019   CREATININE 1.15 08/08/2019   CREATININE 1.20 08/07/2019    Physical Exam Vitals and nursing note reviewed.  Constitutional:      Appearance: Normal appearance.  HENT:     Head: Normocephalic and atraumatic.  Neck:     Vascular: No carotid bruit.  Cardiovascular:     Rate and Rhythm: Normal rate and regular rhythm.  Pulmonary:     Effort: Pulmonary effort is normal. No respiratory distress.     Breath sounds: No wheezing or rales.  Abdominal:     General: Abdomen is flat. There is no distension.     Palpations: Abdomen is soft.  Musculoskeletal:        General: No tenderness.     Cervical back: Normal range of motion and neck supple.     Right lower leg: No edema.     Left lower leg: Edema (trace pitting) present.  Skin:    General: Skin is warm and dry.  Neurological:     General: No focal deficit present.     Mental Status: He is alert and oriented to person, place, and time.  Psychiatric:        Mood and Affect: Mood normal.        Behavior: Behavior normal.    Assessment & Plan:  1: Chronic heart failure with reduced ejection fraction- - NYHA class II - euvolemic today - weighing daily and reinforced to call for an overnight weight gain of >2 pounds or a weekly weight gain of >5 pounds - not adding salt and has been trying to read food labels; reviewed the importance of closely following a 2000mg  sodium diet and written  dietary information was given to him  - sees cardiology Brion Aliment) 08/25/2019 - consider titrating up entresto at future visits; may also add farxiga in the future - will make referral to paramedicine program - BNP 08/03/2019 was 2085.0 - patient says that he's received his flu vaccine for this season  2: HTN- - BP looks good today - does not have a PCP as he's uninsured - BMP on 08/09/2019 reviewed and showed sodium 141, potassium 3.7, creatinine 0.95 and GFR >60  3: COPD-  - using nebulizer and inhalers - smoking 1 cigarette ~ every month  Medications were reviewed.   Will contact social worker at [name] for assistance in what he needs to do regarding obtaining medical insurance. Entresto voucher given to patient as he doesn't think he received one of those. Medication management clinic called and they confirmed that patient is established  with them already.   Due to patient having transportation issues (patient used [employer] transportation today), will see patient next on the same day he sees [facility] cardiology.

## 2019-08-16 ENCOUNTER — Telehealth (HOSPITAL_COMMUNITY): Payer: Self-pay | Admitting: Licensed Clinical Social Worker

## 2019-08-16 NOTE — Telephone Encounter (Signed)
CSW received consult that pt currently uninsured and needing guidance on how to proceed.    Pt reports that he moved from [place] earlier this year and that when he was in [place] he had had some kind of medicaid and was receiving what he thinks was partial disability.  CSW inquired if he informed [place] case worker he would be moving to [place] permanently and he reports that he did not- according to financial counseling notes it would appear as if that [place] Medicaid might still be active.  CSW informed pt he would need to terminate his [place] Medicaid before he could move forward with applying for [place]- pt to locate [place] Case Workers number.  CSW then called financial counseler, Lizbeth Bark, who works at [employer] to inquire about their involvement as pt had reported he had spoken to someone about Medicaid while in the hospital.  Ms. Alveta Heimlich reports that she is working with him and that she would be able to help apply for retro and ongoing medicaid once pt has terminated his [place] Medicaid.  CSW will continue to follow and assist as needed  Kevin [name], [facility] Desk#: [phone] Cell#: [phone]

## 2019-08-24 NOTE — Progress Notes (Deleted)
Patient ID: Kevin [name], male    DOB: 1955-09-16, 63 y.o.   MRN: [id number]  HPI  Mr Conchas is a 63 y/o male with a history of HTN, COPD, tobacco use and chronic heart failure.   Echo report from 08/04/2019 reviewed and showed an EF of 25-30% along with mild/moderate TR.   Admitted 08/03/2019 due to acute on chronic HF. Cardiology consult obtained. Initially given IV lasix and then transitioned to oral diuretics. Right thoracentesis done with removal of 326ml. Oral medications adjusted for his HTN. Given solumedrol for COPD. Discharged after 7 days. Was in the ED 07/29/2019 due to mild HF exacerbation where he was treated and released.   He presents today for a follow-up visit with a chief complaint of    Social worker from [facility] has been in contact with patient.   Past Medical History:  Diagnosis Date  . CHF (congestive heart failure) ([place])   . Diabetes mellitus without complication ([place])   . Hypertension    No past surgical history on file. No family history on file. Social History   Tobacco Use  . Smoking status: Former Research scientist (life sciences)  . Smokeless tobacco: Never Used  Substance Use Topics  . Alcohol use: Never   No Known Allergies   Review of Systems  Constitutional: Negative for fatigue.  HENT: Negative for congestion, postnasal drip and sore throat.   Eyes: Negative.   Respiratory: Positive for cough (dry) and shortness of breath. Negative for chest tightness.   Cardiovascular: Negative for chest pain, palpitations and leg swelling.  Gastrointestinal: Negative for abdominal distention and abdominal pain.  Endocrine: Negative.   Musculoskeletal: Negative for back pain and neck pain.  Skin: Negative.   Allergic/Immunologic: Negative.   Neurological: Negative for dizziness and light-headedness.  Hematological: Negative for adenopathy. Does not bruise/bleed easily.  Psychiatric/Behavioral: Negative for dysphoric mood and sleep disturbance (sleeping on 2  pillows). The patient is not nervous/anxious.      Physical Exam Vitals and nursing note reviewed.  Constitutional:      Appearance: Normal appearance.  HENT:     Head: Normocephalic and atraumatic.  Neck:     Vascular: No carotid bruit.  Cardiovascular:     Rate and Rhythm: Normal rate and regular rhythm.  Pulmonary:     Effort: Pulmonary effort is normal. No respiratory distress.     Breath sounds: No wheezing or rales.  Abdominal:     General: Abdomen is flat. There is no distension.     Palpations: Abdomen is soft.  Musculoskeletal:        General: No tenderness.     Cervical back: Normal range of motion and neck supple.     Right lower leg: No edema.     Left lower leg: Edema (trace pitting) present.  Skin:    General: Skin is warm and dry.  Neurological:     General: No focal deficit present.     Mental Status: He is alert and oriented to person, place, and time.  Psychiatric:        Mood and Affect: Mood normal.        Behavior: Behavior normal.    Assessment & Plan:  1: Chronic heart failure with reduced ejection fraction- - NYHA class II - euvolemic today - weighing daily and reinforced to call for an overnight weight gain of >2 pounds or a weekly weight gain of >5 pounds - weight 192.3 from last visit 2 weeks ago - not adding  salt and has been trying to read food labels; reviewed the importance of closely following a 2000mg  sodium diet  - sees cardiology ) 08/25/2019 - consider titrating up entresto at future visits; may also add farxiga in the future - will make referral to paramedicine program - BNP 08/03/2019 was 2085.0 - patient says that he's received his flu vaccine for this season  2: HTN- - BP  - does not have a PCP as he's uninsured - BMP on 08/09/2019 reviewed and showed sodium 141, potassium 3.7, creatinine 0.95 and GFR >60  3: COPD-  - using nebulizer and inhalers - smoking 1 cigarette ~ every month  Medications were reviewed.

## 2019-08-25 ENCOUNTER — Ambulatory Visit: Payer: Self-pay | Admitting: Nurse Practitioner

## 2019-08-25 ENCOUNTER — Ambulatory Visit: Payer: Medicaid - Out of State | Admitting: Family

## 2019-08-25 ENCOUNTER — Ambulatory Visit: Payer: Medicaid - Out of State | Attending: Family | Admitting: Family

## 2019-08-25 ENCOUNTER — Encounter: Payer: Self-pay | Admitting: Family

## 2019-08-25 ENCOUNTER — Telehealth: Payer: Self-pay | Admitting: Family

## 2019-08-25 ENCOUNTER — Other Ambulatory Visit: Payer: Self-pay

## 2019-08-25 ENCOUNTER — Encounter: Payer: Self-pay | Admitting: Nurse Practitioner

## 2019-08-25 VITALS — BP 143/106 | HR 102 | Resp 18 | Ht 68.0 in | Wt 195.2 lb

## 2019-08-25 DIAGNOSIS — J449 Chronic obstructive pulmonary disease, unspecified: Secondary | ICD-10-CM | POA: Insufficient documentation

## 2019-08-25 DIAGNOSIS — Z87891 Personal history of nicotine dependence: Secondary | ICD-10-CM | POA: Insufficient documentation

## 2019-08-25 DIAGNOSIS — R0602 Shortness of breath: Secondary | ICD-10-CM | POA: Insufficient documentation

## 2019-08-25 DIAGNOSIS — E119 Type 2 diabetes mellitus without complications: Secondary | ICD-10-CM | POA: Insufficient documentation

## 2019-08-25 DIAGNOSIS — I5042 Chronic combined systolic (congestive) and diastolic (congestive) heart failure: Secondary | ICD-10-CM | POA: Insufficient documentation

## 2019-08-25 DIAGNOSIS — R05 Cough: Secondary | ICD-10-CM | POA: Insufficient documentation

## 2019-08-25 DIAGNOSIS — I10 Essential (primary) hypertension: Secondary | ICD-10-CM

## 2019-08-25 DIAGNOSIS — I110 Hypertensive heart disease with heart failure: Secondary | ICD-10-CM | POA: Insufficient documentation

## 2019-08-25 DIAGNOSIS — I5022 Chronic systolic (congestive) heart failure: Secondary | ICD-10-CM

## 2019-08-25 DIAGNOSIS — Z79899 Other long term (current) drug therapy: Secondary | ICD-10-CM | POA: Insufficient documentation

## 2019-08-25 MED ORDER — SACUBITRIL-VALSARTAN 49-51 MG PO TABS: 1.0000 | ORAL_TABLET | Freq: Two times a day (BID) | ORAL | 3 refills | Status: DC

## 2019-08-25 MED ORDER — BREO ELLIPTA 200-25 MCG/INH IN AEPB: 1.0000 | INHALATION_SPRAY | Freq: Every day | RESPIRATORY_TRACT | 5 refills | Status: DC

## 2019-08-25 NOTE — Telephone Encounter (Signed)
Patient did not show for his [facility] Failure [facility] appointment on 08/25/2019. Will attempt to reschedule.

## 2019-08-25 NOTE — Progress Notes (Deleted)
Office Visit    Patient Name: Kevin [name] Date of Encounter: 08/25/2019  Primary Care Provider:  Patient, No Pcp Per Primary Cardiologist:  Kate Sable, MD  Chief Complaint    63 year old male with a history of hypertension, diabetes, prior tobacco abuse, COPD, and chronic combined systolic and diastolic congestive heart failure, who presents for follow-up after recent hospital admission for hypertensive urgency, right pleural effusion, and CHF.  Past Medical History    Past Medical History:  Diagnosis Date  . Chronic combined systolic (congestive) and diastolic (congestive) heart failure ([place])    a. 2019 reported neg stress test and nl cors on cath in [facility]; b. 08/2018 Echo: EF 25-30%, Gr2 DD, glob HK. Mod reduced RV fxn w/ volume overload. Mod dil RA. Mild to mod TR. Mod elev PASP.  Marland Kitchen COPD (chronic obstructive pulmonary disease) ([place])   . Diabetes mellitus without complication ([place])   . Hypertension   . Pleural effusion on right    a. 07/2019 Thoracentesis: 300 ml.   No past surgical history on file.  Allergies  No Known Allergies  History of Present Illness    63 year old male with the above past medical history including hypertension, diabetes, COPD, and chronic combined systolic and diastolic congestive heart failure.  Per patient, he was diagnosed with congestive heart failure at some point in 2019.  He was living in [place] at the time and said that he had a stress test and reportedly norma diagnostic catheterization.Marland Kitchen  He moved to [place] in early 2020 to help take care of his mother.  Since arriving in [place] he had difficulty refilling medications and had been out of them for approximately 6 months prior to presentation to [facility] in early December with worsening cough and lower extremity edema.  He was seen in the emergency department and prescribed oral Lasix and discharged.  Unfortunately, his condition worsened prompting repeat  ER visit.  He was hypertensive on admission and his chest x-ray showed cardiomegaly with right pleural effusion.  He was aggressively diuresed and our team was consulted.  An echocardiogram showed an EF of 25 to 30% with grade 2 diastolic dysfunction and global hypokinesis.  He was also noted to have moderately reduced RV function with volume overload.  CT of the chest was negative for PE but did show a moderate sized layering right pleural effusion.  Thoracentesis was carried out with removal of 300 mL of pleural fluid.  We added carvedilol, spironolactone, hydralazine, and Entresto.  After adequate diuresis, he was discharged home on December 15, at a weight of 191 pounds.  Renal function was stable throughout admission.  Since discharge,***  Home Medications    Prior to Admission medications   Medication Sig Start Date End Date Taking? Authorizing Provider  acetaminophen (TYLENOL) 325 MG tablet Take 650 mg by mouth every 6 (six) hours as needed for mild pain or fever.     [provider]  albuterol (VENTOLIN HFA) 108 (90 Base) MCG/ACT inhaler Inhale 2 puffs into the lungs every 6 (six) hours as needed for wheezing or shortness of breath. 08/10/19   Loletha Grayer, MD  calcium carbonate (TUMS - DOSED IN MG ELEMENTAL CALCIUM) 500 MG chewable tablet Chew 1 tablet by mouth daily.    [provider]  carvedilol (COREG) 25 MG tablet Take 1 tablet (25 mg total) by mouth 2 (two) times daily. 08/10/19   Loletha Grayer, MD  fluticasone furoate-vilanterol (BREO ELLIPTA) 200-25 MCG/INH AEPB  Inhale 1 puff into the lungs daily. 08/10/19   Alford Highland, MD  furosemide (LASIX) 40 MG tablet Take 1 tablet (40 mg total) by mouth daily. 08/11/19   Alford Highland, MD  gabapentin (NEURONTIN) 300 MG capsule Take 1 capsule (300 mg total) by mouth 2 (two) times daily. 08/10/19   Alford Highland, MD  ipratropium-albuterol (DUONEB) 0.5-2.5 (3) MG/3ML SOLN Take 3 mLs by nebulization every 6 (six)  hours as needed.    [provider]  sacubitril-valsartan (ENTRESTO) 24-26 MG Take 1 tablet by mouth 2 (two) times daily. 08/10/19   Alford Highland, MD  spironolactone (ALDACTONE) 25 MG tablet Take 0.5 tablets (12.5 mg total) by mouth daily. 08/11/19   Alford Highland, MD  tiotropium (SPIRIVA) 18 MCG inhalation capsule Place 1 capsule (18 mcg total) into inhaler and inhale daily. 08/10/19   Alford Highland, MD    Review of Systems    ***.  All other systems reviewed and are otherwise negative except as noted above.  Physical Exam    VS:  There were no vitals taken for this visit. , BMI There is no height or weight on file to calculate BMI. GEN: Well nourished, well developed, in no acute distress. HEENT: normal. Neck: Supple, no JVD, carotid bruits, or masses. Cardiac: RRR, no murmurs, rubs, or gallops. No clubbing, cyanosis, edema.  Radials/DP/PT 2+ and equal bilaterally.  Respiratory:  Respirations regular and unlabored, clear to auscultation bilaterally. GI: Soft, nontender, nondistended, BS + x 4. MS: no deformity or atrophy. Skin: warm and dry, no rash. Neuro:  Strength and sensation are intact. Psych: Normal affect.  Accessory Clinical Findings    ECG personally reviewed by me today - *** - no acute changes.  Lab Results  Component Value Date   WBC 8.7 08/04/2019   HGB 10.9 (L) 08/04/2019   HCT 35.3 (L) 08/04/2019   MCV 90.3 08/04/2019   PLT 314 08/04/2019   Lab Results  Component Value Date   CREATININE 0.95 08/09/2019   BUN 28 (H) 08/09/2019   NA 141 08/09/2019   K 3.7 08/09/2019   CL 102 08/09/2019   CO2 30 08/09/2019   Lab Results  Component Value Date   ALT 40 08/06/2019   AST 27 08/06/2019   ALKPHOS 158 (H) 08/06/2019   BILITOT 1.0 08/06/2019   No results found for: CHOL, HDL, LDLCALC, LDLDIRECT, TRIG, CHOLHDL  Lab Results  Component Value Date   HGBA1C 7.2 (H) 08/07/2019    Assessment & Plan    1.  ***   Nicolasa Ducking, NP  08/25/2019, 9:53 AM

## 2019-08-25 NOTE — Patient Instructions (Addendum)
Continue weighing daily and call for an overnight weight gain of > 2 pounds or a weekly weight gain of >5 pounds.  Finish your current entresto bottle by taking 2 tablets by mouth twice daily until that bottle is gone. Then you will start the new entresto bottle of 49/51mg  by taking 1 tablet twice a day

## 2019-08-25 NOTE — Progress Notes (Signed)
Patient ID: Kevin [name], male    DOB: 22-Feb-1956, 63 y.o.   MRN: [id number]  HPI  Kevin [name] is a 63 y/o male with a history of HTN, COPD, tobacco use and chronic heart failure.   Echo report from 08/04/2019 reviewed and showed an EF of 25-30% along with mild/moderate TR.   Admitted 08/03/2019 due to acute on chronic HF. Cardiology consult obtained. Initially given IV lasix and then transitioned to oral diuretics. Right thoracentesis done with removal of . Oral medications adjusted for his HTN. Given solumedrol for COPD. Discharged after 7 days. Was in the ED 07/29/2019 due to mild HF exacerbation where he was treated and released.   He presents today for a follow-up visit with a chief complaint of minimal shortness of breath upon moderate exertion. He describes this as chronic in nature having been present for several years. He has associated dry cough along with this. He denies any difficulty sleeping, dizziness, abdominal distention, palpitations, pedal edema, chest pain, fatigue or weight gain.   Social worker from [name] has been in contact with patient. Patient had an appointment earlier in the day with [place] that he missed but then called back later in the day to R/S it to this afternoon.   He brought a pill box which is not filled in correctly with missing days/times that don't match the current day of the week. He didn't bring all his bottles but remembers having carvedilol and entresto at home. He brought a lisinopril bottle which he says he doesn't take. He had "someone from the neighborhood" bring him today but needs a ride home.   Past Medical History:  Diagnosis Date  . Chronic combined systolic (congestive) and diastolic (congestive) heart failure (H[facility])    a. 2019 reported neg stress test and nl cors on cath in [facility]; b. 08/2018 Echo: EF 25-30%, Gr2 DD, glob HK. Mod reduced RV fxn w/ volume overload. Mod dil RA. Mild to mod TR. Mod elev PASP.  Marland Kitchen COPD (chronic  obstructive pulmonary disease) (H[facility])   . Diabetes mellitus without complication ([facility])   . Hypertension   . Pleural effusion on right    a. 07/2019 Thoracentesis: 300 ml.    Social History   Tobacco Use  . Smoking status: Former Games developer  . Smokeless tobacco: Never Used  Substance Use Topics  . Alcohol use: Never   No Known Allergies  Prior to Admission medications   Medication Sig Start Date End Date Taking? Authorizing Provider  acetaminophen (TYLENOL) 325 MG tablet Take 650 mg by mouth every 6 (six) hours as needed for mild pain or fever.    Yes [provider]  albuterol (VENTOLIN HFA) 108 (90 Base) MCG/ACT inhaler Inhale 2 puffs into the lungs every 6 (six) hours as needed for wheezing or shortness of breath. 08/10/19  Yes Wieting, Richard, MD  amLODipine (NORVASC) 10 MG tablet Take 10 mg by mouth daily.   Yes [provider]  calcium carbonate (TUMS - DOSED IN MG ELEMENTAL CALCIUM) 500 MG chewable tablet Chew 1 tablet by mouth daily.   Yes [provider]  carvedilol (COREG) 25 MG tablet Take 1 tablet (25 mg total) by mouth 2 (two) times daily. 08/10/19  Yes Wieting, Richard, MD  fluticasone furoate-vilanterol (BREO ELLIPTA) 200-25 MCG/INH AEPB Inhale 1 puff into the lungs daily. 08/25/19  Yes Clarisa [name] A, FNP  furosemide (LASIX) 40 MG tablet Take 1 tablet (40 mg total) by mouth daily. Patient taking  differently: Take 40 mg by mouth 2 (two) times daily.  08/11/19  Yes Wieting, Richard, MD  gabapentin (NEURONTIN) 300 MG capsule Take 1 capsule (300 mg total) by mouth 2 (two) times daily. 08/10/19  Yes Wieting, Richard, MD  ipratropium-albuterol (DUONEB) 0.5-2.5 (3) MG/3ML SOLN Take 3 mLs by nebulization every 6 (six) hours as needed.   Yes [provider]  sacubitril-valsartan (ENTRESTO) 24-26 MG Take 1 tablet by mouth 2 (two) times daily. 08/10/19  Yes Wieting, Richard, MD  spironolactone (ALDACTONE) 25 MG tablet Take 0.5 tablets (12.5 mg total)  by mouth daily. Patient taking differently: Take 25 mg by mouth daily.  08/11/19  Yes Loletha Grayer, MD  tiotropium (SPIRIVA) 18 MCG inhalation capsule Place 1 capsule (18 mcg total) into inhaler and inhale daily. 08/10/19  Yes Loletha Grayer, MD    Review of Systems  Constitutional: Negative for fatigue.  HENT: Negative for congestion, postnasal drip and sore throat.   Eyes: Negative.   Respiratory: Positive for cough (dry) and shortness of breath. Negative for chest tightness.   Cardiovascular: Negative for chest pain, palpitations and leg swelling.  Gastrointestinal: Negative for abdominal distention and abdominal pain.  Endocrine: Negative.   Musculoskeletal: Negative for back pain and neck pain.  Skin: Negative.   Allergic/Immunologic: Negative.   Neurological: Negative for dizziness and light-headedness.  Hematological: Negative for adenopathy. Does not bruise/bleed easily.  Psychiatric/Behavioral: Negative for dysphoric mood and sleep disturbance (sleeping on 2 pillows). The patient is not nervous/anxious.    Vitals:   08/25/19 1320  BP: (!) 143/106  Pulse: (!) 102  Resp: 18  SpO2: 97%  Weight: 195 lb 3.2 oz (88.5 kg)  Height: 5\' 8"  (1.727 m)   Wt Readings from Last 3 Encounters:  08/25/19 195 lb 3.2 oz (88.5 kg)  08/12/19 192 lb 3.2 oz (87.2 kg)  08/10/19 193 lb 8 oz (87.8 kg)   Lab Results  Component Value Date   CREATININE 0.95 08/09/2019   CREATININE 1.15 08/08/2019   CREATININE 1.20 08/07/2019   Physical Exam Vitals and nursing note reviewed.  Constitutional:      Appearance: Normal appearance.  HENT:     Head: Normocephalic and atraumatic.  Neck:     Vascular: No carotid bruit.  Cardiovascular:     Rate and Rhythm: Normal rate and regular rhythm.  Pulmonary:     Effort: Pulmonary effort is normal. No respiratory distress.     Breath sounds: No wheezing or rales.  Abdominal:     General: Abdomen is flat. There is no distension.     Palpations:  Abdomen is soft.  Musculoskeletal:        General: No tenderness.     Cervical back: Normal range of motion and neck supple.     Right lower leg: No edema.     Left lower leg: Edema (trace pitting) present.  Skin:    General: Skin is warm and dry.  Neurological:     General: No focal deficit present.     Mental Status: He is alert and oriented to person, place, and time.  Psychiatric:        Mood and Affect: Mood normal.        Behavior: Behavior normal.    Assessment & Plan:  1: Chronic heart failure with reduced ejection fraction- - NYHA class II - euvolemic today - weighing daily and reinforced to call for an overnight weight gain of >2 pounds or a weekly weight gain of >5 pounds -  weight up 3 pounds from last visit here 2 weeks ago - not adding salt and has been trying to read food labels; reviewed the importance of closely following a 2000mg  sodium diet  - missed cardiology Brion Aliment) earlier today 08/25/2019 due to transportation issues; this has been R/S to 09/09/19 - will increase entresto to 49/51mg ; verbal and written instructions given to finish his current bottle by taking 2 tablets twice daily and then when he begins the new bottle, he will take 1 tablet twice daily - referral placed to paramedicine program & message sent regarding patient needing assistance with a medication system that will work for him - BNP 08/03/2019 was 2085.0 - patient says that he's received his flu vaccine for this season  2: HTN- - BP elevated today but he's coughing quite a bit; entresto being increased above; will message cardiology and ask them to check labs at his appt on 09/09/19 - does not have a PCP as he's uninsured - BMP on 08/09/2019 reviewed and showed sodium 141, potassium 3.7, creatinine 0.95 and GFR >60  3: COPD-  - using nebulizer and inhalers - smoking 1 cigarette ~ every month  Medications were reviewed but pill box is incorrect and he has bottles of meds he doesn't take and  bottles he didn't bring.   Since he's seeing cardiology in 2 weeks, will see patient back here in 1 month

## 2019-08-31 ENCOUNTER — Telehealth (HOSPITAL_COMMUNITY): Payer: Self-pay

## 2019-08-31 NOTE — Telephone Encounter (Signed)
Contacted Kevin [name] to make first home visit.  No answer, left message and will continue to contact if ne return phone call is made.   [name] EMT-Paramedic [phone]

## 2019-09-08 ENCOUNTER — Other Ambulatory Visit (HOSPITAL_COMMUNITY): Payer: Self-pay

## 2019-09-08 ENCOUNTER — Other Ambulatory Visit: Payer: Self-pay | Admitting: Family

## 2019-09-08 ENCOUNTER — Encounter (HOSPITAL_COMMUNITY): Payer: Self-pay

## 2019-09-08 ENCOUNTER — Telehealth (HOSPITAL_COMMUNITY): Payer: Self-pay | Admitting: Licensed Clinical Social Worker

## 2019-09-08 MED ORDER — ALBUTEROL SULFATE HFA 108 (90 BASE) MCG/ACT IN AERS: 2.0000 | INHALATION_SPRAY | Freq: Four times a day (QID) | RESPIRATORY_TRACT | 3 refills | Status: DC | PRN

## 2019-09-08 MED ORDER — TIOTROPIUM BROMIDE MONOHYDRATE 18 MCG IN CAPS: 18.0000 ug | ORAL_CAPSULE | Freq: Every day | RESPIRATORY_TRACT | 3 refills | Status: DC

## 2019-09-08 MED ORDER — FUROSEMIDE 40 MG PO TABS: 40.0000 mg | ORAL_TABLET | Freq: Two times a day (BID) | ORAL | 5 refills | Status: DC

## 2019-09-08 MED ORDER — SPIRONOLACTONE 25 MG PO TABS: 25.0000 mg | ORAL_TABLET | Freq: Every day | ORAL | 5 refills | Status: DC

## 2019-09-08 MED ORDER — CARVEDILOL 25 MG PO TABS: 25.0000 mg | ORAL_TABLET | Freq: Two times a day (BID) | ORAL | 3 refills | Status: DC

## 2019-09-08 MED ORDER — SACUBITRIL-VALSARTAN 49-51 MG PO TABS: 1.0000 | ORAL_TABLET | Freq: Two times a day (BID) | ORAL | 3 refills | Status: DC

## 2019-09-08 MED ORDER — GABAPENTIN 300 MG PO CAPS: 300.0000 mg | ORAL_CAPSULE | Freq: Two times a day (BID) | ORAL | 5 refills | Status: DC

## 2019-09-08 MED ORDER — BREO ELLIPTA 200-25 MCG/INH IN AEPB: 1.0000 | INHALATION_SPRAY | Freq: Every day | RESPIRATORY_TRACT | 3 refills | Status: DC

## 2019-09-08 MED ORDER — AMLODIPINE BESYLATE 10 MG PO TABS: 10.0000 mg | ORAL_TABLET | Freq: Every day | ORAL | 5 refills | Status: DC

## 2019-09-08 NOTE — Telephone Encounter (Signed)
CSW consulted to assist with pt transportation to MD appt tomorrow.  CSW called pt and confirmed he is able to attend appt and needs transport- CSW requested ride through [employer].  CSW had also been trying to contact patient regarding status with switching over Medicaid from [place] to [place].  Pt states he has not contacted his [place] case worker to [place]- CSW encouraged him to try and locate his case workers information.  Community paramedic helped set up a in home social work consult to assist with this process.  CSW also inquired about disability- pt reports he has never been on disability and that finances are often hard.  As patient qualifies for Medicaid and has for several years he would also likely qualify for disability.  CSW called [employer] and connected pt once I had a representative- we were able to get him an appt on Tuesday, January 26th at 11am for him to apply for disability.  CSW will continue to follow and assist as needed,  Burna Sis, LCSW Clinical Social Worker [facility] Desk#: [phone] Cell#: [phone]

## 2019-09-08 NOTE — Progress Notes (Signed)
Today was first home visit with Kevin [name].  Discussed the program with him and he states he wants to be part of it. Non compliant with meds, has had refills since last April and still taking meds from same bottle except for sample bottle given from [name] with [name] in December, he states been taking them.  He is almost out of them.  He has cardiology appt tomorrow with Kevin [name] and he will need transportation.  He assumed he will be getting a car since [name] provided for their appt. Kevin [name] with [name] has set up transportation for him.  Kevin [name] with [name] has sent his medications to Medication management for him to pick up tomorrow after his cardiology appt.   Will send Kevin [name] a message about non compliant with meds.  He has no income, no insurance.  He needs a Child psychotherapist, will contact [name] to see if they can provide that for him since he on their service right now.  [name] they could send a Child psychotherapist to his home with an order, will advise Kevin [name].  He states scale was not working, went over how to use the scale and he weighed.  Advise him to weigh everyday and write it down for me.  He has some swelling in ankle area, lungs were clear, he has a cough, non productive.  He denies chest pain, headaches or dizziness.  He states gets short of breath with exertion.  Explained to him how medication management works.  He states was homeless in [place] but when covid happened things changed there and he came back to [facility].  He lives with his mother. Will continue to try to help and visit for Heart Failure, diet and medication management.   Kevin [name] [place] EMT-Paramedic [phone]

## 2019-09-09 ENCOUNTER — Encounter: Payer: Self-pay | Admitting: Nurse Practitioner

## 2019-09-09 ENCOUNTER — Ambulatory Visit (INDEPENDENT_AMBULATORY_CARE_PROVIDER_SITE_OTHER): Payer: PRIVATE HEALTH INSURANCE | Admitting: Nurse Practitioner

## 2019-09-09 ENCOUNTER — Other Ambulatory Visit: Payer: Self-pay

## 2019-09-09 VITALS — BP 140/100 | HR 67 | Ht 68.0 in | Wt 185.0 lb

## 2019-09-09 DIAGNOSIS — Z720 Tobacco use: Secondary | ICD-10-CM

## 2019-09-09 DIAGNOSIS — J90 Pleural effusion, not elsewhere classified: Secondary | ICD-10-CM

## 2019-09-09 DIAGNOSIS — I10 Essential (primary) hypertension: Secondary | ICD-10-CM

## 2019-09-09 DIAGNOSIS — I5042 Chronic combined systolic (congestive) and diastolic (congestive) heart failure: Secondary | ICD-10-CM

## 2019-09-09 DIAGNOSIS — I428 Other cardiomyopathies: Secondary | ICD-10-CM

## 2019-09-09 DIAGNOSIS — I5022 Chronic systolic (congestive) heart failure: Secondary | ICD-10-CM

## 2019-09-09 MED ORDER — HYDRALAZINE HCL 25 MG PO TABS: 25.0000 mg | ORAL_TABLET | Freq: Three times a day (TID) | ORAL | 3 refills | Status: DC

## 2019-09-09 NOTE — Patient Instructions (Signed)
Medication Instructions:  1- Entresto Samples 2- STOP Amlodipine 3- CONTINUE Hydralazine 1 tablet ( 25 mg total) 3 times daily.  *If you need a refill on your cardiac medications before your next appointment, please call your pharmacy*  Lab Work: Your physician recommends that you have lab work today(BMET)  If you have labs (blood work) drawn today and your tests are completely normal, you will receive your results only by: Marland Kitchen MyChart Message (if you have MyChart) OR . A paper copy in the mail If you have any lab test that is abnormal or we need to change your treatment, we will call you to review the results.  Testing/Procedures: None ordered   1- I will contact social work with [name] to help with medication management.  We are recommending the COVID-19 vaccine to all of our patients. Cardiac medications (including blood thinners) should not deter anyone from being vaccinated and there is no need to hold any of those medications prior to vaccine administration.     Currently, there is a hotline to call (active 09/03/19) to schedule vaccination appointments as no walk-ins will be accepted.   Number: [phone]    If you have further questions or concerns about the vaccine process, please visit [email or web address] or contact your primary care physician.   Follow-Up: At [facility], you and your health needs are our priority.  As part of our continuing mission to provide you with exceptional heart care, we have created designated Provider Care Teams.  These Care Teams include your primary Cardiologist (physician) and Advanced Practice Providers (APPs -  Physician Assistants and Nurse Practitioners) who all work together to provide you with the care you need, when you need it.  Your next appointment:   6 week(s)  The format for your next appointment:   In Person  Provider:    You may see Debbe Odea, MD or Nicolasa Ducking, NP.

## 2019-09-09 NOTE — Progress Notes (Signed)
Office Visit    Patient Name: Kevin [name] Date of Encounter: 09/09/2019  Primary Care Provider:  Patient, No Pcp Per Primary Cardiologist:  Kate Sable, MD  Chief Complaint    64 year old male with a history of nonischemic cardiomyopathy, HFrEF with an EF of 25-30%, hypertension, diabetes, tobacco abuse, and COPD, who presents for follow-up of heart failure.  Past Medical History    Past Medical History:  Diagnosis Date  . Chronic combined systolic (congestive) and diastolic (congestive) heart failure ([place])    a. 2019 reported neg stress test and nl cors on cath in [facility]; b. 07/2019 Echo: EF 25-30%, Gr2 DD, glob HK. Mod reduced RV fxn w/ volume overload. Mod dil RA. Mild to mod TR. Mod elev PASP.  Marland Kitchen COPD (chronic obstructive pulmonary disease) ([place])   . Diabetes mellitus without complication ([place])   . Hypertension   . Noncompliance   . Pleural effusion on right    a. 07/2019 Thoracentesis: 300 ml.   History reviewed. No pertinent surgical history.  Allergies  No Known Allergies  History of Present Illness    64 year old male with the above past medical history including nonischemic cardiomyopathy, HFrEF, hypertension, diabetes, tobacco abuse, and COPD.  Patient previously reported to [place] that he was diagnosed with congestive heart failure while hospitalized in [place] at some point in 2019.  Reported having had a prior stress test and heart catheterization, both of which apparently were normal.  He was placed on medical therapy and moved to [place] in the spring 2020 but since his arrival, was having trouble filling his medications and was off of his medicines for roughly 6 months.  He presented to the emergency department in early December with worsening cough and lower extremity edema and was prescribed oral Lasix and discharged.  Unfortunately, he required readmission on December eighth due to progressive dyspnea and cough.  He was volume overloaded and  required a week of IV diuresis.  Echocardiogram during admission showed an EF of 25-30% with grade 2 diastolic dysfunction, global hypokinesis, moderate reduced RV function, moderately elevated PSAP, and mild to moderate TR.  He was also noted to have a moderate size right layering pleural effusion and underwent thoracentesis with removal of 300 mL during admission.  He did require a course of steroids for COPD flare as well.  He was placed on carvedilol, Entresto, and spironolactone and subsequently discharged home on Lasix 40 mg daily as well.  He was seen in heart failure clinic on December 30, at which time he reported mild, stable dyspnea on exertion.  He did not have all of his medications with him and he was not entirely sure what he was taking.  Referral was placed for para medicine and he has since been seen by the paramedics and team.  Patient says he has been feeling well for the most part but frequently coughs at night.  We went over all of his medicines in detail today.  In addition to taking carvedilol, Entresto, spironolactone, and furosemide, he is also taking hydralazine 25 mg 3 times daily.  This was prescribed prior to his admission in December.  He weighs himself but says he thinks his scale is broken because the number always stops at 13.  I recommended he check the settings because it sounds like it is set to stone.  He is 185 on our scale today, which would correlate pretty closely with his weight and stone.  Regardless, his weight is down  10 pounds from his heart failure clinic visit in December.  Other than coughing, he again reports chronic, stable dyspnea on exertion.  He denies chest pain, palpitations, PND, orthopnea, dizziness, syncope, edema, or early satiety.  Home Medications    Prior to Admission medications   Medication Sig Start Date End Date Taking? Authorizing Provider  acetaminophen (TYLENOL) 325 MG tablet Take 650 mg by mouth every 6 (six) hours as needed for mild pain  or fever.    Yes [provider]  albuterol (VENTOLIN HFA) 108 (90 Base) MCG/ACT inhaler Inhale 2 puffs into the lungs every 6 (six) hours as needed for wheezing or shortness of breath. 09/08/19  Yes Hackney, Inetta Fermo A, FNP  calcium carbonate (TUMS - DOSED IN MG ELEMENTAL CALCIUM) 500 MG chewable tablet Chew 1 tablet by mouth daily.   Yes [provider]  carvedilol (COREG) 25 MG tablet Take 1 tablet (25 mg total) by mouth 2 (two) times daily. 09/08/19  Yes Hackney, Tina A, FNP  fluticasone furoate-vilanterol (BREO ELLIPTA) 200-25 MCG/INH AEPB Inhale 1 puff into the lungs daily. 09/08/19  Yes Clarisa [name] A, FNP  furosemide (LASIX) 40 MG tablet Take 1 tablet (40 mg total) by mouth 2 (two) times daily. 09/08/19  Yes Clarisa [name] A, FNP  gabapentin (NEURONTIN) 300 MG capsule Take 1 capsule (300 mg total) by mouth 2 (two) times daily. 09/08/19  Yes Clarisa [name] A, FNP  hydrALAZINE (APRESOLINE) 25 MG tablet Take 1 tablet (25 mg total) by mouth 3 (three) times daily. 09/09/19 12/08/19 Yes Creig Hines, NP  ipratropium-albuterol (DUONEB) 0.5-2.5 (3) MG/3ML SOLN Take 3 mLs by nebulization every 6 (six) hours as needed.   Yes [provider]  sacubitril-valsartan (ENTRESTO) 49-51 MG Take 1 tablet by mouth 2 (two) times daily. 09/08/19  Yes Clarisa [name] A, FNP  spironolactone (ALDACTONE) 25 MG tablet Take 1 tablet (25 mg total) by mouth daily. 09/08/19  Yes Clarisa [name] A, FNP  tiotropium (SPIRIVA) 18 MCG inhalation capsule Place 1 capsule (18 mcg total) into inhaler and inhale daily. 09/08/19  Yes Delma Freeze, FNP    Review of Systems    Chronic, stable dyspnea on exertion.  He continues to cough, especially when he lays down for bed at night.  He denies chest pain, palpitations, PND, orthopnea, dizziness, syncope, edema, or early satiety.  All other systems reviewed and are otherwise negative except as noted above.  Physical Exam    VS:  BP (!) 140/100 (BP Location:  Left Arm, Patient Position: Sitting, Cuff Size: Normal)   Pulse 67   Ht 5\' 8"  (1.727 m)   Wt 185 lb (83.9 kg)   SpO2 96%   BMI 28.13 kg/m  , BMI Body mass index is 28.13 kg/m.  ReDS Vest =  41 GEN: Well nourished, well developed, in no acute distress. HEENT: normal. Neck: Supple, no JVD, carotid bruits, or masses. Cardiac: RRR, no murmurs, rubs, or gallops. No clubbing, cyanosis, edema.  Radials/PT 2+ and equal bilaterally.  Respiratory:  Respirations regular and unlabored, clear to auscultation bilaterally. GI: Soft, nontender, nondistended, BS + x 4. MS: no deformity or atrophy. Skin: warm and dry, no rash. Neuro:  Strength and sensation are intact. Psych: Normal affect.  Accessory Clinical Findings    ECG personally reviewed by me today -regular sinus rhythm, 67, left axis deviation, incomplete left bundle, lateral T wave inversion- no acute changes.  Lab Results  Component Value Date   WBC 8.7 08/04/2019  HGB 10.9 (L) 08/04/2019   HCT 35.3 (L) 08/04/2019   MCV 90.3 08/04/2019   PLT 314 08/04/2019   Lab Results  Component Value Date   CREATININE 0.95 08/09/2019   BUN 28 (H) 08/09/2019   NA 141 08/09/2019   K 3.7 08/09/2019   CL 102 08/09/2019   CO2 30 08/09/2019   Lab Results  Component Value Date   ALT 40 08/06/2019   AST 27 08/06/2019   ALKPHOS 158 (H) 08/06/2019   BILITOT 1.0 08/06/2019    Lab Results  Component Value Date   HGBA1C 7.2 (H) 08/07/2019   Assessment & Plan    1.  Chronic combined systolic and diastolic congestive heart failure/nonischemic cardiomyopathy: Apparently diagnosed with nonischemic cardiomyopathy at some point 2019 in [place], [place].  Patient reported stress testing and catheterization at that time.  No records available.  Apparently seen at [facility].  He moved to the [place] area in the spring 2020 and following being off of medications for approximately 6 months, he was admitted to [facility] in early  September with heart failure requiring 7 days of IV diuresis.  Echo during admission showed an EF of 25-30% with global hypokinesis.  Patient's weight is down 10 pounds since his heart failure clinic follow-up visit in late December.  He reports better compliance with medications.  He is currently taking carvedilol 25 mg twice daily, Entresto 49-51 mg daily, spironolactone 12.5 mg daily, Lasix 40 mg daily, and also hydralazine 25 mg 3 times daily.  The hydralazine was prescribed prior to his hospitalization and was not continued at discharge however, he has kept the bottle in his bag and has continued to take it.  As his blood pressure is elevated, I advised him to continue.  Further, his only been taking Entresto once a day.  I have advised him to take this twice a day.  Though on exam, he appears euvolemic, and his weight is down, I did apply a ReDS Vest to establish a baseline.  It came back at 41%.  I will follow-up a basic metabolic panel today.  If renal function is stable, I will plan to have him double up his Lasix for a few days.  He has follow-up in heart failure clinic in approximately 2 weeks.  If he continues to have cough and elevated ReDS Vest reading at that time, we may wish to consider a follow-up chest x-ray to reevaluate right-sided pleural effusion.  Notably, breath sounds were clear bilaterally today.  We discussed the importance of daily weights, sodium restriction, medication compliance, and symptom reporting and he verbalizes understanding.   2.  Right pleural effusion: Status post thoracentesis in December.  Patient has had a cough ever since that hospitalization.  It is only slightly better on inhaled steroids.  His lungs are clear on examination today.  As above, will follow-up basic metabolic panel today and in the setting of an elevated ReDS Vest reading, may titrate diuretic dose.  We will need to consider repeat chest x-ray to reevaluate pleural effusion if he does not respond to  diuresis.  3.  Essential hypertension: Blood pressure elevated today.  He has only been taking Entresto once a day and I have encouraged him to take this twice a day.  If pressure remains elevated at follow-up, we will certainly titrate Entresto further as long as renal function stable.  4.  Tobacco abuse/COPD: He says he is no longer smoking but has been tempted.  I congratulated  him on remaining off of cigarettes and encouraged him to remain off of cigarettes.  He remains on inhaled steroid and albuterol therapy.  5.  Type 2 diabetes mellitus: A1c was 7.2 in December.  He was noted to have hyperglycemia during hospitalization though this was felt to be secondary to steroids.  This will need to be followed up by primary care as he is not currently on any therapy for diabetes.  6.  Noncompliance: Patient has had some confusion about his medications and we went over these in detail today.  He now has para- medicine coming out to his house to help him and I will also place an order for social work through [name].  7.  Disposition: Follow-up basic metabolic panel today.  He is follow-up in heart failure clinic approximately 2 weeks and I will arrange for follow-up here in about 6 weeks.  Nicolasa Ducking, NP 09/09/2019, 3:29 PM

## 2019-09-10 LAB — BASIC METABOLIC PANEL
BUN/Creatinine Ratio: 16 (ref 10–24)
BUN: 17 mg/dL (ref 8–27)
CO2: 27 mmol/L (ref 20–29)
Calcium: 8.6 mg/dL (ref 8.6–10.2)
Chloride: 102 mmol/L (ref 96–106)
Creatinine, Ser: 1.05 mg/dL (ref 0.76–1.27)
GFR calc Af Amer: 88 mL/min/{1.73_m2} (ref >59)
GFR calc non Af Amer: 76 mL/min/{1.73_m2} (ref >59)
Glucose: 90 mg/dL (ref 65–99)
Potassium: 3.7 mmol/L (ref 3.5–5.2)
Sodium: 143 mmol/L (ref 134–144)

## 2019-09-14 ENCOUNTER — Telehealth (HOSPITAL_COMMUNITY): Payer: Self-pay

## 2019-09-14 NOTE — Telephone Encounter (Signed)
Was contacted by Rosetta Posner from [facility] [employer] [facility] advising Jarrid was out of food.  Contacted Awad and advised him to go to [employer] the food pantry is open from 1 to 3 today.  This will be the closest food pantry to him, he states will get a neighbor to drive him.  Will follow up with him to make sure he did receive some food.   [name] [place] EMT-Paramedic [phone]

## 2019-09-15 ENCOUNTER — Telehealth (HOSPITAL_COMMUNITY): Payer: Self-pay | Admitting: Licensed Clinical Social Worker

## 2019-09-15 NOTE — Telephone Encounter (Signed)
CSW received call back from [facility] case worker Colonel Bald.  CSW able to connect pt and case worker on the phone to [place] so we can work towards applying for [facility] Medicaid.  Patient will officially not have [place] on 09/27/19 and then will be free to apply for [place] coverage  CSW will continue to follow and assist as needed  Burna Sis, LCSW Clinical Social Worker [facility] Desk#: [phone] Cell#: [phone]

## 2019-09-15 NOTE — Telephone Encounter (Addendum)
CSW called pt [place] case worker, Elsie Saas [phone], to inquire about [place] as patient will need to stop [place] prior to applying for [facility] Medicaid.  Unable to reach- left voicemail  Pt also needs help with transport to appt next Thursday at [facility].  CSW set up through [facility]- confirmed they are not currently charging for transport due to COVID.  Pt informed of transport and provided with number to call for pick up when done with his appt.  CSW will continue to follow and assist as needed  Burna Sis, LCSW Clinical Social Worker [facility] Desk#: [phone] Cell#: [phone]

## 2019-09-21 ENCOUNTER — Telehealth (HOSPITAL_COMMUNITY): Payer: Self-pay

## 2019-09-21 NOTE — Telephone Encounter (Signed)
Spoke with Kevin [name] and discussed his up coming appts and how [facility] transportation works.  He appears to understand.  He was confused, thougth he missed appt today.  Asked about his medications, verified them and he states he is taking them right now.  He said he has not seen a Child psychotherapist with [name] yet.  Contacted [facility] and they discharged him on the 12th, 2 days prior to Dr Brion Aliment appt so they can not send a referral.  He is also [place] case and [name] stated he only gets so many visits.  Will contact Rosetta Posner at [facility] and make her aware.  Will continue to visit for Heart Failure.   [employer] [phone]

## 2019-09-22 ENCOUNTER — Telehealth (HOSPITAL_COMMUNITY): Payer: Self-pay | Admitting: Licensed Clinical Social Worker

## 2019-09-22 NOTE — Telephone Encounter (Signed)
CSW called pt to follow up regarding multiple things.  First CSW inquired for [employer] appt for disability went on Tuesday- pt reports that he was able to speak with them over the phone and that they will be sending him further paperwork by mail.  CSW then inquired about pt appt tomorrow- pt able to verbalize correct appt information and states he has been contacted by [facility] and informed they will be picking him up at 11:30am for that appt.  CSW started to ask about attempting to call DSS again today to see about food stamps and Medicaid but pt states he was able to go by DSS in person today and though they wouldn't see him in person they were able to provide him with the correct paperwork for him to fill out- per patient he applied for emergency food stamps and should hear in a week regarding that application.  CSW will continue to follow and assist as needed  Burna Sis, LCSW Clinical Social Worker [facility] Desk#: [phone] Cell#: [phone]

## 2019-09-23 ENCOUNTER — Encounter: Payer: Self-pay | Admitting: Family

## 2019-09-23 ENCOUNTER — Ambulatory Visit: Payer: Medicaid - Out of State | Attending: Family | Admitting: Family

## 2019-09-23 ENCOUNTER — Other Ambulatory Visit: Payer: Self-pay

## 2019-09-23 VITALS — BP 98/65 | HR 59 | Resp 18 | Ht 68.0 in | Wt 182.4 lb

## 2019-09-23 DIAGNOSIS — E119 Type 2 diabetes mellitus without complications: Secondary | ICD-10-CM | POA: Insufficient documentation

## 2019-09-23 DIAGNOSIS — I5022 Chronic systolic (congestive) heart failure: Secondary | ICD-10-CM

## 2019-09-23 DIAGNOSIS — J449 Chronic obstructive pulmonary disease, unspecified: Secondary | ICD-10-CM | POA: Insufficient documentation

## 2019-09-23 DIAGNOSIS — Z7951 Long term (current) use of inhaled steroids: Secondary | ICD-10-CM | POA: Insufficient documentation

## 2019-09-23 DIAGNOSIS — Z87891 Personal history of nicotine dependence: Secondary | ICD-10-CM | POA: Diagnosis not present

## 2019-09-23 DIAGNOSIS — Z79899 Other long term (current) drug therapy: Secondary | ICD-10-CM | POA: Diagnosis not present

## 2019-09-23 DIAGNOSIS — I10 Essential (primary) hypertension: Secondary | ICD-10-CM

## 2019-09-23 DIAGNOSIS — I5042 Chronic combined systolic (congestive) and diastolic (congestive) heart failure: Secondary | ICD-10-CM | POA: Insufficient documentation

## 2019-09-23 DIAGNOSIS — I110 Hypertensive heart disease with heart failure: Secondary | ICD-10-CM | POA: Insufficient documentation

## 2019-09-23 NOTE — Patient Instructions (Addendum)
Continue weighing daily and call for an overnight weight gain of > 2 pounds or a weekly weight gain of >5 pounds.  Stop amlodipine

## 2019-09-23 NOTE — Progress Notes (Signed)
Patient ID: Kevin [name], male    DOB: Nov 23, 1955, 64 y.o.   MRN: [id number]  HPI  Mr Cinquemani is a 64 y/o male with a history of HTN, COPD, tobacco use and chronic heart failure.   Echo report from 08/04/2019 reviewed and showed an EF of 25-30% along with mild/moderate TR.   Admitted 08/03/2019 due to acute on chronic HF. Cardiology consult obtained. Initially given IV lasix and then transitioned to oral diuretics. Right thoracentesis done with removal of . Oral medications adjusted for his HTN. Given solumedrol for COPD. Discharged after 7 days. Was in the ED 07/29/2019 due to mild HF exacerbation where he was treated and released.   He presents today for a follow-up visit with a chief complaint of minimal shortness of breath upon moderate exertion. He describes this as chronic in nature having been present for several months. He has associated cough (although much better) along with this. He denies any difficulty sleeping, abdominal distention, palpitations, pedal edema, chest pain, dizziness, fatigue or weight gain.   Says that he has his scale working correctly now.   Past Medical History:  Diagnosis Date  . Chronic combined systolic (congestive) and diastolic (congestive) heart failure ([facility])    a. 2019 reported neg stress test and nl cors on cath in [facility]; b. 07/2019 Echo: EF 25-30%, Gr2 DD, glob HK. Mod reduced RV fxn w/ volume overload. Mod dil RA. Mild to mod TR. Mod elev PASP.  Marland Kitchen COPD (chronic obstructive pulmonary disease) ([facility])   . Diabetes mellitus without complication ([facility])   . Hypertension   . Noncompliance   . Pleural effusion on right    a. 07/2019 Thoracentesis: 300 ml.    Social History   Tobacco Use  . Smoking status: Former Games developer  . Smokeless tobacco: Never Used  Substance Use Topics  . Alcohol use: Never   No Known Allergies  Prior to Admission medications   Medication Sig Start Date End Date Taking? Authorizing Provider  acetaminophen (TYLENOL) 325 MG  tablet Take 650 mg by mouth every 6 (six) hours as needed for mild pain or fever.    Yes [provider]  albuterol (VENTOLIN HFA) 108 (90 Base) MCG/ACT inhaler Inhale 2 puffs into the lungs every 6 (six) hours as needed for wheezing or shortness of breath. 09/08/19  Yes Sheddrick Lattanzio, Inetta Fermo A, FNP  calcium carbonate (TUMS - DOSED IN MG ELEMENTAL CALCIUM) 500 MG chewable tablet Chew 1 tablet by mouth daily.   Yes [provider]  carvedilol (COREG) 25 MG tablet Take 1 tablet (25 mg total) by mouth 2 (two) times daily. 09/08/19  Yes Rasmus Preusser A, FNP  fluticasone furoate-vilanterol (BREO ELLIPTA) 200-25 MCG/INH AEPB Inhale 1 puff into the lungs daily. 09/08/19  Yes Clarisa [name] A, FNP  furosemide (LASIX) 40 MG tablet Take 1 tablet (40 mg total) by mouth 2 (two) times daily. 09/08/19  Yes Clarisa [name] A, FNP  gabapentin (NEURONTIN) 300 MG capsule Take 1 capsule (300 mg total) by mouth 2 (two) times daily. 09/08/19  Yes Clarisa [name] A, FNP  hydrALAZINE (APRESOLINE) 25 MG tablet Take 1 tablet (25 mg total) by mouth 3 (three) times daily. 09/09/19 12/08/19 Yes Creig Hines, NP  ipratropium-albuterol (DUONEB) 0.5-2.5 (3) MG/3ML SOLN Take 3 mLs by nebulization every 6 (six) hours as needed.   Yes [provider]  sacubitril-valsartan (ENTRESTO) 49-51 MG Take 1 tablet by mouth 2 (two) times daily. 09/08/19  Yes Delma Freeze, FNP  spironolactone (  ALDACTONE) 25 MG tablet Take 1 tablet (25 mg total) by mouth daily. 09/08/19  Yes Clarisa [name] A, FNP  tiotropium (SPIRIVA) 18 MCG inhalation capsule Place 1 capsule (18 mcg total) into inhaler and inhale daily. 09/08/19  Yes Delma Freeze, FNP     Review of Systems  Constitutional: Negative for appetite change and fatigue.  HENT: Negative for congestion, postnasal drip and sore throat.   Eyes: Negative.   Respiratory: Positive for cough (improving) and shortness of breath (improving). Negative for chest tightness.    Cardiovascular: Negative for chest pain, palpitations and leg swelling.  Gastrointestinal: Negative for abdominal distention and abdominal pain.  Endocrine: Negative.   Genitourinary: Negative.   Musculoskeletal: Negative for back pain and neck pain.  Skin: Negative.   Allergic/Immunologic: Negative.   Neurological: Negative for dizziness and light-headedness.  Hematological: Negative for adenopathy. Does not bruise/bleed easily.  Psychiatric/Behavioral: Negative for dysphoric mood and sleep disturbance (sleeping on 2 pillows). The patient is not nervous/anxious.    Vitals:   09/23/19 1259  BP: 98/65  Pulse: (!) 59  Resp: 18  SpO2: 100%  Weight: 182 lb 6.4 oz (82.7 kg)  Height: 5\' 8"  (1.727 m)   Wt Readings from Last 3 Encounters:  09/23/19 182 lb 6.4 oz (82.7 kg)  09/09/19 185 lb (83.9 kg)  09/08/19 190 lb (86.2 kg)   Lab Results  Component Value Date   CREATININE 1.05 09/09/2019   CREATININE 0.95 08/09/2019   CREATININE 1.15 08/08/2019    Physical Exam Vitals and nursing note reviewed.  Constitutional:      Appearance: Normal appearance.  HENT:     Head: Normocephalic and atraumatic.  Neck:     Vascular: No carotid bruit.  Cardiovascular:     Rate and Rhythm: Normal rate and regular rhythm.  Pulmonary:     Effort: Pulmonary effort is normal. No respiratory distress.     Breath sounds: No wheezing or rales.  Abdominal:     General: Abdomen is flat. There is no distension.     Palpations: Abdomen is soft.  Musculoskeletal:        General: No tenderness.     Cervical back: Normal range of motion and neck supple.     Right lower leg: No tenderness. No edema.     Left lower leg: No tenderness. No edema.  Skin:    General: Skin is warm and dry.  Neurological:     General: No focal deficit present.     Mental Status: He is alert and oriented to person, place, and time.  Psychiatric:        Mood and Affect: Mood normal.        Behavior: Behavior normal.     Assessment & Plan:  1: Chronic heart failure with reduced ejection fraction- - NYHA class II - euvolemic today - weighing daily and reinforced to call for an overnight weight gain of >2 pounds or a weekly weight gain of >5 pounds - weight down 13 pounds from last visit here 1 month ago - not adding salt and has been trying to read food labels; reviewed the importance of closely following a 2000mg  sodium diet  - saw cardiology 08/10/2019) 09/09/19 - he says that his cough is "much better"; rarely coughs while in the office today - current BP does not allow entresto titration - participating in paramedicine program - has been in frequent contact with social worker from [name] in [place] - BNP 08/03/2019 was 2085.0 -  patient says that he's received his flu vaccine for this season  2: HTN- - BP low today; will stop amlodipine today - does not have a PCP as he's uninsured - BMP on 09/09/19 reviewed and showed sodium 143, potassium 3.7, creatinine 1.05 and GFR 88  3: COPD-  - using nebulizer and inhalers - smoking 1 cigarette ~ every month  Medication bottles were reviewed. Stopping amlodipine today and a big X was put on the label along with written instructions on his AVS.   Sees [facility] cardiology 10/15/19 so will see patient in clinic March 2021 or sooner for any questions/problems before then.

## 2019-10-12 ENCOUNTER — Telehealth: Payer: Self-pay | Admitting: Licensed Clinical Social Worker

## 2019-10-12 NOTE — Telephone Encounter (Signed)
CSW received call from [facility] Paramedic that pt is inquiring about status of Medicaid application which he submitted on 09/22/19.  CSW called [employer] to inquire and they state they don't have it on file and to check with the local office.  CSW able to speak with representative at [facility] who also reports they don't have on file though pt submitted on same day as food stamp application which has been processed and approved.  They suggest pt pick up another application and submit again.  CSW emailed application to [employer] who will bring out to the patient.  CSW will continue to follow and assist as needed  Burna Sis, LCSW Clinical Social Worker [facility] Desk#: [phone] Cell#: [phone]

## 2019-10-13 ENCOUNTER — Encounter (HOSPITAL_COMMUNITY): Payer: Self-pay

## 2019-10-13 NOTE — Progress Notes (Signed)
Kevin [name] had emailed me paperwork for Kevin [name] to fill out to file for [employer] in [place].  Dropped the paperwork and he is aware to fill them out and take to local DSS office.  He states has all his medications and feeling well today.  Will continue to assist him in any way that I may help.   [name] [place] EMT-Paramedic [phone]

## 2019-10-15 ENCOUNTER — Ambulatory Visit (INDEPENDENT_AMBULATORY_CARE_PROVIDER_SITE_OTHER): Payer: PRIVATE HEALTH INSURANCE | Admitting: Cardiology

## 2019-10-15 ENCOUNTER — Other Ambulatory Visit: Payer: Self-pay

## 2019-10-15 ENCOUNTER — Encounter: Payer: Self-pay | Admitting: Cardiology

## 2019-10-15 VITALS — BP 110/80 | HR 54 | Ht 68.0 in | Wt 194.2 lb

## 2019-10-15 DIAGNOSIS — I10 Essential (primary) hypertension: Secondary | ICD-10-CM

## 2019-10-15 DIAGNOSIS — I428 Other cardiomyopathies: Secondary | ICD-10-CM

## 2019-10-15 DIAGNOSIS — I5042 Chronic combined systolic (congestive) and diastolic (congestive) heart failure: Secondary | ICD-10-CM

## 2019-10-15 DIAGNOSIS — F17200 Nicotine dependence, unspecified, uncomplicated: Secondary | ICD-10-CM

## 2019-10-15 MED ORDER — SACUBITRIL-VALSARTAN 97-103 MG PO TABS: 1.0000 | ORAL_TABLET | Freq: Two times a day (BID) | ORAL | 6 refills | Status: DC

## 2019-10-15 NOTE — Patient Instructions (Signed)
Medication Instructions:  - Your physician has recommended you make the following change in your medication:   1) Increase Entresto to 97/103 mg- take 1 tablet by mouth TWICE daily  2) Stop hydralazine  - please monitor your blood pressure at home - if you start to see your blood pressure numbers are going up, then please call the office to let Dr. Nelma Rothman know [phone]  *If you need a refill on your cardiac medications before your next appointment, please call your pharmacy*  Lab Work: - none ordered  If you have labs (blood work) drawn today and your tests are completely normal, you will receive your results only by: Marland Kitchen MyChart Message (if you have MyChart) OR . A paper copy in the mail If you have any lab test that is abnormal or we need to change your treatment, we will call you to review the results.  Testing/Procedures: - Your physician has requested that you have an echocardiogram in 3 months. Echocardiography is a painless test that uses sound waves to create images of your heart. It provides your doctor with information about the size and shape of your heart and how well your heart's chambers and valves are working. This procedure takes approximately one hour. There are no restrictions for this procedure.  Follow-Up: At [facility], you and your health needs are our priority.  As part of our continuing mission to provide you with exceptional heart care, we have created designated Provider Care Teams.  These Care Teams include your primary Cardiologist (physician) and Advanced Practice Providers (APPs -  Physician Assistants and Nurse Practitioners) who all work together to provide you with the care you need, when you need it.  Your next appointment:   3 month(s)/ after the echocardiogram is completed  The format for your next appointment:   In Person  Provider:   Debbe Odea, MD  Other Instructions n/a

## 2019-10-15 NOTE — Progress Notes (Signed)
Cardiology Office Note:    Date:  10/15/2019   ID:  Kevin [name], DOB 1955-12-01, MRN [id number]  PCP:  Patient, No Pcp Per  Cardiologist:  Kate Sable, MD  Electrophysiologist:  None   Referring MD: No ref. provider found   Chief Complaint  Patient presents with  . office visit    Pt has no complaints. Meds verbally reviewed w/ pt.    History of Present Illness:    Kevin [name] is a 64 y.o. male with a hx of hypertension, HFrEF last EF 25 to 30%, current smoker x20+ years, COPD who presents for follow-up.  Patient was admitted to the hospital with shortness of breath in December 2020.  He was not on heart failure medicines at the time due to moving from [place] and had some difficulty with refilling his meds.  He was appropriately diuresed and then discharged.  Last echocardiogram obtained on 07/2019 showed EF of 25-30%.  Left heart cath in 2019 showed no obstructive CAD.  Discharge she states feeling much better, denies shortness of breath, edema.  Past Medical History:  Diagnosis Date  . Chronic combined systolic (congestive) and diastolic (congestive) heart failure ([place])    a. 2019 reported neg stress test and nl cors on cath in [facility]; b. 07/2019 Echo: EF 25-30%, Gr2 DD, glob HK. Mod reduced RV fxn w/ volume overload. Mod dil RA. Mild to mod TR. Mod elev PASP.  Marland Kitchen COPD (chronic obstructive pulmonary disease) ([place])   . Diabetes mellitus without complication ([place])   . Hypertension   . Noncompliance   . Pleural effusion on right    a. 07/2019 Thoracentesis: 300 ml.    History reviewed. No pertinent surgical history.  Current Medications: Current Meds  Medication Sig  . acetaminophen (TYLENOL) 325 MG tablet Take 650 mg by mouth every 6 (six) hours as needed for mild pain or fever.   Marland Kitchen albuterol (VENTOLIN HFA) 108 (90 Base) MCG/ACT inhaler Inhale 2 puffs into the lungs every 6 (six) hours as needed for wheezing or shortness of breath.  . calcium carbonate (TUMS -  DOSED IN MG ELEMENTAL CALCIUM) 500 MG chewable tablet Chew 1 tablet by mouth daily.  . carvedilol (COREG) 25 MG tablet Take 1 tablet (25 mg total) by mouth 2 (two) times daily.  . fluticasone furoate-vilanterol (BREO ELLIPTA) 200-25 MCG/INH AEPB Inhale 1 puff into the lungs daily.  . furosemide (LASIX) 40 MG tablet Take 1 tablet (40 mg total) by mouth 2 (two) times daily.  Marland Kitchen gabapentin (NEURONTIN) 300 MG capsule Take 1 capsule (300 mg total) by mouth 2 (two) times daily.  Marland Kitchen spironolactone (ALDACTONE) 25 MG tablet Take 1 tablet (25 mg total) by mouth daily.  Marland Kitchen tiotropium (SPIRIVA) 18 MCG inhalation capsule Place 1 capsule (18 mcg total) into inhaler and inhale daily.  . [DISCONTINUED] hydrALAZINE (APRESOLINE) 25 MG tablet Take 1 tablet (25 mg total) by mouth 3 (three) times daily.  . [DISCONTINUED] sacubitril-valsartan (ENTRESTO) 49-51 MG Take 1 tablet by mouth 2 (two) times daily.     Allergies:   Patient has no known allergies.   Social History   Socioeconomic History  . Marital status: Single    Spouse name: Not on file  . Number of children: Not on file  . Years of education: Not on file  . Highest education level: Not on file  Occupational History  . Not on file  Tobacco Use  . Smoking status: Current Some Day Smoker    Packs/day: 0.25  Years: 20.00    Pack years: 5.00  . Smokeless tobacco: Never Used  . Tobacco comment: a pack lasts a week (appx. 4 days)  Substance and Sexual Activity  . Alcohol use: Never  . Drug use: Not Currently  . Sexual activity: Not Currently  Other Topics Concern  . Not on file  Social History Narrative   Patient moved to [place] from [place] in March of this year, to take care of his Mother. She lives in a community managed by her church, "independent living for seniors." Patient lives with his Mother, does not drive, and reports he mostly keeps to himself.   Social Determinants of Health   Financial Resource Strain: Medium Risk  .  Difficulty of Paying Living Expenses: Somewhat hard  Food Insecurity: Food Insecurity Present  . Worried About Programme researcher, broadcasting/film/video in the Last Year: Often true  . Ran Out of Food in the Last Year: Often true  Transportation Needs: No Transportation Needs  . Lack of Transportation (Medical): No  . Lack of Transportation (Non-Medical): No  Physical Activity: Sufficiently Active  . Days of Exercise per Week: 7 days  . Minutes of Exercise per Session: 60 min  Stress: No Stress Concern Present  . Feeling of Stress : Not at all  Social Connections: Moderately Isolated  . Frequency of Communication with Friends and Family: Three times a week  . Frequency of Social Gatherings with Friends and Family: Never  . Attends Religious Services: Never  . Active Member of Clubs or Organizations: No  . Attends Banker Meetings: Never  . Marital Status: Never married     Family History: Denies any family history of heart disease ROS:   Please see the history of present illness.     All other systems reviewed and are negative.  EKGs/Labs/Other Studies Reviewed:    The following studies were reviewed today:   EKG:  EKG is  ordered today.  The ekg ordered today demonstrates sinus bradycardia, heart rate 54  Recent Labs: 08/03/2019: B Natriuretic Peptide 2,085.0; Magnesium 1.9 08/04/2019: Hemoglobin 10.9; Platelets 314 08/06/2019: ALT 40 09/09/2019: BUN 17; Creatinine, Ser 1.05; Potassium 3.7; Sodium 143  Recent Lipid Panel No results found for: CHOL, TRIG, HDL, CHOLHDL, VLDL, LDLCALC, LDLDIRECT  Physical Exam:    VS:  BP 110/80 (BP Location: Left Arm, Patient Position: Sitting, Cuff Size: Normal)   Pulse (!) 54   Ht 5\' 8"  (1.727 m)   Wt 194 lb 4 oz (88.1 kg)   SpO2 98%   BMI 29.54 kg/m     Wt Readings from Last 3 Encounters:  10/15/19 194 lb 4 oz (88.1 kg)  09/23/19 182 lb 6.4 oz (82.7 kg)  09/09/19 185 lb (83.9 kg)     GEN:  Well nourished, well developed in no acute  distress HEENT: Normal NECK: No JVD; No carotid bruits LYMPHATICS: No lymphadenopathy CARDIAC: RRR, no murmurs, rubs, gallops RESPIRATORY:  Clear to auscultation without rales, wheezing or rhonchi  ABDOMEN: Soft, non-tender, non-distended MUSCULOSKELETAL:  No edema; No deformity  SKIN: Warm and dry NEUROLOGIC:  Alert and oriented x 3 PSYCHIATRIC:  Normal affect   ASSESSMENT:    1. NICM (nonischemic cardiomyopathy) (HCC)   2. Essential hypertension   3. Smoking   4. Chronic combined systolic and diastolic congestive heart failure (HCC)    PLAN:    In order of problems listed above:  1. Patient with nonischemic cardiomyopathy, ejection fraction 25 to 30%.  Increase Entresto  to 97/103, continue Coreg 25 twice daily, Aldactone 25 mg daily.  Hold hydralazine while increasing Entresto.  We will plan to repeat an echocardiogram in 3 months while on optimal medical therapy.  If ejection fraction is still less than 35%, we will plan to refer patient to EP for ICD consideration. 2. Blood pressure controlled.  Hold hydralazine for now.  Increase Entresto to 97/103, continue Coreg, continue spironolactone as prescribed. 3. Patient is still a current smoker.  Smoking cessation advised.  Over 5 minutes spent counseling patient.  Follow-up with me after echocardiogram in 3 months.  Total encounter time more than 45 minutes  Greater than 50% was spent in counseling and coordination of care with the patient.  Time spent counseling patient on ICD indications, smoking cessation, heart failure therapy, indications for ICD.    Medication Adjustments/Labs and Tests Ordered: Current medicines are reviewed at length with the patient today.  Concerns regarding medicines are outlined above.  Orders Placed This Encounter  Procedures  . EKG 12-Lead  . ECHOCARDIOGRAM COMPLETE   Meds ordered this encounter  Medications  . sacubitril-valsartan (ENTRESTO) 97-103 MG    Sig: Take 1 tablet by mouth 2 (two)  times daily.    Dispense:  60 tablet    Refill:  6    Patient Instructions  Medication Instructions:  - Your physician has recommended you make the following change in your medication:   1) Increase Entresto to 97/103 mg- take 1 tablet by mouth TWICE daily  2) Stop hydralazine  - please monitor your blood pressure at home - if you start to see your blood pressure numbers are going up, then please call the office to let Dr. Nelma Rothman know [phone]  *If you need a refill on your cardiac medications before your next appointment, please call your pharmacy*  Lab Work: - none ordered  If you have labs (blood work) drawn today and your tests are completely normal, you will receive your results only by: Marland Kitchen MyChart Message (if you have MyChart) OR . A paper copy in the mail If you have any lab test that is abnormal or we need to change your treatment, we will call you to review the results.  Testing/Procedures: - Your physician has requested that you have an echocardiogram in 3 months. Echocardiography is a painless test that uses sound waves to create images of your heart. It provides your doctor with information about the size and shape of your heart and how well your heart's chambers and valves are working. This procedure takes approximately one hour. There are no restrictions for this procedure.  Follow-Up: At [facility], you and your health needs are our priority.  As part of our continuing mission to provide you with exceptional heart care, we have created designated Provider Care Teams.  These Care Teams include your primary Cardiologist (physician) and Advanced Practice Providers (APPs -  Physician Assistants and Nurse Practitioners) who all work together to provide you with the care you need, when you need it.  Your next appointment:   3 month(s)/ after the echocardiogram is completed  The format for your next appointment:   In Person  Provider:   Debbe Odea,  MD  Other Instructions n/a     Signed, Debbe Odea, MD  10/15/2019 4:37 PM    [facility]

## 2019-11-01 ENCOUNTER — Telehealth (HOSPITAL_COMMUNITY): Payer: Self-pay | Admitting: Licensed Clinical Social Worker

## 2019-11-01 NOTE — Telephone Encounter (Signed)
CSW attempted to call pt and check in regarding medicaid/disability claim status- unable to reach- left voicemail requesting return call  Burna Sis, LCSW Clinical Social Worker [facility] Desk#: [phone] Cell#: [phone]

## 2019-11-08 ENCOUNTER — Telehealth (HOSPITAL_COMMUNITY): Payer: Self-pay

## 2019-11-08 NOTE — Telephone Encounter (Signed)
Attempted to contact, spoke with mother and she said he was outside.  She is unable to get him.  Will call back.   [name] [place] EMT-Paramedic [phone]

## 2019-11-09 NOTE — Progress Notes (Signed)
Patient ID: Kevin [name], male    DOB: 11/10/1955, 64 y.o.   MRN: [id number]  HPI  Mr Richman is a 64 y/o male with a history of HTN, COPD, tobacco use and chronic heart failure.   Echo report from 08/04/2019 reviewed and showed an EF of 25-30% along with mild/moderate TR.   Admitted 08/03/2019 due to acute on chronic HF. Cardiology consult obtained. Initially given IV lasix and then transitioned to oral diuretics. Right thoracentesis done with removal of 358ml. Oral medications adjusted for his HTN. Given solumedrol for COPD. Discharged after 7 days. Was in the ED 07/29/2019 due to mild HF exacerbation where he was treated and released.   He presents today for a follow-up visit with a chief complaint of minimal shortness of breath upon moderate exertion. He describes this as chronic in nature having been present for several years. He does have associated gradual weight gain along with this because he says that he's eating "too much". He denies any difficulty sleeping, dizziness, abdominal distention, palpitations, pedal edema, chest pain, cough or fatigue.   Says that his cardiologist sent in a RX for entresto 97/103mg  [facility] and it was going to cost him >$800 so he did not pick it up. He has been out of entresto for the last 4 days.   He says that he's receiving his 1st COVID vaccine tomorrow.    Past Medical History:  Diagnosis Date  . Chronic combined systolic (congestive) and diastolic (congestive) heart failure ([place])    a. 2019 reported neg stress test and nl cors on cath in [facility]; b. 07/2019 Echo: EF 25-30%, Gr2 DD, glob HK. Mod reduced RV fxn w/ volume overload. Mod dil RA. Mild to mod TR. Mod elev PASP.  Marland Kitchen COPD (chronic obstructive pulmonary disease) ([place])   . Diabetes mellitus without complication ([place])   . Hypertension   . Noncompliance   . Pleural effusion on right    a. 07/2019 Thoracentesis: 300 ml.    Social History   Tobacco Use  . Smoking status: Current Some Day  Smoker    Packs/day: 0.25    Years: 20.00    Pack years: 5.00  . Smokeless tobacco: Never Used  . Tobacco comment: a pack lasts a week (appx. 4 days)  Substance Use Topics  . Alcohol use: Never   No Known Allergies  Prior to Admission medications   Medication Sig Start Date End Date Taking? Authorizing Provider  acetaminophen (TYLENOL) 325 MG tablet Take 650 mg by mouth every 6 (six) hours as needed for mild pain or fever.    Yes [provider]  albuterol (VENTOLIN HFA) 108 (90 Base) MCG/ACT inhaler Inhale 2 puffs into the lungs every 6 (six) hours as needed for wheezing or shortness of breath. 09/08/19  Yes Hortencia Martire, Otila Kluver A, FNP  calcium carbonate (TUMS - DOSED IN MG ELEMENTAL CALCIUM) 500 MG chewable tablet Chew 1 tablet by mouth daily.   Yes [provider]  carvedilol (COREG) 25 MG tablet Take 1 tablet (25 mg total) by mouth 2 (two) times daily. 09/08/19  Yes Agapito Hanway A, FNP  fluticasone furoate-vilanterol (BREO ELLIPTA) 200-25 MCG/INH AEPB Inhale 1 puff into the lungs daily. 09/08/19  Yes Darylene Price A, FNP  furosemide (LASIX) 40 MG tablet Take 1 tablet (40 mg total) by mouth 2 (two) times daily. 09/08/19  Yes Tripp Goins, Otila Kluver A, FNP  gabapentin (NEURONTIN) 300 MG capsule Take 1 capsule (300 mg total) by mouth 2 (two)  times daily. 09/08/19  Yes Clarisa [name] A, FNP  spironolactone (ALDACTONE) 25 MG tablet Take 1 tablet (25 mg total) by mouth daily. 09/08/19  Yes Clarisa [name] A, FNP  tiotropium (SPIRIVA) 18 MCG inhalation capsule Place 1 capsule (18 mcg total) into inhaler and inhale daily. 09/08/19  Yes Siera Beyersdorf A, FNP  sacubitril-valsartan (ENTRESTO) 97-103 MG Take 1 tablet by mouth 2 (two) times daily. Patient not taking: Reported on 11/10/2019 10/15/19   Debbe Odea, MD      Review of Systems  Constitutional: Negative for appetite change and fatigue.  HENT: Negative for congestion, postnasal drip and sore throat.   Eyes: Negative.   Respiratory:  Positive for shortness of breath (improving). Negative for cough and chest tightness.   Cardiovascular: Negative for chest pain, palpitations and leg swelling.  Gastrointestinal: Negative for abdominal distention and abdominal pain.  Endocrine: Negative.   Genitourinary: Negative.   Musculoskeletal: Negative for back pain and neck pain.  Skin: Negative.   Allergic/Immunologic: Negative.   Neurological: Negative for dizziness and light-headedness.  Hematological: Negative for adenopathy. Does not bruise/bleed easily.  Psychiatric/Behavioral: Negative for dysphoric mood and sleep disturbance (sleeping on 1 pillows). The patient is not nervous/anxious.    Vitals:   11/10/19 1213  BP: (!) 179/106  Pulse: 68  Resp: 18  SpO2: 100%  Weight: 199 lb 6 oz (90.4 kg)  Height: 5\' 8"  (1.727 m)   Wt Readings from Last 3 Encounters:  11/10/19 199 lb 6 oz (90.4 kg)  10/15/19 194 lb 4 oz (88.1 kg)  09/23/19 182 lb 6.4 oz (82.7 kg)   Lab Results  Component Value Date   CREATININE 1.05 09/09/2019   CREATININE 0.95 08/09/2019   CREATININE 1.15 08/08/2019     Physical Exam Vitals and nursing note reviewed.  Constitutional:      Appearance: Normal appearance.  HENT:     Head: Normocephalic and atraumatic.  Neck:     Vascular: No carotid bruit.  Cardiovascular:     Rate and Rhythm: Normal rate and regular rhythm.  Pulmonary:     Effort: Pulmonary effort is normal. No respiratory distress.     Breath sounds: No wheezing or rales.  Abdominal:     General: Abdomen is flat. There is no distension.     Palpations: Abdomen is soft.  Musculoskeletal:        General: No tenderness.     Cervical back: Normal range of motion and neck supple.     Right lower leg: No tenderness. No edema.     Left lower leg: No tenderness. No edema.  Skin:    General: Skin is warm and dry.  Neurological:     General: No focal deficit present.     Mental Status: He is alert and oriented to person, place, and  time.  Psychiatric:        Mood and Affect: Mood normal.        Behavior: Behavior normal.    Assessment & Plan:  1: Chronic heart failure with reduced ejection fraction- - NYHA class II - euvolemic today - weighing daily and reinforced to call for an overnight weight gain of >2 pounds or a weekly weight gain of >5 pounds - weight up 17 pounds from last visit here 2 months ago; patient says that he's just eating "too much bread" - not adding salt and has been trying to read food labels; reviewed the importance of closely following a 2000mg  sodium diet  - saw cardiology (  [facility]) 10/15/19 & had entresto RX sent to pharmacy which he could not fill - 1 month samples given of entresto 49/51mg  so that he can resume taking that BID; also had patient sign novartis application and have sent RX to medication management clinic for 97/103mg  dose as well - check BMP at next visit - participating in paramedicine program - has been in frequent contact with social worker from [name] in [place]; encouraged him to call his social worker to get an update on his insurance status - BNP 08/03/2019 was 2085.0 - patient says that he's received his flu vaccine for this season  2: HTN- - BP elevated but he's been completely out of entresto for the last 4 days; resuming per above - does not have a PCP as he's uninsured & had transportation difficulty - BMP on 09/09/19 reviewed and showed sodium 143, potassium 3.7, creatinine 1.05 and GFR 88  3: COPD-  - using nebulizer and inhalers - smoking 1 cigarette ~ every month    Medication bottles were reviewed.   Return in 2 weeks or sooner for any questions/problems before then.

## 2019-11-10 ENCOUNTER — Other Ambulatory Visit: Payer: Self-pay

## 2019-11-10 ENCOUNTER — Ambulatory Visit: Payer: PRIVATE HEALTH INSURANCE | Attending: Family | Admitting: Family

## 2019-11-10 ENCOUNTER — Encounter: Payer: Self-pay | Admitting: Family

## 2019-11-10 VITALS — BP 179/106 | HR 68 | Resp 18 | Ht 68.0 in | Wt 199.4 lb

## 2019-11-10 DIAGNOSIS — I5042 Chronic combined systolic (congestive) and diastolic (congestive) heart failure: Secondary | ICD-10-CM | POA: Insufficient documentation

## 2019-11-10 DIAGNOSIS — F17210 Nicotine dependence, cigarettes, uncomplicated: Secondary | ICD-10-CM | POA: Insufficient documentation

## 2019-11-10 DIAGNOSIS — E119 Type 2 diabetes mellitus without complications: Secondary | ICD-10-CM | POA: Insufficient documentation

## 2019-11-10 DIAGNOSIS — I10 Essential (primary) hypertension: Secondary | ICD-10-CM

## 2019-11-10 DIAGNOSIS — Z7951 Long term (current) use of inhaled steroids: Secondary | ICD-10-CM | POA: Insufficient documentation

## 2019-11-10 DIAGNOSIS — I110 Hypertensive heart disease with heart failure: Secondary | ICD-10-CM | POA: Diagnosis not present

## 2019-11-10 DIAGNOSIS — Z79899 Other long term (current) drug therapy: Secondary | ICD-10-CM | POA: Diagnosis not present

## 2019-11-10 DIAGNOSIS — I5022 Chronic systolic (congestive) heart failure: Secondary | ICD-10-CM

## 2019-11-10 DIAGNOSIS — J449 Chronic obstructive pulmonary disease, unspecified: Secondary | ICD-10-CM | POA: Diagnosis not present

## 2019-11-10 NOTE — Patient Instructions (Signed)
Continue weighing daily and call for an overnight weight gain of > 2 pounds or a weekly weight gain of >5 pounds. 

## 2019-11-11 ENCOUNTER — Encounter: Payer: Self-pay | Admitting: Family

## 2019-11-11 ENCOUNTER — Ambulatory Visit: Payer: PRIVATE HEALTH INSURANCE | Attending: Internal Medicine

## 2019-11-11 DIAGNOSIS — Z23 Encounter for immunization: Secondary | ICD-10-CM

## 2019-11-11 MED ORDER — SACUBITRIL-VALSARTAN 97-103 MG PO TABS: 1.0000 | ORAL_TABLET | Freq: Two times a day (BID) | ORAL | 3 refills | Status: DC

## 2019-11-11 NOTE — Progress Notes (Signed)
   Covid-19 Vaccination Clinic  Name:  Kevin [name]    MRN: [id number] DOB: 1955-11-29  11/11/2019  Kevin [name] was observed post Covid-19 immunization for 15 minutes without incident. He was provided with Vaccine Information Sheet and instruction to access the V-Safe system.   Kevin [name] was instructed to call 911 with any severe reactions post vaccine: Marland Kitchen Difficulty breathing  . Swelling of face and throat  . A fast heartbeat  . A bad rash all over body  . Dizziness and weakness   Immunizations Administered    Name Date Dose VIS Date Route   Pfizer COVID-19 Vaccine 11/11/2019  3:53 PM 0.3 mL 08/06/2019 Intramuscular   Manufacturer: [employer], [employer]   Lot: [id number]   NDC: [address]-2

## 2019-11-15 ENCOUNTER — Telehealth (HOSPITAL_COMMUNITY): Payer: Self-pay

## 2019-11-15 NOTE — Telephone Encounter (Signed)
Spoke with Kevin [name] about paperwork Medication management is needing.  He states dont know where it is.  He states he has meds for about 2 more weeks.  Medication management says he has not picked up meds since January.  He said he had leftovers from [place], I had looked at those bottles and they were almost empty in January.  Not sure if he is taking his medications appropriately.  Will take him paperwork for medication management and help fill them out.  He is sup to bring the paperwork with him to [name] appt next week and then go to Medication management to turn in.  He has transportation problems.  He did states turned in paperwork for Medicaid to social services.   [name] [place] EMT-Paramedic [phone]

## 2019-11-17 ENCOUNTER — Telehealth: Payer: Self-pay | Admitting: Pharmacy Technician

## 2019-11-17 NOTE — Telephone Encounter (Signed)
Patient failed to provide 2021 poi.  No additional medication assistance will be provided by [facility] without the required proof of income documentation.  Patient notified by letter.  Balinda Heacock J. Kensie Susman Care Manager [facility]   [address] [place], [place]  [address]     This is to inform you that you are no longer eligible to receive medication assistance at [facility].  The reason(s) are:    _____Your total gross monthly household income exceeds 250% of the Federal Poverty Level.   _____Tangible assets (savings, checking, stocks/bonds, pension, retirement, etc.) exceeds our limit  _____You are eligible to receive benefits from Medicaid, [facility] or HIV Medication              Assistance Program _____You are eligible to receive benefits from a Medicare Part "D" plan _____You have prescription insurance  _____You are not an [place] resident __X__Failure to provide all requested proof of income information for 2021.    Medication assistance will resume once all requested financial information has been returned to our clinic.  If you have questions, please contact our clinic at [phone].    Thank you,  [facility] 

## 2019-11-18 ENCOUNTER — Encounter: Payer: Self-pay | Admitting: Family

## 2019-11-18 ENCOUNTER — Ambulatory Visit: Payer: PRIVATE HEALTH INSURANCE | Attending: Family | Admitting: Family

## 2019-11-18 ENCOUNTER — Other Ambulatory Visit: Payer: Self-pay

## 2019-11-18 VITALS — BP 180/100 | HR 72 | Resp 18 | Ht 68.0 in | Wt 203.0 lb

## 2019-11-18 DIAGNOSIS — I110 Hypertensive heart disease with heart failure: Secondary | ICD-10-CM | POA: Insufficient documentation

## 2019-11-18 DIAGNOSIS — E119 Type 2 diabetes mellitus without complications: Secondary | ICD-10-CM | POA: Diagnosis not present

## 2019-11-18 DIAGNOSIS — Z7951 Long term (current) use of inhaled steroids: Secondary | ICD-10-CM | POA: Diagnosis not present

## 2019-11-18 DIAGNOSIS — I5042 Chronic combined systolic (congestive) and diastolic (congestive) heart failure: Secondary | ICD-10-CM | POA: Insufficient documentation

## 2019-11-18 DIAGNOSIS — F17210 Nicotine dependence, cigarettes, uncomplicated: Secondary | ICD-10-CM | POA: Diagnosis not present

## 2019-11-18 DIAGNOSIS — J449 Chronic obstructive pulmonary disease, unspecified: Secondary | ICD-10-CM | POA: Insufficient documentation

## 2019-11-18 DIAGNOSIS — I5022 Chronic systolic (congestive) heart failure: Secondary | ICD-10-CM

## 2019-11-18 DIAGNOSIS — Z79899 Other long term (current) drug therapy: Secondary | ICD-10-CM | POA: Diagnosis not present

## 2019-11-18 DIAGNOSIS — I10 Essential (primary) hypertension: Secondary | ICD-10-CM | POA: Diagnosis present

## 2019-11-18 MED ORDER — FUROSEMIDE 40 MG PO TABS: 40.0000 mg | ORAL_TABLET | Freq: Two times a day (BID) | ORAL | 5 refills | Status: DC

## 2019-11-18 MED ORDER — CARVEDILOL 25 MG PO TABS: 25.0000 mg | ORAL_TABLET | Freq: Two times a day (BID) | ORAL | 3 refills | Status: DC

## 2019-11-18 MED ORDER — SPIRONOLACTONE 25 MG PO TABS: 25.0000 mg | ORAL_TABLET | Freq: Every day | ORAL | 5 refills | Status: DC

## 2019-11-18 NOTE — Progress Notes (Signed)
Patient ID: Kevin [name], male    DOB: [birth year], 64 y.o.   MRN: [id number]  HPI  Kevin [name] is a 64 y/o male with a history of HTN, COPD, tobacco use and chronic heart failure.   Echo report from 08/04/2019 reviewed and showed an EF of 25-30% along with mild/moderate TR.   Admitted 08/03/2019 due to acute on chronic HF. Cardiology consult obtained. Initially given IV lasix and then transitioned to oral diuretics. Right thoracentesis done with removal of . Oral medications adjusted for his HTN. Given solumedrol for COPD. Discharged after 7 days. Was in the ED 07/29/2019 due to mild HF exacerbation where he was treated and released.   He presents today for a follow-up visit although showed up a week early. He has no complaints and specifically denies any difficulty sleeping, dizziness, abdominal distention, palpitations, pedal edema, chest pain, shortness of breath, cough or fatigue.   He does notice a continued weight gain because he says that he just "loves croissants" and will eat 2 in the morning almost every morning.    Past Medical History:  Diagnosis Date  . Chronic combined systolic (congestive) and diastolic (congestive) heart failure (HCC)    a. 2019 reported neg stress test and nl cors on cath in [facility]; b. 07/2019 Echo: EF 25-30%, Gr2 DD, glob HK. Mod reduced RV fxn w/ volume overload. Mod dil RA. Mild to mod TR. Mod elev PASP.  Marland Kitchen COPD (chronic obstructive pulmonary disease) (HCC)   . Diabetes mellitus without complication (HCC)   . Hypertension   . Noncompliance   . Pleural effusion on right    a. 07/2019 Thoracentesis: 300 ml.    Social History   Tobacco Use  . Smoking status: Current Some Day Smoker    Packs/day: 0.25    Years: 20.00    Pack years: 5.00  . Smokeless tobacco: Never Used  . Tobacco comment: a pack lasts a week (appx. 4 days)  Substance Use Topics  . Alcohol use: Never   No Known Allergies  Prior to Admission medications   Medication Sig Start  Date End Date Taking? Authorizing Provider  acetaminophen (TYLENOL) 325 MG tablet Take 650 mg by mouth every 6 (six) hours as needed for mild pain or fever.    Yes [provider]  albuterol (VENTOLIN HFA) 108 (90 Base) MCG/ACT inhaler Inhale 2 puffs into the lungs every 6 (six) hours as needed for wheezing or shortness of breath. 09/08/19  Yes Clarisa [name] A, FNP  carvedilol (COREG) 25 MG tablet Take 1 tablet (25 mg total) by mouth 2 (two) times daily. 11/18/19  Yes Abisola Carrero A, FNP  fluticasone furoate-vilanterol (BREO ELLIPTA) 200-25 MCG/INH AEPB Inhale 1 puff into the lungs daily. 09/08/19  Yes Clarisa [name] A, FNP  furosemide (LASIX) 40 MG tablet Take 1 tablet (40 mg total) by mouth 2 (two) times daily. 11/18/19  Yes Lisl Slingerland A, FNP  sacubitril-valsartan (ENTRESTO) 49-51 MG Take 1 tablet by mouth 2 (two) times daily.   Yes [provider]  spironolactone (ALDACTONE) 25 MG tablet Take 1 tablet (25 mg total) by mouth daily. 11/18/19  Yes Clarisa [name] A, FNP  tiotropium (SPIRIVA) 18 MCG inhalation capsule Place 1 capsule (18 mcg total) into inhaler and inhale daily. 09/08/19  Yes Xavian Hardcastle, Inetta Fermo A, FNP  calcium carbonate (TUMS - DOSED IN MG ELEMENTAL CALCIUM) 500 MG chewable tablet Chew 1 tablet by mouth daily.    [provider]  gabapentin (NEURONTIN) 300 MG  capsule Take 1 capsule (300 mg total) by mouth 2 (two) times daily. Patient not taking: Reported on 11/18/2019 09/08/19   Delma Freeze, FNP     Review of Systems  Constitutional: Negative for appetite change and fatigue.  HENT: Negative for congestion, postnasal drip and sore throat.   Eyes: Negative.   Respiratory: Negative for cough, chest tightness and shortness of breath.   Cardiovascular: Negative for chest pain, palpitations and leg swelling.  Gastrointestinal: Negative for abdominal distention and abdominal pain.  Endocrine: Negative.   Genitourinary: Negative.   Musculoskeletal: Negative for back  pain and neck pain.  Skin: Negative.   Allergic/Immunologic: Negative.   Neurological: Negative for dizziness and light-headedness.  Hematological: Negative for adenopathy. Does not bruise/bleed easily.  Psychiatric/Behavioral: Negative for dysphoric mood and sleep disturbance (sleeping on 1 pillows). The patient is not nervous/anxious.     Vitals:   11/18/19 1249 11/18/19 1407  BP: (!) 183/115 (!) 180/100  Pulse: 72   Resp: 18   SpO2: 100%   Weight: 203 lb (92.1 kg)   Height: 5\' 8"  (1.727 m)    Wt Readings from Last 3 Encounters:  11/18/19 203 lb (92.1 kg)  11/10/19 199 lb 6 oz (90.4 kg)  10/15/19 194 lb 4 oz (88.1 kg)   Lab Results  Component Value Date   CREATININE 1.05 09/09/2019   CREATININE 0.95 08/09/2019   CREATININE 1.15 08/08/2019     Physical Exam Vitals and nursing note reviewed.  Constitutional:      Appearance: Normal appearance.  HENT:     Head: Normocephalic and atraumatic.  Neck:     Vascular: No carotid bruit.  Cardiovascular:     Rate and Rhythm: Normal rate and regular rhythm.  Pulmonary:     Effort: Pulmonary effort is normal. No respiratory distress.     Breath sounds: No wheezing or rales.  Abdominal:     General: Abdomen is flat. There is no distension.     Palpations: Abdomen is soft.  Musculoskeletal:        General: No tenderness.     Cervical back: Normal range of motion and neck supple.     Right lower leg: No tenderness. No edema.     Left lower leg: No tenderness. No edema.  Skin:    General: Skin is warm and dry.  Neurological:     General: No focal deficit present.     Mental Status: He is alert and oriented to person, place, and time.  Psychiatric:        Mood and Affect: Mood normal.        Behavior: Behavior normal.    Assessment & Plan:  1: Chronic heart failure with reduced ejection fraction- - NYHA class II - euvolemic today - weighing daily and reinforced to call for an overnight weight gain of >2 pounds or a  weekly weight gain of >5 pounds - weight up 4 pounds from last visit here 8 days ago; encouraged him to decrease his consumption of croissants - not adding salt and has been trying to read food labels; reviewed the importance of closely following a 2000mg  sodium diet  - saw cardiology ([facility]) 10/15/19 - patient has only been back on entresto for 1 week so will not check BMP yet (patient showed up to clinic 1 week early) - participating in paramedicine program - BNP 08/03/2019 was 2085.0 - patient says that he's received his first COVID vaccine  2: HTN- - BP elevated initially (183/115)  as he says that he only took his entresto this morning; took his other medications while in the office and BP was starting to decline after recheck - reminded him to make sure he takes the 2nd doses of his medications later today - does not have a PCP as he's uninsured & had transportation difficulty - BMP on 09/09/19 reviewed and showed sodium 143, potassium 3.7, creatinine 1.05 and GFR 88  3: COPD-  - using nebulizer and inhalers - smoking 1 cigarette ~ every month    Medication bottles were reviewed.    Return in 1 month or sooner for any questions/problems before then.

## 2019-11-18 NOTE — Patient Instructions (Signed)
Continue weighing daily and call for an overnight weight gain of > 2 pounds or a weekly weight gain of >5 pounds. 

## 2019-11-22 ENCOUNTER — Other Ambulatory Visit (HOSPITAL_COMMUNITY): Payer: Self-pay

## 2019-11-23 NOTE — Progress Notes (Signed)
Today took paperwork for Medication Management and helped Evian fill it out.  Will turn it in to Medication Management for him and find out how soon he can get his medications refilled.  He is out of some of his medications right now.  He states feeling ok.  Will continue to visit for heart failure.   [name] Kraemer EMT-Paramedic [phone]

## 2019-11-24 ENCOUNTER — Ambulatory Visit: Payer: PRIVATE HEALTH INSURANCE | Admitting: Family

## 2019-11-29 ENCOUNTER — Ambulatory Visit: Payer: PRIVATE HEALTH INSURANCE | Admitting: Pharmacy Technician

## 2019-11-29 ENCOUNTER — Telehealth: Payer: Self-pay | Admitting: Family

## 2019-11-29 DIAGNOSIS — Z79899 Other long term (current) drug therapy: Secondary | ICD-10-CM

## 2019-11-29 NOTE — Progress Notes (Signed)
Completed [facility] application and contract.  Patient agreed to all terms of the [facility] contract.    Patient approved to receive medication assistance at [facility] until time for re-certification in 9794, and as long as eligibility criteria continues to be met.    Provided patient with Civil engineer, contracting based on his particular needs.    Patient has Medicaid in [place].  Does not work in [place].  Ellie Lunch okayed providing medication assistance to this patient.  [place] [facility]

## 2019-12-01 ENCOUNTER — Telehealth: Payer: Self-pay | Admitting: Gerontology

## 2019-12-01 ENCOUNTER — Other Ambulatory Visit: Payer: Self-pay

## 2019-12-01 NOTE — Telephone Encounter (Signed)
LVM 4/7

## 2019-12-02 ENCOUNTER — Telehealth: Payer: Self-pay | Admitting: Gerontology

## 2019-12-02 NOTE — Telephone Encounter (Signed)
I was able to speak with Kevin [name] and get his first appt scheduled.

## 2019-12-07 ENCOUNTER — Other Ambulatory Visit: Payer: Self-pay

## 2019-12-07 ENCOUNTER — Encounter: Payer: Self-pay | Admitting: Gerontology

## 2019-12-07 ENCOUNTER — Ambulatory Visit: Payer: Medicaid Other | Admitting: Gerontology

## 2019-12-07 VITALS — BP 163/95 | HR 60 | Ht 68.0 in | Wt 198.0 lb

## 2019-12-07 DIAGNOSIS — E1159 Type 2 diabetes mellitus with other circulatory complications: Secondary | ICD-10-CM | POA: Insufficient documentation

## 2019-12-07 DIAGNOSIS — Z8709 Personal history of other diseases of the respiratory system: Secondary | ICD-10-CM | POA: Insufficient documentation

## 2019-12-07 DIAGNOSIS — I509 Heart failure, unspecified: Secondary | ICD-10-CM

## 2019-12-07 DIAGNOSIS — I10 Essential (primary) hypertension: Secondary | ICD-10-CM

## 2019-12-07 DIAGNOSIS — Z7689 Persons encountering health services in other specified circumstances: Secondary | ICD-10-CM | POA: Insufficient documentation

## 2019-12-07 NOTE — Patient Instructions (Signed)
Managing Your Hypertension Hypertension is commonly called high blood pressure. This is when the force of your blood pressing against the walls of your arteries is too strong. Arteries are blood vessels that carry blood from your heart throughout your body. Hypertension forces the heart to work harder to pump blood, and may cause the arteries to become narrow or stiff. Having untreated or uncontrolled hypertension can cause heart attack, stroke, kidney disease, and other problems. What are blood pressure readings? A blood pressure reading consists of a higher number over a lower number. Ideally, your blood pressure should be below 120/80. The first ("top") number is called the systolic pressure. It is a measure of the pressure in your arteries as your heart beats. The second ("bottom") number is called the diastolic pressure. It is a measure of the pressure in your arteries as the heart relaxes. What does my blood pressure reading mean? Blood pressure is classified into four stages. Based on your blood pressure reading, your health care provider may use the following stages to determine what type of treatment you need, if any. Systolic pressure and diastolic pressure are measured in a unit called mm Hg. Normal  Systolic pressure: below 784.  Diastolic pressure: below 80. Elevated  Systolic pressure: 696-295.  Diastolic pressure: below 80. Hypertension stage 1  Systolic pressure: 284-132.  Diastolic pressure: 44-01. Hypertension stage 2  Systolic pressure: 027 or above.  Diastolic pressure: 90 or above. What health risks are associated with hypertension? Managing your hypertension is an important responsibility. Uncontrolled hypertension can lead to:  A heart attack.  A stroke.  A weakened blood vessel (aneurysm).  Heart failure.  Kidney damage.  Eye damage.  Metabolic syndrome.  Memory and concentration problems. What changes can I make to manage my hypertension?  Hypertension can be managed by making lifestyle changes and possibly by taking medicines. Your health care provider will help you make a plan to bring your blood pressure within a normal range. Eating and drinking   Eat a diet that is high in fiber and potassium, and low in salt (sodium), added sugar, and fat. An example eating plan is called the DASH (Dietary Approaches to Stop Hypertension) diet. To eat this way: ? Eat plenty of fresh fruits and vegetables. Try to fill half of your plate at each meal with fruits and vegetables. ? Eat whole grains, such as whole wheat pasta, brown rice, or whole grain bread. Fill about one quarter of your plate with whole grains. ? Eat low-fat diary products. ? Avoid fatty cuts of meat, processed or cured meats, and poultry with skin. Fill about one quarter of your plate with lean proteins such as fish, chicken without skin, beans, eggs, and tofu. ? Avoid premade and processed foods. These tend to be higher in sodium, added sugar, and fat.  Reduce your daily sodium intake. Most people with hypertension should eat less than 1,500 mg of sodium a day.  Limit alcohol intake to no more than 1 drink a day for nonpregnant women and 2 drinks a day for men. One drink equals 12 oz of beer, 5 oz of wine, or 1 oz of hard liquor. Lifestyle  Work with your health care provider to maintain a healthy body weight, or to lose weight. Ask what an ideal weight is for you.  Get at least 30 minutes of exercise that causes your heart to beat faster (aerobic exercise) most days of the week. Activities may include walking, swimming, or biking.  Include exercise  to strengthen your muscles (resistance exercise), such as weight lifting, as part of your weekly exercise routine. Try to do these types of exercises for 30 minutes at least 3 days a week.  Do not use any products that contain nicotine or tobacco, such as cigarettes and e-cigarettes. If you need help quitting, ask your health  care provider.  Control any long-term (chronic) conditions you have, such as high cholesterol or diabetes. Monitoring  Monitor your blood pressure at home as told by your health care provider. Your personal target blood pressure may vary depending on your medical conditions, your age, and other factors.  Have your blood pressure checked regularly, as often as told by your health care provider. Working with your health care provider  Review all the medicines you take with your health care provider because there may be side effects or interactions.  Talk with your health care provider about your diet, exercise habits, and other lifestyle factors that may be contributing to hypertension.  Visit your health care provider regularly. Your health care provider can help you create and adjust your plan for managing hypertension. Will I need medicine to control my blood pressure? Your health care provider may prescribe medicine if lifestyle changes are not enough to get your blood pressure under control, and if:  Your systolic blood pressure is 130 or higher.  Your diastolic blood pressure is 80 or higher. Take medicines only as told by your health care provider. Follow the directions carefully. Blood pressure medicines must be taken as prescribed. The medicine does not work as well when you skip doses. Skipping doses also puts you at risk for problems. Contact a health care provider if:  You think you are having a reaction to medicines you have taken.  You have repeated (recurrent) headaches.  You feel dizzy.  You have swelling in your ankles.  You have trouble with your vision. Get help right away if:  You develop a severe headache or confusion.  You have unusual weakness or numbness, or you feel faint.  You have severe pain in your chest or abdomen.  You vomit repeatedly.  You have trouble breathing. Summary  Hypertension is when the force of blood pumping through your arteries  is too strong. If this condition is not controlled, it may put you at risk for serious complications.  Your personal target blood pressure may vary depending on your medical conditions, your age, and other factors. For most people, a normal blood pressure is less than 120/80.  Hypertension is managed by lifestyle changes, medicines, or both. Lifestyle changes include weight loss, eating a healthy, low-sodium diet, exercising more, and limiting alcohol. This information is not intended to replace advice given to you by your health care provider. Make sure you discuss any questions you have with your health care provider. Document Revised: 12/04/2018 Document Reviewed: 07/10/2016 Elsevier Patient Education  2020 [place].  Preventing Heart Failure Heart failure is a condition in which the heart has trouble pumping blood because it has become weak or stiff. This may mean that the heart cannot pump enough blood out to the body or that the heart does not fill up with enough blood. Either of those problems can lead to symptoms such as tiredness (fatigue), trouble breathing, and swelling throughout the body. This is a common medical condition that affects not only the heart, but the entire body. Making certain nutrition and lifestyle changes can help you prevent heart failure and avoid serious health problems. How can  this condition affect me? Heart failure can cause very serious problems that may get worse over time, such as:  Extreme fatigue during normal physical activities.  Shortness of breath or trouble breathing.  Swelling in your abdomen, legs, ankles, feet, or neck.  Fluid buildup throughout the body.  Weight gain.  Cough.  Frequent urination. What can increase my risk? The risk of heart failure increases as a person ages. The following factors may also make you more likely to develop this condition:  Being overweight.  Being male.  Smoking or chewing tobacco.  Abusing  alcohol or drugs.  Having taken medicines that can damage the heart, such as chemotherapy drugs.  Having any of these medical conditions: ? Diabetes. ? Abnormal heart rhythms. ? Thyroid problems. ? Low blood counts (anemia). What actions can I take to prevent heart failure?     Nutrition  If you are overweight or obese, reduce how many calories you eat each day so that you lose weight. Work with your health care provider or a diet and nutrition specialist (dietitian) to determine how many calories you need each day.  Eat foods that are low in salt (sodium). Avoid adding extra salt to foods. This can help keep your blood pressure in a normal range.  Eat a well-balanced diet that includes a lot of: ? Fresh fruits and vegetables. ? Whole grains. ? Lean meats. ? Beans. ? Fat-free or low-fat dairy products.  Avoid foods that contain a lot of: ? Trans fats. ? Saturated fats. ? Sugar. ? Cholesterol. Alcohol  Do not drink alcohol if: ? Your health care provider tells you not to drink. ? You are pregnant, may be pregnant, or are planning to become pregnant.  If you drink alcohol: ? Limit how much you use to:  0-1 drink a day for women.  0-2 drinks a day for men. ? Be aware of how much alcohol is in your drink. In the [place] one drink equals one 12 oz bottle of beer (355 mL), one 5 oz glass of wine (148 mL), or one 1 oz glass of hard liquor (44 mL). Lifestyle  Do not use any products that contain nicotine or tobacco, such as cigarettes, e-cigarettes, and chewing tobacco. If you need help quitting, ask your health care provider.  Exercise for at least 150 minutes each week, or as much as told by your health care provider. ? Do moderate-intensity exercise, such as brisk walking, bicycling, or water aerobics. ? Ask your health care provider which activities are safe for you.  Try to get 7-9 hours of sleep each night. To help with sleep: ? Keep your bedroom cool and dark. ? Do  not eat a heavy meal during the hour before you go to bed. ? Do not drink alcohol or caffeinated drinks before bed. ? Avoid screen time before bedtime. This means avoiding television, computers, tablets, and mobile phones.  Find ways to relax and manage stress. These may include: ? Breathing exercises. ? Meditation. ? Yoga. ? Listening to music. General instructions  See a health care provider regularly for screening and wellness checks. Work with your health care provider to manage your: ? Blood pressure. ? Cholesterol levels. ? Blood sugar (glucose) levels. ? Weight and BMI.  If you have diabetes, manage your condition and follow your treatment plan as instructed. Where to find more information  National Heart, Lung, and Blood Institute: PopSteam.is  Centers for Disease Control and Prevention: FootballExhibition.com.br  [employer] on Aging: https://walker.com/  American Heart Association: www.heart.org Contact a health care provider if:  You have rapid weight gain.  You have increasing shortness of breath that is unusual for you.  You tire easily or you are unable to participate in your usual activities.  You cough more than normal, especially with physical activity.  You have any swelling or more swelling in areas such as your hands, feet, ankles, or abdomen. Get help right away if you have:  Trouble breathing.  Pain or discomfort in your chest.  An episode of fainting. These symptoms may represent a serious problem that is an emergency. Do not wait to see if the symptoms will go away. Get medical help right away. Call your local emergency services (911 in the [place] Do not drive yourself to the hospital. Summary  Heart failure can cause very serious problems over time.  Heart failure can be prevented by making changes to your diet and your lifestyle.  It is important to eat a healthy diet, manage your weight, exercise regularly, manage stress, avoid drugs and  alcohol, and keep your cholesterol and blood pressure under control. This information is not intended to replace advice given to you by your health care provider. Make sure you discuss any questions you have with your health care provider. Document Revised: 03/12/2019 Document Reviewed: 03/12/2019 Elsevier Patient Education  2020 [employer].

## 2019-12-07 NOTE — Progress Notes (Signed)
Patient ID: Kevin [name], male   DOB: [birth year], 64 y.o.   MRN: [id number]  Chief Complaint  Patient presents with  . Establish Care    HPI Kevin [name] is a 64 y.o. male who presents to establish care and evaluation of his chronic health conditions. He states that he was not aware that he has T2DM, his HgbA1c done on 08/07/2019 was 7.2%. He also has a history of COPD and he uses albuterol inhaler as needed, Breo- Ellipta daily and Spiriva 18 mcg daily. He states that his breathing is stable and has not had exacerbation in many months, but he smokes 1 pack of cigarette in 2 weeks and admits the desire to quit. He has a history of Congestive Heart Failure and he was seen at the CHF clinic on 11/18/2019 by  Ms. Harlow Ohms FNP and he states that he weighs himself daily. He has a history of hypertension, and his blood pressure was 163/95 during visit. He also was seen by the Cardiologist Dr Azucena Cecil, B on 10/15/2019. His Entresto was increased to 97/103 mg, but he continues to take Entresto 49-51 mg. He states that he has not picked up Entresto 97/103 mg because of transportation issues. He states that he's compliant with his medications, working on making healthy life style modifications. Overall, he states that he's doing well and offers no further complaint.      Past Medical History:  Diagnosis Date  . Chronic combined systolic (congestive) and diastolic (congestive) heart failure (HCC)    a. 2019 reported neg stress test and nl cors on cath [facility]; b. 07/2019 Echo: EF 25-30%, Gr2 DD, glob HK. Mod reduced RV fxn w/ volume overload. Mod dil RA. Mild to mod TR. Mod elev PASP.  Marland Kitchen COPD (chronic obstructive pulmonary disease) (HCC)   . Diabetes mellitus without complication (HCC)   . Hypertension   . Noncompliance   . Pleural effusion on right    a. 07/2019 Thoracentesis: 300 ml.    No past surgical history on file.  No family history on file.  Social History Social History   Tobacco Use   . Smoking status: Current Some Day Smoker    Packs/day: 0.25    Years: 20.00    Pack years: 5.00  . Smokeless tobacco: Never Used  . Tobacco comment: a pack lasts a week (appx. 4 days)  Substance Use Topics  . Alcohol use: Never  . Drug use: Not Currently    No Known Allergies  Current Outpatient Medications  Medication Sig Dispense Refill  . albuterol (VENTOLIN HFA) 108 (90 Base) MCG/ACT inhaler Inhale 2 puffs into the lungs every 6 (six) hours as needed for wheezing or shortness of breath. 18 g 3  . carvedilol (COREG) 25 MG tablet Take 1 tablet (25 mg total) by mouth 2 (two) times daily. 60 tablet 3  . fluticasone furoate-vilanterol (BREO ELLIPTA) 200-25 MCG/INH AEPB Inhale 1 puff into the lungs daily. 60 each 3  . furosemide (LASIX) 40 MG tablet Take 1 tablet (40 mg total) by mouth 2 (two) times daily. 60 tablet 5  . sacubitril-valsartan (ENTRESTO) 49-51 MG Take 1 tablet by mouth 2 (two) times daily.    Marland Kitchen spironolactone (ALDACTONE) 25 MG tablet Take 1 tablet (25 mg total) by mouth daily. 30 tablet 5  . tiotropium (SPIRIVA) 18 MCG inhalation capsule Place 1 capsule (18 mcg total) into inhaler and inhale daily. 30 capsule 3  . acetaminophen (TYLENOL) 325 MG tablet Take 650 mg by  mouth every 6 (six) hours as needed for mild pain or fever.     . calcium carbonate (TUMS - DOSED IN MG ELEMENTAL CALCIUM) 500 MG chewable tablet Chew 1 tablet by mouth daily.    Marland Kitchen gabapentin (NEURONTIN) 300 MG capsule Take 1 capsule (300 mg total) by mouth 2 (two) times daily. (Patient not taking: Reported on 11/18/2019) 60 capsule 5  . sacubitril-valsartan (ENTRESTO) 97-103 MG Take 1 tablet by mouth 2 (two) times daily. (Patient not taking: Reported on 12/07/2019) 180 tablet 3   No current facility-administered medications for this visit.    Review of Systems Review of Systems  Constitutional: Negative.   HENT: Negative.   Eyes: Negative.   Respiratory: Negative.   Cardiovascular: Negative.    Gastrointestinal: Negative.   Endocrine: Negative.   Genitourinary: Negative.   Musculoskeletal: Negative.   Skin: Negative.   Neurological: Negative.   Hematological: Negative.   Psychiatric/Behavioral: Negative.     Blood pressure (!) 163/95, pulse 60, height 5\' 8"  (1.727 m), weight 198 lb (89.8 kg), SpO2 98 %.  Physical Exam Physical Exam HENT:     Head: Normocephalic and atraumatic.     Nose:     Comments: Deferred per Covid protocol    Mouth/Throat:     Comments: Deferred per Covid protocol Eyes:     Extraocular Movements: Extraocular movements intact.     Pupils: Pupils are equal, round, and reactive to light.  Cardiovascular:     Rate and Rhythm: Normal rate and regular rhythm.     Pulses: Normal pulses.     Heart sounds: Normal heart sounds.  Pulmonary:     Effort: Pulmonary effort is normal.     Breath sounds: Normal breath sounds.  Abdominal:     General: Bowel sounds are normal.     Palpations: Abdomen is soft.  Genitourinary:    Comments: Deferred per Covid protocol Musculoskeletal:        General: Normal range of motion.     Cervical back: Normal range of motion.     Comments: Ambulates with Merry walker  Skin:    General: Skin is warm and dry.  Neurological:     General: No focal deficit present.     Mental Status: He is alert and oriented to person, place, and time. Mental status is at baseline.  Psychiatric:        Mood and Affect: Mood normal.        Behavior: Behavior normal.        Thought Content: Thought content normal.        Judgment: Judgment normal.     Data Reviewed - Lab and past medical history was reviewed.  Assessment and Plan  1. Acute on chronic congestive heart failure, unspecified heart failure type (HCC) - He was provided with bus pass and advised to pick up Entresto 97-103 mg [facility]from [facility]. He will continue to follow up with Ms. FNP on 12/16/2019 and Cardiologist Dr 12/18/2019 on  01/14/2020.  2. Essential hypertension - He blood pressure is not controlled, he will continue on current treatment regimen and advised to pick up and start Entresto 97-103 mg bid. He was advised to continue on DASH diet, check blood pressure daily, record and bring log to follow up appointment.  3. Encounter to establish care - Routine labs will be checked. - HgB A1c  - Lipid panel; Future - Urinalysis; Future - Urine Microalbumin w/creat. ratio  4. History of COPD -  His COPD is under control and he will continue on current medication. He was advised strongly on smoking cessation and was provided with NCQuit line information.    Follow up: 12/23/2019 or if symptoms fail to improve or worsens.  Kevin [name] 12/07/2019, 3:01 PM

## 2019-12-08 ENCOUNTER — Ambulatory Visit: Payer: PRIVATE HEALTH INSURANCE | Attending: Internal Medicine

## 2019-12-08 DIAGNOSIS — Z23 Encounter for immunization: Secondary | ICD-10-CM

## 2019-12-08 NOTE — Progress Notes (Signed)
   Covid-19 Vaccination Clinic  Name:  Clayburn Weekly    MRN: [id number] DOB: February 12, 1956  12/08/2019  Mr. Maltese was observed post Covid-19 immunization for 15 minutes without incident. He was provided with Vaccine Information Sheet and instruction to access the V-Safe system.   Mr. Sevin was instructed to call 911 with any severe reactions post vaccine: Marland Kitchen Difficulty breathing  . Swelling of face and throat  . A fast heartbeat  . A bad rash all over body  . Dizziness and weakness   Immunizations Administered    Name Date Dose VIS Date Route   Pfizer COVID-19 Vaccine 12/08/2019  3:45 PM 0.3 mL 08/06/2019 Intramuscular   Manufacturer: [employer], [employer]   Lot: [id number]   NDC: [address]-6

## 2019-12-14 ENCOUNTER — Telehealth (HOSPITAL_COMMUNITY): Payer: Self-pay

## 2019-12-14 NOTE — Telephone Encounter (Signed)
Today had a telephone visit with Kevin [name].  He states doing well.  He has not picked up his medications from [facility] yet, they have approved him.  He has [name] appt this week and reminded of it.  Advised him [facility] is scheduled to pick him up.  He has appt tomorrow for labs and he is aware.  He states picking up his medications tomorrow after his appt. He states he is not out of meds yet.  He states weight is 198 lbs, he is weighing everyday.  He states tries to watch high sodium foods.  He denies any problems today.  Will continue to visit for heart failure and medication management.   [name] [place] EMT-Paramedic [phone]

## 2019-12-15 ENCOUNTER — Other Ambulatory Visit: Payer: Medicaid Other

## 2019-12-15 ENCOUNTER — Other Ambulatory Visit: Payer: Self-pay

## 2019-12-15 DIAGNOSIS — Z7689 Persons encountering health services in other specified circumstances: Secondary | ICD-10-CM

## 2019-12-16 ENCOUNTER — Telehealth: Payer: Self-pay | Admitting: Family

## 2019-12-16 ENCOUNTER — Ambulatory Visit: Payer: PRIVATE HEALTH INSURANCE | Admitting: Family

## 2019-12-16 ENCOUNTER — Telehealth (HOSPITAL_COMMUNITY): Payer: Self-pay

## 2019-12-16 LAB — URINALYSIS
Bilirubin, UA: NEGATIVE
Glucose, UA: NEGATIVE
Leukocytes,UA: NEGATIVE
Nitrite, UA: NEGATIVE
RBC, UA: NEGATIVE
Specific Gravity, UA: 1.018 (ref 1.005–1.030)
Urobilinogen, Ur: 1 mg/dL (ref 0.2–1.0)
pH, UA: 5 (ref 5.0–7.5)

## 2019-12-16 LAB — LIPID PANEL
Chol/HDL Ratio: 5.1 ratio — ABNORMAL HIGH (ref 0.0–5.0)
Cholesterol, Total: 213 mg/dL — ABNORMAL HIGH (ref 100–199)
HDL: 42 mg/dL (ref >39)
LDL Chol Calc (NIH): 158 mg/dL — ABNORMAL HIGH (ref 0–99)
Triglycerides: 72 mg/dL (ref 0–149)
VLDL Cholesterol Cal: 13 mg/dL (ref 5–40)

## 2019-12-16 LAB — HEMOGLOBIN A1C
Est. average glucose Bld gHb Est-mCnc: 123 mg/dL
Hgb A1c MFr Bld: 5.9 % — ABNORMAL HIGH (ref 4.8–5.6)

## 2019-12-16 NOTE — Telephone Encounter (Signed)
Patient did not show for his Heart Failure Clinic appointment on 12/16/19. Will attempt to reschedule.

## 2019-12-16 NOTE — Progress Notes (Deleted)
Patient ID: Kevin [name], male    DOB: 1955-10-05, 64 y.o.   MRN: [id number]  HPI  Kevin [name] is a 65 y/o male with a history of HTN, COPD, tobacco use and chronic heart failure.   Echo report from 08/04/2019 reviewed and showed an EF of 25-30% along with mild/moderate TR.   Admitted 08/03/2019 due to acute on chronic HF. Cardiology consult obtained. Initially given IV lasix and then transitioned to oral diuretics. Right thoracentesis done with removal of 365ml. Oral medications adjusted for his HTN. Given solumedrol for COPD. Discharged after 7 days. Was in the ED 07/29/2019 due to mild HF exacerbation where he was treated and released.   He presents today for a follow-up visit with a chief complaint of    Past Medical History:  Diagnosis Date  . Chronic combined systolic (congestive) and diastolic (congestive) heart failure ([place])    a. 2019 reported neg stress test and nl cors on cath in [facility]; b. 07/2019 Echo: EF 25-30%, Gr2 DD, glob HK. Mod reduced RV fxn w/ volume overload. Mod dil RA. Mild to mod TR. Mod elev PASP.  Marland Kitchen COPD (chronic obstructive pulmonary disease) ([place])   . Diabetes mellitus without complication ([place])   . Hypertension   . Noncompliance   . Pleural effusion on right    a. 07/2019 Thoracentesis: 300 ml.    Social History   Tobacco Use  . Smoking status: Current Some Day Smoker    Packs/day: 0.25    Years: 20.00    Pack years: 5.00  . Smokeless tobacco: Never Used  . Tobacco comment: a pack lasts a week (appx. 4 days)  Substance Use Topics  . Alcohol use: Never   No Known Allergies     Review of Systems  Constitutional: Negative for appetite change and fatigue.  HENT: Negative for congestion, postnasal drip and sore throat.   Eyes: Negative.   Respiratory: Negative for cough, chest tightness and shortness of breath.   Cardiovascular: Negative for chest pain, palpitations and leg swelling.  Gastrointestinal: Negative for abdominal distention and  abdominal pain.  Endocrine: Negative.   Genitourinary: Negative.   Musculoskeletal: Negative for back pain and neck pain.  Skin: Negative.   Allergic/Immunologic: Negative.   Neurological: Negative for dizziness and light-headedness.  Hematological: Negative for adenopathy. Does not bruise/bleed easily.  Psychiatric/Behavioral: Negative for dysphoric mood and sleep disturbance (sleeping on 1 pillows). The patient is not nervous/anxious.        Physical Exam Vitals and nursing note reviewed.  Constitutional:      Appearance: Normal appearance.  HENT:     Head: Normocephalic and atraumatic.  Neck:     Vascular: No carotid bruit.  Cardiovascular:     Rate and Rhythm: Normal rate and regular rhythm.  Pulmonary:     Effort: Pulmonary effort is normal. No respiratory distress.     Breath sounds: No wheezing or rales.  Abdominal:     General: Abdomen is flat. There is no distension.     Palpations: Abdomen is soft.  Musculoskeletal:        General: No tenderness.     Cervical back: Normal range of motion and neck supple.     Right lower leg: No tenderness. No edema.     Left lower leg: No tenderness. No edema.  Skin:    General: Skin is warm and dry.  Neurological:     General: No focal deficit present.     Mental Status: He is  alert and oriented to person, place, and time.  Psychiatric:        Mood and Affect: Mood normal.        Behavior: Behavior normal.    Assessment & Plan:  1: Chronic heart failure with reduced ejection fraction- - NYHA class II - euvolemic today - weighing daily and reinforced to call for an overnight weight gain of >2 pounds or a weekly weight gain of >5 pounds - weight 203 pounds from last visit here 1 month ago - not adding salt and has been trying to read food labels; reviewed the importance of closely following a 2000mg  sodium diet  - will check BMP today - saw cardiology ([place]) 10/15/19 - participating in paramedicine program - BNP  08/03/2019 was 2085.0 - patient says that he's received his first COVID vaccine  2: HTN- - BP  - saw PCP at [facility] 12/07/19 - BMP on 09/09/19 reviewed and showed sodium 143, potassium 3.7, creatinine 1.05 and GFR 88  3: COPD-  - using nebulizer and inhalers - smoking 1 cigarette ~ every month    Medication bottles were reviewed.

## 2019-12-16 NOTE — Telephone Encounter (Signed)
Phoned Tedd to find out why he missed his appt with [name] appt today.  He states [facility] did not show up.  [name] rescheduled his appt and I advised him of new date and time.  He appeared appreciative.   [name] [place] EMT-Paramedic [phone]

## 2019-12-23 ENCOUNTER — Encounter: Payer: Self-pay | Admitting: Gerontology

## 2019-12-23 ENCOUNTER — Other Ambulatory Visit: Payer: Self-pay

## 2019-12-23 ENCOUNTER — Ambulatory Visit: Payer: Medicaid Other | Admitting: Gerontology

## 2019-12-23 VITALS — BP 193/110 | HR 72 | Ht 68.0 in | Wt 196.6 lb

## 2019-12-23 DIAGNOSIS — E1140 Type 2 diabetes mellitus with diabetic neuropathy, unspecified: Secondary | ICD-10-CM

## 2019-12-23 DIAGNOSIS — I10 Essential (primary) hypertension: Secondary | ICD-10-CM

## 2019-12-23 DIAGNOSIS — Z8709 Personal history of other diseases of the respiratory system: Secondary | ICD-10-CM

## 2019-12-23 DIAGNOSIS — E1169 Type 2 diabetes mellitus with other specified complication: Secondary | ICD-10-CM | POA: Insufficient documentation

## 2019-12-23 DIAGNOSIS — E785 Hyperlipidemia, unspecified: Secondary | ICD-10-CM

## 2019-12-23 MED ORDER — TIOTROPIUM BROMIDE MONOHYDRATE 18 MCG IN CAPS: 18.0000 ug | ORAL_CAPSULE | Freq: Every day | RESPIRATORY_TRACT | 3 refills | Status: DC

## 2019-12-23 MED ORDER — BLOOD PRESSURE KIT: 1.0000 | PACK | Freq: Every day | 0 refills | Status: AC

## 2019-12-23 MED ORDER — PRAVASTATIN SODIUM 10 MG PO TABS: 10.0000 mg | ORAL_TABLET | Freq: Every day | ORAL | 0 refills | Status: DC

## 2019-12-23 NOTE — Progress Notes (Signed)
Established Patient Office Visit  Subjective:  Patient ID: Kevin [name], male    DOB: [birth year]  Age: 64 y.o. MRN: [id number]  CC: No chief complaint on file.   HPI Kevin [name] presents for follow up of type 2 diabetes mellitus, hypertension, lab review and medication refill. His HgbA1c done on 12/15/2019 was 5.9%. His Lipid panel done on 12/15/2019, Total cholesterol was 213 mg/dl and LDL was 158 mg/dl. His blood pressure was elevated during visit at 193/110. He doesn't check his blood pressure at home and states that he has not picked up Entresto 97-103 mg from the Pharmacy. He continues to smoke 4 cigarettes daily and unable to adhere to DASH diet due to limited resources. He denies chest pain, headache, light headedness, palpitation and vision changes. Overall, he states that he's doing well and offers no further complaint.    Past Medical History:  Diagnosis Date  . Chronic combined systolic (congestive) and diastolic (congestive) heart failure ([place])    a. 2019 reported neg stress test and nl cors on cath in [facility]; b. 07/2019 Echo: EF 25-30%, Gr2 DD, glob HK. Mod reduced RV fxn w/ volume overload. Mod dil RA. Mild to mod TR. Mod elev PASP.  Marland Kitchen COPD (chronic obstructive pulmonary disease) ([place])   . Diabetes mellitus without complication ([place])   . Hypertension   . Noncompliance   . Pleural effusion on right    a. 07/2019 Thoracentesis: 300 ml.    No past surgical history on file.  No family history on file.  Social History   Socioeconomic History  . Marital status: Single    Spouse name: Not on file  . Number of children: Not on file  . Years of education: Not on file  . Highest education level: Not on file  Occupational History  . Not on file  Tobacco Use  . Smoking status: Current Some Day Smoker    Packs/day: 0.25    Years: 20.00    Pack years: 5.00  . Smokeless tobacco: Never Used  . Tobacco comment: a pack lasts a week (appx. 4 days)  Substance and Sexual  Activity  . Alcohol use: Never  . Drug use: Not Currently  . Sexual activity: Not Currently  Other Topics Concern  . Not on file  Social History Narrative   Patient moved to [place] from [place] in March of this year, to take care of his Mother. She lives in a community managed by her church, "independent living for seniors." Patient lives with his Mother, does not drive, and reports he mostly keeps to himself.   Social Determinants of Health   Financial Resource Strain: Medium Risk  . Difficulty of Paying Living Expenses: Somewhat hard  Food Insecurity: Food Insecurity Present  . Worried About Charity fundraiser in the Last Year: Often true  . Ran Out of Food in the Last Year: Often true  Transportation Needs: No Transportation Needs  . Lack of Transportation (Medical): No  . Lack of Transportation (Non-Medical): No  Physical Activity: Sufficiently Active  . Days of Exercise per Week: 7 days  . Minutes of Exercise per Session: 60 min  Stress: No Stress Concern Present  . Feeling of Stress : Not at all  Social Connections: Moderately Isolated  . Frequency of Communication with Friends and Family: Three times a week  . Frequency of Social Gatherings with Friends and Family: Never  . Attends Religious Services: Never  . Active Member of Clubs or Organizations:  No  . Attends Club or Organization Meetings: Never  . Marital Status: Never married  Intimate Partner Violence: Not At Risk  . Fear of Current or Ex-Partner: No  . Emotionally Abused: No  . Physically Abused: No  . Sexually Abused: No    Outpatient Medications Prior to Visit  Medication Sig Dispense Refill  . albuterol (VENTOLIN HFA) 108 (90 Base) MCG/ACT inhaler Inhale 2 puffs into the lungs every 6 (six) hours as needed for wheezing or shortness of breath. 18 g 3  . carvedilol (COREG) 25 MG tablet Take 1 tablet (25 mg total) by mouth 2 (two) times daily. 60 tablet 3  . furosemide (LASIX) 40 MG tablet Take 1  tablet (40 mg total) by mouth 2 (two) times daily. 60 tablet 5  . spironolactone (ALDACTONE) 25 MG tablet Take 1 tablet (25 mg total) by mouth daily. 30 tablet 5  . acetaminophen (TYLENOL) 325 MG tablet Take 650 mg by mouth every 6 (six) hours as needed for mild pain or fever.     . calcium carbonate (TUMS - DOSED IN MG ELEMENTAL CALCIUM) 500 MG chewable tablet Chew 1 tablet by mouth daily.    . fluticasone furoate-vilanterol (BREO ELLIPTA) 200-25 MCG/INH AEPB Inhale 1 puff into the lungs daily. (Patient not taking: Reported on 12/23/2019) 60 each 3  . sacubitril-valsartan (ENTRESTO) 49-51 MG Take 1 tablet by mouth 2 (two) times daily.    . sacubitril-valsartan (ENTRESTO) 97-103 MG Take 1 tablet by mouth 2 (two) times daily. (Patient not taking: Reported on 12/07/2019) 180 tablet 3  . gabapentin (NEURONTIN) 300 MG capsule Take 1 capsule (300 mg total) by mouth 2 (two) times daily. (Patient not taking: Reported on 12/14/2019) 60 capsule 5  . tiotropium (SPIRIVA) 18 MCG inhalation capsule Place 1 capsule (18 mcg total) into inhaler and inhale daily. (Patient not taking: Reported on 12/23/2019) 30 capsule 3   No facility-administered medications prior to visit.    No Known Allergies  ROS Review of Systems  Constitutional: Negative.   Eyes: Negative.   Respiratory: Negative.   Cardiovascular: Negative.   Neurological: Negative.   Psychiatric/Behavioral: Negative.       Objective:    Physical Exam  Constitutional: He is oriented to person, place, and time. He appears well-developed.  HENT:  Head: Normocephalic and atraumatic.  Eyes: Pupils are equal, round, and reactive to light. EOM are normal.  Cardiovascular: Normal rate and regular rhythm.  Pulmonary/Chest: Effort normal and breath sounds normal.  Neurological: He is alert and oriented to person, place, and time.  Psychiatric: He has a normal mood and affect. His behavior is normal. Judgment and thought content normal.    BP (!)  193/110 (BP Location: Left Arm, Patient Position: Sitting, Cuff Size: Large)   Pulse 72   Ht _0  (1.727 m)   Wt 196 lb 9.6 oz (89.2 kg)   SpO2 98%   BMI 29.89 kg/m  Wt Readings from Last 3 Encounters:  12/23/19 196 lb 9.6 oz (89.2 kg)  12/15/19 197 lb 12.8 oz (89.7 kg)  12/07/19 198 lb (89.8 kg)     Health Maintenance Due  Topic Date Due  . Hepatitis C Screening  Never done  . PNEUMOCOCCAL POLYSACCHARIDE VACCINE AGE 57-64 HIGH RISK  Never done  . FOOT EXAM  Never done  . OPHTHALMOLOGY EXAM  Never done  . URINE MICROALBUMIN  Never done  . TETANUS/TDAP  Never done  . COLONOSCOPY  Never done    There are  no preventive care reminders to display for this patient.  No results found for: TSH Lab Results  Component Value Date   WBC 8.7 08/04/2019   HGB 10.9 (L) 08/04/2019   HCT 35.3 (L) 08/04/2019   MCV 90.3 08/04/2019   PLT 314 08/04/2019   Lab Results  Component Value Date   NA 143 09/09/2019   K 3.7 09/09/2019   CO2 27 09/09/2019   GLUCOSE 90 09/09/2019   BUN 17 09/09/2019   CREATININE 1.05 09/09/2019   BILITOT 1.0 08/06/2019   ALKPHOS 158 (H) 08/06/2019   AST 27 08/06/2019   ALT 40 08/06/2019   PROT 6.8 08/06/2019   ALBUMIN 2.9 (L) 08/06/2019   CALCIUM 8.6 09/09/2019   ANIONGAP 9 08/09/2019   Lab Results  Component Value Date   CHOL 213 (H) 12/15/2019   Lab Results  Component Value Date   HDL 42 12/15/2019   Lab Results  Component Value Date   LDLCALC 158 (H) 12/15/2019   Lab Results  Component Value Date   TRIG 72 12/15/2019   Lab Results  Component Value Date   CHOLHDL 5.1 (H) 12/15/2019   Lab Results  Component Value Date   HGBA1C 5.9 (H) 12/15/2019      Assessment & Plan:   1. Essential hypertension - His blood pressure is not controlled, he promised to pick up his Entresto from the Pharmacy after office visit. He was provided with blood pressure kit, advised to check, record blood pressure daily and bring log to follow up  appointment. He was advised on smoking cessation and to continue on DASH diet. - Blood Pressure KIT; 1 kit by Does not apply route daily.  Dispense: 1 kit; Refill: 0  2. History of COPD - His COPD is under control and he will continue on current treatment regimen. - tiotropium (SPIRIVA) 18 MCG inhalation capsule; Place 1 capsule (18 mcg total) into inhaler and inhale daily.  Dispense: 30 capsule; Refill: 3  3. Elevated lipids - His ASCVD risk was 47.2% and he will continue on Pravastatin. He was educated on medication side effects and advised to notify clinic. - pravastatin (PRAVACHOL) 10 MG tablet; Take 1 tablet (10 mg total) by mouth daily.  Dispense: 30 tablet; Refill: 0   4. Type 2 diabetes, controlled, with neuropathy ([place]) - His HgbA1c was 5.9%, he declines Metformin therapy, states that he will work on his diet. He was advised to continue on low carb/ non concentrated sweet diet.    Follow-up: Return in about 2 weeks (around 01/06/2020), or if symptoms worsen or fail to improve.    Heydi Swango Jerold Coombe, NP

## 2019-12-23 NOTE — Patient Instructions (Signed)
DASH Eating Plan DASH stands for "Dietary Approaches to Stop Hypertension." The DASH eating plan is a healthy eating plan that has been shown to reduce high blood pressure (hypertension). It may also reduce your risk for type 2 diabetes, heart disease, and stroke. The DASH eating plan may also help with weight loss. What are tips for following this plan?  General guidelines  Avoid eating more than 2,300 mg (milligrams) of salt (sodium) a day. If you have hypertension, you may need to reduce your sodium intake to 1,500 mg a day.  Limit alcohol intake to no more than 1 drink a day for nonpregnant women and 2 drinks a day for men. One drink equals 12 oz of beer, 5 oz of wine, or 1 oz of hard liquor.  Work with your health care provider to maintain a healthy body weight or to lose weight. Ask what an ideal weight is for you.  Get at least 30 minutes of exercise that causes your heart to beat faster (aerobic exercise) most days of the week. Activities may include walking, swimming, or biking.  Work with your health care provider or diet and nutrition specialist (dietitian) to adjust your eating plan to your individual calorie needs. Reading food labels   Check food labels for the amount of sodium per serving. Choose foods with less than 5 percent of the Daily Value of sodium. Generally, foods with less than 300 mg of sodium per serving fit into this eating plan.  To find whole grains, look for the word "whole" as the first word in the ingredient list. Shopping  Buy products labeled as "low-sodium" or "no salt added."  Buy fresh foods. Avoid canned foods and premade or frozen meals. Cooking  Avoid adding salt when cooking. Use salt-free seasonings or herbs instead of table salt or sea salt. Check with your health care provider or pharmacist before using salt substitutes.  Do not fry foods. Cook foods using healthy methods such as baking, boiling, grilling, and broiling instead.  Cook with  heart-healthy oils, such as olive, canola, soybean, or sunflower oil. Meal planning  Eat a balanced diet that includes: ? 5 or more servings of fruits and vegetables each day. At each meal, try to fill half of your plate with fruits and vegetables. ? Up to 6-8 servings of whole grains each day. ? Less than 6 oz of lean meat, poultry, or fish each day. A 3-oz serving of meat is about the same size as a deck of cards. One egg equals 1 oz. ? 2 servings of low-fat dairy each day. ? A serving of nuts, seeds, or beans 5 times each week. ? Heart-healthy fats. Healthy fats called Omega-3 fatty acids are found in foods such as flaxseeds and coldwater fish, like sardines, salmon, and mackerel.  Limit how much you eat of the following: ? Canned or prepackaged foods. ? Food that is high in trans fat, such as fried foods. ? Food that is high in saturated fat, such as fatty meat. ? Sweets, desserts, sugary drinks, and other foods with added sugar. ? Full-fat dairy products.  Do not salt foods before eating.  Try to eat at least 2 vegetarian meals each week.  Eat more home-cooked food and less restaurant, buffet, and fast food.  When eating at a restaurant, ask that your food be prepared with less salt or no salt, if possible. What foods are recommended? The items listed may not be a complete list. Talk with your dietitian about   what dietary choices are best for you. Grains Whole-grain or whole-wheat bread. Whole-grain or whole-wheat pasta. Brown rice. Oatmeal. Quinoa. Bulgur. Whole-grain and low-sodium cereals. Pita bread. Low-fat, low-sodium crackers. Whole-wheat flour tortillas. Vegetables Fresh or frozen vegetables (raw, steamed, roasted, or grilled). Low-sodium or reduced-sodium tomato and vegetable juice. Low-sodium or reduced-sodium tomato sauce and tomato paste. Low-sodium or reduced-sodium canned vegetables. Fruits All fresh, dried, or frozen fruit. Canned fruit in natural juice (without  added sugar). Meat and other protein foods Skinless chicken or turkey. Ground chicken or turkey. Pork with fat trimmed off. Fish and seafood. Egg whites. Dried beans, peas, or lentils. Unsalted nuts, nut butters, and seeds. Unsalted canned beans. Lean cuts of beef with fat trimmed off. Low-sodium, lean deli meat. Dairy Low-fat (1%) or fat-free (skim) milk. Fat-free, low-fat, or reduced-fat cheeses. Nonfat, low-sodium ricotta or cottage cheese. Low-fat or nonfat yogurt. Low-fat, low-sodium cheese. Fats and oils Soft margarine without trans fats. Vegetable oil. Low-fat, reduced-fat, or light mayonnaise and salad dressings (reduced-sodium). Canola, safflower, olive, soybean, and sunflower oils. Avocado. Seasoning and other foods Herbs. Spices. Seasoning mixes without salt. Unsalted popcorn and pretzels. Fat-free sweets. What foods are not recommended? The items listed may not be a complete list. Talk with your dietitian about what dietary choices are best for you. Grains Baked goods made with fat, such as croissants, muffins, or some breads. Dry pasta or rice meal packs. Vegetables Creamed or fried vegetables. Vegetables in a cheese sauce. Regular canned vegetables (not low-sodium or reduced-sodium). Regular canned tomato sauce and paste (not low-sodium or reduced-sodium). Regular tomato and vegetable juice (not low-sodium or reduced-sodium). Pickles. Olives. Fruits Canned fruit in a light or heavy syrup. Fried fruit. Fruit in cream or butter sauce. Meat and other protein foods Fatty cuts of meat. Ribs. Fried meat. Bacon. Sausage. Bologna and other processed lunch meats. Salami. Fatback. Hotdogs. Bratwurst. Salted nuts and seeds. Canned beans with added salt. Canned or smoked fish. Whole eggs or egg yolks. Chicken or turkey with skin. Dairy Whole or 2% milk, cream, and half-and-half. Whole or full-fat cream cheese. Whole-fat or sweetened yogurt. Full-fat cheese. Nondairy creamers. Whipped toppings.  Processed cheese and cheese spreads. Fats and oils Butter. Stick margarine. Lard. Shortening. Ghee. Bacon fat. Tropical oils, such as coconut, palm kernel, or palm oil. Seasoning and other foods Salted popcorn and pretzels. Onion salt, garlic salt, seasoned salt, table salt, and sea salt. Worcestershire sauce. Tartar sauce. Barbecue sauce. Teriyaki sauce. Soy sauce, including reduced-sodium. Steak sauce. Canned and packaged gravies. Fish sauce. Oyster sauce. Cocktail sauce. Horseradish that you find on the shelf. Ketchup. Mustard. Meat flavorings and tenderizers. Bouillon cubes. Hot sauce and Tabasco sauce. Premade or packaged marinades. Premade or packaged taco seasonings. Relishes. Regular salad dressings. Where to find more information:  National Heart, Lung, and Blood Institute: www.nhlbi.nih.gov  American Heart Association: www.heart.org Summary  The DASH eating plan is a healthy eating plan that has been shown to reduce high blood pressure (hypertension). It may also reduce your risk for type 2 diabetes, heart disease, and stroke.  With the DASH eating plan, you should limit salt (sodium) intake to 2,300 mg a day. If you have hypertension, you may need to reduce your sodium intake to 1,500 mg a day.  When on the DASH eating plan, aim to eat more fresh fruits and vegetables, whole grains, lean proteins, low-fat dairy, and heart-healthy fats.  Work with your health care provider or diet and nutrition specialist (dietitian) to adjust your eating plan to your   individual calorie needs. This information is not intended to replace advice given to you by your health care provider. Make sure you discuss any questions you have with your health care provider. Document Revised: 07/25/2017 Document Reviewed: 08/05/2016 Elsevier Patient Education  2020 Else[employer] Inc.  

## 2019-12-27 ENCOUNTER — Telehealth (HOSPITAL_COMMUNITY): Payer: Self-pay

## 2019-12-27 NOTE — Telephone Encounter (Signed)
Contacted Kevin [name] to see how he was doing and to make sure he picked his medications up [facility]  He states he picked them all up and verified them.  He is aware of [name] appt on Friday and advised him they have scheduled ACTA.  He states had to cancel last appt due to unforeseen problems he had.  He states doing well, advsied him to take his medications as prescribed everyday.  Also told him to bring bottles with him to appt on Friday.  He appeared to understand.  Will continue to visit for heart failure.   [name] EMT-Paramedic [phone]

## 2019-12-30 NOTE — Progress Notes (Signed)
Patient ID: Kevin [name], male    DOB: 08-10-1956, 64 y.o.   MRN: [id number]  HPI  Kevin [name] is a 64 y/o male with a history of HTN, COPD, tobacco use and chronic heart failure.   Echo report from 08/04/2019 reviewed and showed an EF of 25-30% along with mild/moderate TR.   Admitted 08/03/2019 due to acute on chronic HF. Cardiology consult obtained. Initially given IV lasix and then transitioned to oral diuretics. Right thoracentesis done with removal of 388m. Oral medications adjusted for his HTN. Given solumedrol for COPD. Discharged after 7 days. Was in the ED 07/29/2019 due to mild HF exacerbation where he was treated and released.   He presents today with a chief complaint of a follow-up visit. He currently has no symptoms and specifically denies any difficulty sleeping, dizziness, abdominal distention, palpitations, pedal edema, chest pain, shortness of breath, cough, fatigue or weight gain.   Has [facility] but did not get the 97/10101mentresto dose.    Past Medical History:  Diagnosis Date  . Chronic combined systolic (congestive) and diastolic (congestive) heart failure ([facility]   a. [facility] Echo: EF 25-30%, Gr2 DD, glob HK. Mod reduced RV fxn w/ volume overload. Mod dil RA. Mild to mod TR. Mod elev PASP.  . Marland KitchenOPD (chronic obstructive pulmonary disease) ([facility]  . Diabetes mellitus without complication ([facility]  . Hypertension   . Noncompliance   . Pleural effusion on right    a. 07/2019 Thoracentesis: 300 ml.    Social History   Tobacco Use  . Smoking status: Current Some Day Smoker    Packs/day: 0.25    Years: 20.00    Pack years: 5.00  . Smokeless tobacco: Never Used  . Tobacco comment: a pack lasts a week (appx. 4 days)  Substance Use Topics  . Alcohol use: Never   No Known Allergies  Prior to Admission medications   Medication Sig Start Date End Date  Taking? Authorizing Provider  albuterol (VENTOLIN HFA) 108 (90 Base) MCG/ACT inhaler Inhale 2 puffs into the lungs every 6 (six) hours as needed for wheezing or shortness of breath. 09/08/19  Yes HaDarylene Price, FNP  Blood Pressure KIT 1 kit by Does not apply route daily. 12/23/19  Yes Iloabachie, Chioma E, NP  carvedilol (COREG) 25 MG tablet Take 1 tablet (25 mg total) by mouth 2 (two) times daily. 11/18/19  Yes Hector Taft A, FNP  fluticasone furoate-vilanterol (BREO ELLIPTA) 200-25 MCG/INH AEPB Inhale 1 puff into the lungs daily. 09/08/19  Yes HaDarylene Price, FNP  furosemide (LASIX) 40 MG tablet Take 1 tablet (40 mg total) by mouth 2 (two) times daily. 11/18/19  Yes Christpoher Sievers, TiOtila Kluver, FNP  gabapentin (NEURONTIN) 300 MG capsule Take 300 mg by mouth 2 (two) times daily.   Yes [provider]  pravastatin (PRAVACHOL) 10 MG tablet Take 1 tablet (10 mg total) by mouth daily. 12/23/19  Yes Iloabachie, Chioma E, NP  sacubitril-valsartan (ENTRESTO) 49-51 MG Take 1 tablet by mouth 2 (two) times daily.   Yes [provider]  spironolactone (ALDACTONE) 25 MG tablet Take 1 tablet (25 mg total) by mouth daily. 11/18/19  Yes HaDarylene Price, FNP  tiotropium (SPIRIVA) 18 MCG inhalation capsule Place 1 capsule (18 mcg total) into inhaler and inhale daily. 12/23/19  Yes Iloabachie, Chioma E, NP     Review  of Systems  Constitutional: Negative for appetite change and fatigue.  HENT: Negative for congestion, postnasal drip and sore throat.   Eyes: Negative.   Respiratory: Negative for cough, chest tightness and shortness of breath.   Cardiovascular: Negative for chest pain, palpitations and leg swelling.  Gastrointestinal: Negative for abdominal distention and abdominal pain.  Endocrine: Negative.   Genitourinary: Negative.   Musculoskeletal: Negative for back pain and neck pain.  Skin: Negative.   Allergic/Immunologic: Negative.   Neurological: Negative for dizziness and light-headedness.   Hematological: Negative for adenopathy. Does not bruise/bleed easily.  Psychiatric/Behavioral: Negative for dysphoric mood and sleep disturbance (sleeping on 1 pillows). The patient is not nervous/anxious.     Vitals:   12/31/19 1008 12/31/19 1051  BP: (!) 167/108 (!) 144/90  Pulse: 68   Resp: 18   SpO2: 100%   Weight: 201 lb (91.2 kg)   Height: '5\' 8"'  (1.727 m)    Wt Readings from Last 3 Encounters:  12/31/19 201 lb (91.2 kg)  12/23/19 196 lb 9.6 oz (89.2 kg)  12/15/19 197 lb 12.8 oz (89.7 kg)   Lab Results  Component Value Date   CREATININE 1.05 09/09/2019   CREATININE 0.95 08/09/2019   CREATININE 1.15 08/08/2019     Physical Exam Vitals and nursing note reviewed.  Constitutional:      Appearance: Normal appearance.  HENT:     Head: Normocephalic and atraumatic.  Neck:     Vascular: No carotid bruit.  Cardiovascular:     Rate and Rhythm: Normal rate and regular rhythm.  Pulmonary:     Effort: Pulmonary effort is normal. No respiratory distress.     Breath sounds: No wheezing or rales.  Abdominal:     General: Abdomen is flat. There is no distension.     Palpations: Abdomen is soft.  Musculoskeletal:        General: No tenderness.     Cervical back: Normal range of motion and neck supple.     Right lower leg: No tenderness. No edema.     Left lower leg: No tenderness. No edema.  Skin:    General: Skin is warm and dry.  Neurological:     General: No focal deficit present.     Mental Status: He is alert and oriented to person, place, and time.  Psychiatric:        Mood and Affect: Mood normal.        Behavior: Behavior normal.    Assessment & Plan:  1: Chronic heart failure with reduced ejection fraction- - NYHA class I - euvolemic today - weighing daily and reinforced to call for an overnight weight gain of >2 pounds or a weekly weight gain of >5 pounds - weight down 2 pounds from last visit here 1 month ago - not adding salt and has been trying to  read food labels; reviewed the importance of closely following a 2012m sodium diet  - sees PCP on 01/11/20  - [facility] has 2 weeks of entresto 97/1054mto give patient while he waits on his shipment to get mailed to him - advised patient to finish current entresto bottle and the bottle he has at home (49/5161mose) and then begin the 97/103m19mse; written and verbal instructions were given and patient verbalizes understanding - will check BMP at his next visit - [place] - participating in paramedicine program - BNP 08/03/2019 was 2085.0 - patient says that he's received both COVID vaccines - PharmD  reconciled medications with the patient  2: HTN- - BP initially elevated (167/108) but then much improved after checking with manual cuff (144/90) - saw PCP at [facility] 12/23/19 - has just recently started checking his BP at home - BMP on 09/09/19 reviewed and showed sodium 143, potassium 3.7, creatinine 1.05 and GFR 88  3: COPD-  - using nebulizer and inhalers - smoking 1 cigarette ~ every month    Medication bottles were reviewed.   Return in 6 weeks or sooner for any questions/problems before then.

## 2019-12-31 ENCOUNTER — Ambulatory Visit: Payer: Medicaid - Out of State | Attending: Family | Admitting: Family

## 2019-12-31 ENCOUNTER — Other Ambulatory Visit: Payer: Self-pay

## 2019-12-31 ENCOUNTER — Encounter: Payer: Self-pay | Admitting: Family

## 2019-12-31 VITALS — BP 144/90 | HR 68 | Resp 18 | Ht 68.0 in | Wt 201.0 lb

## 2019-12-31 DIAGNOSIS — Z7901 Long term (current) use of anticoagulants: Secondary | ICD-10-CM | POA: Insufficient documentation

## 2019-12-31 DIAGNOSIS — F17210 Nicotine dependence, cigarettes, uncomplicated: Secondary | ICD-10-CM | POA: Insufficient documentation

## 2019-12-31 DIAGNOSIS — I5022 Chronic systolic (congestive) heart failure: Secondary | ICD-10-CM

## 2019-12-31 DIAGNOSIS — Z79899 Other long term (current) drug therapy: Secondary | ICD-10-CM | POA: Insufficient documentation

## 2019-12-31 DIAGNOSIS — J449 Chronic obstructive pulmonary disease, unspecified: Secondary | ICD-10-CM | POA: Insufficient documentation

## 2019-12-31 DIAGNOSIS — E119 Type 2 diabetes mellitus without complications: Secondary | ICD-10-CM | POA: Insufficient documentation

## 2019-12-31 DIAGNOSIS — I110 Hypertensive heart disease with heart failure: Secondary | ICD-10-CM | POA: Insufficient documentation

## 2019-12-31 DIAGNOSIS — I10 Essential (primary) hypertension: Secondary | ICD-10-CM

## 2019-12-31 DIAGNOSIS — I5042 Chronic combined systolic (congestive) and diastolic (congestive) heart failure: Secondary | ICD-10-CM | POA: Insufficient documentation

## 2019-12-31 NOTE — Patient Instructions (Addendum)
Continue weighing daily and call for an overnight weight gain of > 2 pounds or a weekly weight gain of >5 pounds.   Finish current entresto 49/51mg  as 1 tablet by mouth twice daily. When you finish the current bottle and the bottle you have at home, you will then start the entresto from [facility] (97/103mg ) and it'll still be 1 tablet twice daily.

## 2019-12-31 NOTE — Progress Notes (Signed)
[place] - PHARMACIST COUNSELING NOTE  ADHERENCE ASSESSMENT  Adherence strategy: Patient takes medications from bottles   Do you ever forget to take your medication? _0 Yes (1) _1 No (0)  Do you ever skip doses due to side effects? _2 Yes (1) _3 No (0)  Do you have trouble affording your medicines? _4 Yes (1) _5 No (0)  Are you ever unable to pick up your medication due to transportation difficulties? _6 Yes (1) _7 No (0)  Do you ever stop taking your medications because you don't believe they are helping? _8 Yes (1) _9 No (0)  Total score 0   Recommendations given to patient about increasing adherence: Patient reports adherence to medications. Patient receives medications through medication management clinic. He does require assistance with transportation. He was confused about how to take some of his medications - provided counseling to clarify.   Guideline-Directed Medical Therapy/Evidence Based Medicine  ACE/ARB/ARNI: Entresto 49-51 mg BID Beta Blocker: Carvedilol 25 mg BID Aldosterone Antagonist: Spironolactone 25 mg daily Diuretic: Furosemide 40 mg BID    SUBJECTIVE  HPI: Patient is a 64 y/o M with a PMH as below who presents to CHF clinic for follow-up  Past Medical History:  Diagnosis Date  . Chronic combined systolic (congestive) and diastolic (congestive) heart failure ([place])    a. 2019 reported neg stress test and nl cors on cath in [facility]; b. 07/2019 Echo: EF 25-30%, Gr2 DD, glob HK. Mod reduced RV fxn w/ volume overload. Mod dil RA. Mild to mod TR. Mod elev PASP.  Marland Kitchen COPD (chronic obstructive pulmonary disease) ([place])   . Diabetes mellitus without complication ([place])   . Hypertension   . Noncompliance   . Pleural effusion on right    a. 07/2019 Thoracentesis: 300 ml.     OBJECTIVE   Vital signs: HR 68, BP 144/90, weight (pounds) 201 ECHO: Date 08/04/2019, EF 25-30%, notes: moderate LVH, LV global hypokinesis, grade II  diastolic dysfunction, right ventricular volume overload, moderately reduced right ventricle systolic function. Mildly enlarged right ventricle. No MVR, mild-moderate TVR, moderately elevated PA systolic pressure  BMP Latest Ref Rng & Units 09/09/2019 08/09/2019 08/08/2019  Glucose 65 - 99 mg/dL 90 154(H) 275(H)  BUN 8 - 27 mg/dL 17 28(H) 24(H)  Creatinine 0.76 - 1.27 mg/dL 1.05 0.95 1.15  BUN/Creat Ratio 10 - 24 16 - -  Sodium 134 - 144 mmol/L 143 141 139  Potassium 3.5 - 5.2 mmol/L 3.7 3.7 3.5  Chloride 96 - 106 mmol/L 102 102 99  CO2 20 - 29 mmol/L _10 Calcium 8.6 - 10.2 mg/dL 8.6 8.5(L) 8.0(L)    ASSESSMENT Patient is well-appearing at clinic visit. He reports breathing has been stable and denies edema. Reports urinating frequently on furosemide. Patient brought his medications with him to clinic. Denies any adverse effects of therapy. He is weighing himself at home and checking blood pressure every other day. He reports he is trying to lose weight to target 185 lbs.    PLAN  1). HFrEF -GDMT with Entresto 49-51 BID, carvedilol 25 mg BID, spironolactone 25 mg daily -Fluid management with furosemide 40 mg BID -Future considerations for therapy include adding an SGLT-2 inhibitor -Discussed with provider - patient will finish out remaining two week supply of Entresto 49-51 BID and then start new prescription of Entresto 97-103 mg BID  2). Hypertension -Antihypertensives include Entresto 49-51 mg BID, carvedilol 25 mg BID, furosemide 40 mg BID, and spironolactone 25 mg daily -Patient is checking blood pressure  at home EOD. Did not bring log today. Encouraged patient to bring log to next visit.  -Patient is still smoking and drinking two 16 oz energy drinks / week. Counseled patient on smoking cessation and avoiding energy drinks  3). T2DM -Last HgbA1c was 5.9% on 12/15/19 which had decreased from 7.2% on 08/15/2019 -Patient declined metformin therapy at last visit with PCP on  12/23/19 in favor of lifestyle modifications -Complications include neuropathic pain. Patient is taking gabapentin 300 mg daily (prescribed as BID) -Trace protein in recent UA. Most recent Scr 1.05 on 09/09/19 from ~1.2 in 07/2019  4). Hyperlipidemia -Last lipid panel 12/15/19 with TG 72, HDL 42, LDL 158 -Patient was prescribed pravastatin 10 mg daily (1/2 of a 20 mg tablet). There was confusion with Rx instructions and patient has been taking 20 mg daily -He has been tolerating pravastatin 20 mg daily. Discussed with provider- will increase pravastatin Rx to 20 mg daily -Patient has been taking statin in the morning. Counseled patient on taking statin in the evening for greater cholesterol lowering  5). COPD -Breo 200-25 mcg/inh 1 puff daily, Spiriva 18 mcg inh daily, albuterol HFA 2 puff twice daily -Patient reports symptoms are controlled -Counseled patient about washing mouth out after using Breo inhaler -Counseled patient on smoking cessation to prevent progression of lung damage   Time spent: 25 minutes  [place] Resident 12/31/2019 10:11 AM    Current Outpatient Medications:  .  acetaminophen (TYLENOL) 325 MG tablet, Take 650 mg by mouth every 6 (six) hours as needed for mild pain or fever. , Disp: , Rfl:  .  albuterol (VENTOLIN HFA) 108 (90 Base) MCG/ACT inhaler, Inhale 2 puffs into the lungs every 6 (six) hours as needed for wheezing or shortness of breath., Disp: 18 g, Rfl: 3 .  Blood Pressure KIT, 1 kit by Does not apply route daily., Disp: 1 kit, Rfl: 0 .  calcium carbonate (TUMS - DOSED IN MG ELEMENTAL CALCIUM) 500 MG chewable tablet, Chew 1 tablet by mouth daily., Disp: , Rfl:  .  carvedilol (COREG) 25 MG tablet, Take 1 tablet (25 mg total) by mouth 2 (two) times daily., Disp: 60 tablet, Rfl: 3 .  fluticasone furoate-vilanterol (BREO ELLIPTA) 200-25 MCG/INH AEPB, Inhale 1 puff into the lungs daily. (Patient not taking: Reported on 12/23/2019), Disp: 60 each,  Rfl: 3 .  furosemide (LASIX) 40 MG tablet, Take 1 tablet (40 mg total) by mouth 2 (two) times daily., Disp: 60 tablet, Rfl: 5 .  pravastatin (PRAVACHOL) 10 MG tablet, Take 1 tablet (10 mg total) by mouth daily., Disp: 30 tablet, Rfl: 0 .  sacubitril-valsartan (ENTRESTO) 49-51 MG, Take 1 tablet by mouth 2 (two) times daily., Disp: , Rfl:  .  sacubitril-valsartan (ENTRESTO) 97-103 MG, Take 1 tablet by mouth 2 (two) times daily. (Patient not taking: Reported on 12/07/2019), Disp: 180 tablet, Rfl: 3 .  spironolactone (ALDACTONE) 25 MG tablet, Take 1 tablet (25 mg total) by mouth daily., Disp: 30 tablet, Rfl: 5 .  tiotropium (SPIRIVA) 18 MCG inhalation capsule, Place 1 capsule (18 mcg total) into inhaler and inhale daily., Disp: 30 capsule, Rfl: 3   COUNSELING POINTS/CLINICAL PEARLS  Carvedilol (Goal: weight less than 85 kg is 25 mg BID, weight greater than 85 kg is 50 mg BID)  Patient should avoid activities requiring coordination until drug effects are realized, as drug may cause dizziness.  This drug may cause diarrhea, nausea, vomiting, arthralgia, back pain, myalgia, headache, vision disorder, erectile dysfunction,  reduced libido, or fatigue.  Instruct patient to report signs/symptoms of adverse cardiovascular effects such as hypotension (especially in elderly patients), arrhythmias, syncope, palpitations, angina, or edema.  Drug may mask symptoms of hypoglycemia. Advise diabetic patients to carefully monitor blood sugar levels.  Patient should take drug with food.  Advise patient against sudden discontinuation of drug. Entresto (Goal: 97/103 mg twice daily)  Warn male patient to avoid pregnancy during therapy and to report a pregnancy to a physician.  Advise patient to report symptomatic hypotension.  Side effects may include hyperkalemia, cough, dizziness, or renal failure. Furosemide  Drug causes sun-sensitivity. Advise patient to use sunscreen and avoid tanning beds. Patient should avoid  activities requiring coordination until drug effects are realized, as drug may cause dizziness, vertigo, or blurred vision. This drug may cause hyperglycemia, hyperuricemia, constipation, diarrhea, loss of appetite, nausea, vomiting, purpuric disorder, cramps, spasticity, asthenia, headache, paresthesia, or scaling eczema. Instruct patient to report unusual bleeding/bruising or signs/symptoms of hypotension, infection, pancreatitis, or ototoxicity (tinnitus, hearing impairment). Advise patient to report signs/symptoms of a severe skin reactions (flu-like symptoms, spreading red rash, or skin/mucous membrane blistering) or erythema multiforme. Instruct patient to eat high-potassium foods during drug therapy, as directed by healthcare professional.  Patient should not drink alcohol while taking this drug. Spironolactone  Warn patient to report dehydration, hypotension, or symptoms of worsening renal function.  Counsel male patient to report gynecomastia.  Side effects may include diarrhea, nausea, vomiting, abdominal cramping, fever, leg cramps, lethargy, mental confusion, decreased libido, irregular menses, and rash. Suspension: Tell patient to take drug consistently with respect to food, either before or after a meal.  Advise patient to avoid potassium supplements and foods containing high levels of potassium, including salt substitutes.  DRUGS TO AVOID IN HEART FAILURE  Drug or Class Mechanism  Analgesics . NSAIDs . COX-2 inhibitors . Glucocorticoids  Sodium and water retention, increased systemic vascular resistance, decreased response to diuretics   Diabetes Medications . Metformin . Thiazolidinediones o Rosiglitazone (Avandia) o Pioglitazone (Actos) . DPP4 Inhibitors o Saxagliptin (Onglyza) o Sitagliptin (Januvia)   Lactic acidosis Possible calcium channel blockade   Unknown  Antiarrhythmics . Class I  o Flecainide o Disopyramide . Class  III o Sotalol . Other o Dronedarone  Negative inotrope, proarrhythmic   Proarrhythmic, beta blockade  Negative inotrope  Antihypertensives . Alpha Blockers o Doxazosin . Calcium Channel Blockers o Diltiazem o Verapamil o Nifedipine . Central Alpha Adrenergics o Moxonidine . Peripheral Vasodilators o Minoxidil  Increases renin and aldosterone  Negative inotrope    Possible sympathetic withdrawal  Unknown  Anti-infective . Itraconazole . Amphotericin B  Negative inotrope Unknown  Hematologic . Anagrelide . Cilostazol   Possible inhibition of PD IV Inhibition of PD III causing arrhythmias  Neurologic/Psychiatric . Stimulants . Anti-Seizure Drugs o Carbamazepine o Pregabalin . Antidepressants o Tricyclics o Citalopram . Parkinsons o Bromocriptine o Pergolide o Pramipexole . Antipsychotics o Clozapine . Antimigraine o Ergotamine o Methysergide . Appetite suppressants . Bipolar o Lithium  Peripheral alpha and beta agonist activity  Negative inotrope and chronotrope Calcium channel blockade  Negative inotrope, proarrhythmic Dose-dependent QT prolongation  Excessive serotonin activity/valvular damage Excessive serotonin activity/valvular damage Unknown  IgE mediated hypersensitivy, calcium channel blockade  Excessive serotonin activity/valvular damage Excessive serotonin activity/valvular damage Valvular damage  Direct myofibrillar degeneration, adrenergic stimulation  Antimalarials . Chloroquine . Hydroxychloroquine Intracellular inhibition of lysosomal enzymes  Urologic Agents . Alpha Blockers o Doxazosin o Prazosin o Tamsulosin o Terazosin  Increased renin and aldosterone  Adapted from Page Carleene Overlie, et al. "Drugs That May Cause or Exacerbate Heart Failure: A Scientific Statement from the [place]." Circulation 2016; 134:e32-e69. DOI: 10.1161/CIR.0000000000000426   MEDICATION ADHERENCES TIPS AND STRATEGIES 1. Taking  medication as prescribed improves patient outcomes in heart failure (reduces hospitalizations, improves symptoms, increases survival) 2. Side effects of medications can be managed by decreasing doses, switching agents, stopping drugs, or adding additional therapy. Please let someone in the [facility] know if you have having bothersome side effects so we can modify your regimen. Do not alter your medication regimen without talking to [place].  3. Medication reminders can help patients remember to take drugs on time. If you are missing or forgetting doses you can try linking behaviors, using pill boxes, or an electronic reminder like an alarm on your phone or an app. Some people can also get automated phone calls as medication reminders.

## 2020-01-11 ENCOUNTER — Ambulatory Visit: Payer: Medicaid Other | Admitting: Gerontology

## 2020-01-11 ENCOUNTER — Telehealth: Payer: Self-pay | Admitting: Gerontology

## 2020-01-11 NOTE — Telephone Encounter (Signed)
-----   Message from Rolm Gala, NP sent at 01/11/2020 12:35 PM EDT ----- Mr Abascal no showed to his appointment, pls reschedule, notify of date, time and pls make a tlephone note. Thank you

## 2020-01-13 ENCOUNTER — Other Ambulatory Visit: Payer: Self-pay

## 2020-01-13 ENCOUNTER — Ambulatory Visit (INDEPENDENT_AMBULATORY_CARE_PROVIDER_SITE_OTHER): Payer: Self-pay

## 2020-01-13 DIAGNOSIS — I428 Other cardiomyopathies: Secondary | ICD-10-CM

## 2020-01-13 DIAGNOSIS — I5042 Chronic combined systolic (congestive) and diastolic (congestive) heart failure: Secondary | ICD-10-CM

## 2020-01-14 ENCOUNTER — Encounter: Payer: Self-pay | Admitting: Cardiology

## 2020-01-14 ENCOUNTER — Ambulatory Visit (INDEPENDENT_AMBULATORY_CARE_PROVIDER_SITE_OTHER): Payer: Self-pay | Admitting: Cardiology

## 2020-01-14 VITALS — BP 150/92 | HR 56 | Ht 68.0 in | Wt 198.0 lb

## 2020-01-14 DIAGNOSIS — F17200 Nicotine dependence, unspecified, uncomplicated: Secondary | ICD-10-CM

## 2020-01-14 DIAGNOSIS — I428 Other cardiomyopathies: Secondary | ICD-10-CM

## 2020-01-14 DIAGNOSIS — I10 Essential (primary) hypertension: Secondary | ICD-10-CM

## 2020-01-14 MED ORDER — FUROSEMIDE 40 MG PO TABS: 40.0000 mg | ORAL_TABLET | Freq: Every day | ORAL | 1 refills | Status: DC

## 2020-01-14 MED ORDER — AMLODIPINE BESYLATE 5 MG PO TABS
5.0000 mg | ORAL_TABLET | Freq: Every day | ORAL | 1 refills | Status: DC
Start: 2020-01-14 — End: 2020-04-27

## 2020-01-14 NOTE — Patient Instructions (Signed)
Medication Instructions:  Your physician has recommended you make the following change in your medication:  1. Decrease your Lasix (Furosemide) dose to 40mg  (1tab) by mouth once a day. 2. Start taking Norvasc (Amlodepine) 5mg  (1tab) by mouth once a day.  *If you need a refill on your cardiac medications before your next appointment, please call your pharmacy*   Lab Work: None Ordered. If you have labs (blood work) drawn today and your tests are completely normal, you will receive your results only by: Marland Kitchen MyChart Message (if you have MyChart) OR . A paper copy in the mail If you have any lab test that is abnormal or we need to change your treatment, we will call you to review the results.   Testing/Procedures: None Ordered.   Follow-Up: At [facility], you and your health needs are our priority.  As part of our continuing mission to provide you with exceptional heart care, we have created designated Provider Care Teams.  These Care Teams include your primary Cardiologist (physician) and Advanced Practice Providers (APPs -  Physician Assistants and Nurse Practitioners) who all work together to provide you with the care you need, when you need it.  We recommend signing up for the patient portal called "MyChart".  Sign up information is provided on this After Visit Summary.  MyChart is used to connect with patients for Virtual Visits (Telemedicine).  Patients are able to view lab/test results, encounter notes, upcoming appointments, etc.  Non-urgent messages can be sent to your provider as well.   To learn more about what you can do with MyChart, go to [email or web address].    Your next appointment:   1 month(s)  The format for your next appointment:   In Person  Provider:   Kate Sable, MD   Other Instructions  Amlodipine Oral Tablets What is this medicine? AMLODIPINE (am LOE di peen) is a calcium channel blocker. It relaxes your blood vessels and decreases the amount  of work the heart has to do. It treats high blood pressure and/or prevents chest pain (also called angina). This medicine may be used for other purposes; ask your health care provider or pharmacist if you have questions. COMMON BRAND NAME(S): Norvasc What should I tell my health care provider before I take this medicine? They need to know if you have any of these conditions:  heart disease  liver disease  an unusual or allergic reaction to amlodipine, other drugs, foods, dyes, or preservatives  pregnant or trying to get pregnant  breast-feeding How should I use this medicine? Take this drug by mouth. Take it as directed on the prescription label at the same time every day. You can take it with or without food. If it upsets your stomach, take it with food. Keep taking it unless your health care provider tells you to stop. Talk to your health care provider about the use of this drug in children. While it may be prescribed for children as young as 6 for selected conditions, precautions do apply. Overdosage: If you think you have taken too much of this medicine contact a poison control center or emergency room at once. NOTE: This medicine is only for you. Do not share this medicine with others. What if I miss a dose? If you miss a dose, take it as soon as you can. If it is almost time for your next dose, take only that dose. Do not take double or extra doses. What may interact with this medicine? This medicine may interact  with the following medications:  clarithromycin  cyclosporine  diltiazem  itraconazole  simvastatin  tacrolimus This list may not describe all possible interactions. Give your health care provider a list of all the medicines, herbs, non-prescription drugs, or dietary supplements you use. Also tell them if you smoke, drink alcohol, or use illegal drugs. Some items may interact with your medicine. What should I watch for while using this medicine? Visit your health  care provider for regular checks on your progress. Check your blood pressure as directed. Ask your health care provider what your blood pressure should be. Also, find out when you should contact him or her. Do not treat yourself for coughs, colds, or pain while you are using this drug without asking your health care provider for advice. Some drugs may increase your blood pressure. You may get drowsy or dizzy. Do not drive, use machinery, or do anything that needs mental alertness until you know how this drug affects you. Do not stand up or sit up quickly, especially if you are an older patient. This reduces the risk of dizzy or fainting spells. What side effects may I notice from receiving this medicine? Side effects that you should report to your doctor or health care provider as soon as possible:  allergic reactions (skin rash, itching or hives; swelling of the face, lips, or tongue)  heart attack (trouble breathing; pain or tightness in the chest, neck, back or arms; unusually weak or tired)  low blood pressure (dizziness; feeling faint or lightheaded, falls; unusually weak or tired) Side effects that usually do not require medical attention (report these to your doctor or health care provider if they continue or are bothersome):  facial flushing  nausea  palpitations  stomach pain  sudden weight gain  swelling of the ankles, feet, hands This list may not describe all possible side effects. Call your doctor for medical advice about side effects. You may report side effects to FDA at 1-800-FDA-1088. Where should I keep my medicine? Keep out of the reach of children and pets. Store at room temperature between 59 and 86 degrees F (15 and 30 degrees C). Protect from light and moisture. Keep the container tightly closed. Throw away any unused drug after the expiration date. NOTE: This sheet is a summary. It may not cover all possible information. If you have questions about this medicine,  talk to your doctor, pharmacist, or health care provider.  2020 Elsevier/Gold Standard (2019-05-18 19:39:45)

## 2020-01-14 NOTE — Progress Notes (Signed)
Cardiology Office Note:    Date:  01/14/2020   ID:  Kevin [name], DOB March 05, 1956, MRN [id number]  PCP:  Patient, No Pcp Per  Cardiologist:  Kate Sable, MD  Electrophysiologist:  None   Referring MD: No ref. provider found   Chief Complaint  Patient presents with  . Other    Follow up post ECHO. Meds reviewed verbally with patient.     History of Present Illness:    Kevin [name] is a 64 y.o. male with a hx of hypertension, HFrEF last EF 25 to 30%, current smoker x20+ years, COPD who presents for follow-up.  He is being seen for nonischemic cardiomyopathy.  His Entresto dose was titrated,.  He is on maximal doses of Aldactone and Coreg.  Echocardiogram was ordered to assess ejection fraction on optimal CHF medications.  He states doing okay, denies edema, orthopnea, shortness of breath.  His weight has stayed stable.  Currently on Lasix 40 mg twice daily.  No questions or concerns at this time.  Historical notes Patient was admitted to the hospital with shortness of breath in December 2020.  He was not on heart failure medicines at the time due to moving from [place] and had some difficulty with refilling his meds.  He was appropriately diuresed and then discharged.  Last echocardiogram obtained on 07/2019 showed EF of 25-30%.  Left heart cath in 2019 showed no obstructive CAD.   Past Medical History:  Diagnosis Date  . Chronic combined systolic (congestive) and diastolic (congestive) heart failure ([place])    a. 2019 reported neg stress test and nl cors on cath in [facility]; b. 07/2019 Echo: EF 25-30%, Gr2 DD, glob HK. Mod reduced RV fxn w/ volume overload. Mod dil RA. Mild to mod TR. Mod elev PASP.  Marland Kitchen COPD (chronic obstructive pulmonary disease) ([place])   . Diabetes mellitus without complication ([place])   . Hypertension   . Noncompliance   . Pleural effusion on right    a. 07/2019 Thoracentesis: 300 ml.    History reviewed. No pertinent surgical history.  Current  Medications: Current Meds  Medication Sig  . albuterol (VENTOLIN HFA) 108 (90 Base) MCG/ACT inhaler Inhale 2 puffs into the lungs every 6 (six) hours as needed for wheezing or shortness of breath.  . Blood Pressure KIT 1 kit by Does not apply route daily.  . carvedilol (COREG) 25 MG tablet Take 1 tablet (25 mg total) by mouth 2 (two) times daily.  . fluticasone furoate-vilanterol (BREO ELLIPTA) 200-25 MCG/INH AEPB Inhale 1 puff into the lungs daily.  . furosemide (LASIX) 40 MG tablet Take 1 tablet (40 mg total) by mouth daily.  Marland Kitchen gabapentin (NEURONTIN) 300 MG capsule Take 300 mg by mouth 2 (two) times daily.  . pravastatin (PRAVACHOL) 20 MG tablet Take 20 mg by mouth daily.  . sacubitril-valsartan (ENTRESTO) 97-103 MG Take 1 tablet by mouth 2 (two) times daily.  Marland Kitchen spironolactone (ALDACTONE) 25 MG tablet Take 1 tablet (25 mg total) by mouth daily.  Marland Kitchen tiotropium (SPIRIVA) 18 MCG inhalation capsule Place 1 capsule (18 mcg total) into inhaler and inhale daily.  . [DISCONTINUED] furosemide (LASIX) 40 MG tablet Take 1 tablet (40 mg total) by mouth 2 (two) times daily.     Allergies:   Patient has no known allergies.   Social History   Socioeconomic History  . Marital status: Single    Spouse name: Not on file  . Number of children: Not on file  . Years of education: Not  on file  . Highest education level: Not on file  Occupational History  . Not on file  Tobacco Use  . Smoking status: Current Some Day Smoker    Packs/day: 0.25    Years: 20.00    Pack years: 5.00  . Smokeless tobacco: Never Used  . Tobacco comment: a pack lasts a week (appx. 4 days)  Substance and Sexual Activity  . Alcohol use: Never  . Drug use: Not Currently  . Sexual activity: Not Currently  Other Topics Concern  . Not on file  Social History Narrative   Patient moved to [place] from [place] in March of this year, to take care of his Mother. She lives in a community managed by her church, "independent  living for seniors." Patient lives with his Mother, does not drive, and reports he mostly keeps to himself.   Social Determinants of Health   Financial Resource Strain: Medium Risk  . Difficulty of Paying Living Expenses: Somewhat hard  Food Insecurity: Food Insecurity Present  . Worried About Charity fundraiser in the Last Year: Often true  . Ran Out of Food in the Last Year: Often true  Transportation Needs: No Transportation Needs  . Lack of Transportation (Medical): No  . Lack of Transportation (Non-Medical): No  Physical Activity: Sufficiently Active  . Days of Exercise per Week: 7 days  . Minutes of Exercise per Session: 60 min  Stress: No Stress Concern Present  . Feeling of Stress : Not at all  Social Connections: Moderately Isolated  . Frequency of Communication with Friends and Family: Three times a week  . Frequency of Social Gatherings with Friends and Family: Never  . Attends Religious Services: Never  . Active Member of Clubs or Organizations: No  . Attends Archivist Meetings: Never  . Marital Status: Never married     Family History: Denies any family history of heart disease ROS:   Please see the history of present illness.     All other systems reviewed and are negative.  EKGs/Labs/Other Studies Reviewed:    The following studies were reviewed today:   EKG:  EKG is  ordered today.  The ekg ordered today demonstrates sinus bradycardia, heart rate 54  Recent Labs: 08/03/2019: B Natriuretic Peptide 2,085.0; Magnesium 1.9 08/04/2019: Hemoglobin 10.9; Platelets 314 08/06/2019: ALT 40 09/09/2019: BUN 17; Creatinine, Ser 1.05; Potassium 3.7; Sodium 143  Recent Lipid Panel    Component Value Date/Time   CHOL 213 (H) 12/15/2019 1219   TRIG 72 12/15/2019 1219   HDL 42 12/15/2019 1219   CHOLHDL 5.1 (H) 12/15/2019 1219   LDLCALC 158 (H) 12/15/2019 1219    Physical Exam:    VS:  BP (!) 150/92 (BP Location: Left Arm, Patient Position: Sitting, Cuff  Size: Normal)   Pulse (!) 56   Ht _0  (1.727 m)   Wt 198 lb (89.8 kg)   SpO2 98%   BMI 30.11 kg/m     Wt Readings from Last 3 Encounters:  01/14/20 198 lb (89.8 kg)  12/31/19 201 lb (91.2 kg)  12/23/19 196 lb 9.6 oz (89.2 kg)     GEN:  Well nourished, well developed in no acute distress HEENT: Normal NECK: No JVD; No carotid bruits LYMPHATICS: No lymphadenopathy CARDIAC: RRR, no murmurs, rubs, gallops RESPIRATORY:  Clear to auscultation without rales, wheezing or rhonchi  ABDOMEN: Soft, non-tender, non-distended MUSCULOSKELETAL:  No edema; No deformity  SKIN: Warm and dry NEUROLOGIC:  Alert and oriented x  3 PSYCHIATRIC:  Normal affect   ASSESSMENT:    1. NICM (nonischemic cardiomyopathy) ([place])   2. Essential hypertension   3. Smoking    PLAN:    In order of problems listed above:  1. Patient with history of nonischemic cardiomyopathy, prior ejection fraction 25 to 30%.  Repeat echocardiogram shows marked improvement in ejection fraction, current LVEF 50 to 55%.  Continue Entresto to 97/103, continue Coreg 25 twice daily, Aldactone 25 mg daily.  Decrease Lasix to 40 mg daily.  Patient encouraged to check weights daily.  Take extra dose of Lasix if he gains 3 pounds in a day or 5 pounds in a week. 2. History of hypertension, blood pressure elevated.  Continue Coreg, Aldactone, Entresto as prescribed.  Start amlodipine 5 mg daily. 3. Patient is still a current smoker.  Smoking cessation advised.   patient.  Follow-up in 1 month.  Total encounter time more than 45 minutes  Greater than 50% was spent in counseling and coordination of care with the patient.  Time spent counseling patient on ICD indications, smoking cessation, echocardiogram results, medication management, etiologies for heart failure explained to patient.    Medication Adjustments/Labs and Tests Ordered: Current medicines are reviewed at length with the patient today.  Concerns regarding medicines are  outlined above.  No orders of the defined types were placed in this encounter.  Meds ordered this encounter  Medications  . amLODipine (NORVASC) 5 MG tablet    Sig: Take 1 tablet (5 mg total) by mouth daily.    Dispense:  90 tablet    Refill:  1  . furosemide (LASIX) 40 MG tablet    Sig: Take 1 tablet (40 mg total) by mouth daily.    Dispense:  90 tablet    Refill:  1    Dose decrease    Patient Instructions  Medication Instructions:  Your physician has recommended you make the following change in your medication:  1. Decrease your Lasix (Furosemide) dose to '40mg'$  (1tab) by mouth once a day. 2. Start taking Norvasc (Amlodepine) '5mg'$  (1tab) by mouth once a day.  *If you need a refill on your cardiac medications before your next appointment, please call your pharmacy*   Lab Work: None Ordered. If you have labs (blood work) drawn today and your tests are completely normal, you will receive your results only by: Marland Kitchen MyChart Message (if you have MyChart) OR . A paper copy in the mail If you have any lab test that is abnormal or we need to change your treatment, we will call you to review the results.   Testing/Procedures: None Ordered.   Follow-Up: At [facility], you and your health needs are our priority.  As part of our continuing mission to provide you with exceptional heart care, we have created designated Provider Care Teams.  These Care Teams include your primary Cardiologist (physician) and Advanced Practice Providers (APPs -  Physician Assistants and Nurse Practitioners) who all work together to provide you with the care you need, when you need it.  We recommend signing up for the patient portal called "MyChart".  Sign up information is provided on this After Visit Summary.  MyChart is used to connect with patients for Virtual Visits (Telemedicine).  Patients are able to view lab/test results, encounter notes, upcoming appointments, etc.  Non-urgent messages can be sent to  your provider as well.   To learn more about what you can do with MyChart, go to [email or web address].    Your  next appointment:   1 month(s)  The format for your next appointment:   In Person  Provider:   Kate Sable, MD   Other Instructions  Amlodipine Oral Tablets What is this medicine? AMLODIPINE (am LOE di peen) is a calcium channel blocker. It relaxes your blood vessels and decreases the amount of work the heart has to do. It treats high blood pressure and/or prevents chest pain (also called angina). This medicine may be used for other purposes; ask your health care provider or pharmacist if you have questions. COMMON BRAND NAME(S): Norvasc What should I tell my health care provider before I take this medicine? They need to know if you have any of these conditions:  heart disease  liver disease  an unusual or allergic reaction to amlodipine, other drugs, foods, dyes, or preservatives  pregnant or trying to get pregnant  breast-feeding How should I use this medicine? Take this drug by mouth. Take it as directed on the prescription label at the same time every day. You can take it with or without food. If it upsets your stomach, take it with food. Keep taking it unless your health care provider tells you to stop. Talk to your health care provider about the use of this drug in children. While it may be prescribed for children as young as 6 for selected conditions, precautions do apply. Overdosage: If you think you have taken too much of this medicine contact a poison control center or emergency room at once. NOTE: This medicine is only for you. Do not share this medicine with others. What if I miss a dose? If you miss a dose, take it as soon as you can. If it is almost time for your next dose, take only that dose. Do not take double or extra doses. What may interact with this medicine? This medicine may interact with the following  medications:  clarithromycin  cyclosporine  diltiazem  itraconazole  simvastatin  tacrolimus This list may not describe all possible interactions. Give your health care provider a list of all the medicines, herbs, non-prescription drugs, or dietary supplements you use. Also tell them if you smoke, drink alcohol, or use illegal drugs. Some items may interact with your medicine. What should I watch for while using this medicine? Visit your health care provider for regular checks on your progress. Check your blood pressure as directed. Ask your health care provider what your blood pressure should be. Also, find out when you should contact him or her. Do not treat yourself for coughs, colds, or pain while you are using this drug without asking your health care provider for advice. Some drugs may increase your blood pressure. You may get drowsy or dizzy. Do not drive, use machinery, or do anything that needs mental alertness until you know how this drug affects you. Do not stand up or sit up quickly, especially if you are an older patient. This reduces the risk of dizzy or fainting spells. What side effects may I notice from receiving this medicine? Side effects that you should report to your doctor or health care provider as soon as possible:  allergic reactions (skin rash, itching or hives; swelling of the face, lips, or tongue)  heart attack (trouble breathing; pain or tightness in the chest, neck, back or arms; unusually weak or tired)  low blood pressure (dizziness; feeling faint or lightheaded, falls; unusually weak or tired) Side effects that usually do not require medical attention (report these to your doctor or health  care provider if they continue or are bothersome):  facial flushing  nausea  palpitations  stomach pain  sudden weight gain  swelling of the ankles, feet, hands This list may not describe all possible side effects. Call your doctor for medical advice about  side effects. You may report side effects to FDA at 1-800-FDA-1088. Where should I keep my medicine? Keep out of the reach of children and pets. Store at room temperature between 59 and 86 degrees F (15 and 30 degrees C). Protect from light and moisture. Keep the container tightly closed. Throw away any unused drug after the expiration date. NOTE: This sheet is a summary. It may not cover all possible information. If you have questions about this medicine, talk to your doctor, pharmacist, or health care provider.  2020 Elsevier/Gold Standard (2019-05-18 19:39:45)       Signed, Kate Sable, MD  01/14/2020 5:03 PM    [place]

## 2020-01-20 ENCOUNTER — Other Ambulatory Visit: Payer: Self-pay

## 2020-01-20 ENCOUNTER — Ambulatory Visit: Payer: Medicaid Other | Admitting: Gerontology

## 2020-01-20 VITALS — BP 171/104 | HR 58 | Temp 97.9°F

## 2020-01-20 DIAGNOSIS — I10 Essential (primary) hypertension: Secondary | ICD-10-CM

## 2020-01-21 NOTE — Progress Notes (Signed)
Established Patient Office Visit  Subjective:  Patient ID: Kevin [name], male    DOB: 19-Nov-1955  Age: 64 y.o. MRN: [id number]  CC: No chief complaint on file.   HPI Kevin [name] presents for follow up of Hypertension. His blood pressure was elevated and he reports smoking cigarette prior 10 minutes prior to his clinic visit. He states that he checks his blood pressure daily and it's usually less than 140/90, states that he is compliant with his treatment regimen, and adheres to DASH diet. He denies chest pain, light headedness, weakness and vision changes. He continues to smoke 5 cigarettes daily and admits the desire to quit. He was seen by the Cardiologist on 01/14/2020 Dr Garen Lah B, his EKG done was sinus bradycardia, his repeat ECHO shows improvement in his EF, current LVEF 50 to 55%. It was recommended that he continues Coreg 25 mg bid, Aldactone 25 mg daily,Lasix decreased to 40 mg daily, Amlodipine 5 mg was started.Overall, he states that he's doing well and offers no further complaint.  Past Medical History:  Diagnosis Date  . Chronic combined systolic (congestive) and diastolic (congestive) heart failure ([place])    a. 2019 reported neg stress test and nl cors on cath in [facility]; b. 07/2019 Echo: EF 25-30%, Gr2 DD, glob HK. Mod reduced RV fxn w/ volume overload. Mod dil RA. Mild to mod TR. Mod elev PASP.  Marland Kitchen COPD (chronic obstructive pulmonary disease) ([place])   . Diabetes mellitus without complication ([place])   . Hypertension   . Noncompliance   . Pleural effusion on right    a. 07/2019 Thoracentesis: 300 ml.    No past surgical history on file.  No family history on file.  Social History   Socioeconomic History  . Marital status: Single    Spouse name: Not on file  . Number of children: Not on file  . Years of education: Not on file  . Highest education level: Not on file  Occupational History  . Not on file  Tobacco Use  . Smoking status: Current Some Day Smoker   Packs/day: 0.25    Years: 20.00    Pack years: 5.00  . Smokeless tobacco: Never Used  . Tobacco comment: a pack lasts a week (appx. 4 days)  Substance and Sexual Activity  . Alcohol use: Never  . Drug use: Not Currently  . Sexual activity: Not Currently  Other Topics Concern  . Not on file  Social History Narrative   Patient moved to [place] from [place] in March of this year, to take care of his Mother. She lives in a community managed by her church, "independent living for seniors." Patient lives with his Mother, does not drive, and reports he mostly keeps to himself.   Social Determinants of Health   Financial Resource Strain: Medium Risk  . Difficulty of Paying Living Expenses: Somewhat hard  Food Insecurity: Food Insecurity Present  . Worried About Charity fundraiser in the Last Year: Sometimes true  . Ran Out of Food in the Last Year: Sometimes true  Transportation Needs: Unmet Transportation Needs  . Lack of Transportation (Medical): Yes  . Lack of Transportation (Non-Medical): Yes  Physical Activity: Insufficiently Active  . Days of Exercise per Week: 2 days  . Minutes of Exercise per Session: 30 min  Stress: No Stress Concern Present  . Feeling of Stress : Not at all  Social Connections: Somewhat Isolated  . Frequency of Communication with Friends and Family: Once a  week  . Frequency of Social Gatherings with Friends and Family: Once a week  . Attends Religious Services: 1 to 4 times per year  . Active Member of Clubs or Organizations: Yes  . Attends Archivist Meetings: Never  . Marital Status: Never married  Intimate Partner Violence: Not At Risk  . Fear of Current or Ex-Partner: No  . Emotionally Abused: No  . Physically Abused: No  . Sexually Abused: No    Outpatient Medications Prior to Visit  Medication Sig Dispense Refill  . albuterol (VENTOLIN HFA) 108 (90 Base) MCG/ACT inhaler Inhale 2 puffs into the lungs every 6 (six) hours as  needed for wheezing or shortness of breath. 18 g 3  . amLODipine (NORVASC) 5 MG tablet Take 1 tablet (5 mg total) by mouth daily. 90 tablet 1  . Blood Pressure KIT 1 kit by Does not apply route daily. 1 kit 0  . carvedilol (COREG) 25 MG tablet Take 1 tablet (25 mg total) by mouth 2 (two) times daily. 60 tablet 3  . fluticasone furoate-vilanterol (BREO ELLIPTA) 200-25 MCG/INH AEPB Inhale 1 puff into the lungs daily. 60 each 3  . furosemide (LASIX) 40 MG tablet Take 1 tablet (40 mg total) by mouth daily. 90 tablet 1  . gabapentin (NEURONTIN) 300 MG capsule Take 300 mg by mouth 2 (two) times daily.    . pravastatin (PRAVACHOL) 20 MG tablet Take 20 mg by mouth daily.    . sacubitril-valsartan (ENTRESTO) 97-103 MG Take 1 tablet by mouth 2 (two) times daily. 180 tablet 3  . spironolactone (ALDACTONE) 25 MG tablet Take 1 tablet (25 mg total) by mouth daily. 30 tablet 5  . tiotropium (SPIRIVA) 18 MCG inhalation capsule Place 1 capsule (18 mcg total) into inhaler and inhale daily. 30 capsule 3   No facility-administered medications prior to visit.    No Known Allergies  ROS Review of Systems  Constitutional: Negative.   Eyes: Negative.   Respiratory: Negative.   Cardiovascular: Negative.   Neurological: Negative.   Psychiatric/Behavioral: Negative.       Objective:    Physical Exam  Constitutional: He is oriented to person, place, and time. He appears well-developed.  HENT:  Head: Normocephalic.  Eyes: Pupils are equal, round, and reactive to light. EOM are normal.  Cardiovascular: Normal rate and regular rhythm.  Pulmonary/Chest: Effort normal and breath sounds normal.  Neurological: He is alert and oriented to person, place, and time.  Psychiatric: He has a normal mood and affect. His behavior is normal. Judgment and thought content normal.    BP (!) 171/104 (BP Location: Left Arm, Patient Position: Sitting, Cuff Size: Large)   Pulse (!) 58   Temp 97.9 F (36.6 C)  Wt Readings  from Last 3 Encounters:  01/14/20 198 lb (89.8 kg)  12/31/19 201 lb (91.2 kg)  12/23/19 196 lb 9.6 oz (89.2 kg)     Health Maintenance Due  Topic Date Due  . Hepatitis C Screening  Never done  . PNEUMOCOCCAL POLYSACCHARIDE VACCINE AGE 37-64 HIGH RISK  Never done  . FOOT EXAM  Never done  . OPHTHALMOLOGY EXAM  Never done  . URINE MICROALBUMIN  Never done  . TETANUS/TDAP  Never done  . COLONOSCOPY  Never done    There are no preventive care reminders to display for this patient.  No results found for: TSH Lab Results  Component Value Date   WBC 8.7 08/04/2019   HGB 10.9 (L) 08/04/2019   HCT  35.3 (L) 08/04/2019   MCV 90.3 08/04/2019   PLT 314 08/04/2019   Lab Results  Component Value Date   NA 143 09/09/2019   K 3.7 09/09/2019   CO2 27 09/09/2019   GLUCOSE 90 09/09/2019   BUN 17 09/09/2019   CREATININE 1.05 09/09/2019   BILITOT 1.0 08/06/2019   ALKPHOS 158 (H) 08/06/2019   AST 27 08/06/2019   ALT 40 08/06/2019   PROT 6.8 08/06/2019   ALBUMIN 2.9 (L) 08/06/2019   CALCIUM 8.6 09/09/2019   ANIONGAP 9 08/09/2019   Lab Results  Component Value Date   CHOL 213 (H) 12/15/2019   Lab Results  Component Value Date   HDL 42 12/15/2019   Lab Results  Component Value Date   LDLCALC 158 (H) 12/15/2019   Lab Results  Component Value Date   TRIG 72 12/15/2019   Lab Results  Component Value Date   CHOLHDL 5.1 (H) 12/15/2019   Lab Results  Component Value Date   HGBA1C 5.9 (H) 12/15/2019      Assessment & Plan:    1. Essential hypertension - His blood pressure was not under control, his goal should be less than 130/80. He will continue on current treatment regimen, check blood pressure daily, record and bring log to follow up appointment. He will continue on DASH diet and exercise as tolerated. He was strongly encouraged on smoking cessation, provided with [place] Quit line information.    Follow-up: Return in about 20 days (around 02/09/2020), or if symptoms  worsen or fail to improve.    Kadeem Hyle Jerold Coombe, NP

## 2020-01-21 NOTE — Patient Instructions (Signed)
   Managing Your Hypertension Hypertension is commonly called high blood pressure. This is when the force of your blood pressing against the walls of your arteries is too strong. Arteries are blood vessels that carry blood from your heart throughout your body. Hypertension forces the heart to work harder to pump blood, and may cause the arteries to become narrow or stiff. Having untreated or uncontrolled hypertension can cause heart attack, stroke, kidney disease, and other problems. What are blood pressure readings? A blood pressure reading consists of a higher number over a lower number. Ideally, your blood pressure should be below 120/80. The first ("top") number is called the systolic pressure. It is a measure of the pressure in your arteries as your heart beats. The second ("bottom") number is called the diastolic pressure. It is a measure of the pressure in your arteries as the heart relaxes. What does my blood pressure reading mean? Blood pressure is classified into four stages. Based on your blood pressure reading, your health care provider may use the following stages to determine what type of treatment you need, if any. Systolic pressure and diastolic pressure are measured in a unit called mm Hg. Normal  Systolic pressure: below 120.  Diastolic pressure: below 80. Elevated  Systolic pressure: 120-129.  Diastolic pressure: below 80. Hypertension stage 1  Systolic pressure: 130-139.  Diastolic pressure: 80-89. Hypertension stage 2  Systolic pressure: 140 or above.  Diastolic pressure: 90 or above. What health risks are associated with hypertension? Managing your hypertension is an important responsibility. Uncontrolled hypertension can lead to:  A heart attack.  A stroke.  A weakened blood vessel (aneurysm).  Heart failure.  Kidney damage.  Eye damage.  Metabolic syndrome.  Memory and concentration problems. What changes can I make to manage my  hypertension? Hypertension can be managed by making lifestyle changes and possibly by taking medicines. Your health care provider will help you make a plan to bring your blood pressure within a normal range. Eating and drinking   Eat a diet that is high in fiber and potassium, and low in salt (sodium), added sugar, and fat. An example eating plan is called the DASH (Dietary Approaches to Stop Hypertension) diet. To eat this way: ? Eat plenty of fresh fruits and vegetables. Try to fill half of your plate at each meal with fruits and vegetables. ? Eat whole grains, such as whole wheat pasta, brown rice, or whole grain bread. Fill about one quarter of your plate with whole grains. ? Eat low-fat diary products. ? Avoid fatty cuts of meat, processed or cured meats, and poultry with skin. Fill about one quarter of your plate with lean proteins such as fish, chicken without skin, beans, eggs, and tofu. ? Avoid premade and processed foods. These tend to be higher in sodium, added sugar, and fat.  Reduce your daily sodium intake. Most people with hypertension should eat less than 1,500 mg of sodium a day.  Limit alcohol intake to no more than 1 drink a day for nonpregnant women and 2 drinks a day for men. One drink equals 12 oz of beer, 5 oz of wine, or 1 oz of hard liquor. Lifestyle  Work with your health care provider to maintain a healthy body weight, or to lose weight. Ask what an ideal weight is for you.  Get at least 30 minutes of exercise that causes your heart to beat faster (aerobic exercise) most days of the week. Activities may include walking, swimming, or biking.    Include exercise to strengthen your muscles (resistance exercise), such as weight lifting, as part of your weekly exercise routine. Try to do these types of exercises for 30 minutes at least 3 days a week.  Do not use any products that contain nicotine or tobacco, such as cigarettes and e-cigarettes. If you need help quitting,  ask your health care provider.  Control any long-term (chronic) conditions you have, such as high cholesterol or diabetes. Monitoring  Monitor your blood pressure at home as told by your health care provider. Your personal target blood pressure may vary depending on your medical conditions, your age, and other factors.  Have your blood pressure checked regularly, as often as told by your health care provider. Working with your health care provider  Review all the medicines you take with your health care provider because there may be side effects or interactions.  Talk with your health care provider about your diet, exercise habits, and other lifestyle factors that may be contributing to hypertension.  Visit your health care provider regularly. Your health care provider can help you create and adjust your plan for managing hypertension. Will I need medicine to control my blood pressure? Your health care provider may prescribe medicine if lifestyle changes are not enough to get your blood pressure under control, and if:  Your systolic blood pressure is 130 or higher.  Your diastolic blood pressure is 80 or higher. Take medicines only as told by your health care provider. Follow the directions carefully. Blood pressure medicines must be taken as prescribed. The medicine does not work as well when you skip doses. Skipping doses also puts you at risk for problems. Contact a health care provider if:  You think you are having a reaction to medicines you have taken.  You have repeated (recurrent) headaches.  You feel dizzy.  You have swelling in your ankles.  You have trouble with your vision. Get help right away if:  You develop a severe headache or confusion.  You have unusual weakness or numbness, or you feel faint.  You have severe pain in your chest or abdomen.  You vomit repeatedly.  You have trouble breathing. Summary  Hypertension is when the force of blood pumping  through your arteries is too strong. If this condition is not controlled, it may put you at risk for serious complications.  Your personal target blood pressure may vary depending on your medical conditions, your age, and other factors. For most people, a normal blood pressure is less than 120/80.  Hypertension is managed by lifestyle changes, medicines, or both. Lifestyle changes include weight loss, eating a healthy, low-sodium diet, exercising more, and limiting alcohol. This information is not intended to replace advice given to you by your health care provider. Make sure you discuss any questions you have with your health care provider. Document Revised: 12/04/2018 Document Reviewed: 07/10/2016 Elsevier Patient Education  2020 [employer] Inc.  

## 2020-02-09 ENCOUNTER — Telehealth (HOSPITAL_COMMUNITY): Payer: Self-pay

## 2020-02-09 NOTE — Telephone Encounter (Signed)
Today contacted Jcion to remind him of his appt with [name] tomorrow.  He states he has been doing good.  He states feels fine, taking all his medications.  He states he has all his medications.  He almost to get refills.  He is aware of [facility]ts [name].  [facility] is scheduled for him, he was wanting to know if his mother could ride with him, advised him to contact [facility], he states he will.   He is aware to contact medication management for refills and pick them up there.   Will make a home visit next week.  He states has plenty food and necessities for everyday living. Will visit for heart failure and medication management.   [name] Willard EMT-Paramedic [phone]

## 2020-02-10 ENCOUNTER — Other Ambulatory Visit: Payer: Self-pay

## 2020-02-10 ENCOUNTER — Ambulatory Visit: Payer: Medicaid - Out of State | Attending: Family | Admitting: Family

## 2020-02-10 ENCOUNTER — Encounter: Payer: Self-pay | Admitting: Family

## 2020-02-10 ENCOUNTER — Ambulatory Visit: Payer: Medicaid Other | Admitting: Gerontology

## 2020-02-10 ENCOUNTER — Other Ambulatory Visit
Admission: RE | Admit: 2020-02-10 | Discharge: 2020-02-10 | Disposition: A | Discharge status: Home / Self Care | Payer: Medicaid - Out of State | Source: Ambulatory Visit | Attending: Family | Admitting: Family

## 2020-02-10 VITALS — BP 114/79 | HR 66 | Resp 18 | Ht 68.0 in | Wt 195.0 lb

## 2020-02-10 DIAGNOSIS — I110 Hypertensive heart disease with heart failure: Secondary | ICD-10-CM | POA: Insufficient documentation

## 2020-02-10 DIAGNOSIS — Z79899 Other long term (current) drug therapy: Secondary | ICD-10-CM | POA: Diagnosis not present

## 2020-02-10 DIAGNOSIS — I5042 Chronic combined systolic (congestive) and diastolic (congestive) heart failure: Secondary | ICD-10-CM | POA: Insufficient documentation

## 2020-02-10 DIAGNOSIS — F17210 Nicotine dependence, cigarettes, uncomplicated: Secondary | ICD-10-CM | POA: Diagnosis not present

## 2020-02-10 DIAGNOSIS — I10 Essential (primary) hypertension: Secondary | ICD-10-CM

## 2020-02-10 DIAGNOSIS — J449 Chronic obstructive pulmonary disease, unspecified: Secondary | ICD-10-CM | POA: Insufficient documentation

## 2020-02-10 DIAGNOSIS — E119 Type 2 diabetes mellitus without complications: Secondary | ICD-10-CM | POA: Diagnosis not present

## 2020-02-10 DIAGNOSIS — Z7951 Long term (current) use of inhaled steroids: Secondary | ICD-10-CM | POA: Diagnosis not present

## 2020-02-10 LAB — BASIC METABOLIC PANEL
Anion gap: 11 (ref 5–15)
BUN: 20 mg/dL (ref 8–23)
CO2: 24 mmol/L (ref 22–32)
Calcium: 8.9 mg/dL (ref 8.9–10.3)
Chloride: 105 mmol/L (ref 98–111)
Creatinine, Ser: 1.24 mg/dL (ref 0.61–1.24)
GFR calc Af Amer: >60 mL/min (ref >60)
GFR calc non Af Amer: >60 mL/min (ref >60)
Glucose, Bld: 127 mg/dL — ABNORMAL HIGH (ref 70–99)
Potassium: 3.9 mmol/L (ref 3.5–5.1)
Sodium: 140 mmol/L (ref 135–145)

## 2020-02-10 NOTE — Progress Notes (Signed)
Patient ID: Kevin [name], male    DOB: 03-16-1956, 64 y.o.   MRN: [id number]  HPI  Kevin [name] is a 64 y/o male with a history of HTN, COPD, tobacco use and chronic heart failure.   Echo report from 01/13/20 reviewed and showed an EF of 50-55% along with moderate LVH. Echo report from 08/04/2019 reviewed and showed an EF of 25-30% along with mild/moderate TR.   Has not been admitted or been in the ED in the last 6 months.    He presents today with a chief complaint of a follow-up visit. He currently doesn't have any symptoms and specifically denies any difficulty sleeping, dizziness, abdominal distention, palpitations, pedal edema, chest pain, shortness of breath, cough, fatigue or weight gain. Overall, he says that he feels great.   Past Medical History:  Diagnosis Date  . Chronic combined systolic (congestive) and diastolic (congestive) heart failure ([place])    a. 2019 reported neg stress test and nl cors on cath in [facility]; b. 07/2019 Echo: EF 25-30%, Gr2 DD, glob HK. Mod reduced RV fxn w/ volume overload. Mod dil RA. Mild to mod TR. Mod elev PASP.  Marland Kitchen COPD (chronic obstructive pulmonary disease) ([place])   . Diabetes mellitus without complication ([place])   . Hypertension   . Noncompliance   . Pleural effusion on right    a. 07/2019 Thoracentesis: 300 ml.    Social History   Tobacco Use  . Smoking status: Current Some Day Smoker    Packs/day: 0.25    Years: 20.00    Pack years: 5.00  . Smokeless tobacco: Never Used  . Tobacco comment: a pack lasts a week (appx. 4 days)  Substance Use Topics  . Alcohol use: Never   No Known Allergies  Prior to Admission medications   Medication Sig Start Date End Date Taking? Authorizing Provider  albuterol (VENTOLIN HFA) 108 (90 Base) MCG/ACT inhaler Inhale 2 puffs into the lungs every 6 (six) hours as needed for wheezing or shortness of breath. 09/08/19  Yes Zyan Mirkin, Otila Kluver A, FNP  amLODipine (NORVASC) 5 MG tablet Take 1 tablet (5 mg total) by mouth  daily. 01/14/20 04/13/20 Yes Agbor-Etang, Aaron Edelman, MD  Blood Pressure KIT 1 kit by Does not apply route daily. 12/23/19  Yes Iloabachie, Chioma E, NP  carvedilol (COREG) 25 MG tablet Take 1 tablet (25 mg total) by mouth 2 (two) times daily. 11/18/19  Yes Layton Naves A, FNP  fluticasone furoate-vilanterol (BREO ELLIPTA) 200-25 MCG/INH AEPB Inhale 1 puff into the lungs daily. 09/08/19  Yes Darylene Price A, FNP  furosemide (LASIX) 40 MG tablet Take 1 tablet (40 mg total) by mouth daily. 01/14/20  Yes Agbor-Etang, Aaron Edelman, MD  gabapentin (NEURONTIN) 300 MG capsule Take 300 mg by mouth 2 (two) times daily.   Yes [provider]  pravastatin (PRAVACHOL) 20 MG tablet Take 20 mg by mouth daily.   Yes [provider]  sacubitril-valsartan (ENTRESTO) 97-103 MG Take 1 tablet by mouth 2 (two) times daily. 11/11/19  Yes Darylene Price A, FNP  spironolactone (ALDACTONE) 25 MG tablet Take 1 tablet (25 mg total) by mouth daily. 11/18/19  Yes Darylene Price A, FNP  tiotropium (SPIRIVA) 18 MCG inhalation capsule Place 1 capsule (18 mcg total) into inhaler and inhale daily. 12/23/19  Yes Iloabachie, Chioma E, NP     Review of Systems  Constitutional: Negative for appetite change and fatigue.  HENT: Negative for congestion, postnasal drip and sore throat.   Eyes: Negative.  Respiratory: Negative for cough, chest tightness and shortness of breath.   Cardiovascular: Negative for chest pain, palpitations and leg swelling.  Gastrointestinal: Negative for abdominal distention and abdominal pain.  Endocrine: Negative.   Genitourinary: Negative.   Musculoskeletal: Negative for back pain and neck pain.  Skin: Negative.   Allergic/Immunologic: Negative.   Neurological: Negative for dizziness and light-headedness.  Hematological: Negative for adenopathy. Does not bruise/bleed easily.  Psychiatric/Behavioral: Negative for dysphoric mood and sleep disturbance (sleeping on 1 pillows). The patient is not  nervous/anxious.     Vitals:   02/10/20 1238  BP: 114/79  Pulse: 66  Resp: 18  SpO2: 100%  Weight: 195 lb (88.5 kg)  Height: '5\' 8"'  (1.727 m)   Wt Readings from Last 3 Encounters:  02/10/20 195 lb (88.5 kg)  01/14/20 198 lb (89.8 kg)  12/31/19 201 lb (91.2 kg)   Lab Results  Component Value Date   CREATININE 1.05 09/09/2019   CREATININE 0.95 08/09/2019   CREATININE 1.15 08/08/2019    Physical Exam Vitals and nursing note reviewed.  Constitutional:      Appearance: Normal appearance.  HENT:     Head: Normocephalic and atraumatic.  Neck:     Vascular: No carotid bruit.  Cardiovascular:     Rate and Rhythm: Normal rate and regular rhythm.  Pulmonary:     Effort: Pulmonary effort is normal. No respiratory distress.     Breath sounds: No wheezing or rales.  Abdominal:     General: Abdomen is flat. There is no distension.     Palpations: Abdomen is soft.  Musculoskeletal:        General: No tenderness.     Cervical back: Normal range of motion and neck supple.     Right lower leg: No tenderness. No edema.     Left lower leg: No tenderness. No edema.  Skin:    General: Skin is warm and dry.  Neurological:     General: No focal deficit present.     Mental Status: He is alert and oriented to person, place, and time.  Psychiatric:        Mood and Affect: Mood normal.        Behavior: Behavior normal.    Assessment & Plan:  1: Chronic heart failure with now preserved ejection fraction along with structural changes- - NYHA class I - euvolemic today - weighing daily and reinforced to call for an overnight weight gain of >2 pounds or a weekly weight gain of >5 pounds - weight down 6 pounds from last visit here 1 month ago - EF has improved - not adding salt and has been trying to read food labels; reviewed the importance of closely following a 2033m sodium diet  - will check BMP today due to entresto titration at last visit - saw cardiology (Agbor-Etang) 01/14/20 -  participating in paramedicine program - BNP 08/03/2019 was 2085.0 - patient says that he's received both COVID vaccines  2: HTN- - BP looks good today - [facility] - BMP on 09/09/19 reviewed and showed sodium 143, potassium 3.7, creatinine 1.05 and GFR 88  3: COPD-  - using nebulizer and inhalers - smoking 4 cigarettes daily   Patient had brought his medications but forgot and left them on the [facility]. NT in the office called [facility] who said they will have the same driver that dropped him off, return to pick him up so as to give him his medications.  Return here in 2 months or sooner for any questions/problems before then.

## 2020-02-10 NOTE — Patient Instructions (Addendum)
Continue weighing daily and call for an overnight weight gain of > 2 pounds or a weekly weight gain of >5 pounds.  [facility]  [address] [place], [place] [address]  July 1st at 2pm

## 2020-02-15 ENCOUNTER — Emergency Department
Admission: EM | Admit: 2020-02-15 | Discharge: 2020-02-15 | Disposition: A | Discharge status: Home / Self Care | Payer: Self-pay | Attending: Student in an Organized Health Care Education/Training Program | Admitting: Student in an Organized Health Care Education/Training Program

## 2020-02-15 ENCOUNTER — Other Ambulatory Visit: Payer: Self-pay

## 2020-02-15 ENCOUNTER — Encounter: Payer: Self-pay | Admitting: Emergency Medicine

## 2020-02-15 ENCOUNTER — Emergency Department: Payer: Self-pay

## 2020-02-15 DIAGNOSIS — F17210 Nicotine dependence, cigarettes, uncomplicated: Secondary | ICD-10-CM | POA: Insufficient documentation

## 2020-02-15 DIAGNOSIS — Z79899 Other long term (current) drug therapy: Secondary | ICD-10-CM | POA: Insufficient documentation

## 2020-02-15 DIAGNOSIS — L03113 Cellulitis of right upper limb: Secondary | ICD-10-CM

## 2020-02-15 DIAGNOSIS — Z7984 Long term (current) use of oral hypoglycemic drugs: Secondary | ICD-10-CM | POA: Insufficient documentation

## 2020-02-15 DIAGNOSIS — E119 Type 2 diabetes mellitus without complications: Secondary | ICD-10-CM | POA: Insufficient documentation

## 2020-02-15 DIAGNOSIS — J449 Chronic obstructive pulmonary disease, unspecified: Secondary | ICD-10-CM | POA: Insufficient documentation

## 2020-02-15 DIAGNOSIS — I110 Hypertensive heart disease with heart failure: Secondary | ICD-10-CM | POA: Insufficient documentation

## 2020-02-15 DIAGNOSIS — I5042 Chronic combined systolic (congestive) and diastolic (congestive) heart failure: Secondary | ICD-10-CM | POA: Insufficient documentation

## 2020-02-15 MED ORDER — CLINDAMYCIN HCL 150 MG PO CAPS
150.0000 mg | ORAL_CAPSULE | Freq: Four times a day (QID) | ORAL | 0 refills | Status: AC
Start: 2020-02-15 — End: 2020-02-25

## 2020-02-15 MED ORDER — CLINDAMYCIN PHOSPHATE 600 MG/50ML IV SOLN
600.0000 mg | Freq: Once | INTRAVENOUS | Status: AC
  Administered 2020-02-15: 600 mg via INTRAVENOUS
  Filled 2020-02-15: qty 50

## 2020-02-15 MED ORDER — KETOROLAC TROMETHAMINE 30 MG/ML IJ SOLN
30.0000 mg | Freq: Once | INTRAMUSCULAR | Status: AC
  Administered 2020-02-15: 30 mg via INTRAVENOUS
  Filled 2020-02-15: qty 1

## 2020-02-15 MED ORDER — NAPROXEN 500 MG PO TABS
500.0000 mg | ORAL_TABLET | Freq: Two times a day (BID) | ORAL | 0 refills | Status: DC
Start: 2020-02-15 — End: 2020-05-04

## 2020-02-15 NOTE — ED Triage Notes (Signed)
Patient states he was washing dishes over a week ago and got a small cut on his hand. Now has some swelling and redness noted to area and hand. Denies fevers at home.

## 2020-02-15 NOTE — ED Provider Notes (Signed)
[facility] Emergency Department Provider Note   ____________________________________________   None    (approximate)  I have reviewed the triage vital signs and the nursing notes.   HISTORY  Chief Complaint Wound Infection    HPI Kevin [name] is a 64 y.o. male patient complain of pain, edema, redness to the right hand.  Patient states washing dishes over week ago and got a puncture wound from a fork.  Patient is a complaint has worsened in the past 2 days.  Patient denies loss of sensation but states decreased range of motion with "making a fist".  Patient rates pain as a 10/10.  Patient back pain is "achy".  Patient denies fever or drainage.        Diabetes hypertension Past Medical History:  Diagnosis Date   Chronic combined systolic (congestive) and diastolic (congestive) heart failure ([place])    a. 2019 reported neg stress test and nl cors on cath in [facility]; b. 07/2019 Echo: EF 25-30%, Gr2 DD, glob HK. Mod reduced RV fxn w/ volume overload. Mod dil RA. Mild to mod TR. Mod elev PASP.   COPD (chronic obstructive pulmonary disease) (HCC)    Diabetes mellitus without complication (HCC)    Hypertension    Noncompliance    Pleural effusion on right    a. 07/2019 Thoracentesis: 300 ml.    Patient Active Problem List   Diagnosis Date Noted   Elevated lipids 12/23/2019   Essential hypertension 12/07/2019   Encounter to establish care 12/07/2019   History of COPD 12/07/2019   Type 2 diabetes, controlled, with neuropathy (HCC)    Pleural effusion on right    Hypotension    Nonsustained ventricular tachycardia (HCC)    Anasarca    Acute on chronic congestive heart failure (HCC)    Elevated LFTs    Lower extremity edema    COPD with acute exacerbation (HCC)    Neuropathy    Hypertensive urgency 08/03/2019    History reviewed. No pertinent surgical history.  Prior to Admission medications   Medication Sig Start Date End  Date Taking? Authorizing Provider  albuterol (VENTOLIN HFA) 108 (90 Base) MCG/ACT inhaler Inhale 2 puffs into the lungs every 6 (six) hours as needed for wheezing or shortness of breath. 09/08/19   Alisa Graff, FNP  amLODipine (NORVASC) 5 MG tablet Take 1 tablet (5 mg total) by mouth daily. 01/14/20 04/13/20  Kate Sable, MD  Blood Pressure KIT 1 kit by Does not apply route daily. 12/23/19   Iloabachie, Chioma E, NP  carvedilol (COREG) 25 MG tablet Take 1 tablet (25 mg total) by mouth 2 (two) times daily. 11/18/19   Alisa Graff, FNP  clindamycin (CLEOCIN) 150 MG capsule Take 1 capsule (150 mg total) by mouth 4 (four) times daily for 10 days. 02/15/20 02/25/20  Sable Feil, PA-C  fluticasone furoate-vilanterol (BREO ELLIPTA) 200-25 MCG/INH AEPB Inhale 1 puff into the lungs daily. 09/08/19   Alisa Graff, FNP  furosemide (LASIX) 40 MG tablet Take 1 tablet (40 mg total) by mouth daily. 01/14/20   Kate Sable, MD  gabapentin (NEURONTIN) 300 MG capsule Take 300 mg by mouth 2 (two) times daily.    [provider]  naproxen (NAPROSYN) 500 MG tablet Take 1 tablet (500 mg total) by mouth 2 (two) times daily with a meal. 02/15/20   Sable Feil, PA-C  pravastatin (PRAVACHOL) 20 MG tablet Take 20 mg by mouth daily.    [provider]  sacubitril-valsartan (ENTRESTO) 97-103 MG Take 1 tablet by mouth 2 (two) times daily. 11/11/19   Alisa Graff, FNP  spironolactone (ALDACTONE) 25 MG tablet Take 1 tablet (25 mg total) by mouth daily. 11/18/19   Alisa Graff, FNP  tiotropium (SPIRIVA) 18 MCG inhalation capsule Place 1 capsule (18 mcg total) into inhaler and inhale daily. 12/23/19   Iloabachie, Chioma E, NP    Allergies Patient has no known allergies.  No family history on file.  Social History Social History   Tobacco Use   Smoking status: Current Some Day Smoker    Packs/day: 0.25    Years: 20.00    Pack years: 5.00   Smokeless tobacco: Never Used    Tobacco comment: a pack lasts a week (appx. 4 days)  Vaping Use   Vaping Use: Never used  Substance Use Topics   Alcohol use: Never   Drug use: Not Currently    Review of Systems  Constitutional: No fever/chills Eyes: No visual changes. ENT: No sore throat. Cardiovascular: Denies chest pain. Respiratory: Denies shortness of breath. Gastrointestinal: No abdominal pain.  No nausea, no vomiting.  No diarrhea.  No constipation. Genitourinary: Negative for dysuria. Musculoskeletal: Negative for back pain. Skin: Negative for rash. Neurological: Negative for headaches, focal weakness or numbness. Endocrine:  Diabetes and hypertension ____________________________________________   PHYSICAL EXAM:  VITAL SIGNS: ED Triage Vitals  Enc Vitals Group     BP 02/15/20 1158 (!) 149/98     Pulse Rate 02/15/20 1158 60     Resp 02/15/20 1158 18     Temp 02/15/20 1158 97.7 F (36.5 C)     Temp Source 02/15/20 1158 Oral     SpO2 02/15/20 1158 100 %     Weight 02/15/20 1202 194 lb 0.1 oz (88 kg)     Height 02/15/20 1202 '5\' 8"'  (1.727 m)     Head Circumference --      Peak Flow --      Pain Score 02/15/20 1202 10     Pain Loc --      Pain Edu? --      Excl. in [place]? --     Constitutional: Alert and oriented. Well appearing and in no acute distress. Hematological/Lymphatic/Immunilogical: No cervical lymphadenopathy. Cardiovascular: Normal rate, regular rhythm. Grossly normal heart sounds.  Good peripheral circulation. Respiratory: Normal respiratory effort.  No retractions. Lungs CTAB. Neurologic:  Normal speech and language. No gross focal neurologic deficits are appreciated. No gait instability. Skin: Edema and erythema to the palmar aspect of the right hand.   ____________________________________________   LABS (all labs ordered are listed, but only abnormal results are displayed)  Labs Reviewed  BASIC METABOLIC PANEL  CBC WITH DIFFERENTIAL/PLATELET    ____________________________________________  EKG   ____________________________________________  RADIOLOGY  ED MD interpretation:    Official radiology report(s): DG Hand Complete Right  Result Date: 02/15/2020 CLINICAL DATA:  Right hand pain and swelling after puncture wound last week. EXAM: RIGHT HAND - COMPLETE 3+ VIEW COMPARISON:  None. FINDINGS: There is no evidence of fracture or dislocation. There is no evidence of arthropathy or other focal bone abnormality. Soft tissues are unremarkable. No radiopaque foreign body is noted. IMPRESSION: Negative. Electronically Signed   By: Marijo Conception M.D.   On: 02/15/2020 13:40    ____________________________________________   PROCEDURES  Procedure(s) performed (including Critical Care):  Procedures   ____________________________________________   INITIAL IMPRESSION / ASSESSMENT AND PLAN / ED COURSE  As part of my medical  decision making, I reviewed the following data within the [place]     Patient presents with mild pain, edema, and erythema to the palm of the right hand secondary to a puncture wound approximately a week ago.  Discussed negative x-ray findings with patient.  Patient started on IV clindamycin and will be switched to oral medication.  Advised to follow-up with PCP in 1 week.  Return to ED if condition worsens.          ____________________________________________   FINAL CLINICAL IMPRESSION(S) / ED DIAGNOSES  Final diagnoses:  Cellulitis of right hand     ED Discharge Orders         Ordered    clindamycin (CLEOCIN) 150 MG capsule  4 times daily     Discontinue  Reprint     02/15/20 1504    naproxen (NAPROSYN) 500 MG tablet  2 times daily with meals     Discontinue  Reprint     02/15/20 1504           Note:  This document was prepared using Dragon voice recognition software and may include unintentional dictation errors.    Sable Feil, PA-C 02/15/20 1509     Merlyn Lot, MD 02/15/20 [phone]

## 2020-02-15 NOTE — Discharge Instructions (Addendum)
Follow discharge care instruction take medication as directed.  Wear Ace wrap and arm sling while awake.  Do not sleep in sling or Ace wrap.

## 2020-02-15 NOTE — ED Notes (Signed)
Ace wrap applied and shoulder immobolizer.

## 2020-02-24 ENCOUNTER — Encounter: Payer: Self-pay | Admitting: Gerontology

## 2020-02-24 ENCOUNTER — Other Ambulatory Visit: Payer: Self-pay

## 2020-02-24 ENCOUNTER — Other Ambulatory Visit: Payer: Self-pay | Admitting: Gerontology

## 2020-02-24 ENCOUNTER — Ambulatory Visit: Payer: Medicaid Other | Admitting: Gerontology

## 2020-02-24 VITALS — BP 124/85 | HR 63 | Ht 68.0 in | Wt 194.0 lb

## 2020-02-24 DIAGNOSIS — E785 Hyperlipidemia, unspecified: Secondary | ICD-10-CM

## 2020-02-24 DIAGNOSIS — J441 Chronic obstructive pulmonary disease with (acute) exacerbation: Secondary | ICD-10-CM

## 2020-02-24 DIAGNOSIS — I10 Essential (primary) hypertension: Secondary | ICD-10-CM

## 2020-02-24 DIAGNOSIS — E1140 Type 2 diabetes mellitus with diabetic neuropathy, unspecified: Secondary | ICD-10-CM

## 2020-02-24 MED ORDER — PRAVASTATIN SODIUM 20 MG PO TABS: 20.0000 mg | ORAL_TABLET | Freq: Every day | ORAL | 2 refills | Status: DC

## 2020-02-24 NOTE — Progress Notes (Signed)
Established Patient Office Visit  Subjective:  Patient ID: Kevin [name], male    DOB: [birth year]  Age: 64 y.o. MRN: [id number]  CC:  Chief Complaint  Patient presents with  . Hypertension    HPI Kevin [name] is a 64 y.o. male who presents to the clinic today for a follow up of hypertension. He reports being compliant with his treatment regimen and states that he checks his blood pressure every other day. He verbalized that his blood pressure readings range between 130/85 to about 120/80. He states he has cut down on his cigarettes smoking. He currently smokes about 3 cigarettes per day and plans on quitting. He states that his breathing is stable and has not been using his Spiriva and Breo, but denies any COPD flare. He denies consuming alcohol. He was seen at the ED 02/15/2020 for cellulitis of his right hand. He was started on Clindamycin and finally switched to oral antibiotics. His most recent HgbA1C completed 12/15/2019 was recorded to be 5.9%. His lipid panel showed a cholesterol of 213 mg/dL and his LDL was 158 mg/dL. Overall, he states that he is doing well and offers no further complaint.   Past Medical History:  Diagnosis Date  . Chronic combined systolic (congestive) and diastolic (congestive) heart failure ([place])    a. 2019 reported neg stress test and nl cors on cath in [facility]; b. 07/2019 Echo: EF 25-30%, Gr2 DD, glob HK. Mod reduced RV fxn w/ volume overload. Mod dil RA. Mild to mod TR. Mod elev PASP.  Marland Kitchen COPD (chronic obstructive pulmonary disease) ([place])   . Diabetes mellitus without complication ([place])   . Hypertension   . Noncompliance   . Pleural effusion on right    a. 07/2019 Thoracentesis: 300 ml.    No past surgical history on file.  No family history on file.  Social History   Socioeconomic History  . Marital status: Single    Spouse name: Not on file  . Number of children: Not on file  . Years of education: Not on file  . Highest education level: Not on  file  Occupational History  . Not on file  Tobacco Use  . Smoking status: Current Some Day Smoker    Packs/day: 0.25    Years: 20.00    Pack years: 5.00  . Smokeless tobacco: Never Used  . Tobacco comment: a pack lasts a week (appx. 4 days)  Vaping Use  . Vaping Use: Never used  Substance and Sexual Activity  . Alcohol use: Never  . Drug use: Not Currently  . Sexual activity: Not Currently  Other Topics Concern  . Not on file  Social History Narrative   Patient moved to [place] from [place] in March of this year, to take care of his Mother. She lives in a community managed by her church, "independent living for seniors." Patient lives with his Mother, does not drive, and reports he mostly keeps to himself.   Social Determinants of Health   Financial Resource Strain: Medium Risk  . Difficulty of Paying Living Expenses: Somewhat hard  Food Insecurity: Food Insecurity Present  . Worried About Charity fundraiser in the Last Year: Sometimes true  . Ran Out of Food in the Last Year: Sometimes true  Transportation Needs: Unmet Transportation Needs  . Lack of Transportation (Medical): Yes  . Lack of Transportation (Non-Medical): Yes  Physical Activity: Insufficiently Active  . Days of Exercise per Week: 2 days  . Minutes of  Exercise per Session: 30 min  Stress: No Stress Concern Present  . Feeling of Stress : Not at all  Social Connections: Moderately Isolated  . Frequency of Communication with Friends and Family: Once a week  . Frequency of Social Gatherings with Friends and Family: Once a week  . Attends Religious Services: 1 to 4 times per year  . Active Member of Clubs or Organizations: Yes  . Attends Archivist Meetings: Never  . Marital Status: Never married  Intimate Partner Violence: Not At Risk  . Fear of Current or Ex-Partner: No  . Emotionally Abused: No  . Physically Abused: No  . Sexually Abused: No    Outpatient Medications Prior to Visit   Medication Sig Dispense Refill  . albuterol (VENTOLIN HFA) 108 (90 Base) MCG/ACT inhaler Inhale 2 puffs into the lungs every 6 (six) hours as needed for wheezing or shortness of breath. 18 g 3  . amLODipine (NORVASC) 5 MG tablet Take 1 tablet (5 mg total) by mouth daily. 90 tablet 1  . carvedilol (COREG) 25 MG tablet Take 1 tablet (25 mg total) by mouth 2 (two) times daily. 60 tablet 3  . clindamycin (CLEOCIN) 150 MG capsule Take 1 capsule (150 mg total) by mouth 4 (four) times daily for 10 days. 40 capsule 0  . furosemide (LASIX) 40 MG tablet Take 1 tablet (40 mg total) by mouth daily. 90 tablet 1  . gabapentin (NEURONTIN) 300 MG capsule Take 300 mg by mouth 2 (two) times daily.    . naproxen (NAPROSYN) 500 MG tablet Take 1 tablet (500 mg total) by mouth 2 (two) times daily with a meal. 20 tablet 0  . sacubitril-valsartan (ENTRESTO) 97-103 MG Take 1 tablet by mouth 2 (two) times daily. 180 tablet 3  . spironolactone (ALDACTONE) 25 MG tablet Take 1 tablet (25 mg total) by mouth daily. 30 tablet 5  . Blood Pressure KIT 1 kit by Does not apply route daily. 1 kit 0  . fluticasone furoate-vilanterol (BREO ELLIPTA) 200-25 MCG/INH AEPB Inhale 1 puff into the lungs daily. (Patient not taking: Reported on 02/24/2020) 60 each 3  . tiotropium (SPIRIVA) 18 MCG inhalation capsule Place 1 capsule (18 mcg total) into inhaler and inhale daily. (Patient not taking: Reported on 02/24/2020) 30 capsule 3  . pravastatin (PRAVACHOL) 20 MG tablet Take 20 mg by mouth daily. (Patient not taking: Reported on 02/24/2020)     No facility-administered medications prior to visit.    No Known Allergies  ROS Review of Systems  Constitutional: Negative.   Respiratory: Negative.   Cardiovascular: Negative.   Gastrointestinal: Negative.   Genitourinary: Negative.   Neurological: Negative.   Psychiatric/Behavioral: Negative.       Objective:    Physical Exam Constitutional:      Appearance: Normal appearance.  HENT:      Head: Normocephalic and atraumatic.  Cardiovascular:     Rate and Rhythm: Normal rate and regular rhythm.     Pulses: Normal pulses.     Heart sounds: Normal heart sounds.  Pulmonary:     Effort: Pulmonary effort is normal.     Breath sounds: Normal breath sounds.  Musculoskeletal:        General: Normal range of motion.  Neurological:     General: No focal deficit present.     Mental Status: He is alert and oriented to person, place, and time.  Psychiatric:        Mood and Affect: Mood normal.  Behavior: Behavior normal.        Thought Content: Thought content normal.        Judgment: Judgment normal.     BP 124/85 (BP Location: Left Arm, Patient Position: Sitting)   Pulse 63   Ht '5\' 8"'  (1.727 m)   Wt 194 lb (88 kg)   SpO2 99%   BMI 29.50 kg/m  Wt Readings from Last 3 Encounters:  02/24/20 194 lb (88 kg)  02/15/20 194 lb 0.1 oz (88 kg)  02/10/20 195 lb (88.5 kg)     Health Maintenance Due  Topic Date Due  . Hepatitis C Screening  Never done  . PNEUMOCOCCAL POLYSACCHARIDE VACCINE AGE 58-64 HIGH RISK  Never done  . FOOT EXAM  Never done  . OPHTHALMOLOGY EXAM  Never done  . URINE MICROALBUMIN  Never done  . TETANUS/TDAP  Never done  . COLONOSCOPY  Never done    There are no preventive care reminders to display for this patient.  No results found for: TSH Lab Results  Component Value Date   WBC 8.7 08/04/2019   HGB 10.9 (L) 08/04/2019   HCT 35.3 (L) 08/04/2019   MCV 90.3 08/04/2019   PLT 314 08/04/2019   Lab Results  Component Value Date   NA 140 02/10/2020   K 3.9 02/10/2020   CO2 24 02/10/2020   GLUCOSE 127 (H) 02/10/2020   BUN 20 02/10/2020   CREATININE 1.24 02/10/2020   BILITOT 1.0 08/06/2019   ALKPHOS 158 (H) 08/06/2019   AST 27 08/06/2019   ALT 40 08/06/2019   PROT 6.8 08/06/2019   ALBUMIN 2.9 (L) 08/06/2019   CALCIUM 8.9 02/10/2020   ANIONGAP 11 02/10/2020   Lab Results  Component Value Date   CHOL 213 (H) 12/15/2019   Lab  Results  Component Value Date   HDL 42 12/15/2019   Lab Results  Component Value Date   LDLCALC 158 (H) 12/15/2019   Lab Results  Component Value Date   TRIG 72 12/15/2019   Lab Results  Component Value Date   CHOLHDL 5.1 (H) 12/15/2019   Lab Results  Component Value Date   HGBA1C 5.9 (H) 12/15/2019      Assessment & Plan:   1. Essential hypertension His hypertension is under control. He will continue current treatment regimen.  He advised to continue on DASH diet and exercise as tolerated.  2. COPD with acute exacerbation ([place]) His COPD is under control. He will continue current treatment regimen. He was advised to pick up his Breo from Pharmacy   3. Elevated lipids He was advised to decrease intake of low fat/cholesterol diet - pravastatin (PRAVACHOL) 20 MG tablet; Take 1 tablet (20 mg total) by mouth daily.  Dispense: 30 tablet; Refill: 2 - Lipid panel; Future  4. Type 2 diabetes, controlled, with neuropathy ([place]) He advised decrease intake of concentrated sweets - HgB A1c; Future      Follow-up: Return in about 5 weeks (around 03/30/2020), or if symptoms worsen or fail to improve.    Carney Corners, RN

## 2020-02-24 NOTE — Patient Instructions (Signed)
Fat and Cholesterol Restricted Eating Plan Getting too much fat and cholesterol in your diet may cause health problems. Choosing the right foods helps keep your fat and cholesterol at normal levels. This can keep you from getting certain diseases. Your doctor may recommend an eating plan that includes:  Total fat: ______% or less of total calories a day.  Saturated fat: ______% or less of total calories a day.  Cholesterol: less than _________mg a day.  Fiber: ______g a day. What are tips for following this plan? Meal planning  At meals, divide your plate into four equal parts: ? Fill one-half of your plate with vegetables and green salads. ? Fill one-fourth of your plate with whole grains. ? Fill one-fourth of your plate with low-fat (lean) protein foods.  Eat fish that is high in omega-3 fats at least two times a week. This includes mackerel, tuna, sardines, and salmon.  Eat foods that are high in fiber, such as whole grains, beans, apples, broccoli, carrots, peas, and barley. General tips   Work with your doctor to lose weight if you need to.  Avoid: ? Foods with added sugar. ? Fried foods. ? Foods with partially hydrogenated oils.  Limit alcohol intake to no more than 1 drink a day for nonpregnant women and 2 drinks a day for men. One drink equals 12 oz of beer, 5 oz of wine, or 1 oz of hard liquor. Reading food labels  Check food labels for: ? Trans fats. ? Partially hydrogenated oils. ? Saturated fat (g) in each serving. ? Cholesterol (mg) in each serving. ? Fiber (g) in each serving.  Choose foods with healthy fats, such as: ? Monounsaturated fats. ? Polyunsaturated fats. ? Omega-3 fats.  Choose grain products that have whole grains. Look for the word "whole" as the first word in the ingredient list. Cooking  Cook foods using low-fat methods. These include baking, boiling, grilling, and broiling.  Eat more home-cooked foods. Eat at restaurants and buffets  less often.  Avoid cooking using saturated fats, such as butter, cream, palm oil, palm kernel oil, and coconut oil. Recommended foods  Fruits  All fresh, canned (in natural juice), or frozen fruits. Vegetables  Fresh or frozen vegetables (raw, steamed, roasted, or grilled). Green salads. Grains  Whole grains, such as whole wheat or whole grain breads, crackers, cereals, and pasta. Unsweetened oatmeal, bulgur, barley, quinoa, or brown rice. Corn or whole wheat flour tortillas. Meats and other protein foods  Ground beef (85% or leaner), grass-fed beef, or beef trimmed of fat. Skinless chicken or turkey. Ground chicken or turkey. Pork trimmed of fat. All fish and seafood. Egg whites. Dried beans, peas, or lentils. Unsalted nuts or seeds. Unsalted canned beans. Nut butters without added sugar or oil. Dairy  Low-fat or nonfat dairy products, such as skim or 1% milk, 2% or reduced-fat cheeses, low-fat and fat-free ricotta or cottage cheese, or plain low-fat and nonfat yogurt. Fats and oils  Tub margarine without trans fats. Light or reduced-fat mayonnaise and salad dressings. Avocado. Olive, canola, sesame, or safflower oils. The items listed above may not be a complete list of foods and beverages you can eat. Contact a dietitian for more information. Foods to avoid Fruits  Canned fruit in heavy syrup. Fruit in cream or butter sauce. Fried fruit. Vegetables  Vegetables cooked in cheese, cream, or butter sauce. Fried vegetables. Grains  White bread. White pasta. White rice. Cornbread. Bagels, pastries, and croissants. Crackers and snack foods that contain trans fat   and hydrogenated oils. Meats and other protein foods  Fatty cuts of meat. Ribs, chicken wings, bacon, sausage, bologna, salami, chitterlings, fatback, hot dogs, bratwurst, and packaged lunch meats. Liver and organ meats. Whole eggs and egg yolks. Chicken and turkey with skin. Fried meat. Dairy  Whole or 2% milk, cream,  half-and-half, and cream cheese. Whole milk cheeses. Whole-fat or sweetened yogurt. Full-fat cheeses. Nondairy creamers and whipped toppings. Processed cheese, cheese spreads, and cheese curds. Beverages  Alcohol. Sugar-sweetened drinks such as sodas, lemonade, and fruit drinks. Fats and oils  Butter, stick margarine, lard, shortening, ghee, or bacon fat. Coconut, palm kernel, and palm oils. Sweets and desserts  Corn syrup, sugars, honey, and molasses. Candy. Jam and jelly. Syrup. Sweetened cereals. Cookies, pies, cakes, donuts, muffins, and ice cream. The items listed above may not be a complete list of foods and beverages you should avoid. Contact a dietitian for more information. Summary  Choosing the right foods helps keep your fat and cholesterol at normal levels. This can keep you from getting certain diseases.  At meals, fill one-half of your plate with vegetables and green salads.  Eat high-fiber foods, like whole grains, beans, apples, carrots, peas, and barley.  Limit added sugar, saturated fats, alcohol, and fried foods. This information is not intended to replace advice given to you by your health care provider. Make sure you discuss any questions you have with your health care provider. Document Revised: 04/15/2018 Document Reviewed: 04/29/2017 Elsevier Patient Education  2020 [employer] Inc[employer] DASH Eating Plan DASH stands for "Dietary Approaches to Stop Hypertension." The DASH eating plan is a healthy eating plan that has been shown to reduce high blood pressure (hypertension). It may also reduce your risk for type 2 diabetes, heart disease, and stroke. The DASH eating plan may also help with weight loss. What are tips for following this plan?  General guidelines  Avoid eating more than 2,300 mg (milligrams) of salt (sodium) a day. If you have hypertension, you may need to reduce your sodium intake to 1,500 mg a day.  Limit alcohol intake to no more than 1 drink a day for  nonpregnant women and 2 drinks a day for men. One drink equals 12 oz of beer, 5 oz of wine, or 1 oz of hard liquor.  Work with your health care provider to maintain a healthy body weight or to lose weight. Ask what an ideal weight is for you.  Get at least 30 minutes of exercise that causes your heart to beat faster (aerobic exercise) most days of the week. Activities may include walking, swimming, or biking.  Work with your health care provider or diet and nutrition specialist (dietitian) to adjust your eating plan to your individual calorie needs. Reading food labels   Check food labels for the amount of sodium per serving. Choose foods with less than 5 percent of the Daily Value of sodium. Generally, foods with less than 300 mg of sodium per serving fit into this eating plan.  To find whole grains, look for the word "whole" as the first word in the ingredient list. Shopping  Buy products labeled as "low-sodium" or "no salt added."  Buy fresh foods. Avoid canned foods and premade or frozen meals. Cooking  Avoid adding salt when cooking. Use salt-free seasonings or herbs instead of table salt or sea salt. Check with your health care provider or pharmacist before using salt substitutes.  Do not fry foods. Cook foods using healthy methods such as baking, boiling, grilling,   and broiling instead.  Cook with heart-healthy oils, such as olive, canola, soybean, or sunflower oil. Meal planning  Eat a balanced diet that includes: ? 5 or more servings of fruits and vegetables each day. At each meal, try to fill half of your plate with fruits and vegetables. ? Up to 6-8 servings of whole grains each day. ? Less than 6 oz of lean meat, poultry, or fish each day. A 3-oz serving of meat is about the same size as a deck of cards. One egg equals 1 oz. ? 2 servings of low-fat dairy each day. ? A serving of nuts, seeds, or beans 5 times each week. ? Heart-healthy fats. Healthy fats called Omega-3  fatty acids are found in foods such as flaxseeds and coldwater fish, like sardines, salmon, and mackerel.  Limit how much you eat of the following: ? Canned or prepackaged foods. ? Food that is high in trans fat, such as fried foods. ? Food that is high in saturated fat, such as fatty meat. ? Sweets, desserts, sugary drinks, and other foods with added sugar. ? Full-fat dairy products.  Do not salt foods before eating.  Try to eat at least 2 vegetarian meals each week.  Eat more home-cooked food and less restaurant, buffet, and fast food.  When eating at a restaurant, ask that your food be prepared with less salt or no salt, if possible. What foods are recommended? The items listed may not be a complete list. Talk with your dietitian about what dietary choices are best for you. Grains Whole-grain or whole-wheat bread. Whole-grain or whole-wheat pasta. Brown rice. Oatmeal. Quinoa. Bulgur. Whole-grain and low-sodium cereals. Pita bread. Low-fat, low-sodium crackers. Whole-wheat flour tortillas. Vegetables Fresh or frozen vegetables (raw, steamed, roasted, or grilled). Low-sodium or reduced-sodium tomato and vegetable juice. Low-sodium or reduced-sodium tomato sauce and tomato paste. Low-sodium or reduced-sodium canned vegetables. Fruits All fresh, dried, or frozen fruit. Canned fruit in natural juice (without added sugar). Meat and other protein foods Skinless chicken or turkey. Ground chicken or turkey. Pork with fat trimmed off. Fish and seafood. Egg whites. Dried beans, peas, or lentils. Unsalted nuts, nut butters, and seeds. Unsalted canned beans. Lean cuts of beef with fat trimmed off. Low-sodium, lean deli meat. Dairy Low-fat (1%) or fat-free (skim) milk. Fat-free, low-fat, or reduced-fat cheeses. Nonfat, low-sodium ricotta or cottage cheese. Low-fat or nonfat yogurt. Low-fat, low-sodium cheese. Fats and oils Soft margarine without trans fats. Vegetable oil. Low-fat, reduced-fat, or  light mayonnaise and salad dressings (reduced-sodium). Canola, safflower, olive, soybean, and sunflower oils. Avocado. Seasoning and other foods Herbs. Spices. Seasoning mixes without salt. Unsalted popcorn and pretzels. Fat-free sweets. What foods are not recommended? The items listed may not be a complete list. Talk with your dietitian about what dietary choices are best for you. Grains Baked goods made with fat, such as croissants, muffins, or some breads. Dry pasta or rice meal packs. Vegetables Creamed or fried vegetables. Vegetables in a cheese sauce. Regular canned vegetables (not low-sodium or reduced-sodium). Regular canned tomato sauce and paste (not low-sodium or reduced-sodium). Regular tomato and vegetable juice (not low-sodium or reduced-sodium). Pickles. Olives. Fruits Canned fruit in a light or heavy syrup. Fried fruit. Fruit in cream or butter sauce. Meat and other protein foods Fatty cuts of meat. Ribs. Fried meat. Bacon. Sausage. Bologna and other processed lunch meats. Salami. Fatback. Hotdogs. Bratwurst. Salted nuts and seeds. Canned beans with added salt. Canned or smoked fish. Whole eggs or egg yolks. Chicken or turkey   with skin. Dairy Whole or 2% milk, cream, and half-and-half. Whole or full-fat cream cheese. Whole-fat or sweetened yogurt. Full-fat cheese. Nondairy creamers. Whipped toppings. Processed cheese and cheese spreads. Fats and oils Butter. Stick margarine. Lard. Shortening. Ghee. Bacon fat. Tropical oils, such as coconut, palm kernel, or palm oil. Seasoning and other foods Salted popcorn and pretzels. Onion salt, garlic salt, seasoned salt, table salt, and sea salt. Worcestershire sauce. Tartar sauce. Barbecue sauce. Teriyaki sauce. Soy sauce, including reduced-sodium. Steak sauce. Canned and packaged gravies. Fish sauce. Oyster sauce. Cocktail sauce. Horseradish that you find on the shelf. Ketchup. Mustard. Meat flavorings and tenderizers. Bouillon cubes. Hot  sauce and Tabasco sauce. Premade or packaged marinades. Premade or packaged taco seasonings. Relishes. Regular salad dressings. Where to find more information:  National Heart, Lung, and Blood Institute: www.nhlbi.nih.gov  American Heart Association: www.heart.org Summary  The DASH eating plan is a healthy eating plan that has been shown to reduce high blood pressure (hypertension). It may also reduce your risk for type 2 diabetes, heart disease, and stroke.  With the DASH eating plan, you should limit salt (sodium) intake to 2,300 mg a day. If you have hypertension, you may need to reduce your sodium intake to 1,500 mg a day.  When on the DASH eating plan, aim to eat more fresh fruits and vegetables, whole grains, lean proteins, low-fat dairy, and heart-healthy fats.  Work with your health care provider or diet and nutrition specialist (dietitian) to adjust your eating plan to your individual calorie needs. This information is not intended to replace advice given to you by your health care provider. Make sure you discuss any questions you have with your health care provider. Document Revised: 07/25/2017 Document Reviewed: 08/05/2016 Elsevier Patient Education  2020 Elsevier Inc.  

## 2020-02-25 ENCOUNTER — Ambulatory Visit: Payer: Self-pay | Admitting: Cardiology

## 2020-02-29 ENCOUNTER — Encounter: Payer: Self-pay | Admitting: Cardiology

## 2020-03-17 ENCOUNTER — Emergency Department
Admission: EM | Admit: 2020-03-17 | Discharge: 2020-03-17 | Disposition: A | Discharge status: Home / Self Care | Payer: Medicaid - Out of State | Attending: Emergency Medicine | Admitting: Emergency Medicine

## 2020-03-17 ENCOUNTER — Other Ambulatory Visit: Payer: Self-pay

## 2020-03-17 DIAGNOSIS — Z7951 Long term (current) use of inhaled steroids: Secondary | ICD-10-CM | POA: Insufficient documentation

## 2020-03-17 DIAGNOSIS — J441 Chronic obstructive pulmonary disease with (acute) exacerbation: Secondary | ICD-10-CM | POA: Insufficient documentation

## 2020-03-17 DIAGNOSIS — R112 Nausea with vomiting, unspecified: Secondary | ICD-10-CM

## 2020-03-17 DIAGNOSIS — E1140 Type 2 diabetes mellitus with diabetic neuropathy, unspecified: Secondary | ICD-10-CM | POA: Insufficient documentation

## 2020-03-17 DIAGNOSIS — I5042 Chronic combined systolic (congestive) and diastolic (congestive) heart failure: Secondary | ICD-10-CM | POA: Insufficient documentation

## 2020-03-17 DIAGNOSIS — I110 Hypertensive heart disease with heart failure: Secondary | ICD-10-CM | POA: Insufficient documentation

## 2020-03-17 DIAGNOSIS — F17200 Nicotine dependence, unspecified, uncomplicated: Secondary | ICD-10-CM | POA: Insufficient documentation

## 2020-03-17 DIAGNOSIS — Z79899 Other long term (current) drug therapy: Secondary | ICD-10-CM | POA: Insufficient documentation

## 2020-03-17 DIAGNOSIS — N289 Disorder of kidney and ureter, unspecified: Secondary | ICD-10-CM

## 2020-03-17 LAB — URINALYSIS, COMPLETE (UACMP) WITH MICROSCOPIC
Bacteria, UA: NONE SEEN
Bilirubin Urine: NEGATIVE
Glucose, UA: NEGATIVE mg/dL
Hgb urine dipstick: NEGATIVE
Ketones, ur: 5 mg/dL — AB
Leukocytes,Ua: NEGATIVE
Nitrite: NEGATIVE
Protein, ur: 30 mg/dL — AB
Specific Gravity, Urine: 1.028 (ref 1.005–1.030)
pH: 5 (ref 5.0–8.0)

## 2020-03-17 LAB — COMPREHENSIVE METABOLIC PANEL
ALT: 25 U/L (ref 0–44)
AST: 33 U/L (ref 15–41)
Albumin: 4.3 g/dL (ref 3.5–5.0)
Alkaline Phosphatase: 70 U/L (ref 38–126)
Anion gap: 9 (ref 5–15)
BUN: 22 mg/dL (ref 8–23)
CO2: 29 mmol/L (ref 22–32)
Calcium: 9.2 mg/dL (ref 8.9–10.3)
Chloride: 102 mmol/L (ref 98–111)
Creatinine, Ser: 2.19 mg/dL — ABNORMAL HIGH (ref 0.61–1.24)
GFR calc Af Amer: 36 mL/min — ABNORMAL LOW (ref >60)
GFR calc non Af Amer: 31 mL/min — ABNORMAL LOW (ref >60)
Glucose, Bld: 133 mg/dL — ABNORMAL HIGH (ref 70–99)
Potassium: 4.6 mmol/L (ref 3.5–5.1)
Sodium: 140 mmol/L (ref 135–145)
Total Bilirubin: 1.1 mg/dL (ref 0.3–1.2)
Total Protein: 8 g/dL (ref 6.5–8.1)

## 2020-03-17 LAB — CBC
HCT: 42.6 % (ref 39.0–52.0)
Hemoglobin: 14.6 g/dL (ref 13.0–17.0)
MCH: 32.9 pg (ref 26.0–34.0)
MCHC: 34.3 g/dL (ref 30.0–36.0)
MCV: 95.9 fL (ref 80.0–100.0)
Platelets: 251 10*3/uL (ref 150–400)
RBC: 4.44 MIL/uL (ref 4.22–5.81)
RDW: 13.5 % (ref 11.5–15.5)
WBC: 9.8 10*3/uL (ref 4.0–10.5)
nRBC: 0 % (ref 0.0–0.2)

## 2020-03-17 MED ORDER — SODIUM CHLORIDE 0.9 % IV BOLUS: 1000.0000 mL | Freq: Once | INTRAVENOUS | Status: DC

## 2020-03-17 MED ORDER — SODIUM CHLORIDE 0.9 % IV BOLUS
1000.0000 mL | Freq: Once | INTRAVENOUS | Status: AC
  Administered 2020-03-17: 1000 mL via INTRAVENOUS

## 2020-03-17 NOTE — ED Provider Notes (Signed)
[facility] Emergency Department Provider Note  Time seen: 8:06 AM  I have reviewed the triage vital signs and the nursing notes.   HISTORY  Chief Complaint Vomiting   HPI Kevin [name] is a 64 y.o. male with a past medical history of CHF, COPD, diabetes, hypertension, presents to the emergency department after an episode of vomiting.  According to the patient he vomited last night after eating a lot of fruit per patient.  Patient denies any abdominal pain, denies any chest pain or shortness of breath at any point.  Patient states his family wanted him to get checked out because he vomited.  Currently patient denies any nausea has had no further episodes of vomiting.  Denies any abdominal pain at any point.  Largely negative review of systems.  Patient appears well currently.   Past Medical History:  Diagnosis Date  . Chronic combined systolic (congestive) and diastolic (congestive) heart failure ([place])    a. 2019 reported neg stress test and nl cors on cath in [facility]; b. 07/2019 Echo: EF 25-30%, Gr2 DD, glob HK. Mod reduced RV fxn w/ volume overload. Mod dil RA. Mild to mod TR. Mod elev PASP.  Marland Kitchen COPD (chronic obstructive pulmonary disease) ([place])   . Diabetes mellitus without complication ([place])   . Hypertension   . Noncompliance   . Pleural effusion on right    a. 07/2019 Thoracentesis: 300 ml.    Patient Active Problem List   Diagnosis Date Noted  . Elevated lipids 12/23/2019  . Essential hypertension 12/07/2019  . Encounter to establish care 12/07/2019  . History of COPD 12/07/2019  . Type 2 diabetes, controlled, with neuropathy ([place])   . Pleural effusion on right   . Hypotension   . Nonsustained ventricular tachycardia ([place])   . Anasarca   . Acute on chronic congestive heart failure ([place])   . Elevated LFTs   . Lower extremity edema   . COPD with acute exacerbation ([place])   . Neuropathy   . Hypertensive urgency 08/03/2019    No past surgical  history on file.  Prior to Admission medications   Medication Sig Start Date End Date Taking? Authorizing Provider  albuterol (VENTOLIN HFA) 108 (90 Base) MCG/ACT inhaler Inhale 2 puffs into the lungs every 6 (six) hours as needed for wheezing or shortness of breath. 09/08/19   Alisa Graff, FNP  amLODipine (NORVASC) 5 MG tablet Take 1 tablet (5 mg total) by mouth daily. 01/14/20 04/13/20  Kate Sable, MD  Blood Pressure KIT 1 kit by Does not apply route daily. 12/23/19   Iloabachie, Chioma E, NP  carvedilol (COREG) 25 MG tablet Take 1 tablet (25 mg total) by mouth 2 (two) times daily. 11/18/19   Darylene Price A, FNP  fluticasone furoate-vilanterol (BREO ELLIPTA) 200-25 MCG/INH AEPB Inhale 1 puff into the lungs daily. Patient not taking: Reported on 02/24/2020 09/08/19   Alisa Graff, FNP  furosemide (LASIX) 40 MG tablet Take 1 tablet (40 mg total) by mouth daily. 01/14/20   Kate Sable, MD  gabapentin (NEURONTIN) 300 MG capsule Take 300 mg by mouth 2 (two) times daily.    [provider]  naproxen (NAPROSYN) 500 MG tablet Take 1 tablet (500 mg total) by mouth 2 (two) times daily with a meal. 02/15/20   Sable Feil, PA-C  pravastatin (PRAVACHOL) 20 MG tablet Take 1 tablet (20 mg total) by mouth daily. 02/24/20   Iloabachie, Chioma E, NP  sacubitril-valsartan (ENTRESTO) 97-103 MG Take  1 tablet by mouth 2 (two) times daily. 11/11/19   Alisa Graff, FNP  spironolactone (ALDACTONE) 25 MG tablet Take 1 tablet (25 mg total) by mouth daily. 11/18/19   Alisa Graff, FNP  tiotropium (SPIRIVA) 18 MCG inhalation capsule Place 1 capsule (18 mcg total) into inhaler and inhale daily. Patient not taking: Reported on 02/24/2020 12/23/19   Iloabachie, Chioma E, NP    No Known Allergies  No family history on file.  Social History Social History   Tobacco Use  . Smoking status: Current Some Day Smoker    Packs/day: 0.25    Years: 20.00    Pack years: 5.00  . Smokeless tobacco:  Never Used  . Tobacco comment: a pack lasts a week (appx. 4 days)  Vaping Use  . Vaping Use: Never used  Substance Use Topics  . Alcohol use: Never  . Drug use: Not Currently    Review of Systems Constitutional: Negative for fever. Cardiovascular: Negative for chest pain. Respiratory: Negative for shortness of breath. Gastrointestinal: Negative for abdominal pain.  One episode of vomiting last night.  No diarrhea. Genitourinary: Negative for urinary compaints Musculoskeletal: Negative for musculoskeletal complaints Neurological: Negative for headache All other ROS negative  ____________________________________________   PHYSICAL EXAM:  VITAL SIGNS: ED Triage Vitals  Enc Vitals Group     BP 03/17/20 0140 120/77     Pulse Rate 03/17/20 0140 54     Resp 03/17/20 0140 18     Temp 03/17/20 0144 97.7 F (36.5 C)     Temp Source 03/17/20 0144 Oral     SpO2 03/17/20 0140 99 %     Weight 03/17/20 0138 197 lb (89.4 kg)     Height 03/17/20 0138 '5\' 8"'  (1.727 m)     Head Circumference --      Peak Flow --      Pain Score 03/17/20 0138 0     Pain Loc --      Pain Edu? --      Excl. in [place]? --    Constitutional: Alert and oriented. Well appearing and in no distress. Eyes: Normal exam ENT      Head: Normocephalic and atraumatic.      Mouth/Throat: Mucous membranes are moist. Cardiovascular: Normal rate, regular rhythm.  Respiratory: Normal respiratory effort without tachypnea nor retractions. Breath sounds are clear Gastrointestinal: Soft and nontender. No distention.   Musculoskeletal: Nontender with normal range of motion in all extremities.  Neurologic:  Normal speech and language. No gross focal neurologic deficits Skin:  Skin is warm, dry and intact.  Psychiatric: Mood and affect are normal.     INITIAL IMPRESSION / ASSESSMENT AND PLAN / ED COURSE  Pertinent labs & imaging results that were available during my care of the patient were reviewed by me and considered in  my medical decision making (see chart for details).   Patient presents to the emergency department for an episode of vomiting last night.  Currently patient appears well has no nausea or abdominal pain.  No further episodes of vomiting.  Denies any fever cough or shortness of breath.  Reassuring vitals.  Patient's labs however do show an elevation in creatinine to 2.1.  Patient takes Lasix.  Possible overdiuresis.  Denies any trouble urinating denies any diarrhea, states he has been eating and drinking normally.  Has the patient otherwise appears well with reassuring vitals I do believe the patient would be safe for discharge home.  Given lab work consistent with  decrease in renal function we will IV hydrate with 1 L of normal saline.  I discussed with the patient slightly increasing his fluid intake and following up with his doctor Monday or Tuesday to recheck his labs.  Patient agreeable to plan of care.  Patient continues to appear quite well.  Will discharge with PCP follow-up Monday or Tuesday for recheck of his lab work.  Joy Haegele was evaluated in Emergency Department on 03/17/2020 for the symptoms described in the history of present illness. He was evaluated in the context of the global COVID-19 pandemic, which necessitated consideration that the patient might be at risk for infection with the SARS-CoV-2 virus that causes COVID-19. Institutional protocols and algorithms that pertain to the evaluation of patients at risk for COVID-19 are in a state of rapid change based on information released by regulatory bodies including the CDC and federal and state organizations. These policies and algorithms were followed during the patient's care in the ED.  ____________________________________________   FINAL CLINICAL IMPRESSION(S) / ED DIAGNOSES  Nausea vomiting Acute renal insufficiency   Harvest Dark, MD 03/17/20 0930

## 2020-03-17 NOTE — ED Notes (Signed)
Pt sitting in lobby, eyes closed; resp even/unlab with no distress noted

## 2020-03-17 NOTE — Discharge Instructions (Addendum)
As we discussed please increase your fluid intake over the weekend.  Please follow-up with your doctor Monday or Tuesday for recheck of your lab work.  Return to the emergency department for any further episodes of vomiting, or any other symptom personally concerning to yourself.

## 2020-03-17 NOTE — ED Triage Notes (Signed)
Pt in with co vomiting x 1, states not nauseated at this time. No co pain or other distress.

## 2020-03-21 ENCOUNTER — Other Ambulatory Visit: Payer: Self-pay | Admitting: Gerontology

## 2020-03-21 DIAGNOSIS — R899 Unspecified abnormal finding in specimens from other organs, systems and tissues: Secondary | ICD-10-CM

## 2020-03-21 NOTE — Progress Notes (Unsigned)
uri

## 2020-03-22 ENCOUNTER — Telehealth (HOSPITAL_COMMUNITY): Payer: Self-pay

## 2020-03-22 ENCOUNTER — Ambulatory Visit: Payer: Medicaid Other

## 2020-03-22 NOTE — Telephone Encounter (Signed)
Today had a telephone visit with Kevin [name].  He states doing well.  He is now established and familiar with getting his meds at medication management and established with [facility].  He states has all his medications right now and he has been taking them.  Verified them.  He denies any problems, denies chest pain, shortness of breath, headaches or dizziness.  He watches what he eats as much as he can due to getting food from pantries and low income.  He watches how much fluid he drinks.  He is aware of up coming appts.  Will continue to visit for heart failure, medication compliance and diet.   [name] [place] EMT-Paramedic [phone]

## 2020-03-28 ENCOUNTER — Telehealth: Payer: Self-pay | Admitting: Gerontology

## 2020-03-28 NOTE — Telephone Encounter (Addendum)
Left vm to reschedule missed lab appt and upcoming fu appt... in the future do not book appt with patient unless he is spoken to directly and made aware of said appt (ie not just a vm left)  ----- Message from Rolm Gala, NP sent at 03/21/2020  8:32 AM EDT ----- Pls schedule a lab appointment for Mr Zingg tomorrow, call and notify patient. Pls make a telephone note. Thank you

## 2020-03-29 ENCOUNTER — Other Ambulatory Visit (HOSPITAL_COMMUNITY): Payer: Self-pay

## 2020-03-29 ENCOUNTER — Encounter (HOSPITAL_COMMUNITY): Payer: Self-pay

## 2020-03-29 NOTE — Progress Notes (Signed)
Today had a home visit with Merlyn Albert.  Advised him of his [name] appt changed to a different day. He said that was good with him and to advise them to see if Mother can come with him.  He states doing well.  He did advise he has not been with his phone past couple of days because he has been enjoying the cooler weather.  He says he does not know how to check his messages on his phone, no one knows the passcode for it. He is not aware that he missed lab work last week or have appt [facility].  He states will [facility].  Attempted to call them, no one can answer the phone, hopefully they will see him, there is a comment in the note to reschedule it due to missed lab work. He has minimal swelling in legs.  He denies chest pain, shortness of breath, headaches or dizziness.  He states has plenty of food and everything for everyday living.  Will continue to visit for heart failure and medication compliance.   [name] [place] EMT-Paramedic [phone]

## 2020-03-30 ENCOUNTER — Ambulatory Visit: Payer: Medicaid Other | Admitting: Gerontology

## 2020-03-30 ENCOUNTER — Other Ambulatory Visit: Payer: Self-pay

## 2020-03-30 ENCOUNTER — Encounter: Payer: Self-pay | Admitting: Gerontology

## 2020-03-30 VITALS — BP 148/93 | HR 58 | Ht 68.0 in | Wt 195.0 lb

## 2020-03-30 DIAGNOSIS — R7303 Prediabetes: Secondary | ICD-10-CM | POA: Insufficient documentation

## 2020-03-30 DIAGNOSIS — I10 Essential (primary) hypertension: Secondary | ICD-10-CM

## 2020-03-30 NOTE — Progress Notes (Signed)
Established Patient Office Visit  Subjective:  Patient ID: Kevin [name], male    DOB: 1955-12-06  Age: 64 y.o. MRN: [id number]  CC: No chief complaint on file.   HPI Kevin [name] presents for follow up of hypertension. He reports that he's compliant with his medications and continues to work on adhering to [employer]. He checks his blood pressure every other day, and states that his blood pressure readings are usually less than 140/90. He states that he smokes 2 cigarettes daily and continues to work on quitting. He states that his breathing is stable and has not been using his Spiriva and Breo, but denies any COPD flare. He missed his lab appointment because he has no transportation. Overall, he states that he's doing well and offers no further complaint.  Past Medical History:  Diagnosis Date   Chronic combined systolic (congestive) and diastolic (congestive) heart failure ([place])    a. 2019 reported neg stress test and nl cors on cath in [facility]; b. 07/2019 Echo: EF 25-30%, Gr2 DD, glob HK. Mod reduced RV fxn w/ volume overload. Mod dil RA. Mild to mod TR. Mod elev PASP.   COPD (chronic obstructive pulmonary disease) ([facility])    Diabetes mellitus without complication ([facility])    Hypertension    Noncompliance    Pleural effusion on right    a. 07/2019 Thoracentesis: 300 ml.    No past surgical history on file.  No family history on file.  Social History   Socioeconomic History   Marital status: Single    Spouse name: Not on file   Number of children: Not on file   Years of education: Not on file   Highest education level: Not on file  Occupational History   Not on file  Tobacco Use   Smoking status: Current Some Day Smoker    Packs/day: 0.25    Years: 20.00    Pack years: 5.00   Smokeless tobacco: Never Used   Tobacco comment: a pack lasts a week (appx. 4 days)  Vaping Use   Vaping Use: Never used  Substance and Sexual Activity   Alcohol use: Never   Drug  use: Not Currently   Sexual activity: Not Currently  Other Topics Concern   Not on file  Social History Narrative   Patient moved to [place] from [place] in March of this year, to take care of his Mother. She lives in a community managed by her church, "independent living for seniors." Patient lives with his Mother, does not drive, and reports he mostly keeps to himself.   Social Determinants of Health   Financial Resource Strain: Medium Risk   Difficulty of Paying Living Expenses: Somewhat hard  Food Insecurity: Food Insecurity Present   Worried About Charity fundraiser in the Last Year: Sometimes true   Arboriculturist in the Last Year: Sometimes true  Transportation Needs: Public librarian (Medical): Yes   Lack of Transportation (Non-Medical): Yes  Physical Activity: Insufficiently Active   Days of Exercise per Week: 2 days   Minutes of Exercise per Session: 30 min  Stress: No Stress Concern Present   Feeling of Stress : Not at all  Social Connections: Moderately Isolated   Frequency of Communication with Friends and Family: Once a week   Frequency of Social Gatherings with Friends and Family: Once a week   Attends Religious Services: 1 to 4 times per year   Active Member of  Clubs or Organizations: Yes   Attends Archivist Meetings: Never   Marital Status: Never married  Human resources officer Violence: Not At Risk   Fear of Current or Ex-Partner: No   Emotionally Abused: No   Physically Abused: No   Sexually Abused: No    Outpatient Medications Prior to Visit  Medication Sig Dispense Refill   albuterol (VENTOLIN HFA) 108 (90 Base) MCG/ACT inhaler Inhale 2 puffs into the lungs every 6 (six) hours as needed for wheezing or shortness of breath. 18 g 3   amLODipine (NORVASC) 5 MG tablet Take 1 tablet (5 mg total) by mouth daily. 90 tablet 1   carvedilol (COREG) 25 MG tablet Take 1 tablet (25 mg total) by  mouth 2 (two) times daily. 60 tablet 3   furosemide (LASIX) 40 MG tablet Take 1 tablet (40 mg total) by mouth daily. 90 tablet 1   gabapentin (NEURONTIN) 300 MG capsule Take 300 mg by mouth 2 (two) times daily.     naproxen (NAPROSYN) 500 MG tablet Take 1 tablet (500 mg total) by mouth 2 (two) times daily with a meal. 20 tablet 0   pravastatin (PRAVACHOL) 20 MG tablet Take 1 tablet (20 mg total) by mouth daily. 30 tablet 2   sacubitril-valsartan (ENTRESTO) 97-103 MG Take 1 tablet by mouth 2 (two) times daily. 180 tablet 3   spironolactone (ALDACTONE) 25 MG tablet Take 1 tablet (25 mg total) by mouth daily. 30 tablet 5   Blood Pressure KIT 1 kit by Does not apply route daily. 1 kit 0   fluticasone furoate-vilanterol (BREO ELLIPTA) 200-25 MCG/INH AEPB Inhale 1 puff into the lungs daily. (Patient not taking: Reported on 02/24/2020) 60 each 3   tiotropium (SPIRIVA) 18 MCG inhalation capsule Place 1 capsule (18 mcg total) into inhaler and inhale daily. (Patient not taking: Reported on 02/24/2020) 30 capsule 3   No facility-administered medications prior to visit.    No Known Allergies  ROS Review of Systems  Constitutional: Negative.   Eyes: Negative.   Respiratory: Negative.   Cardiovascular: Negative.   Neurological: Negative.   Psychiatric/Behavioral: Negative.       Objective:    Physical Exam HENT:     Head: Normocephalic and atraumatic.  Eyes:     Extraocular Movements: Extraocular movements intact.     Pupils: Pupils are equal, round, and reactive to light.  Cardiovascular:     Rate and Rhythm: Regular rhythm. Bradycardia present.     Pulses: Normal pulses.     Heart sounds: Normal heart sounds.  Pulmonary:     Effort: Pulmonary effort is normal.     Breath sounds: Normal breath sounds.  Neurological:     General: No focal deficit present.     Mental Status: He is alert and oriented to person, place, and time. Mental status is at baseline.  Psychiatric:         Mood and Affect: Mood normal.        Behavior: Behavior normal.        Thought Content: Thought content normal.        Judgment: Judgment normal.     BP (!) 148/93 (BP Location: Left Arm, Patient Position: Sitting)    Pulse (!) 58    Ht _0  (1.727 m)    Wt 195 lb (88.5 kg)    SpO2 98%    BMI 29.65 kg/m  Wt Readings from Last 3 Encounters:  03/30/20 195 lb (88.5 kg)  03/29/20 197 lb (  89.4 kg)  03/17/20 197 lb (89.4 kg)     Health Maintenance Due  Topic Date Due   Hepatitis C Screening  Never done   PNEUMOCOCCAL POLYSACCHARIDE VACCINE AGE 23-64 HIGH RISK  Never done   FOOT EXAM  Never done   OPHTHALMOLOGY EXAM  Never done   URINE MICROALBUMIN  Never done   TETANUS/TDAP  Never done   COLONOSCOPY  Never done   INFLUENZA VACCINE  03/26/2020    There are no preventive care reminders to display for this patient.  No results found for: TSH Lab Results  Component Value Date   WBC 9.8 03/17/2020   HGB 14.6 03/17/2020   HCT 42.6 03/17/2020   MCV 95.9 03/17/2020   PLT 251 03/17/2020   Lab Results  Component Value Date   NA 140 03/17/2020   K 4.6 03/17/2020   CO2 29 03/17/2020   GLUCOSE 133 (H) 03/17/2020   BUN 22 03/17/2020   CREATININE 2.19 (H) 03/17/2020   BILITOT 1.1 03/17/2020   ALKPHOS 70 03/17/2020   AST 33 03/17/2020   ALT 25 03/17/2020   PROT 8.0 03/17/2020   ALBUMIN 4.3 03/17/2020   CALCIUM 9.2 03/17/2020   ANIONGAP 9 03/17/2020   Lab Results  Component Value Date   CHOL 213 (H) 12/15/2019   Lab Results  Component Value Date   HDL 42 12/15/2019   Lab Results  Component Value Date   LDLCALC 158 (H) 12/15/2019   Lab Results  Component Value Date   TRIG 72 12/15/2019   Lab Results  Component Value Date   CHOLHDL 5.1 (H) 12/15/2019   Lab Results  Component Value Date   HGBA1C 5.9 (H) 12/15/2019      Assessment & Plan:    1. Essential hypertension - His blood pressure is not under control, he will continue on his current   treatment regimen, bring medication to visit, and was encouraged to follow up at the [facility] on 04/07/2020. His goal blood pressure should be less than 130/80. He was advised to continue on DASH diet and exercise as tolerated. He's to check his blood pressure every other day, record and bring log to follow up appointment. -His HR was 58 b.p.m, he was advised to notify clinic or go to the ED with chest pain, shortness of breath or with worsening symptoms.    Follow-up: Return in about 5 weeks (around 05/04/2020), or if symptoms worsen or fail to improve.    Oneal Biglow Jerold Coombe, NP

## 2020-03-30 NOTE — Patient Instructions (Addendum)
DASH Eating Plan DASH stands for "Dietary Approaches to Stop Hypertension." The DASH eating plan is a healthy eating plan that has been shown to reduce high blood pressure (hypertension). It may also reduce your risk for type 2 diabetes, heart disease, and stroke. The DASH eating plan may also help with weight loss. What are tips for following this plan?  General guidelines  Avoid eating more than 2,300 mg (milligrams) of salt (sodium) a day. If you have hypertension, you may need to reduce your sodium intake to 1,500 mg a day.  Limit alcohol intake to no more than 1 drink a day for nonpregnant women and 2 drinks a day for men. One drink equals 12 oz of beer, 5 oz of wine, or 1 oz of hard liquor.  Work with your health care provider to maintain a healthy body weight or to lose weight. Ask what an ideal weight is for you.  Get at least 30 minutes of exercise that causes your heart to beat faster (aerobic exercise) most days of the week. Activities may include walking, swimming, or biking.  Work with your health care provider or diet and nutrition specialist (dietitian) to adjust your eating plan to your individual calorie needs. Reading food labels   Check food labels for the amount of sodium per serving. Choose foods with less than 5 percent of the Daily Value of sodium. Generally, foods with less than 300 mg of sodium per serving fit into this eating plan.  To find whole grains, look for the word "whole" as the first word in the ingredient list. Shopping  Buy products labeled as "low-sodium" or "no salt added."  Buy fresh foods. Avoid canned foods and premade or frozen meals. Cooking  Avoid adding salt when cooking. Use salt-free seasonings or herbs instead of table salt or sea salt. Check with your health care provider or pharmacist before using salt substitutes.  Do not fry foods. Cook foods using healthy methods such as baking, boiling, grilling, and broiling instead.  Cook with  heart-healthy oils, such as olive, canola, soybean, or sunflower oil. Meal planning  Eat a balanced diet that includes: ? 5 or more servings of fruits and vegetables each day. At each meal, try to fill half of your plate with fruits and vegetables. ? Up to 6-8 servings of whole grains each day. ? Less than 6 oz of lean meat, poultry, or fish each day. A 3-oz serving of meat is about the same size as a deck of cards. One egg equals 1 oz. ? 2 servings of low-fat dairy each day. ? A serving of nuts, seeds, or beans 5 times each week. ? Heart-healthy fats. Healthy fats called Omega-3 fatty acids are found in foods such as flaxseeds and coldwater fish, like sardines, salmon, and mackerel.  Limit how much you eat of the following: ? Canned or prepackaged foods. ? Food that is high in trans fat, such as fried foods. ? Food that is high in saturated fat, such as fatty meat. ? Sweets, desserts, sugary drinks, and other foods with added sugar. ? Full-fat dairy products.  Do not salt foods before eating.  Try to eat at least 2 vegetarian meals each week.  Eat more home-cooked food and less restaurant, buffet, and fast food.  When eating at a restaurant, ask that your food be prepared with less salt or no salt, if possible. What foods are recommended? The items listed may not be a complete list. Talk with your dietitian about   what dietary choices are best for you. Grains Whole-grain or whole-wheat bread. Whole-grain or whole-wheat pasta. Brown rice. Oatmeal. Quinoa. Bulgur. Whole-grain and low-sodium cereals. Pita bread. Low-fat, low-sodium crackers. Whole-wheat flour tortillas. Vegetables Fresh or frozen vegetables (raw, steamed, roasted, or grilled). Low-sodium or reduced-sodium tomato and vegetable juice. Low-sodium or reduced-sodium tomato sauce and tomato paste. Low-sodium or reduced-sodium canned vegetables. Fruits All fresh, dried, or frozen fruit. Canned fruit in natural juice (without  added sugar). Meat and other protein foods Skinless chicken or turkey. Ground chicken or turkey. Pork with fat trimmed off. Fish and seafood. Egg whites. Dried beans, peas, or lentils. Unsalted nuts, nut butters, and seeds. Unsalted canned beans. Lean cuts of beef with fat trimmed off. Low-sodium, lean deli meat. Dairy Low-fat (1%) or fat-free (skim) milk. Fat-free, low-fat, or reduced-fat cheeses. Nonfat, low-sodium ricotta or cottage cheese. Low-fat or nonfat yogurt. Low-fat, low-sodium cheese. Fats and oils Soft margarine without trans fats. Vegetable oil. Low-fat, reduced-fat, or light mayonnaise and salad dressings (reduced-sodium). Canola, safflower, olive, soybean, and sunflower oils. Avocado. Seasoning and other foods Herbs. Spices. Seasoning mixes without salt. Unsalted popcorn and pretzels. Fat-free sweets. What foods are not recommended? The items listed may not be a complete list. Talk with your dietitian about what dietary choices are best for you. Grains Baked goods made with fat, such as croissants, muffins, or some breads. Dry pasta or rice meal packs. Vegetables Creamed or fried vegetables. Vegetables in a cheese sauce. Regular canned vegetables (not low-sodium or reduced-sodium). Regular canned tomato sauce and paste (not low-sodium or reduced-sodium). Regular tomato and vegetable juice (not low-sodium or reduced-sodium). Pickles. Olives. Fruits Canned fruit in a light or heavy syrup. Fried fruit. Fruit in cream or butter sauce. Meat and other protein foods Fatty cuts of meat. Ribs. Fried meat. Bacon. Sausage. Bologna and other processed lunch meats. Salami. Fatback. Hotdogs. Bratwurst. Salted nuts and seeds. Canned beans with added salt. Canned or smoked fish. Whole eggs or egg yolks. Chicken or turkey with skin. Dairy Whole or 2% milk, cream, and half-and-half. Whole or full-fat cream cheese. Whole-fat or sweetened yogurt. Full-fat cheese. Nondairy creamers. Whipped toppings.  Processed cheese and cheese spreads. Fats and oils Butter. Stick margarine. Lard. Shortening. Ghee. Bacon fat. Tropical oils, such as coconut, palm kernel, or palm oil. Seasoning and other foods Salted popcorn and pretzels. Onion salt, garlic salt, seasoned salt, table salt, and sea salt. Worcestershire sauce. Tartar sauce. Barbecue sauce. Teriyaki sauce. Soy sauce, including reduced-sodium. Steak sauce. Canned and packaged gravies. Fish sauce. Oyster sauce. Cocktail sauce. Horseradish that you find on the shelf. Ketchup. Mustard. Meat flavorings and tenderizers. Bouillon cubes. Hot sauce and Tabasco sauce. Premade or packaged marinades. Premade or packaged taco seasonings. Relishes. Regular salad dressings. Where to find more information:  National Heart, Lung, and Blood Institute: www.nhlbi.nih.gov  American Heart Association: www.heart.org Summary  The DASH eating plan is a healthy eating plan that has been shown to reduce high blood pressure (hypertension). It may also reduce your risk for type 2 diabetes, heart disease, and stroke.  With the DASH eating plan, you should limit salt (sodium) intake to 2,300 mg a day. If you have hypertension, you may need to reduce your sodium intake to 1,500 mg a day.  When on the DASH eating plan, aim to eat more fresh fruits and vegetables, whole grains, lean proteins, low-fat dairy, and heart-healthy fats.  Work with your health care provider or diet and nutrition specialist (dietitian) to adjust your eating plan to your   individual calorie needs. This information is not intended to replace advice given to you by your health care provider. Make sure you discuss any questions you have with your health care provider. Document Revised: 07/25/2017 Document Reviewed: 08/05/2016 Elsevier Patient Education  2020 Else[employer] Inc.  Prediabetes Eating Plan Prediabetes is a condition that causes blood sugar (glucose) levels to be higher than normal. This increases  the risk for developing diabetes. In order to prevent diabetes from developing, your health care provider may recommend a diet and other lifestyle changes to help you:  Control your blood glucose levels.  Improve your cholesterol levels.  Manage your blood pressure. Your health care provider may recommend working with a diet and nutrition specialist (dietitian) to make a meal plan that is best for you. What are tips for following this plan? Lifestyle  Set weight loss goals with the help of your health care team. It is recommended that most people with prediabetes lose 7% of their current body weight.  Exercise for at least 30 minutes at least 5 days a week.  Attend a support group or seek ongoing support from a mental health counselor.  Take over-the-counter and prescription medicines only as told by your health care provider. Reading food labels  Read food labels to check the amount of fat, salt (sodium), and sugar in prepackaged foods. Avoid foods that have: ? Saturated fats. ? Trans fats. ? Added sugars.  Avoid foods that have more than 300 milligrams (mg) of sodium per serving. Limit your daily sodium intake to less than 2,300 mg each day. Shopping  Avoid buying pre-made and processed foods. Cooking  Cook with olive oil. Do not use butter, lard, or ghee.  Bake, broil, grill, or boil foods. Avoid frying. Meal planning   Work with your dietitian to develop an eating plan that is right for you. This may include: ? Tracking how many calories you take in. Use a food diary, notebook, or mobile application to track what you eat at each meal. ? Using the glycemic index (GI) to plan your meals. The index tells you how quickly a food will raise your blood glucose. Choose low-GI foods. These foods take a longer time to raise blood glucose.  Consider following a Mediterranean diet. This diet includes: ? Several servings each day of fresh fruits and vegetables. ? Eating fish at  least twice a week. ? Several servings each day of whole grains, beans, nuts, and seeds. ? Using olive oil instead of other fats. ? Moderate alcohol consumption. ? Eating small amounts of red meat and whole-fat dairy.  If you have high blood pressure, you may need to limit your sodium intake or follow a diet such as the DASH eating plan. DASH is an eating plan that aims to lower high blood pressure. What foods are recommended? The items listed below may not be a complete list. Talk with your dietitian about what dietary choices are best for you. Grains Whole grains, such as whole-wheat or whole-grain breads, crackers, cereals, and pasta. Unsweetened oatmeal. Bulgur. Barley. Quinoa. Brown rice. Corn or whole-wheat flour tortillas or taco shells. Vegetables Lettuce. Spinach. Peas. Beets. Cauliflower. Cabbage. Broccoli. Carrots. Tomatoes. Squash. Eggplant. Herbs. Peppers. Onions. Cucumbers. Brussels sprouts. Fruits Berries. Bananas. Apples. Oranges. Grapes. Papaya. Mango. Pomegranate. Kiwi. Grapefruit. Cherries. Meats and other protein foods Seafood. Poultry without skin. Lean cuts of pork and beef. Tofu. Eggs. Nuts. Beans. Dairy Low-fat or fat-free dairy products, such as yogurt, cottage cheese, and cheese. Beverages Water. Tea. Coffee.   Sugar-free or diet soda. Seltzer water. Lowfat or no-fat milk. Milk alternatives, such as soy or almond milk. Fats and oils Olive oil. Canola oil. Sunflower oil. Grapeseed oil. Avocado. Walnuts. Sweets and desserts Sugar-free or low-fat pudding. Sugar-free or low-fat ice cream and other frozen treats. Seasoning and other foods Herbs. Sodium-free spices. Mustard. Relish. Low-fat, low-sugar ketchup. Low-fat, low-sugar barbecue sauce. Low-fat or fat-free mayonnaise. What foods are not recommended? The items listed below may not be a complete list. Talk with your dietitian about what dietary choices are best for you. Grains Refined white flour and flour  products, such as bread, pasta, snack foods, and cereals. Vegetables Canned vegetables. Frozen vegetables with butter or cream sauce. Fruits Fruits canned with syrup. Meats and other protein foods Fatty cuts of meat. Poultry with skin. Breaded or fried meat. Processed meats. Dairy Full-fat yogurt, cheese, or milk. Beverages Sweetened drinks, such as sweet iced tea and soda. Fats and oils Butter. Lard. Ghee. Sweets and desserts Baked goods, such as cake, cupcakes, pastries, cookies, and cheesecake. Seasoning and other foods Spice mixes with added salt. Ketchup. Barbecue sauce. Mayonnaise. Summary  To prevent diabetes from developing, you may need to make diet and other lifestyle changes to help control blood sugar, improve cholesterol levels, and manage your blood pressure.  Set weight loss goals with the help of your health care team. It is recommended that most people with prediabetes lose 7 percent of their current body weight.  Consider following a Mediterranean diet that includes plenty of fresh fruits and vegetables, whole grains, beans, nuts, seeds, fish, lean meat, low-fat dairy, and healthy oils. This information is not intended to replace advice given to you by your health care provider. Make sure you discuss any questions you have with your health care provider. Document Revised: 12/04/2018 Document Reviewed: 10/16/2016 Elsevier Patient Education  2020 Elsevier Inc.  Prediabetes Eating Plan Prediabetes is a condition that causes blood sugar (glucose) levels to be higher than normal. This increases the risk for developing diabetes. In order to prevent diabetes from developing, your health care provider may recommend a diet and other lifestyle changes to help you:  Control your blood glucose levels.  Improve your cholesterol levels.  Manage your blood pressure. Your health care provider may recommend working with a diet and nutrition specialist (dietitian) to make a meal  plan that is best for you. What are tips for following this plan? Lifestyle  Set weight loss goals with the help of your health care team. It is recommended that most people with prediabetes lose 7% of their current body weight.  Exercise for at least 30 minutes at least 5 days a week.  Attend a support group or seek ongoing support from a mental health counselor.  Take over-the-counter and prescription medicines only as told by your health care provider. Reading food labels  Read food labels to check the amount of fat, salt (sodium), and sugar in prepackaged foods. Avoid foods that have: ? Saturated fats. ? Trans fats. ? Added sugars.  Avoid foods that have more than 300 milligrams (mg) of sodium per serving. Limit your daily sodium intake to less than 2,300 mg each day. Shopping  Avoid buying pre-made and processed foods. Cooking  Cook with olive oil. Do not use butter, lard, or ghee.  Bake, broil, grill, or boil foods. Avoid frying. Meal planning   Work with your dietitian to develop an eating plan that is right for you. This may include: ? Tracking how many calories  you take in. Use a food diary, notebook, or mobile application to track what you eat at each meal. ? Using the glycemic index (GI) to plan your meals. The index tells you how quickly a food will raise your blood glucose. Choose low-GI foods. These foods take a longer time to raise blood glucose.  Consider following a Mediterranean diet. This diet includes: ? Several servings each day of fresh fruits and vegetables. ? Eating fish at least twice a week. ? Several servings each day of whole grains, beans, nuts, and seeds. ? Using olive oil instead of other fats. ? Moderate alcohol consumption. ? Eating small amounts of red meat and whole-fat dairy.  If you have high blood pressure, you may need to limit your sodium intake or follow a diet such as the DASH eating plan. DASH is an eating plan that aims to lower  high blood pressure. What foods are recommended? The items listed below may not be a complete list. Talk with your dietitian about what dietary choices are best for you. Grains Whole grains, such as whole-wheat or whole-grain breads, crackers, cereals, and pasta. Unsweetened oatmeal. Bulgur. Barley. Quinoa. Brown rice. Corn or whole-wheat flour tortillas or taco shells. Vegetables Lettuce. Spinach. Peas. Beets. Cauliflower. Cabbage. Broccoli. Carrots. Tomatoes. Squash. Eggplant. Herbs. Peppers. Onions. Cucumbers. Brussels sprouts. Fruits Berries. Bananas. Apples. Oranges. Grapes. Papaya. Mango. Pomegranate. Kiwi. Grapefruit. Cherries. Meats and other protein foods Seafood. Poultry without skin. Lean cuts of pork and beef. Tofu. Eggs. Nuts. Beans. Dairy Low-fat or fat-free dairy products, such as yogurt, cottage cheese, and cheese. Beverages Water. Tea. Coffee. Sugar-free or diet soda. Seltzer water. Lowfat or no-fat milk. Milk alternatives, such as soy or almond milk. Fats and oils Olive oil. Canola oil. Sunflower oil. Grapeseed oil. Avocado. Walnuts. Sweets and desserts Sugar-free or low-fat pudding. Sugar-free or low-fat ice cream and other frozen treats. Seasoning and other foods Herbs. Sodium-free spices. Mustard. Relish. Low-fat, low-sugar ketchup. Low-fat, low-sugar barbecue sauce. Low-fat or fat-free mayonnaise. What foods are not recommended? The items listed below may not be a complete list. Talk with your dietitian about what dietary choices are best for you. Grains Refined white flour and flour products, such as bread, pasta, snack foods, and cereals. Vegetables Canned vegetables. Frozen vegetables with butter or cream sauce. Fruits Fruits canned with syrup. Meats and other protein foods Fatty cuts of meat. Poultry with skin. Breaded or fried meat. Processed meats. Dairy Full-fat yogurt, cheese, or milk. Beverages Sweetened drinks, such as sweet iced tea and soda. Fats  and oils Butter. Lard. Ghee. Sweets and desserts Baked goods, such as cake, cupcakes, pastries, cookies, and cheesecake. Seasoning and other foods Spice mixes with added salt. Ketchup. Barbecue sauce. Mayonnaise. Summary  To prevent diabetes from developing, you may need to make diet and other lifestyle changes to help control blood sugar, improve cholesterol levels, and manage your blood pressure.  Set weight loss goals with the help of your health care team. It is recommended that most people with prediabetes lose 7 percent of their current body weight.  Consider following a Mediterranean diet that includes plenty of fresh fruits and vegetables, whole grains, beans, nuts, seeds, fish, lean meat, low-fat dairy, and healthy oils. This information is not intended to replace advice given to you by your health care provider. Make sure you discuss any questions you have with your health care provider. Document Revised: 12/04/2018 Document Reviewed: 10/16/2016 Elsevier Patient Education  2020 [employer].

## 2020-04-07 ENCOUNTER — Ambulatory Visit: Payer: Medicaid - Out of State | Admitting: Family

## 2020-04-07 NOTE — Progress Notes (Deleted)
Patient ID: Kevin [name], male    DOB: December 04, 1955, 64 y.o.   MRN: [id number]  HPI  Kevin [name] is a 64 y/o male with a history of HTN, COPD, tobacco use and chronic heart failure.   Echo report from 01/13/20 reviewed and showed an EF of 50-55% along with moderate LVH. Echo report from 08/04/2019 reviewed and showed an EF of 25-30% along with mild/moderate TR.   Was in the ED 03/17/20 due to vomiting after eating a lot of fruit. IVF given due to kidney function and he was released.    He presents today with a chief complaint of a follow-up visit.   Past Medical History:  Diagnosis Date  . Chronic combined systolic (congestive) and diastolic (congestive) heart failure (HCC)    a. 2019 reported neg stress test and nl cors on cath in [facility]; b. 07/2019 Echo: EF 25-30%, Gr2 DD, glob HK. Mod reduced RV fxn w/ volume overload. Mod dil RA. Mild to mod TR. Mod elev PASP.  Marland Kitchen COPD (chronic obstructive pulmonary disease) (HCC)   . Diabetes mellitus without complication (HCC)   . Hypertension   . Noncompliance   . Pleural effusion on right    a. 07/2019 Thoracentesis: 300 ml.    Social History   Tobacco Use  . Smoking status: Current Some Day Smoker    Packs/day: 0.25    Years: 20.00    Pack years: 5.00  . Smokeless tobacco: Never Used  . Tobacco comment: a pack lasts a week (appx. 4 days)  Substance Use Topics  . Alcohol use: Never   No Known Allergies     Review of Systems  Constitutional: Negative for appetite change and fatigue.  HENT: Negative for congestion, postnasal drip and sore throat.   Eyes: Negative.   Respiratory: Negative for cough, chest tightness and shortness of breath.   Cardiovascular: Negative for chest pain, palpitations and leg swelling.  Gastrointestinal: Negative for abdominal distention and abdominal pain.  Endocrine: Negative.   Genitourinary: Negative.   Musculoskeletal: Negative for back pain and neck pain.  Skin: Negative.   Allergic/Immunologic:  Negative.   Neurological: Negative for dizziness and light-headedness.  Hematological: Negative for adenopathy. Does not bruise/bleed easily.  Psychiatric/Behavioral: Negative for dysphoric mood and sleep disturbance (sleeping on 1 pillows). The patient is not nervous/anxious.       Physical Exam Vitals and nursing note reviewed.  Constitutional:      Appearance: Normal appearance.  HENT:     Head: Normocephalic and atraumatic.  Neck:     Vascular: No carotid bruit.  Cardiovascular:     Rate and Rhythm: Normal rate and regular rhythm.  Pulmonary:     Effort: Pulmonary effort is normal. No respiratory distress.     Breath sounds: No wheezing or rales.  Abdominal:     General: Abdomen is flat. There is no distension.     Palpations: Abdomen is soft.  Musculoskeletal:        General: No tenderness.     Cervical back: Normal range of motion and neck supple.     Right lower leg: No tenderness. No edema.     Left lower leg: No tenderness. No edema.  Skin:    General: Skin is warm and dry.  Neurological:     General: No focal deficit present.     Mental Status: He is alert and oriented to person, place, and time.  Psychiatric:        Mood and Affect: Mood  normal.        Behavior: Behavior normal.    Assessment & Plan:  1: Chronic heart failure with now preserved ejection fraction along with structural changes- - NYHA class I - euvolemic today - weighing daily and reinforced to call for an overnight weight gain of >2 pounds or a weekly weight gain of >5 pounds - weight 195 pounds from last visit here 2 months ago - not adding salt and has been trying to read food labels; reviewed the importance of closely following a 2000mg  sodium diet  - saw cardiology (Ag[facility]-Etang) 01/14/20 - participating in paramedicine program - BNP 08/03/2019 was 2085.0 - patient says that he's received both COVID vaccines  2: HTN- - BP - saw PCP at [facility] 03/30/20 - BMP on 03/17/20 reviewed  and showed sodium 140, potassium 4.6, creatinine 2.19 and GFR 36 - will check CMET, lipid panel, UA & A1c per PCP's request due to transportation issues  3: COPD-  - using nebulizer and inhalers - smoking 4 cigarettes daily   Patient had brought his medications

## 2020-04-11 ENCOUNTER — Ambulatory Visit: Payer: Medicaid - Out of State | Admitting: Family

## 2020-04-12 ENCOUNTER — Telehealth: Payer: Self-pay | Admitting: Pharmacist

## 2020-04-12 NOTE — Telephone Encounter (Signed)
04/12/2020 3:22:11 PM - Virgel Bouquet Ellipta refill online with [facility]  -- Rhetta Mura - Wednesday, April 12, 2020 3:21 PM --Placed refill online with [facility] for Kevin [name] to ship 04/25/2020, Order# [id number]

## 2020-04-26 ENCOUNTER — Ambulatory Visit: Payer: Medicaid - Out of State | Admitting: Family

## 2020-04-27 ENCOUNTER — Encounter: Payer: Self-pay | Admitting: Family

## 2020-04-27 ENCOUNTER — Other Ambulatory Visit: Payer: Self-pay

## 2020-04-27 ENCOUNTER — Other Ambulatory Visit: Payer: Self-pay | Admitting: Family

## 2020-04-27 ENCOUNTER — Ambulatory Visit: Payer: Medicaid - Out of State | Attending: Family | Admitting: Family

## 2020-04-27 VITALS — BP 188/104 | HR 55 | Resp 18 | Ht 68.0 in | Wt 194.5 lb

## 2020-04-27 DIAGNOSIS — Z7951 Long term (current) use of inhaled steroids: Secondary | ICD-10-CM | POA: Insufficient documentation

## 2020-04-27 DIAGNOSIS — Z791 Long term (current) use of non-steroidal anti-inflammatories (NSAID): Secondary | ICD-10-CM | POA: Insufficient documentation

## 2020-04-27 DIAGNOSIS — I110 Hypertensive heart disease with heart failure: Secondary | ICD-10-CM | POA: Insufficient documentation

## 2020-04-27 DIAGNOSIS — J449 Chronic obstructive pulmonary disease, unspecified: Secondary | ICD-10-CM | POA: Insufficient documentation

## 2020-04-27 DIAGNOSIS — I10 Essential (primary) hypertension: Secondary | ICD-10-CM

## 2020-04-27 DIAGNOSIS — I5042 Chronic combined systolic (congestive) and diastolic (congestive) heart failure: Secondary | ICD-10-CM | POA: Insufficient documentation

## 2020-04-27 DIAGNOSIS — Z79899 Other long term (current) drug therapy: Secondary | ICD-10-CM | POA: Insufficient documentation

## 2020-04-27 DIAGNOSIS — F17210 Nicotine dependence, cigarettes, uncomplicated: Secondary | ICD-10-CM | POA: Insufficient documentation

## 2020-04-27 DIAGNOSIS — E119 Type 2 diabetes mellitus without complications: Secondary | ICD-10-CM | POA: Insufficient documentation

## 2020-04-27 LAB — LIPID PANEL
Cholesterol: 167 mg/dL (ref 0–200)
HDL: 44 mg/dL (ref >40)
LDL Cholesterol: 107 mg/dL — ABNORMAL HIGH (ref 0–99)
Total CHOL/HDL Ratio: 3.8 RATIO
Triglycerides: 81 mg/dL (ref <150)
VLDL: 16 mg/dL (ref 0–40)

## 2020-04-27 LAB — COMPREHENSIVE METABOLIC PANEL
ALT: 30 U/L (ref 0–44)
AST: 35 U/L (ref 15–41)
Albumin: 4.3 g/dL (ref 3.5–5.0)
Alkaline Phosphatase: 83 U/L (ref 38–126)
Anion gap: 12 (ref 5–15)
BUN: 12 mg/dL (ref 8–23)
CO2: 26 mmol/L (ref 22–32)
Calcium: 9.1 mg/dL (ref 8.9–10.3)
Chloride: 106 mmol/L (ref 98–111)
Creatinine, Ser: 1.09 mg/dL (ref 0.61–1.24)
GFR calc Af Amer: >60 mL/min (ref >60)
GFR calc non Af Amer: >60 mL/min (ref >60)
Glucose, Bld: 101 mg/dL — ABNORMAL HIGH (ref 70–99)
Potassium: 3.9 mmol/L (ref 3.5–5.1)
Sodium: 144 mmol/L (ref 135–145)
Total Bilirubin: 0.7 mg/dL (ref 0.3–1.2)
Total Protein: 7.8 g/dL (ref 6.5–8.1)

## 2020-04-27 LAB — URINALYSIS, ROUTINE W REFLEX MICROSCOPIC
Bacteria, UA: NONE SEEN
Bilirubin Urine: NEGATIVE
Glucose, UA: NEGATIVE mg/dL
Hgb urine dipstick: NEGATIVE
Ketones, ur: NEGATIVE mg/dL
Leukocytes,Ua: NEGATIVE
Nitrite: NEGATIVE
Protein, ur: NEGATIVE mg/dL
Specific Gravity, Urine: 1.006 (ref 1.005–1.030)
Squamous Epithelial / HPF: NONE SEEN (ref 0–5)
WBC, UA: NONE SEEN WBC/hpf (ref 0–5)
pH: 5 (ref 5.0–8.0)

## 2020-04-27 LAB — HEMOGLOBIN A1C
Hgb A1c MFr Bld: 5.8 % — ABNORMAL HIGH (ref 4.8–5.6)
Mean Plasma Glucose: 119.76 mg/dL

## 2020-04-27 MED ORDER — CARVEDILOL 25 MG PO TABS: 25.0000 mg | ORAL_TABLET | Freq: Two times a day (BID) | ORAL | 3 refills | Status: DC

## 2020-04-27 MED ORDER — FUROSEMIDE 40 MG PO TABS: 40.0000 mg | ORAL_TABLET | Freq: Every day | ORAL | 3 refills | Status: DC

## 2020-04-27 MED ORDER — SACUBITRIL-VALSARTAN 97-103 MG PO TABS: 1.0000 | ORAL_TABLET | Freq: Two times a day (BID) | ORAL | 3 refills | Status: DC

## 2020-04-27 MED ORDER — AMLODIPINE BESYLATE 5 MG PO TABS: 5.0000 mg | ORAL_TABLET | Freq: Every day | ORAL | 3 refills | Status: DC

## 2020-04-27 MED ORDER — SPIRONOLACTONE 25 MG PO TABS: 25.0000 mg | ORAL_TABLET | Freq: Every day | ORAL | 3 refills | Status: DC

## 2020-04-27 NOTE — Progress Notes (Signed)
Patient ID: Kevin [name], male    DOB: July 27, 1956, 64 y.o.   MRN: [id number]  HPI  [name] is a 64 y/o male with a history of HTN, COPD, tobacco use and chronic heart failure.   Echo report from 01/13/20 reviewed and showed an EF of 50-55% along with moderate LVH. Echo report from 08/04/2019 reviewed and showed an EF of 25-30% along with mild/moderate TR.   Was in the ED 03/17/20 due to vomiting after eating a lot of fruit. IVF given due to kidney function and he was released.    He presents today with a chief complaint of a follow-up visit. Currently denies any difficulty sleeping, dizziness, abdominal distention, palpitations, pedal edema, chest pain, shortness of breath, cough, fatigue or weight gain.   Says that he's been out of his amlodipine, carvedilol and spironolactone for the last 2 days "at least". Took his last dose of entresto and furosemide this morning.   Past Medical History:  Diagnosis Date  . Chronic combined systolic (congestive) and diastolic (congestive) heart failure ([place])    a. 2019 reported neg stress test and nl cors on cath in [facility]; b. 07/2019 Echo: EF 25-30%, Gr2 DD, glob HK. Mod reduced RV fxn w/ volume overload. Mod dil RA. Mild to mod TR. Mod elev PASP.  Marland Kitchen COPD (chronic obstructive pulmonary disease) ([place])   . Diabetes mellitus without complication ([place])   . Hypertension   . Noncompliance   . Pleural effusion on right    a. 07/2019 Thoracentesis: 300 ml.    Social History   Tobacco Use  . Smoking status: Current Some Day Smoker    Packs/day: 0.25    Years: 20.00    Pack years: 5.00  . Smokeless tobacco: Never Used  . Tobacco comment: a pack lasts a week (appx. 4 days)  Substance Use Topics  . Alcohol use: Never   No Known Allergies  Prior to Admission medications   Medication Sig Start Date End Date Taking? Authorizing Provider  albuterol (VENTOLIN HFA) 108 (90 Base) MCG/ACT inhaler Inhale 2 puffs into the lungs every 6 (six) hours as needed  for wheezing or shortness of breath. 09/08/19  Yes Darylene Price A, FNP  Blood Pressure KIT 1 kit by Does not apply route daily. 12/23/19  Yes Iloabachie, Chioma E, NP  carvedilol (COREG) 25 MG tablet Take 1 tablet (25 mg total) by mouth 2 (two) times daily. 11/18/19  Yes Rodrigus Kilker A, FNP  fluticasone furoate-vilanterol (BREO ELLIPTA) 200-25 MCG/INH AEPB Inhale 1 puff into the lungs daily. 09/08/19  Yes Darylene Price A, FNP  furosemide (LASIX) 40 MG tablet Take 1 tablet (40 mg total) by mouth daily. 01/14/20  Yes Agbor-Etang, Aaron Edelman, MD  gabapentin (NEURONTIN) 300 MG capsule Take 300 mg by mouth 2 (two) times daily.   Yes [provider]  naproxen (NAPROSYN) 500 MG tablet Take 1 tablet (500 mg total) by mouth 2 (two) times daily with a meal. 02/15/20  Yes Sable Feil, PA-C  pravastatin (PRAVACHOL) 20 MG tablet Take 1 tablet (20 mg total) by mouth daily. 02/24/20  Yes Iloabachie, Chioma E, NP  sacubitril-valsartan (ENTRESTO) 97-103 MG Take 1 tablet by mouth 2 (two) times daily. 11/11/19  Yes Darylene Price A, FNP  tiotropium (SPIRIVA) 18 MCG inhalation capsule Place 1 capsule (18 mcg total) into inhaler and inhale daily. 12/23/19  Yes Iloabachie, Chioma E, NP  amLODipine (NORVASC) 5 MG tablet Take 1 tablet (5 mg total) by mouth daily. 01/14/20  04/13/20  Kate Sable, MD  spironolactone (ALDACTONE) 25 MG tablet Take 1 tablet (25 mg total) by mouth daily. Patient not taking: Reported on 04/27/2020 11/18/19   Alisa Graff, FNP    Review of Systems  Constitutional: Negative for appetite change and fatigue.  HENT: Negative for congestion, postnasal drip and sore throat.   Eyes: Negative.   Respiratory: Negative for cough, chest tightness and shortness of breath.   Cardiovascular: Negative for chest pain, palpitations and leg swelling.  Gastrointestinal: Negative for abdominal distention and abdominal pain.  Endocrine: Negative.   Genitourinary: Negative.   Musculoskeletal: Negative for  back pain and neck pain.  Skin: Negative.   Allergic/Immunologic: Negative.   Neurological: Negative for dizziness and light-headedness.  Hematological: Negative for adenopathy. Does not bruise/bleed easily.  Psychiatric/Behavioral: Negative for dysphoric mood and sleep disturbance (sleeping on 1 pillows). The patient is not nervous/anxious.    Vitals:   04/27/20 1409  BP: (!) 188/104  Pulse: (!) 55  Resp: 18  SpO2: 100%  Weight: 194 lb 8 oz (88.2 kg)  Height: _0  (1.727 m)   Wt Readings from Last 3 Encounters:  04/27/20 194 lb 8 oz (88.2 kg)  03/30/20 195 lb (88.5 kg)  03/29/20 197 lb (89.4 kg)   Lab Results  Component Value Date   CREATININE 2.19 (H) 03/17/2020   CREATININE 1.24 02/10/2020   CREATININE 1.05 09/09/2019    Physical Exam Vitals and nursing note reviewed.  Constitutional:      Appearance: Normal appearance.  HENT:     Head: Normocephalic and atraumatic.  Neck:     Vascular: No carotid bruit.  Cardiovascular:     Rate and Rhythm: Normal rate and regular rhythm.  Pulmonary:     Effort: Pulmonary effort is normal. No respiratory distress.     Breath sounds: No wheezing or rales.  Abdominal:     General: Abdomen is flat. There is no distension.     Palpations: Abdomen is soft.  Musculoskeletal:        General: No tenderness.     Cervical back: Normal range of motion and neck supple.     Right lower leg: No tenderness. No edema.     Left lower leg: No tenderness. No edema.  Skin:    General: Skin is warm and dry.  Neurological:     General: No focal deficit present.     Mental Status: He is alert and oriented to person, place, and time.  Psychiatric:        Mood and Affect: Mood normal.        Behavior: Behavior normal.    Assessment & Plan:  1: Chronic heart failure with now preserved ejection fraction along with structural changes- - NYHA class I - euvolemic today - weighing daily and reinforced to call for an overnight weight gain of >2  pounds or a weekly weight gain of >5 pounds - weight unchanged from last visit here 2 months ago - not adding salt and has been trying to read food labels; reviewed the importance of closely following a 2051m sodium diet  - saw cardiology (Agbor-Etang) 01/14/20 - participating in paramedicine program - BNP 08/03/2019 was 2085.0 - patient says that he's received both COVID vaccines  2: HTN- - BP elevated but he's been out of medications for at least the last 2 days (except for entresto/furosemide) - [facility] and have contacted paramedic to make a visit with patient next week to  check his BP - saw PCP at [facility] 03/30/20 & returns next week - BMP on 03/17/20 reviewed and showed sodium 140, potassium 4.6, creatinine 2.19 and GFR 36 - will check CMET, lipid panel, UA & A1c per PCP's request due to transportation issues  3: COPD-  - using nebulizer and inhalers - smoking 4 cigarettes daily   Medications reviewed.   Return here in 1 month or sooner for any questions/problems before then.

## 2020-04-27 NOTE — Patient Instructions (Signed)
Continue weighing daily and call for an overnight weight gain of > 2 pounds or a weekly weight gain of >5 pounds. 

## 2020-05-02 ENCOUNTER — Other Ambulatory Visit (HOSPITAL_COMMUNITY): Payer: Self-pay

## 2020-05-02 NOTE — Progress Notes (Signed)
Attempted to make a home visit but [place] now is not a good day.  He is bathing his mother and he states takes several hours.  [name] advised me they were concerned of him taking his medications correctly.  Wanting to make a home visit to place meds in a box to be able to tell if he is taking them correctly.  He states I can try tomorrow.  Will call him tomorrow for home visit.    [name] EMT-Paramedic [phone]

## 2020-05-03 ENCOUNTER — Encounter (HOSPITAL_COMMUNITY): Payer: Self-pay

## 2020-05-03 ENCOUNTER — Other Ambulatory Visit (HOSPITAL_COMMUNITY): Payer: Self-pay

## 2020-05-03 NOTE — Progress Notes (Signed)
Today had a home visit with Kevin [name].  He states feeling good.  He has all his medications, he is running out of gabapentin and he says will pick up Thursday or Friday.  Placed his meds in a box for 2 weeks.  Will come back on Monday to recheck his box.  Had him to show me how to take his medications.  He appears to understand how to take them.  Explained him importance of not running out of his medications.  His blood pressure has came down since last week at [facility] clinic, he was out of meds for a couple of days.  He is walking around inside house with no assistance, uses rollator when leaves the home.  Appetite is good, he appears to have plenty food in the home.  He lives with his mother in 1 bedroom apartment.  He was sleeping in a recliner, now it has broke.  Since he is sleeping in sleeping bag on cement floor.  Will look around for a couch for him, they have no living room furniture except the broken recliner.  His Mom sleeps in hospital bed.  He states they are looking around for a 2 bedroom apartment but no success yet since it will have to be handicap assessable for her.  Contacted a couple of places for him with no openings.  He denies chest pain, headaches, shortness of breath or dizziness.  He is aware of me coming back Monday to visit.  Will visit for medication management and heart failure.   [name] EMT-Paramedic [phone]

## 2020-05-04 ENCOUNTER — Other Ambulatory Visit: Payer: Self-pay

## 2020-05-04 ENCOUNTER — Ambulatory Visit: Payer: Medicaid Other | Admitting: Gerontology

## 2020-05-04 ENCOUNTER — Encounter: Payer: Self-pay | Admitting: Gerontology

## 2020-05-04 ENCOUNTER — Other Ambulatory Visit: Payer: Self-pay | Admitting: Gerontology

## 2020-05-04 VITALS — BP 147/91 | HR 66 | Resp 16 | Ht 68.0 in | Wt 197.2 lb

## 2020-05-04 DIAGNOSIS — E785 Hyperlipidemia, unspecified: Secondary | ICD-10-CM

## 2020-05-04 DIAGNOSIS — I10 Essential (primary) hypertension: Secondary | ICD-10-CM

## 2020-05-04 DIAGNOSIS — R7303 Prediabetes: Secondary | ICD-10-CM

## 2020-05-04 MED ORDER — PRAVASTATIN SODIUM 20 MG PO TABS: 20.0000 mg | ORAL_TABLET | Freq: Every day | ORAL | 2 refills | Status: DC

## 2020-05-04 NOTE — Patient Instructions (Signed)
Carbohydrate Counting for Diabetes Mellitus, Adult  Carbohydrate counting is a method of keeping track of how many carbohydrates you eat. Eating carbohydrates naturally increases the amount of sugar (glucose) in the blood. Counting how many carbohydrates you eat helps keep your blood glucose within normal limits, which helps you manage your diabetes (diabetes mellitus). It is important to know how many carbohydrates you can safely have in each meal. This is different for every person. A diet and nutrition specialist (registered dietitian) can help you make a meal plan and calculate how many carbohydrates you should have at each meal and snack. Carbohydrates are found in the following foods:  Grains, such as breads and cereals.  Dried beans and soy products.  Starchy vegetables, such as potatoes, peas, and corn.  Fruit and fruit juices.  Milk and yogurt.  Sweets and snack foods, such as cake, cookies, candy, chips, and soft drinks. How do I count carbohydrates? There are two ways to count carbohydrates in food. You can use either of the methods or a combination of both. Reading "Nutrition Facts" on packaged food The "Nutrition Facts" list is included on the labels of almost all packaged foods and beverages in the [place] It includes:  The serving size.  Information about nutrients in each serving, including the grams (g) of carbohydrate per serving. To use the "Nutrition Facts":  Decide how many servings you will have.  Multiply the number of servings by the number of carbohydrates per serving.  The resulting number is the total amount of carbohydrates that you will be having. Learning standard serving sizes of other foods When you eat carbohydrate foods that are not packaged or do not include "Nutrition Facts" on the label, you need to measure the servings in order to count the amount of carbohydrates:  Measure the foods that you will eat with a food scale or measuring cup, if needed.   Decide how many standard-size servings you will eat.  Multiply the number of servings by 15. Most carbohydrate-rich foods have about 15 g of carbohydrates per serving. ? For example, if you eat 8 oz (170 g) of strawberries, you will have eaten 2 servings and 30 g of carbohydrates (2 servings x 15 g = 30 g).  For foods that have more than one food mixed, such as soups and casseroles, you must count the carbohydrates in each food that is included. The following list contains standard serving sizes of common carbohydrate-rich foods. Each of these servings has about 15 g of carbohydrates:   hamburger bun or  English muffin.   oz (15 mL) syrup.   oz (14 g) jelly.  1 slice of bread.  1 six-inch tortilla.  3 oz (85 g) cooked rice or pasta.  4 oz (113 g) cooked dried beans.  4 oz (113 g) starchy vegetable, such as peas, corn, or potatoes.  4 oz (113 g) hot cereal.  4 oz (113 g) mashed potatoes or  of a large baked potato.  4 oz (113 g) canned or frozen fruit.  4 oz (120 mL) fruit juice.  4-6 crackers.  6 chicken nuggets.  6 oz (170 g) unsweetened dry cereal.  6 oz (170 g) plain fat-free yogurt or yogurt sweetened with artificial sweeteners.  8 oz (240 mL) milk.  8 oz (170 g) fresh fruit or one small piece of fruit.  24 oz (680 g) popped popcorn. Example of carbohydrate counting Sample meal  3 oz (85 g) chicken breast.  6 oz (170 g)   brown rice.  4 oz (113 g) corn.  8 oz (240 mL) milk.  8 oz (170 g) strawberries with sugar-free whipped topping. Carbohydrate calculation 1. Identify the foods that contain carbohydrates: ? Rice. ? Corn. ? Milk. ? Strawberries. 2. Calculate how many servings you have of each food: ? 2 servings rice. ? 1 serving corn. ? 1 serving milk. ? 1 serving strawberries. 3. Multiply each number of servings by 15 g: ? 2 servings rice x 15 g = 30 g. ? 1 serving corn x 15 g = 15 g. ? 1 serving milk x 15 g = 15 g. ? 1 serving  strawberries x 15 g = 15 g. 4. Add together all of the amounts to find the total grams of carbohydrates eaten: ? 30 g + 15 g + 15 g + 15 g = 75 g of carbohydrates total. Summary  Carbohydrate counting is a method of keeping track of how many carbohydrates you eat.  Eating carbohydrates naturally increases the amount of sugar (glucose) in the blood.  Counting how many carbohydrates you eat helps keep your blood glucose within normal limits, which helps you manage your diabetes.  A diet and nutrition specialist (registered dietitian) can help you make a meal plan and calculate how many carbohydrates you should have at each meal and snack. This information is not intended to replace advice given to you by your health care provider. Make sure you discuss any questions you have with your health care provider. Document Revised: 03/06/2017 Document Reviewed: 01/24/2016 Elsevier Patient Education  2020 [employer] DASH Eating Plan DASH stands for "Dietary Approaches to Stop Hypertension." The DASH eating plan is a healthy eating plan that has been shown to reduce high blood pressure (hypertension). It may also reduce your risk for type 2 diabetes, heart disease, and stroke. The DASH eating plan may also help with weight loss. What are tips for following this plan?  General guidelines  Avoid eating more than 2,300 mg (milligrams) of salt (sodium) a day. If you have hypertension, you may need to reduce your sodium intake to 1,500 mg a day.  Limit alcohol intake to no more than 1 drink a day for nonpregnant women and 2 drinks a day for men. One drink equals 12 oz of beer, 5 oz of wine, or 1 oz of hard liquor.  Work with your health care provider to maintain a healthy body weight or to lose weight. Ask what an ideal weight is for you.  Get at least 30 minutes of exercise that causes your heart to beat faster (aerobic exercise) most days of the week. Activities may include walking, swimming, or  biking.  Work with your health care provider or diet and nutrition specialist (dietitian) to adjust your eating plan to your individual calorie needs. Reading food labels   Check food labels for the amount of sodium per serving. Choose foods with less than 5 percent of the Daily Value of sodium. Generally, foods with less than 300 mg of sodium per serving fit into this eating plan.  To find whole grains, look for the word "whole" as the first word in the ingredient list. Shopping  Buy products labeled as "low-sodium" or "no salt added."  Buy fresh foods. Avoid canned foods and premade or frozen meals. Cooking  Avoid adding salt when cooking. Use salt-free seasonings or herbs instead of table salt or sea salt. Check with your health care provider or pharmacist before using salt substitutes.  Do not   fry foods. Cook foods using healthy methods such as baking, boiling, grilling, and broiling instead.  Cook with heart-healthy oils, such as olive, canola, soybean, or sunflower oil. Meal planning  Eat a balanced diet that includes: ? 5 or more servings of fruits and vegetables each day. At each meal, try to fill half of your plate with fruits and vegetables. ? Up to 6-8 servings of whole grains each day. ? Less than 6 oz of lean meat, poultry, or fish each day. A 3-oz serving of meat is about the same size as a deck of cards. One egg equals 1 oz. ? 2 servings of low-fat dairy each day. ? A serving of nuts, seeds, or beans 5 times each week. ? Heart-healthy fats. Healthy fats called Omega-3 fatty acids are found in foods such as flaxseeds and coldwater fish, like sardines, salmon, and mackerel.  Limit how much you eat of the following: ? Canned or prepackaged foods. ? Food that is high in trans fat, such as fried foods. ? Food that is high in saturated fat, such as fatty meat. ? Sweets, desserts, sugary drinks, and other foods with added sugar. ? Full-fat dairy products.  Do not salt  foods before eating.  Try to eat at least 2 vegetarian meals each week.  Eat more home-cooked food and less restaurant, buffet, and fast food.  When eating at a restaurant, ask that your food be prepared with less salt or no salt, if possible. What foods are recommended? The items listed may not be a complete list. Talk with your dietitian about what dietary choices are best for you. Grains Whole-grain or whole-wheat bread. Whole-grain or whole-wheat pasta. Brown rice. Oatmeal. Quinoa. Bulgur. Whole-grain and low-sodium cereals. Pita bread. Low-fat, low-sodium crackers. Whole-wheat flour tortillas. Vegetables Fresh or frozen vegetables (raw, steamed, roasted, or grilled). Low-sodium or reduced-sodium tomato and vegetable juice. Low-sodium or reduced-sodium tomato sauce and tomato paste. Low-sodium or reduced-sodium canned vegetables. Fruits All fresh, dried, or frozen fruit. Canned fruit in natural juice (without added sugar). Meat and other protein foods Skinless chicken or turkey. Ground chicken or turkey. Pork with fat trimmed off. Fish and seafood. Egg whites. Dried beans, peas, or lentils. Unsalted nuts, nut butters, and seeds. Unsalted canned beans. Lean cuts of beef with fat trimmed off. Low-sodium, lean deli meat. Dairy Low-fat (1%) or fat-free (skim) milk. Fat-free, low-fat, or reduced-fat cheeses. Nonfat, low-sodium ricotta or cottage cheese. Low-fat or nonfat yogurt. Low-fat, low-sodium cheese. Fats and oils Soft margarine without trans fats. Vegetable oil. Low-fat, reduced-fat, or light mayonnaise and salad dressings (reduced-sodium). Canola, safflower, olive, soybean, and sunflower oils. Avocado. Seasoning and other foods Herbs. Spices. Seasoning mixes without salt. Unsalted popcorn and pretzels. Fat-free sweets. What foods are not recommended? The items listed may not be a complete list. Talk with your dietitian about what dietary choices are best for you. Grains Baked goods  made with fat, such as croissants, muffins, or some breads. Dry pasta or rice meal packs. Vegetables Creamed or fried vegetables. Vegetables in a cheese sauce. Regular canned vegetables (not low-sodium or reduced-sodium). Regular canned tomato sauce and paste (not low-sodium or reduced-sodium). Regular tomato and vegetable juice (not low-sodium or reduced-sodium). Pickles. Olives. Fruits Canned fruit in a light or heavy syrup. Fried fruit. Fruit in cream or butter sauce. Meat and other protein foods Fatty cuts of meat. Ribs. Fried meat. Bacon. Sausage. Bologna and other processed lunch meats. Salami. Fatback. Hotdogs. Bratwurst. Salted nuts and seeds. Canned beans with added salt.   Canned or smoked fish. Whole eggs or egg yolks. Chicken or turkey with skin. Dairy Whole or 2% milk, cream, and half-and-half. Whole or full-fat cream cheese. Whole-fat or sweetened yogurt. Full-fat cheese. Nondairy creamers. Whipped toppings. Processed cheese and cheese spreads. Fats and oils Butter. Stick margarine. Lard. Shortening. Ghee. Bacon fat. Tropical oils, such as coconut, palm kernel, or palm oil. Seasoning and other foods Salted popcorn and pretzels. Onion salt, garlic salt, seasoned salt, table salt, and sea salt. Worcestershire sauce. Tartar sauce. Barbecue sauce. Teriyaki sauce. Soy sauce, including reduced-sodium. Steak sauce. Canned and packaged gravies. Fish sauce. Oyster sauce. Cocktail sauce. Horseradish that you find on the shelf. Ketchup. Mustard. Meat flavorings and tenderizers. Bouillon cubes. Hot sauce and Tabasco sauce. Premade or packaged marinades. Premade or packaged taco seasonings. Relishes. Regular salad dressings. Where to find more information:  National Heart, Lung, and Blood Institute: www.nhlbi.nih.gov  American Heart Association: www.heart.org Summary  The DASH eating plan is a healthy eating plan that has been shown to reduce high blood pressure (hypertension). It may also reduce  your risk for type 2 diabetes, heart disease, and stroke.  With the DASH eating plan, you should limit salt (sodium) intake to 2,300 mg a day. If you have hypertension, you may need to reduce your sodium intake to 1,500 mg a day.  When on the DASH eating plan, aim to eat more fresh fruits and vegetables, whole grains, lean proteins, low-fat dairy, and heart-healthy fats.  Work with your health care provider or diet and nutrition specialist (dietitian) to adjust your eating plan to your individual calorie needs. This information is not intended to replace advice given to you by your health care provider. Make sure you discuss any questions you have with your health care provider. Document Revised: 07/25/2017 Document Reviewed: 08/05/2016 Elsevier Patient Education  2020 Elsevier Inc.  

## 2020-05-04 NOTE — Progress Notes (Signed)
Established Patient Office Visit  Subjective:  Patient ID: Kevin [name], male    DOB: [birth year]  Age: 64 y.o. MRN: [id number]  CC:  Chief Complaint  Patient presents with  . Follow-up    HPI Kevin [name] presents for follow up of his hypertension and lab review. He reports medication compliance and is working to adhere to his DASH diet. He states that he checks his blood pressure every day. He does not log it, but states that it is usually in the 140s/90s. He reports smoking 3 cigarettes per day and is trying to cut down. Patient's A1C was found to be 5.8% and his LDL was found to be 107 on 04/27/20. He continues follow up at CHF clinic. States that he is feeling well and denies any additional complaints.   Past Medical History:  Diagnosis Date  . Chronic combined systolic (congestive) and diastolic (congestive) heart failure ([place])    a. 2019 reported neg stress test and nl cors on cath in [facility]; b. 07/2019 Echo: EF 25-30%, Gr2 DD, glob HK. Mod reduced RV fxn w/ volume overload. Mod dil RA. Mild to mod TR. Mod elev PASP.  Marland Kitchen COPD (chronic obstructive pulmonary disease) ([place])   . Diabetes mellitus without complication ([place])   . Hypertension   . Noncompliance   . Pleural effusion on right    a. 07/2019 Thoracentesis: 300 ml.    History reviewed. No pertinent surgical history.  History reviewed. No pertinent family history.  Social History   Socioeconomic History  . Marital status: Single    Spouse name: Not on file  . Number of children: 2  . Years of education: Not on file  . Highest education level: High school graduate  Occupational History  . Occupation: unemployed  Tobacco Use  . Smoking status: Current Some Day Smoker    Packs/day: 0.25    Years: 20.00    Pack years: 5.00    Types: Cigarettes  . Smokeless tobacco: Never Used  . Tobacco comment: a pack lasts a week (appx. 4 days)  Vaping Use  . Vaping Use: Never used  Substance and Sexual Activity  . Alcohol  use: Never  . Drug use: Not Currently  . Sexual activity: Not Currently  Other Topics Concern  . Not on file  Social History Narrative   Patient moved to [place] from [place] in March of this year, to take care of his Mother. She lives in a community managed by her church, "independent living for seniors." Patient lives with his Mother, does not drive, and reports he mostly keeps to himself.   Social Determinants of Health   Financial Resource Strain: Medium Risk  . Difficulty of Paying Living Expenses: Somewhat hard  Food Insecurity: Food Insecurity Present  . Worried About Charity fundraiser in the Last Year: Sometimes true  . Ran Out of Food in the Last Year: Sometimes true  Transportation Needs: Unmet Transportation Needs  . Lack of Transportation (Medical): Yes  . Lack of Transportation (Non-Medical): Yes  Physical Activity: Insufficiently Active  . Days of Exercise per Week: 2 days  . Minutes of Exercise per Session: 30 min  Stress: No Stress Concern Present  . Feeling of Stress : Not at all  Social Connections: Moderately Isolated  . Frequency of Communication with Friends and Family: Once a week  . Frequency of Social Gatherings with Friends and Family: Once a week  . Attends Religious Services: 1 to 4 times per year  .  Active Member of Clubs or Organizations: Yes  . Attends Archivist Meetings: Never  . Marital Status: Never married  Intimate Partner Violence: Not At Risk  . Fear of Current or Ex-Partner: No  . Emotionally Abused: No  . Physically Abused: No  . Sexually Abused: No    Outpatient Medications Prior to Visit  Medication Sig Dispense Refill  . albuterol (VENTOLIN HFA) 108 (90 Base) MCG/ACT inhaler Inhale 2 puffs into the lungs every 6 (six) hours as needed for wheezing or shortness of breath. 18 g 3  . amLODipine (NORVASC) 5 MG tablet Take 1 tablet (5 mg total) by mouth daily. 90 tablet 3  . Blood Pressure KIT 1 kit by Does not apply  route daily. 1 kit 0  . carvedilol (COREG) 25 MG tablet Take 1 tablet (25 mg total) by mouth 2 (two) times daily. 180 tablet 3  . fluticasone furoate-vilanterol (BREO ELLIPTA) 200-25 MCG/INH AEPB Inhale 1 puff into the lungs daily. 60 each 3  . furosemide (LASIX) 40 MG tablet Take 1 tablet (40 mg total) by mouth daily. 90 tablet 3  . gabapentin (NEURONTIN) 300 MG capsule Take 300 mg by mouth 2 (two) times daily.    Marland Kitchen spironolactone (ALDACTONE) 25 MG tablet Take 1 tablet (25 mg total) by mouth daily. 90 tablet 3  . tiotropium (SPIRIVA) 18 MCG inhalation capsule Place 1 capsule (18 mcg total) into inhaler and inhale daily. 30 capsule 3  . pravastatin (PRAVACHOL) 20 MG tablet Take 1 tablet (20 mg total) by mouth daily. 30 tablet 2  . sacubitril-valsartan (ENTRESTO) 97-103 MG Take 1 tablet by mouth 2 (two) times daily. (Patient not taking: Reported on 05/04/2020) 180 tablet 3  . naproxen (NAPROSYN) 500 MG tablet Take 1 tablet (500 mg total) by mouth 2 (two) times daily with a meal. (Patient not taking: Reported on 05/03/2020) 20 tablet 0   No facility-administered medications prior to visit.    No Known Allergies  ROS Review of Systems  Constitutional: Negative.  Negative for chills and fatigue.  Eyes: Negative for visual disturbance.  Respiratory: Negative.   Cardiovascular: Negative.  Negative for chest pain.  Endocrine: Negative for polydipsia, polyphagia and polyuria.  Musculoskeletal: Positive for gait problem.       Chronic d/t accident in 2018. Uses walker for balance.  Psychiatric/Behavioral: Negative.       Objective:    Physical Exam Constitutional:      Appearance: Normal appearance.  Eyes:     Pupils: Pupils are equal, round, and reactive to light.  Cardiovascular:     Rate and Rhythm: Normal rate and regular rhythm.     Heart sounds: Normal heart sounds.  Pulmonary:     Effort: Pulmonary effort is normal.     Breath sounds: Normal breath sounds.  Skin:    General: Skin  is warm and dry.  Neurological:     General: No focal deficit present.     Mental Status: He is alert.  Psychiatric:        Mood and Affect: Mood normal.     BP (!) 147/91 (BP Location: Left Arm, Patient Position: Sitting, Cuff Size: Large)   Pulse 66   Resp 16   Ht '5\' 8"'  (1.727 m)   Wt 197 lb 3.2 oz (89.4 kg)   SpO2 96%   BMI 29.98 kg/m  Wt Readings from Last 3 Encounters:  05/04/20 197 lb 3.2 oz (89.4 kg)  05/03/20 195 lb (88.5 kg)  04/27/20  194 lb 8 oz (88.2 kg)     Health Maintenance Due  Topic Date Due  . Hepatitis C Screening  Never done  . PNEUMOCOCCAL POLYSACCHARIDE VACCINE AGE 58-64 HIGH RISK  Never done  . FOOT EXAM  Never done  . OPHTHALMOLOGY EXAM  Never done  . URINE MICROALBUMIN  Never done  . TETANUS/TDAP  Never done  . COLONOSCOPY  Never done  . INFLUENZA VACCINE  Never done    There are no preventive care reminders to display for this patient.  No results found for: TSH Lab Results  Component Value Date   WBC 9.8 03/17/2020   HGB 14.6 03/17/2020   HCT 42.6 03/17/2020   MCV 95.9 03/17/2020   PLT 251 03/17/2020   Lab Results  Component Value Date   NA 144 04/27/2020   K 3.9 04/27/2020   CO2 26 04/27/2020   GLUCOSE 101 (H) 04/27/2020   BUN 12 04/27/2020   CREATININE 1.09 04/27/2020   BILITOT 0.7 04/27/2020   ALKPHOS 83 04/27/2020   AST 35 04/27/2020   ALT 30 04/27/2020   PROT 7.8 04/27/2020   ALBUMIN 4.3 04/27/2020   CALCIUM 9.1 04/27/2020   ANIONGAP 12 04/27/2020   Lab Results  Component Value Date   CHOL 167 04/27/2020   Lab Results  Component Value Date   HDL 44 04/27/2020   Lab Results  Component Value Date   LDLCALC 107 (H) 04/27/2020   Lab Results  Component Value Date   TRIG 81 04/27/2020   Lab Results  Component Value Date   CHOLHDL 3.8 04/27/2020   Lab Results  Component Value Date   HGBA1C 5.8 (H) 04/27/2020      Assessment & Plan:    1. Prediabetes Spoke with patient about diet modification and  resources provided. Patient verbalized understanding. He declines starting Metformin today. His goal A1c should be under 5.7% - HgB A1c; Future  2. Elevated lipids The 10-year ASCVD risk score Mikey Bussing DC Jr., et al., 2013) is: 49.7%   Values used to calculate the score:     Age: 66 years     Sex: Male     Is Non-Hispanic African American: Yes     Diabetic: Yes     Tobacco smoker: Yes     Systolic Blood Pressure: 606 mmHg     Is BP treated: Yes     HDL Cholesterol: 44 mg/dL     Total Cholesterol: 167 mg/dL  - pravastatin (PRAVACHOL) 20 MG tablet; Take 1 tablet (20 mg total) by mouth daily.  Dispense: 30 tablet; Refill: 2 -continue low fat/ low cholesterol diet  3. Essential hypertension His blood pressure is not under control, he will continue his current medications and treatment regimen. He has verbalized understanding to bring medications to his visits and log his blood pressure daily. His goal blood pressure is less than 130/80. He was encouraged to continue his DASH diet and exercise as tolerated.  -Blood pressure was 147/91 and heart rate was 66 in clinic.    Follow-up: Return in about 5 months (around 10/11/2020), or if symptoms worsen or fail to improve.    Azzie Glatter, RN, NP Student

## 2020-05-11 ENCOUNTER — Other Ambulatory Visit (HOSPITAL_COMMUNITY): Payer: Self-pay

## 2020-05-11 ENCOUNTER — Encounter (HOSPITAL_COMMUNITY): Payer: Self-pay

## 2020-05-11 NOTE — Progress Notes (Signed)
Had a home visit today with Kevin [name].  He states been doing well.  His bp is trending down.  It appears by his med boxes and med bottles he is taking his medications appropriately now.  Refilled his med box for 2 weeks.  He has all his medications.  He denies any problems today.  Will continue to visit for heart failure and medication compliance.   [name] [place] EMT-Paramedic [phone]

## 2020-05-24 ENCOUNTER — Other Ambulatory Visit (HOSPITAL_COMMUNITY): Payer: Self-pay

## 2020-05-24 NOTE — Progress Notes (Signed)
Today had a home visit with Kevin [name].  He states doing well.  He has been taking his medications according to the pill box and count on medication bottles.  He refilled last week box just to see if he could do it.  I checked behind him and he has it correct.  He said he just copied what I had in it.  He needs to get 3 medications from medication management in a week and half, explained he will need to go get that, he appeared to understand.  He denies any problems. He everything for daily living.  Has a little swelling in extremities.  He walks with walker sometimes and other times does ok.  He lives with his Mom and he helps take care of her.  They live in a 1 bedroom apartment, he sleeps on floor in living room.  Working on getting him a futon so he can have couch in daytime and bed a night.  Should be there next week.  He is always appreciative of anything.  He tries to watch high sodium foods and watches how much fluids he drinks.  He does not comprehend what may have high sodium in it, does not add salt to anything.  Will keep educating him on foods.  Will continue to visit for heart failure and medication management.   [name] [place] EMT-Paramedic [phone]

## 2020-05-26 NOTE — Progress Notes (Deleted)
Patient ID: Kevin [name], male    DOB: 1956-04-14, 64 y.o.   MRN: [id number]  HPI  Kevin [name] is a 64 y/o male with a history of HTN, COPD, tobacco use and chronic heart failure.   Echo report from 01/13/20 reviewed and showed an EF of 50-55% along with moderate LVH. Echo report from 08/04/2019 reviewed and showed an EF of 25-30% along with mild/moderate TR.   Was in the ED 03/17/20 due to vomiting after eating a lot of fruit. IVF given due to kidney function and he was released.    He presents today with a chief complaint of a follow-up visit.   Past Medical History:  Diagnosis Date  . Chronic combined systolic (congestive) and diastolic (congestive) heart failure ([facility])    a. 2019 reported neg stress test and nl cors on cath in [facility]; b. 07/2019 Echo: EF 25-30%, Gr2 DD, glob HK. Mod reduced RV fxn w/ volume overload. Mod dil RA. Mild to mod TR. Mod elev PASP.  Marland Kitchen COPD (chronic obstructive pulmonary disease) ([facility])   . Diabetes mellitus without complication ([facility])   . Hypertension   . Noncompliance   . Pleural effusion on right    a. 07/2019 Thoracentesis: 300 ml.    Social History   Tobacco Use  . Smoking status: Current Some Day Smoker    Packs/day: 0.25    Years: 20.00    Pack years: 5.00    Types: Cigarettes  . Smokeless tobacco: Never Used  . Tobacco comment: a pack lasts a week (appx. 4 days)  Substance Use Topics  . Alcohol use: Never   No Known Allergies    Review of Systems  Constitutional: Negative for appetite change and fatigue.  HENT: Negative for congestion, postnasal drip and sore throat.   Eyes: Negative.   Respiratory: Negative for cough, chest tightness and shortness of breath.   Cardiovascular: Negative for chest pain, palpitations and leg swelling.  Gastrointestinal: Negative for abdominal distention and abdominal pain.  Endocrine: Negative.   Genitourinary: Negative.   Musculoskeletal: Negative for back pain and neck pain.  Skin: Negative.    Allergic/Immunologic: Negative.   Neurological: Negative for dizziness and light-headedness.  Hematological: Negative for adenopathy. Does not bruise/bleed easily.  Psychiatric/Behavioral: Negative for dysphoric mood and sleep disturbance (sleeping on 1 pillows). The patient is not nervous/anxious.      Physical Exam Vitals and nursing note reviewed.  Constitutional:      Appearance: Normal appearance.  HENT:     Head: Normocephalic and atraumatic.  Neck:     Vascular: No carotid bruit.  Cardiovascular:     Rate and Rhythm: Normal rate and regular rhythm.  Pulmonary:     Effort: Pulmonary effort is normal. No respiratory distress.     Breath sounds: No wheezing or rales.  Abdominal:     General: Abdomen is flat. There is no distension.     Palpations: Abdomen is soft.  Musculoskeletal:        General: No tenderness.     Cervical back: Normal range of motion and neck supple.     Right lower leg: No tenderness. No edema.     Left lower leg: No tenderness. No edema.  Skin:    General: Skin is warm and dry.  Neurological:     General: No focal deficit present.     Mental Status: He is alert and oriented to person, place, and time.  Psychiatric:        Mood  and Affect: Mood normal.        Behavior: Behavior normal.    Assessment & Plan:  1: Chronic heart failure with now preserved ejection fraction along with structural changes- - NYHA class I - euvolemic today - weighing daily and reinforced to call for an overnight weight gain of >2 pounds or a weekly weight gain of >5 pounds - weight 194.8 from last visit here 1 month ago - not adding salt and has been trying to read food labels; reviewed the importance of closely following a 2000mg  sodium diet  - saw cardiology ([facility]) 01/14/20 - participating in paramedicine program - BNP 08/03/2019 was 2085.0 - patient says that he's received both COVID vaccines  2: HTN- - BP  - saw PCP at [facility] 05/04/20 - BMP on  04/27/20 reviewed and showed sodium 144, potassium 3.9, creatinine 1.09 and GFR >60  3: COPD-  - using nebulizer and inhalers - smoking 4 cigarettes daily   Medications reviewed.

## 2020-05-29 ENCOUNTER — Ambulatory Visit: Payer: Medicaid - Out of State | Admitting: Family

## 2020-06-02 ENCOUNTER — Encounter: Payer: Self-pay | Admitting: Family

## 2020-06-02 ENCOUNTER — Ambulatory Visit: Payer: Medicaid - Out of State | Attending: Family | Admitting: Family

## 2020-06-02 ENCOUNTER — Other Ambulatory Visit: Payer: Self-pay

## 2020-06-02 VITALS — BP 148/86 | HR 56 | Resp 18 | Ht 68.0 in | Wt 195.0 lb

## 2020-06-02 DIAGNOSIS — I5042 Chronic combined systolic (congestive) and diastolic (congestive) heart failure: Secondary | ICD-10-CM

## 2020-06-02 DIAGNOSIS — J449 Chronic obstructive pulmonary disease, unspecified: Secondary | ICD-10-CM | POA: Insufficient documentation

## 2020-06-02 DIAGNOSIS — F17210 Nicotine dependence, cigarettes, uncomplicated: Secondary | ICD-10-CM | POA: Insufficient documentation

## 2020-06-02 DIAGNOSIS — Z7951 Long term (current) use of inhaled steroids: Secondary | ICD-10-CM | POA: Insufficient documentation

## 2020-06-02 DIAGNOSIS — I10 Essential (primary) hypertension: Secondary | ICD-10-CM

## 2020-06-02 DIAGNOSIS — E119 Type 2 diabetes mellitus without complications: Secondary | ICD-10-CM | POA: Insufficient documentation

## 2020-06-02 DIAGNOSIS — Z79899 Other long term (current) drug therapy: Secondary | ICD-10-CM | POA: Insufficient documentation

## 2020-06-02 DIAGNOSIS — I110 Hypertensive heart disease with heart failure: Secondary | ICD-10-CM | POA: Insufficient documentation

## 2020-06-02 NOTE — Patient Instructions (Signed)
Continue weighing daily and call for an overnight weight gain of > 2 pounds or a weekly weight gain of >5 pounds. 

## 2020-06-02 NOTE — Progress Notes (Signed)
Patient ID: Kevin [name], male    DOB: [birth year], 64 y.o.   MRN: [id number]  HPI  Kevin [name] is a 64 y/o male with a history of HTN, COPD, tobacco use and chronic heart failure.   Echo report from 01/13/20 reviewed and showed an EF of 50-55% along with moderate LVH. Echo report from 08/04/2019 reviewed and showed an EF of 25-30% along with mild/moderate TR.   Was in the ED 03/17/20 due to vomiting after eating a lot of fruit. IVF given due to kidney function and he was released.    He presents today with a chief complaint of a follow-up visit. Currently doesn't have any symptoms and specifically denies any difficulty sleeping, abdominal distention, palpitations, pedal edema, chest pain, shortness of breath, dizziness, fatigue or weight gain.   Says that he just took his medications ~ 1/2 hour prior to coming to his appointment today.   Past Medical History:  Diagnosis Date  . Chronic combined systolic (congestive) and diastolic (congestive) heart failure ([place])    a. 2019 reported neg stress test and nl cors on cath in [facility]; b. 07/2019 Echo: EF 25-30%, Gr2 DD, glob HK. Mod reduced RV fxn w/ volume overload. Mod dil RA. Mild to mod TR. Mod elev PASP.  Marland Kitchen COPD (chronic obstructive pulmonary disease) ([place])   . Diabetes mellitus without complication ([place])   . Hypertension   . Noncompliance   . Pleural effusion on right    a. 07/2019 Thoracentesis: 300 ml.    Social History   Tobacco Use  . Smoking status: Current Some Day Smoker    Packs/day: 0.25    Years: 20.00    Pack years: 5.00    Types: Cigarettes  . Smokeless tobacco: Never Used  . Tobacco comment: a pack lasts a week (appx. 4 days)  Substance Use Topics  . Alcohol use: Never   No Known Allergies  Prior to Admission medications   Medication Sig Start Date End Date Taking? Authorizing Provider  albuterol (VENTOLIN HFA) 108 (90 Base) MCG/ACT inhaler Inhale 2 puffs into the lungs every 6 (six) hours as needed for wheezing or  shortness of breath. 09/08/19  Yes Isola Mehlman, Otila Kluver A, FNP  amLODipine (NORVASC) 5 MG tablet Take 1 tablet (5 mg total) by mouth daily. 04/27/20 07/26/20 Yes Eber Ferrufino, Otila Kluver A, FNP  Blood Pressure KIT 1 kit by Does not apply route daily. 12/23/19  Yes Iloabachie, Chioma E, NP  carvedilol (COREG) 25 MG tablet Take 1 tablet (25 mg total) by mouth 2 (two) times daily. 04/27/20  Yes Kamea Dacosta A, FNP  fluticasone furoate-vilanterol (BREO ELLIPTA) 200-25 MCG/INH AEPB Inhale 1 puff into the lungs daily. 09/08/19  Yes Darylene Price A, FNP  furosemide (LASIX) 40 MG tablet Take 1 tablet (40 mg total) by mouth daily. 04/27/20  Yes Morine Kohlman, Otila Kluver A, FNP  gabapentin (NEURONTIN) 300 MG capsule Take 300 mg by mouth 2 (two) times daily.   Yes [provider]  pravastatin (PRAVACHOL) 20 MG tablet Take 1 tablet (20 mg total) by mouth daily. 05/04/20  Yes Iloabachie, Chioma E, NP  sacubitril-valsartan (ENTRESTO) 97-103 MG Take 1 tablet by mouth 2 (two) times daily. 04/27/20  Yes Darylene Price A, FNP  spironolactone (ALDACTONE) 25 MG tablet Take 1 tablet (25 mg total) by mouth daily. 04/27/20  Yes Darylene Price A, FNP  tiotropium (SPIRIVA) 18 MCG inhalation capsule Place 1 capsule (18 mcg total) into inhaler and inhale daily. 12/23/19  Yes Iloabachie, Chioma E, NP  Review of Systems  Constitutional: Negative for appetite change and fatigue.  HENT: Negative for congestion, postnasal drip and sore throat.   Eyes: Negative.   Respiratory: Negative for cough, chest tightness and shortness of breath.   Cardiovascular: Negative for chest pain, palpitations and leg swelling.  Gastrointestinal: Negative for abdominal distention and abdominal pain.  Endocrine: Negative.   Genitourinary: Negative.   Musculoskeletal: Negative for back pain and neck pain.  Skin: Negative.   Allergic/Immunologic: Negative.   Neurological: Negative for dizziness and light-headedness.  Hematological: Negative for adenopathy. Does not bruise/bleed  easily.  Psychiatric/Behavioral: Negative for dysphoric mood and sleep disturbance (sleeping on 1 pillows). The patient is not nervous/anxious.    Vitals:   06/02/20 1055 06/02/20 1109  BP: (!) 165/102 (!) 148/86  Pulse: (!) 56   Resp: 18   SpO2: 100%   Weight: 195 lb (88.5 kg)   Height: _0  (1.727 m)    Wt Readings from Last 3 Encounters:  06/02/20 195 lb (88.5 kg)  05/24/20 196 lb (88.9 kg)  05/11/20 197 lb (89.4 kg)   Lab Results  Component Value Date   CREATININE 1.09 04/27/2020   CREATININE 2.19 (H) 03/17/2020   CREATININE 1.24 02/10/2020    Physical Exam Vitals and nursing note reviewed.  Constitutional:      Appearance: Normal appearance.  HENT:     Head: Normocephalic and atraumatic.  Neck:     Vascular: No carotid bruit.  Cardiovascular:     Rate and Rhythm: Normal rate and regular rhythm.  Pulmonary:     Effort: Pulmonary effort is normal. No respiratory distress.     Breath sounds: No wheezing or rales.  Abdominal:     General: Abdomen is flat. There is no distension.     Palpations: Abdomen is soft.  Musculoskeletal:        General: No tenderness.     Cervical back: Normal range of motion and neck supple.     Right lower leg: No tenderness. No edema.     Left lower leg: No tenderness. No edema.  Skin:    General: Skin is warm and dry.  Neurological:     General: No focal deficit present.     Mental Status: He is alert and oriented to person, place, and time.  Psychiatric:        Mood and Affect: Mood normal.        Behavior: Behavior normal.    Assessment & Plan:  1: Chronic heart failure with now preserved ejection fraction along with structural changes- - NYHA class I - euvolemic today - weighing daily and reinforced to call for an overnight weight gain of >2 pounds or a weekly weight gain of >5 pounds - weight stable from last visit here 1 month ago - not adding salt and has been trying to read food labels; reviewed the importance of  closely following a 2048m sodium diet  - saw cardiology ([facility]) 01/14/20 - participating in paramedicine program - BNP 08/03/2019 was 2085.0 - patient says that he's received both COVID vaccines  2: HTN- - BP improved upon recheck at the end of the visit; encouraged to take his medications a few hours before his appointments if possible - [facility] - BMP on 04/27/20 reviewed and showed sodium 144, potassium 3.9, creatinine 1.09 and GFR >60  3: COPD-  - using nebulizer and inhalers - smoking 4 cigarettes daily   Medication bottles reviewed.   Return  in 2 months or sooner for any questions/problems before then.

## 2020-06-09 ENCOUNTER — Other Ambulatory Visit: Payer: Self-pay | Admitting: Family

## 2020-06-09 MED ORDER — LOSARTAN POTASSIUM 100 MG PO TABS
100.0000 mg | ORAL_TABLET | Freq: Every day | ORAL | 3 refills | Status: DC
Start: 2020-06-09 — End: 2020-06-12

## 2020-06-10 ENCOUNTER — Other Ambulatory Visit: Payer: Self-pay

## 2020-06-10 ENCOUNTER — Ambulatory Visit: Payer: Medicaid - Out of State | Attending: Internal Medicine

## 2020-06-10 DIAGNOSIS — Z23 Encounter for immunization: Secondary | ICD-10-CM

## 2020-06-10 NOTE — Progress Notes (Signed)
   Covid-19 Vaccination Clinic  Name:  Kevin [name]    MRN: [id number] DOB: Dec 04, 1955  06/10/2020  [name] was observed post Covid-19 immunization for 15 minutes without incident. He was provided with Vaccine Information Sheet and instruction to access the V-Safe system.   [name] was instructed to call 911 with any severe reactions post vaccine: Marland Kitchen Difficulty breathing  . Swelling of face and throat  . A fast heartbeat  . A bad rash all over body  . Dizziness and weakness

## 2020-06-12 ENCOUNTER — Encounter: Payer: Self-pay | Admitting: Radiology

## 2020-06-12 ENCOUNTER — Observation Stay (HOSPITAL_BASED_OUTPATIENT_CLINIC_OR_DEPARTMENT_OTHER)
Admit: 2020-06-12 | Discharge: 2020-06-12 | Disposition: A | Discharge status: Home / Self Care | Payer: Self-pay | Attending: Physician Assistant | Admitting: Physician Assistant

## 2020-06-12 ENCOUNTER — Emergency Department: Payer: Self-pay

## 2020-06-12 ENCOUNTER — Observation Stay
Admission: EM | Admit: 2020-06-12 | Discharge: 2020-06-12 | Disposition: A | Discharge status: Home / Self Care | Payer: Self-pay | Attending: Family Medicine | Admitting: Family Medicine

## 2020-06-12 ENCOUNTER — Other Ambulatory Visit: Payer: Self-pay

## 2020-06-12 DIAGNOSIS — R778 Other specified abnormalities of plasma proteins: Secondary | ICD-10-CM

## 2020-06-12 DIAGNOSIS — I110 Hypertensive heart disease with heart failure: Secondary | ICD-10-CM | POA: Insufficient documentation

## 2020-06-12 DIAGNOSIS — E1159 Type 2 diabetes mellitus with other circulatory complications: Secondary | ICD-10-CM

## 2020-06-12 DIAGNOSIS — I5032 Chronic diastolic (congestive) heart failure: Secondary | ICD-10-CM

## 2020-06-12 DIAGNOSIS — I5042 Chronic combined systolic (congestive) and diastolic (congestive) heart failure: Secondary | ICD-10-CM | POA: Insufficient documentation

## 2020-06-12 DIAGNOSIS — R001 Bradycardia, unspecified: Secondary | ICD-10-CM

## 2020-06-12 DIAGNOSIS — N179 Acute kidney failure, unspecified: Secondary | ICD-10-CM

## 2020-06-12 DIAGNOSIS — R55 Syncope and collapse: Secondary | ICD-10-CM

## 2020-06-12 DIAGNOSIS — I10 Essential (primary) hypertension: Secondary | ICD-10-CM

## 2020-06-12 DIAGNOSIS — R7989 Other specified abnormal findings of blood chemistry: Secondary | ICD-10-CM

## 2020-06-12 DIAGNOSIS — I5040 Unspecified combined systolic (congestive) and diastolic (congestive) heart failure: Secondary | ICD-10-CM

## 2020-06-12 DIAGNOSIS — Z79899 Other long term (current) drug therapy: Secondary | ICD-10-CM | POA: Insufficient documentation

## 2020-06-12 DIAGNOSIS — F17210 Nicotine dependence, cigarettes, uncomplicated: Secondary | ICD-10-CM | POA: Insufficient documentation

## 2020-06-12 DIAGNOSIS — J449 Chronic obstructive pulmonary disease, unspecified: Secondary | ICD-10-CM

## 2020-06-12 DIAGNOSIS — Z20822 Contact with and (suspected) exposure to covid-19: Secondary | ICD-10-CM | POA: Insufficient documentation

## 2020-06-12 DIAGNOSIS — R42 Dizziness and giddiness: Secondary | ICD-10-CM

## 2020-06-12 DIAGNOSIS — E1140 Type 2 diabetes mellitus with diabetic neuropathy, unspecified: Secondary | ICD-10-CM | POA: Diagnosis present

## 2020-06-12 LAB — TROPONIN I (HIGH SENSITIVITY)
Troponin I (High Sensitivity): 31 ng/L — ABNORMAL HIGH (ref <18)
Troponin I (High Sensitivity): 19 ng/L — ABNORMAL HIGH (ref <18)

## 2020-06-12 LAB — COMPREHENSIVE METABOLIC PANEL
ALT: 24 U/L (ref 0–44)
AST: 29 U/L (ref 15–41)
Albumin: 4 g/dL (ref 3.5–5.0)
Alkaline Phosphatase: 71 U/L (ref 38–126)
Anion gap: 10 (ref 5–15)
BUN: 14 mg/dL (ref 8–23)
CO2: 25 mmol/L (ref 22–32)
Calcium: 9 mg/dL (ref 8.9–10.3)
Chloride: 106 mmol/L (ref 98–111)
Creatinine, Ser: 1.46 mg/dL — ABNORMAL HIGH (ref 0.61–1.24)
GFR, Estimated: 50 mL/min — ABNORMAL LOW (ref >60)
Glucose, Bld: 104 mg/dL — ABNORMAL HIGH (ref 70–99)
Potassium: 4.3 mmol/L (ref 3.5–5.1)
Sodium: 141 mmol/L (ref 135–145)
Total Bilirubin: 0.8 mg/dL (ref 0.3–1.2)
Total Protein: 7.3 g/dL (ref 6.5–8.1)

## 2020-06-12 LAB — RESPIRATORY PANEL BY RT PCR (FLU A&B, COVID)
Influenza A by PCR: NEGATIVE
Influenza B by PCR: NEGATIVE
SARS Coronavirus 2 by RT PCR: NEGATIVE

## 2020-06-12 LAB — CBC
HCT: 46.2 % (ref 39.0–52.0)
Hemoglobin: 15.3 g/dL (ref 13.0–17.0)
MCH: 32.8 pg (ref 26.0–34.0)
MCHC: 33.1 g/dL (ref 30.0–36.0)
MCV: 98.9 fL (ref 80.0–100.0)
Platelets: 204 10*3/uL (ref 150–400)
RBC: 4.67 MIL/uL (ref 4.22–5.81)
RDW: 13.5 % (ref 11.5–15.5)
WBC: 7.5 10*3/uL (ref 4.0–10.5)
nRBC: 0 % (ref 0.0–0.2)

## 2020-06-12 LAB — ECHOCARDIOGRAM LIMITED
AR max vel: 2.1 cm2
AV Area VTI: 2.52 cm2
AV Area mean vel: 1.91 cm2
AV Mean grad: 2 mmHg
AV Peak grad: 4.2 mmHg
Ao pk vel: 1.03 m/s
Area-P 1/2: 2.93 cm2
Calc EF: 52.4 %
S' Lateral: 2.59 cm
Single Plane A2C EF: 52.7 %
Single Plane A4C EF: 51.8 %

## 2020-06-12 LAB — TSH: TSH: 0.468 u[IU]/mL (ref 0.350–4.500)

## 2020-06-12 LAB — BRAIN NATRIURETIC PEPTIDE: B Natriuretic Peptide: 620 pg/mL — ABNORMAL HIGH (ref 0.0–100.0)

## 2020-06-12 MED ORDER — FUROSEMIDE 40 MG PO TABS: 20.0000 mg | ORAL_TABLET | Freq: Every day | ORAL | Status: DC

## 2020-06-12 MED ORDER — FUROSEMIDE 40 MG PO TABS
40.0000 mg | ORAL_TABLET | Freq: Every day | ORAL | Status: DC
  Administered 2020-06-12: 40 mg via ORAL
  Filled 2020-06-12: qty 1

## 2020-06-12 MED ORDER — FLUTICASONE FUROATE-VILANTEROL 200-25 MCG/INH IN AEPB
1.0000 | INHALATION_SPRAY | Freq: Every day | RESPIRATORY_TRACT | Status: DC
  Administered 2020-06-12: 12:00:00 1 via RESPIRATORY_TRACT
  Filled 2020-06-12: qty 28

## 2020-06-12 MED ORDER — SODIUM CHLORIDE 0.9% FLUSH
3.0000 mL | Freq: Two times a day (BID) | INTRAVENOUS | Status: DC
  Administered 2020-06-12: 3 mL via INTRAVENOUS

## 2020-06-12 MED ORDER — ACETAMINOPHEN 325 MG PO TABS: 650.0000 mg | ORAL_TABLET | Freq: Four times a day (QID) | ORAL | Status: DC | PRN

## 2020-06-12 MED ORDER — ALBUTEROL SULFATE HFA 108 (90 BASE) MCG/ACT IN AERS
2.0000 | INHALATION_SPRAY | Freq: Four times a day (QID) | RESPIRATORY_TRACT | Status: DC | PRN
  Filled 2020-06-12: qty 6.7

## 2020-06-12 MED ORDER — GABAPENTIN 300 MG PO CAPS
300.0000 mg | ORAL_CAPSULE | Freq: Two times a day (BID) | ORAL | Status: DC
  Administered 2020-06-12: 300 mg via ORAL
  Filled 2020-06-12: qty 1

## 2020-06-12 MED ORDER — AMLODIPINE BESYLATE 5 MG PO TABS
5.0000 mg | ORAL_TABLET | Freq: Every day | ORAL | Status: DC
  Administered 2020-06-12: 5 mg via ORAL
  Filled 2020-06-12: qty 1

## 2020-06-12 MED ORDER — ASPIRIN 81 MG PO CHEW
324.0000 mg | CHEWABLE_TABLET | Freq: Once | ORAL | Status: AC
  Administered 2020-06-12: 324 mg via ORAL
  Filled 2020-06-12: qty 4

## 2020-06-12 MED ORDER — TIOTROPIUM BROMIDE MONOHYDRATE 18 MCG IN CAPS
18.0000 ug | ORAL_CAPSULE | Freq: Every day | RESPIRATORY_TRACT | Status: DC
  Administered 2020-06-12: 18 ug via RESPIRATORY_TRACT
  Filled 2020-06-12: qty 5

## 2020-06-12 MED ORDER — SACUBITRIL-VALSARTAN 97-103 MG PO TABS
1.0000 | ORAL_TABLET | Freq: Two times a day (BID) | ORAL | Status: DC
  Administered 2020-06-12: 1 via ORAL
  Filled 2020-06-12 (×2): qty 1

## 2020-06-12 MED ORDER — ENOXAPARIN SODIUM 40 MG/0.4ML ~~LOC~~ SOLN
40.0000 mg | SUBCUTANEOUS | Status: DC
  Administered 2020-06-12: 40 mg via SUBCUTANEOUS
  Filled 2020-06-12: qty 0.4

## 2020-06-12 MED ORDER — ACETAMINOPHEN 650 MG RE SUPP: 650.0000 mg | Freq: Four times a day (QID) | RECTAL | Status: DC | PRN

## 2020-06-12 MED ORDER — PRAVASTATIN SODIUM 20 MG PO TABS
20.0000 mg | ORAL_TABLET | Freq: Every day | ORAL | Status: DC
  Filled 2020-06-12: qty 1

## 2020-06-12 MED ORDER — ONDANSETRON HCL 4 MG PO TABS: 4.0000 mg | ORAL_TABLET | Freq: Four times a day (QID) | ORAL | Status: DC | PRN

## 2020-06-12 MED ORDER — ONDANSETRON HCL 4 MG/2ML IJ SOLN: 4.0000 mg | Freq: Four times a day (QID) | INTRAMUSCULAR | Status: DC | PRN

## 2020-06-12 NOTE — H&P (Addendum)
History and Physical    Daruis Swaim OAC:[id number] DOB: [birth year] DOA: 06/12/2020  PCP: Langston Reusing, NP   Patient coming from: Home  I have personally briefly reviewed patient's old medical records in [place]  Chief Complaint: Dizziness  HPI: Kevin [name] is a 64 y.o. male with medical history significant for grade 2 diastolic heart failure, last EF 50 to 55% May 2021, COPD, hypertension and diabetes with history of NSVT during her past hospitalization, who was in his usual state of health until a few hours prior to arrival when he states he was sitting in his walker looking at TV when he suddenly started coughing and felt very lightheaded and sweaty and felt like he was about to pass out but did not.  He said he was previously doing well did not have a cough or shortness of breath or fever or chills.  He denied any associated chest pains, palpitations or shortness of breath with the episode.  Denied headache or visual disturbance or one-sided weakness numbness or tingling.  No recent GI or GU illness denied choking on food stating that the cough started suddenly.  He was back to normal following the episode.  He states his mother witnessed the episode and told him what happened. ED Course: On arrival was awake and alert.  Vitals were within normal limits however during his time in the emergency room his heart rate did go as low as 52.  CBC and CMP remarkable for creatinine of 1.46 above his baseline of 1.09.  Troponin 31.  EKG EKG as reviewed by me : Normal sinus rhythm at 57 with a left bundle branch block that appears new compared to EKG in February of this year.  Review of Systems: As per HPI otherwise all other systems on review of systems negative.    Past Medical History:  Diagnosis Date  . Chronic combined systolic (congestive) and diastolic (congestive) heart failure ([place])    a. 2019 reported neg stress test and nl cors on cath in [facility]; b. 07/2019 Echo: EF  25-30%, Gr2 DD, glob HK. Mod reduced RV fxn w/ volume overload. Mod dil RA. Mild to mod TR. Mod elev PASP.  Marland Kitchen COPD (chronic obstructive pulmonary disease) ([place])   . Diabetes mellitus without complication ([place])   . Hypertension   . Noncompliance   . Pleural effusion on right    a. 07/2019 Thoracentesis: 300 ml.    History reviewed. No pertinent surgical history.   reports that he has been smoking cigarettes. He has a 5.00 pack-year smoking history. He has never used smokeless tobacco. He reports previous drug use. He reports that he does not drink alcohol.  No Known Allergies  History reviewed. No pertinent family history.    Prior to Admission medications   Medication Sig Start Date End Date Taking? Authorizing Provider  albuterol (VENTOLIN HFA) 108 (90 Base) MCG/ACT inhaler Inhale 2 puffs into the lungs every 6 (six) hours as needed for wheezing or shortness of breath. 09/08/19  Yes Hackney, Otila Kluver A, FNP  amLODipine (NORVASC) 5 MG tablet Take 1 tablet (5 mg total) by mouth daily. 04/27/20 07/26/20 Yes Hackney, Otila Kluver A, FNP  carvedilol (COREG) 25 MG tablet Take 1 tablet (25 mg total) by mouth 2 (two) times daily. 04/27/20  Yes Hackney, Tina A, FNP  fluticasone furoate-vilanterol (BREO ELLIPTA) 200-25 MCG/INH AEPB Inhale 1 puff into the lungs daily. 09/08/19  Yes Darylene Price A, FNP  furosemide (LASIX) 40 MG tablet Take 1  tablet (40 mg total) by mouth daily. 04/27/20  Yes Hackney, Otila Kluver A, FNP  gabapentin (NEURONTIN) 300 MG capsule Take 300 mg by mouth 2 (two) times daily.   Yes [provider]  losartan (COZAAR) 100 MG tablet Take 1 tablet (100 mg total) by mouth daily. Until entresto is received 06/09/20 09/07/20 Yes Hackney, Otila Kluver A, FNP  pravastatin (PRAVACHOL) 20 MG tablet Take 1 tablet (20 mg total) by mouth daily. 05/04/20  Yes Iloabachie, Chioma E, NP  sacubitril-valsartan (ENTRESTO) 97-103 MG Take 1 tablet by mouth 2 (two) times daily. 04/27/20  Yes Darylene Price A, FNP  spironolactone  (ALDACTONE) 25 MG tablet Take 1 tablet (25 mg total) by mouth daily. 04/27/20  Yes Darylene Price A, FNP  tiotropium (SPIRIVA) 18 MCG inhalation capsule Place 1 capsule (18 mcg total) into inhaler and inhale daily. 12/23/19  Yes Iloabachie, Chioma E, NP  Blood Pressure KIT 1 kit by Does not apply route daily. 12/23/19   Langston Reusing, NP    Physical Exam: Vitals:   06/12/20 0024 06/12/20 0100 06/12/20 0200 06/12/20 0300  BP: 103/69 122/74 114/77 121/77  Resp: '18 18 17 14  ' Temp: 98.6 F (37 C)     TempSrc: Oral     SpO2: 96% 98% 97% 97%     Vitals:   06/12/20 0024 06/12/20 0100 06/12/20 0200 06/12/20 0300  BP: 103/69 122/74 114/77 121/77  Resp: '18 18 17 14  ' Temp: 98.6 F (37 C)     TempSrc: Oral     SpO2: 96% 98% 97% 97%      Constitutional: Alert and oriented x 3 . Not in any apparent distress HEENT:      Head: Normocephalic and atraumatic.         Eyes: PERLA, EOMI, Conjunctivae are normal. Sclera is non-icteric.       Mouth/Throat: Mucous membranes are moist.       Neck: Supple with no signs of meningismus. Cardiovascular: Regular rate and rhythm. No murmurs, gallops, or rubs. 2+ symmetrical distal pulses are present . No JVD. No LE edema Respiratory: Respiratory effort normal .Lungs sounds clear bilaterally. No wheezes, crackles, or rhonchi.  Gastrointestinal: Soft, non tender, and non distended with positive bowel sounds. No rebound or guarding. Genitourinary: No CVA tenderness. Musculoskeletal: Nontender with normal range of motion in all extremities. No cyanosis, or erythema of extremities. Neurologic:  Face is symmetric. Moving all extremities. No gross focal neurologic deficits . Skin: Skin is warm, dry.  No rash or ulcers Psychiatric: Mood and affect are normal    Labs on Admission: I have personally reviewed following labs and imaging studies  CBC: Recent Labs  Lab 06/12/20 0131  WBC 7.5  HGB 15.3  HCT 46.2  MCV 98.9  PLT 163   Basic Metabolic  Panel: Recent Labs  Lab 06/12/20 0131  NA 141  K 4.3  CL 106  CO2 25  GLUCOSE 104*  BUN 14  CREATININE 1.46*  CALCIUM 9.0   GFR: Estimated Creatinine Clearance: 56 mL/min (A) (by C-G formula based on SCr of 1.46 mg/dL (H)). Liver Function Tests: Recent Labs  Lab 06/12/20 0131  AST 29  ALT 24  ALKPHOS 71  BILITOT 0.8  PROT 7.3  ALBUMIN 4.0   No results for input(s): LIPASE, AMYLASE in the last 168 hours. No results for input(s): AMMONIA in the last 168 hours. Coagulation Profile: No results for input(s): INR, PROTIME in the last 168 hours. Cardiac Enzymes: No results for input(s): CKTOTAL,  CKMB, CKMBINDEX, TROPONINI in the last 168 hours. BNP (last 3 results) No results for input(s): PROBNP in the last 8760 hours. HbA1C: No results for input(s): HGBA1C in the last 72 hours. CBG: No results for input(s): GLUCAP in the last 168 hours. Lipid Profile: No results for input(s): CHOL, HDL, LDLCALC, TRIG, CHOLHDL, LDLDIRECT in the last 72 hours. Thyroid Function Tests: No results for input(s): TSH, T4TOTAL, FREET4, T3FREE, THYROIDAB in the last 72 hours. Anemia Panel: No results for input(s): VITAMINB12, FOLATE, FERRITIN, TIBC, IRON, RETICCTPCT in the last 72 hours. Urine analysis:    Component Value Date/Time   COLORURINE STRAW (A) 04/27/2020 1442   APPEARANCEUR CLEAR (A) 04/27/2020 1442   APPEARANCEUR Clear 12/15/2019 1226   LABSPEC 1.006 04/27/2020 1442   PHURINE 5.0 04/27/2020 1442   GLUCOSEU NEGATIVE 04/27/2020 1442   HGBUR NEGATIVE 04/27/2020 1442   BILIRUBINUR NEGATIVE 04/27/2020 1442   BILIRUBINUR Negative 12/15/2019 1226   KETONESUR NEGATIVE 04/27/2020 1442   PROTEINUR NEGATIVE 04/27/2020 1442   NITRITE NEGATIVE 04/27/2020 1442   LEUKOCYTESUR NEGATIVE 04/27/2020 1442    Radiological Exams on Admission: CT Head Wo Contrast  Result Date: 06/12/2020 CLINICAL DATA:  64 year old male with syncope. EXAM: CT HEAD WITHOUT CONTRAST TECHNIQUE: Contiguous axial  images were obtained from the base of the skull through the vertex without intravenous contrast. COMPARISON:  None. FINDINGS: Brain: The ventricles and sulci appropriate size for patient's age. Mild periventricular and deep white matter chronic microvascular ischemic changes noted. There is no acute intracranial hemorrhage. No mass effect or midline shift no extra-axial fluid collection. Vascular: No hyperdense vessel or unexpected calcification. Skull: Normal. Negative for fracture or focal lesion. Sinuses/Orbits: No acute finding. Other: None IMPRESSION: 1. No acute intracranial pathology. 2. Mild chronic microvascular ischemic changes. Electronically Signed   By: Anner Crete M.D.   On: 06/12/2020 01:20   DG Chest Portable 1 View  Result Date: 06/12/2020 CLINICAL DATA:  64 year old male with syncope. EXAM: PORTABLE CHEST 1 VIEW COMPARISON:  Chest radiograph dated 08/09/2019. FINDINGS: No focal consolidation, pleural effusion, pneumothorax. Stable mild cardiomegaly. Atherosclerotic calcification of the aorta. No acute osseous pathology. IMPRESSION: No active disease. Electronically Signed   By: Anner Crete M.D.   On: 06/12/2020 01:13     Assessment/Plan 64 year old male with history of grade 2 diastolic heart failure, last EF 50 to 55% May 2021, COPD, hypertension and diabetes with history of NSVT presenting with a presyncopal event    Postural dizziness with presyncope History of NSVT Sinus bradycardia -Patient presented with presyncopal episode with diaphoresis.  Asymptomatic sinus bradycardia while in ER -Continuous cardiac monitoring to evaluate for arrhythmia -Neurochecks -IV hydration    Elevated troponin -Troponin 31 and EKG showing new left bundle branch block -Continue to cycle troponins to evaluate for ACS -Patient denying chest pain    AKI (acute kidney injury) ([place]) -Creatinine 1.46 up from baseline of 1.09 -IV hydration    Type 2 diabetes, controlled, with  neuropathy (H[facility]) -Sliding scale insulin coverage    HTN (hypertension) -Continue home antihypertensives    COPD (chronic obstructive pulmonary disease) ([facility]) -Continue home meds    Chronic diastolic CHF (congestive heart failure) ([facility]) -Not acutely exacerbated -Monitor for fluid overload as patient being hydrated -Resume home meds    DVT prophylaxis: Lovenox  Code Status: full code  Family Communication:  none  Disposition Plan: Back to previous home environment Consults called: none  Status: Observation    Athena Masse MD [facility]  06/12/2020, 3:47 AM

## 2020-06-12 NOTE — ED Triage Notes (Signed)
Pt to ED via EMS for syncopal episode at home, witnessed by mom. Pt reports he had a coughing spell and then "everything went black." Diaphoretic per EMS. Denies cp, sob. SB on monitor, HR 50s. States this has happened one other occasion and was told he "over heated." Denies heavy work or hot conditions today. AO x4. Talking in full sentences with regular and unlabored breathing. BG 162 per EMS.

## 2020-06-12 NOTE — ED Notes (Signed)
Lab paged for second tropinin, RN attempted labs x2 and assisting RN

## 2020-06-12 NOTE — ED Notes (Signed)
Pt provided with warm blanket and lights dimmed for comfort. Denies needs or pain. States

## 2020-06-12 NOTE — Consult Note (Addendum)
Cardiology Consultation:   Patient ID: Kevin [name] MRN: [id number]; DOB: Feb 24, 1956  Admit date: 06/12/2020 Date of Consult: 06/12/2020  Primary Care Provider: Langston Reusing, NP Primary Cardiologist: Kate Sable, MD  Primary Electrophysiologist:  None    Patient Profile:   Kevin [name] is a 64 y.o. male with a hx of chronic combined systolic and diastolic heart failure (EF 25 to 30%   EF to 50 to 55% per 01/13/2020 echo), nonischemic cardiomyopathy with previous LHC showing no obstructive CAD (2019), known bradycardia, DM2, hypertension, current tobacco use (20+ years), COPD, and who is being seen today for the evaluation of syncope at the request of Dr. Bonner Puna.  History of Present Illness:   Mr. Shifflet is a 64 year old male with history as above.  Per EMR, he moved to [place] from [place].  He moved to [place] to take care of his mother, having previously lived in [place], [place].  He denies any family history of early cardiac death, heart disease, or arrhythmia.  He reportedly underwent LHC in 2019 without obstructive CAD.  He was admitted to [facility] 07/2019 after having trouble refilling his medications s/p moving from [place].  He had a right pleural effusion with thoracentesis, obtaining 300 mL fluid.  He was IV diuresed successfully with medications restarted.  He was last seen by his primary cardiologist, Dr. Garen Lah, 01/14/2020 for evaluation of NICM.  Echo was ordered at this visit and showed recovery of EF 12/2019 50 to 55% with 07/2019 EF 25 to 30%.  Estimated RVSP was 5.1 per documentation, though likely a typo, and likely 51.0 but will need confirmed.  EKG at that time showed sinus bradycardia with rate 54 bpm.  He was noted to be 198 pounds.  He was continued on Entresto 97/103, Coreg 25 mg twice daily, Aldactone 25 mg daily.  Lasix was decreased to 40 mg daily.  He was encouraged to complete daily weights and take  an extra dose of PRN Lasix if he gained 3 pounds in a day or 5 pounds in a week.  BP was noted to be elevated at the visit and 158/92.  He was started on amlodipine 5 mg daily.  Smoking cessation was advised.  Since his last visit, he notes that he has been tolerating amlodipine well.  He reports that he usually walks approximately 1 mile per day on his way to the store each day without anginal symptoms.  He has not experienced any chest pain, shortness of breath, or signs or symptoms of worsening heart failure.  He monitors his blood pressure at home with SBP 122 - 150 and DBP 80- 100.  He notes that his BP is usually 130s over 90s, as it has been well controlled since starting on his numerous medications.  He has occasional felt dizzy, though only if overheated, and states this usually alleviates with rest.  No recent or history of syncopal episodes.  No racing heart rate or palpitations with presyncope.  No recent signs or symptoms of bleeding.  No orthopnea, PND, or early satiety.  He has been monitoring daily weights and notes that his weight has been stable and around 195 pounds.  Of note, on review of office weights, it appears as if his weight has increased from his previous 185 pounds (09/09/2019)  198 pounds (01/14/2020). He also does admit to dietary noncompliance, frequently eating foods with salt though he knows that he should eat less salt.  He received his COVID-19 booster  immunization 06/10/20.  Last night (06/11/2020) at around 9 PM, he reports that he was helping to clean/bathe his mother when he started to feel very heated.  He usually knows to stop his activity when he starts to feel warm, so that he does not pass out; however, during this most recent episode, he pushed through Na feeling of becoming overheated.  He remembers that he was very diaphoretic and then started to feel dizzy.  No associated chest pain, racing heart rate, or SOB/DOE. The next thing he remembered, he heard his mother  calling his name, and he awoke at the foot of her bed.  He does not feel that he hit his head, though he does admit it is a possibility.  He is uncertain of the amount of time that he lost consciousness, but he notes that it likely was not prolonged based on his conversation with his mother.  He denies a syncopal episode in the past, given that he usually rests until his presyncope symptoms alleviate.  He reports eating [employer] twice that day, and wonders if this could be contributing to his most recent syncopal episode.  He presented to [facility] ED 06/12/20.  Per ED triage notes, he reported having a coughing spell prior to his episode of syncope; however, on cardiac consultation today, he denies coughing before his syncopal episode.  Initial vitals significant for BP 103/69, HR 52 bpm, 96% on room air.  Labs showed potassium 4.3, creatinine 1.46, BUN 14, high-sensitivity troponin 31  19, hemoglobin 15.3, hematocrit 46.2, respiratory panel negative, TSH 0.468.  CT head and CXR without acute finding. Today, he denies further episodes of presyncope or syncope since that reported the evening of 10/17.  Heart Pathway Score:     Past Medical History:  Diagnosis Date  . Chronic combined systolic (congestive) and diastolic (congestive) heart failure ([place])    a. 2019 reported neg stress test and nl cors on cath in [facility]; b. 07/2019 Echo: EF 25-30%, Gr2 DD, glob HK. Mod reduced RV fxn w/ volume overload. Mod dil RA. Mild to mod TR. Mod elev PASP.  Marland Kitchen COPD (chronic obstructive pulmonary disease) ([place])   . Diabetes mellitus without complication ([place])   . Hypertension   . Noncompliance   . Pleural effusion on right    a. 07/2019 Thoracentesis: 300 ml.    History reviewed. No pertinent surgical history.   Home Medications:  Prior to Admission medications   Medication Sig Start Date End Date Taking? Authorizing Provider  albuterol (VENTOLIN HFA) 108 (90 Base) MCG/ACT inhaler Inhale 2 puffs into the lungs every 6  (six) hours as needed for wheezing or shortness of breath. 09/08/19  Yes Hackney, Otila Kluver A, FNP  amLODipine (NORVASC) 5 MG tablet Take 1 tablet (5 mg total) by mouth daily. 04/27/20 07/26/20 Yes Hackney, Otila Kluver A, FNP  carvedilol (COREG) 25 MG tablet Take 1 tablet (25 mg total) by mouth 2 (two) times daily. 04/27/20  Yes Hackney, Tina A, FNP  fluticasone furoate-vilanterol (BREO ELLIPTA) 200-25 MCG/INH AEPB Inhale 1 puff into the lungs daily. 09/08/19  Yes Darylene Price A, FNP  furosemide (LASIX) 40 MG tablet Take 1 tablet (40 mg total) by mouth daily. 04/27/20  Yes Hackney, Otila Kluver A, FNP  gabapentin (NEURONTIN) 300 MG capsule Take 300 mg by mouth 2 (two) times daily.   Yes [provider]  losartan (COZAAR) 100 MG tablet Take 1 tablet (100 mg total) by mouth daily. Until entresto is received 06/09/20 09/07/20 Yes Stryker, R[place]  A, FNP  pravastatin (PRAVACHOL) 20 MG tablet Take 1 tablet (20 mg total) by mouth daily. 05/04/20  Yes Iloabachie, Chioma E, NP  sacubitril-valsartan (ENTRESTO) 97-103 MG Take 1 tablet by mouth 2 (two) times daily. 04/27/20  Yes Darylene Price A, FNP  spironolactone (ALDACTONE) 25 MG tablet Take 1 tablet (25 mg total) by mouth daily. 04/27/20  Yes Darylene Price A, FNP  tiotropium (SPIRIVA) 18 MCG inhalation capsule Place 1 capsule (18 mcg total) into inhaler and inhale daily. 12/23/19  Yes Iloabachie, Chioma E, NP  Blood Pressure KIT 1 kit by Does not apply route daily. 12/23/19   Iloabachie, Chioma E, NP    Inpatient Medications: Scheduled Meds: . enoxaparin (LOVENOX) injection  40 mg Subcutaneous Q24H  . fluticasone furoate-vilanterol  1 puff Inhalation Daily  . gabapentin  300 mg Oral BID  . pravastatin  20 mg Oral Daily  . sacubitril-valsartan  1 tablet Oral BID  . sodium chloride flush  3 mL Intravenous Q12H  . tiotropium  18 mcg Inhalation Daily   Continuous Infusions:  PRN Meds: acetaminophen **OR** acetaminophen, albuterol, ondansetron **OR** ondansetron (ZOFRAN)  IV  Allergies:   No Known Allergies  Social History:   Social History   Socioeconomic History  . Marital status: Single    Spouse name: Not on file  . Number of children: 2  . Years of education: Not on file  . Highest education level: High school graduate  Occupational History  . Occupation: unemployed  Tobacco Use  . Smoking status: Current Some Day Smoker    Packs/day: 0.25    Years: 20.00    Pack years: 5.00    Types: Cigarettes  . Smokeless tobacco: Never Used  . Tobacco comment: a pack lasts a week (appx. 4 days)  Vaping Use  . Vaping Use: Never used  Substance and Sexual Activity  . Alcohol use: Never  . Drug use: Not Currently  . Sexual activity: Not Currently  Other Topics Concern  . Not on file  Social History Narrative   Patient moved to [place] from [place] in March of this year, to take care of his Mother. She lives in a community managed by her church, "independent living for seniors." Patient lives with his Mother, does not drive, and reports he mostly keeps to himself.   Social Determinants of Health   Financial Resource Strain: Medium Risk  . Difficulty of Paying Living Expenses: Somewhat hard  Food Insecurity: Food Insecurity Present  . Worried About Charity fundraiser in the Last Year: Sometimes true  . Ran Out of Food in the Last Year: Sometimes true  Transportation Needs: Unmet Transportation Needs  . Lack of Transportation (Medical): Yes  . Lack of Transportation (Non-Medical): Yes  Physical Activity: Insufficiently Active  . Days of Exercise per Week: 2 days  . Minutes of Exercise per Session: 30 min  Stress: No Stress Concern Present  . Feeling of Stress : Not at all  Social Connections: Moderately Isolated  . Frequency of Communication with Friends and Family: Once a week  . Frequency of Social Gatherings with Friends and Family: Once a week  . Attends Religious Services: 1 to 4 times per year  . Active Member of Clubs or  Organizations: Yes  . Attends Archivist Meetings: Never  . Marital Status: Never married  Intimate Partner Violence: Not At Risk  . Fear of Current or Ex-Partner: No  . Emotionally Abused: No  . Physically Abused: No  .  Sexually Abused: No    Family History:   History reviewed. No pertinent family history.  He denies any family history of early cardiac death, heart failure, or arrhythmia.   ROS:  Please see the history of present illness.  Review of Systems  Constitutional: Positive for diaphoresis. Negative for malaise/fatigue.       Diaphoresis associated with syncope  Respiratory: Negative for cough, hemoptysis and shortness of breath.        Denies any recent episodes of coughing.  Cardiovascular: Negative for chest pain, palpitations, orthopnea, claudication, leg swelling and PND.       Reports past lower extremity edema that has not recurred since started on Lasix.  Gastrointestinal: Negative for blood in stool, constipation, melena and vomiting.  Genitourinary: Negative for hematuria.  Musculoskeletal: Negative for falls.  Neurological: Positive for dizziness and loss of consciousness. Negative for focal weakness.  All other systems reviewed and are negative.   All other ROS reviewed and negative.     Physical Exam/Data:   Vitals:   06/12/20 0400 06/12/20 0500 06/12/20 0600 06/12/20 0804  BP: 122/86 134/77 133/89 137/90  Pulse: (!) 52 (!) 55 (!) 53 (!) 53  Resp: 17 18 13 16  Temp:    98.1 F (36.7 C)  TempSrc:    Oral  SpO2: 100% 100% 100% 100%   No intake or output data in the 24 hours ending 06/12/20 1036 Last 3 Weights 06/02/2020 05/24/2020 05/11/2020  Weight (lbs) 195 lb 196 lb 197 lb  Weight (kg) 88.451 kg 88.905 kg 89.359 kg     There is no height or weight on file to calculate BMI.  General:  Well nourished, well developed, in no acute distress HEENT: normal Neck: no JVD Vascular: No carotid bruits; radial pulses 2+ bilaterally Cardiac:   normal S1, S2; bradycardic but regular; no murmur Lungs:  Trace bibasilar crackles Abd: soft, nontender, no hepatomegaly  Ext: no edema Musculoskeletal:  No deformities, BUE and BLE strength normal and equal Skin: warm and dry  Neuro:  No focal abnormalities noted Psych:  Normal affect   EKG:  The EKG was personally reviewed and demonstrates:  Sinus bradycardia, 54 bpm, possible prior anterior infarct not seen on previous EKG versus lead placement, LVH, LBBB with QRS 127 ms (seen on previous EKGs), new T wave inversion noted in inferior leads II, III, aVF, previously noted TWI V5, V6  Telemetry:  Telemetry was personally reviewed and demonstrates:  SB  Relevant CV Studies: Echocardiogram 01/13/2020 1. Left ventricular ejection fraction, by estimation, is 50 to 55%. The  left ventricle has low normal function. The left ventricle has no regional  wall motion abnormalities. There is moderate concentric left ventricular  hypertrophy. Left ventricular  diastolic parameters are consistent with Grade II diastolic dysfunction  (pseudonormalization).  2. Right ventricular systolic function is normal. The right ventricular  size is normal. There is normal pulmonary artery systolic pressure.  3. The mitral valve is normal in structure. No evidence of mitral valve  regurgitation.  4. The aortic valve is normal in structure. Aortic valve regurgitation is  not visualized.  5. The inferior vena cava is normal in size with greater than 50%  respiratory variability, suggesting right atrial pressure of 3 mmHg.    Laboratory Data:  High Sensitivity Troponin:   Recent Labs  Lab 06/12/20 0131 06/12/20 0908  TROPONINIHS 31* 19*     Cardiac EnzymesNo results for input(s): TROPONINI in the last 168 hours. No results for input(s):   TROPIPOC in the last 168 hours.  Chemistry Recent Labs  Lab 06/12/20 0131  NA 141  K 4.3  CL 106  CO2 25  GLUCOSE 104*  BUN 14  CREATININE 1.46*  CALCIUM 9.0   GFRNONAA 50*  ANIONGAP 10    Recent Labs  Lab 06/12/20 0131  PROT 7.3  ALBUMIN 4.0  AST 29  ALT 24  ALKPHOS 71  BILITOT 0.8   Hematology Recent Labs  Lab 06/12/20 0131  WBC 7.5  RBC 4.67  HGB 15.3  HCT 46.2  MCV 98.9  MCH 32.8  MCHC 33.1  RDW 13.5  PLT 204   BNPNo results for input(s): BNP, PROBNP in the last 168 hours.  DDimer No results for input(s): DDIMER in the last 168 hours.   Radiology/Studies:  CT Head Wo Contrast  Result Date: 06/12/2020 CLINICAL DATA:  64-year-old male with syncope. EXAM: CT HEAD WITHOUT CONTRAST TECHNIQUE: Contiguous axial images were obtained from the base of the skull through the vertex without intravenous contrast. COMPARISON:  None. FINDINGS: Brain: The ventricles and sulci appropriate size for patient's age. Mild periventricular and deep white matter chronic microvascular ischemic changes noted. There is no acute intracranial hemorrhage. No mass effect or midline shift no extra-axial fluid collection. Vascular: No hyperdense vessel or unexpected calcification. Skull: Normal. Negative for fracture or focal lesion. Sinuses/Orbits: No acute finding. Other: None IMPRESSION: 1. No acute intracranial pathology. 2. Mild chronic microvascular ischemic changes. Electronically Signed   By: Arash  Radparvar M.D.   On: 06/12/2020 01:20   DG Chest Portable 1 View  Result Date: 06/12/2020 CLINICAL DATA:  64-year-old male with syncope. EXAM: PORTABLE CHEST 1 VIEW COMPARISON:  Chest radiograph dated 08/09/2019. FINDINGS: No focal consolidation, pleural effusion, pneumothorax. Stable mild cardiomegaly. Atherosclerotic calcification of the aorta. No acute osseous pathology. IMPRESSION: No active disease. Electronically Signed   By: Arash  Radparvar M.D.   On: 06/12/2020 01:13    Assessment and Plan:   Syncope --No further episodes of presyncope or syncope since 10/17. No chest pain.  Denies racing heart rate or palpitations.  Episode of syncope 10/17  while bathing his mother and associated with feeling very warm / diaphoresis and dizziness.  He denies having an episode like this before in the past but has felt dizzy when overheated before.  He denies any coughing prior to the episode.  12/2019 recovery of EF 50 to 55% and RVSP 51.0 (assuming RVSP and echo report is a typo).  Consider syncopal episode 2/2 volume overload and exacerbation of chronic combined heart failure 2/2 dietary noncompliance and recommendations as below.  Also considered is syncope 2/2 vasovagal etiology, given report of being overheated prior to episode. Limited echo ordered to reassess EF and rule out syncope due to acute EF or structural changes.  If EF reduced, consider further ischemic work-up to rule out syncope due to acute cardiac event. Continue to monitor on telemetry to rule out syncope 2/2 arrhythmia.  Consider discharge with ZIO monitor to ensure no evidence of arrhythmia if not captured on telemetry and workup as above unrevealing. Continue current medications.   Chronic combined systolic and diastolic heart failure NICM --Denies shortness of breath or any signs or symptoms of worsening heart failure.  Moved from [place] to the [place] area in spring 2020.  Previous EF of 25 to 30% with global hypokinesis recovered 12/2019 with EF low normal 50 to 55% and elevated RVSP as above.  His weight is stable from previous clinic visits but overall   elevated from his prior admission. CXR without acute dz despite crackles appreciated on exam and suspect volume overload - consider volume overload 2/2 IVF received. Recommend restart of PTA oral lasix in this setting with close monitoring of renal function. Recommend ReDS vest to establish baseline. Ordered BNP to trend during admission.  Recommend daily weights, close monitoring of I's/O's.  No daily weights or I's/O's yet recorded.  Elevated high-sensitivity troponin without chest pain --No chest pain.  Syncopal episode as above  and associated with diaphoresis. He does have a previous history of 2019 cath with nonobstructive disease.  Previous echo with low normal EF as above. --EKG with known LBBB and bradycardia, as well as new persistent TWI in inferior leads.  --HS Tn 31  19 and down-trending.  --Considered is elevated HS Tn 2/2 supply demand ischemia. Risk factors for acute cardiac ischemia include current history of smoking and elevated LDL. Given EKG changes above, ordered repeat limited echo.  If EF reduced, recommend further ischemic work-up at that time. Continue current medications. Further recommendations pending echo.   Bradycardia --Known history of bradycardia. No evidence of heart block on telemetry.  He reports usually asymptomatic with his bradycardia.  Continue to monitor on telemetry.  As above, if work-up this admission relatively unrevealing, consider discharge with CO monitor.  HTN -BP soft at presentation and currently 130/85 and borderline.  He has been restarted on his PTA medications but not yet received them, which will likely result in improvement in his BP. Recommend restart of low dose amlodipine as tolerated. Recommend BP control 130/80 or lower as heart rate allows.  CKD --History of Cr 1.0-1.2. Cr slightly elevated at presentation. Continue to monitor.   Tobacco use --Cessation advised.  For questions or updates, please contact [facility] Please consult [email or web address] for contact info under     Signed,  D , PA-C  06/12/2020 10:36 AM   

## 2020-06-12 NOTE — Progress Notes (Signed)
*  PRELIMINARY RESULTS* Echocardiogram 2D Echocardiogram has been performed.  [name] 06/12/2020, 1:43 PM

## 2020-06-12 NOTE — Discharge Summary (Signed)
Physician Discharge Summary  Devarius Nelles DHR:[id number] DOB: 03/10/1956 DOA: 06/12/2020  PCP: Langston Reusing, NP  Admit date: 06/12/2020 Discharge date: 06/12/2020  Admitted From: Home Disposition: Home   Recommendations for Outpatient Follow-up:  1. Follow up with PCP in 1-2 weeks 2. Please obtain BMP/CBC in one week 3. Follow up with cardiology. If symptoms recur (syncope), consider cardiac monitoring.  Home Health: None Equipment/Devices: None Discharge Condition: Stable CODE STATUS: Full Diet recommendation: Heart healthy  Brief/Interim Summary: Kevin [name] is a 64 year old male with known history of chronic systolic heart failure likely due to hypertensive heart disease.  Previous ejection fraction was 25 to 30% but improved to 50% on most recent catheterization.  Previous left heart catheterization in 2019 showed no obstructive disease. 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 Got the covid-19 vaccine 10/16 and on 10/17 he was helping to clean and bathe her. He had to heat the room significantly to keep his mother warm.  The patient started feeling hot and became sick on his stomach overall followed by dizziness and a brief loss of consciousness.  He did not have any palpitations, chest pain or shortness of breath.  He presented to the ED just after midnight 10/18 was found to have ECG showing NSR, LVH, IVCD, repolarization abnormalities. Creatinine slightly above baseline and he appeared modestly dehydrated. He was placed in observation and cardiology was consulted. Echocardiogram was reviewed by Dr. Fletcher Anon who feels the patient's presentation's etiology is likely vasovagal and precipitated by volume depletion. Lasix dose reduction is recommended and the patient is cleared for discharge having had no recurrence of symptoms.  Discharge Diagnoses:  Active Problems:   Type 2 diabetes, controlled, with neuropathy (HCC)   HTN (hypertension)   COPD (chronic obstructive  pulmonary disease) (HCC)   Chronic diastolic CHF (congestive heart failure) (HCC)   Elevated troponin   AKI (acute kidney injury) (HCC)   Postural dizziness with presyncope  Discharge Instructions Discharge Instructions    Diet - low sodium heart healthy   Complete by: As directed    Discharge instructions   Complete by: As directed    You were evaluated for an episode of passing out that was most likely due to a vasovagal response. It is not likely to be related to a cardiac condition, and the cardiologist has cleared you for discharge today. You had evidence of being slightly hypovolemic (dehydrated) so they recommend that you decrease the dose of lasix to 26m daily (instead of 434m. You can split your current pills in half to take.   Please follow up with your PCP in the next 1-2 weeks and schedule cardiology follow up in the next 2-4 weeks. You should have labs rechecked at your follow up appointment. If your symptoms recur, seek medical attention right away.   Increase activity slowly   Complete by: As directed      Allergies as of 06/12/2020   No Known Allergies     Medication List    STOP taking these medications   losartan 100 MG tablet Commonly known as: COZAAR     TAKE these medications   albuterol 108 (90 Base) MCG/ACT inhaler Commonly known as: VENTOLIN HFA Inhale 2 puffs into the lungs every 6 (six) hours as needed for wheezing or shortness of breath.   amLODipine 5 MG tablet Commonly known as: NORVASC Take 1 tablet (5 mg total) by mouth daily.   Blood Pressure Kit 1 kit by Does not apply route daily.  Breo Ellipta 200-25 MCG/INH Aepb Generic drug: fluticasone furoate-vilanterol Inhale 1 puff into the lungs daily.   carvedilol 25 MG tablet Commonly known as: COREG Take 1 tablet (25 mg total) by mouth 2 (two) times daily.   furosemide 40 MG tablet Commonly known as: LASIX Take 0.5 tablets (20 mg total) by mouth daily. What changed: how much to  take   gabapentin 300 MG capsule Commonly known as: NEURONTIN Take 300 mg by mouth 2 (two) times daily.   pravastatin 20 MG tablet Commonly known as: PRAVACHOL Take 1 tablet (20 mg total) by mouth daily.   sacubitril-valsartan 97-103 MG Commonly known as: ENTRESTO Take 1 tablet by mouth 2 (two) times daily.   spironolactone 25 MG tablet Commonly known as: ALDACTONE Take 1 tablet (25 mg total) by mouth daily.   tiotropium 18 MCG inhalation capsule Commonly known as: SPIRIVA Place 1 capsule (18 mcg total) into inhaler and inhale daily.       Follow-up Information    Iloabachie, Chioma E, NP. Schedule an appointment as soon as possible for a visit.   Specialty: Gerontology Contact information: [place] [address] [phone]        Kate Sable, MD. Schedule an appointment as soon as possible for a visit.   Specialties: Cardiology, Radiology Contact information: [place] [address] [phone]              No Known Allergies  Consultations:  Cardiology  Procedures/Studies: CT Head Wo Contrast  Result Date: 06/12/2020 CLINICAL DATA:  64 year old male with syncope. EXAM: CT HEAD WITHOUT CONTRAST TECHNIQUE: Contiguous axial images were obtained from the base of the skull through the vertex without intravenous contrast. COMPARISON:  None. FINDINGS: Brain: The ventricles and sulci appropriate size for patient's age. Mild periventricular and deep white matter chronic microvascular ischemic changes noted. There is no acute intracranial hemorrhage. No mass effect or midline shift no extra-axial fluid collection. Vascular: No hyperdense vessel or unexpected calcification. Skull: Normal. Negative for fracture or focal lesion. Sinuses/Orbits: No acute finding. Other: None IMPRESSION: 1. No acute intracranial pathology. 2. Mild chronic microvascular ischemic changes. Electronically Signed   By: Anner Crete  M.D.   On: 06/12/2020 01:20   DG Chest Portable 1 View  Result Date: 06/12/2020 CLINICAL DATA:  64 year old male with syncope. EXAM: PORTABLE CHEST 1 VIEW COMPARISON:  Chest radiograph dated 08/09/2019. FINDINGS: No focal consolidation, pleural effusion, pneumothorax. Stable mild cardiomegaly. Atherosclerotic calcification of the aorta. No acute osseous pathology. IMPRESSION: No active disease. Electronically Signed   By: Anner Crete M.D.   On: 06/12/2020 01:13   ECHOCARDIOGRAM LIMITED  Result Date: 06/12/2020    ECHOCARDIOGRAM LIMITED REPORT   Patient Name:   ESTEBAN KOBASHIGAWA Date of Exam: 06/12/2020 Medical Rec #:  [id number]   Height:       68.0 in Accession #:    [id number]  Weight:       195.0 lb Date of Birth:  1956/07/30    BSA:          2.022 m Patient Age:    67 years    BP:           128/83 mmHg Patient Gender: M           HR:           59 bpm. Exam Location:  [facility] Procedure: Cardiac Doppler, Color Doppler, Limited Echo and 2D Echo Indications:     Syncope  780.2  History:         Patient has prior history of Echocardiogram examinations, most                  recent 01/13/2020. COPD; Risk Factors:Hypertension and Diabetes.  Sonographer:     Sherrie Sport RDCS (AE) Referring Phys:  [id number] Arvil Chaco Diagnosing Phys: Kathlyn Sacramento MD IMPRESSIONS  1. Left ventricular ejection fraction, by estimation, is 45 to 50%. The left ventricle has mildly decreased function. The left ventricle demonstrates global hypokinesis. There is severe left ventricular hypertrophy. Left ventricular diastolic parameters  are consistent with Grade II diastolic dysfunction (pseudonormalization).  2. Right ventricular systolic function is normal. The right ventricular size is normal. Tricuspid regurgitation signal is inadequate for assessing PA pressure.  3. Left atrial size was mildly dilated.  4. Right atrial size was mildly dilated.  5. The mitral valve is normal in structure. No evidence of mitral valve regurgitation.  No evidence of mitral stenosis.  6. The aortic valve is normal in structure. Aortic valve regurgitation is not visualized. Mild aortic valve sclerosis is present, with no evidence of aortic valve stenosis.  7. Aortic dilatation noted. There is mild dilatation of the aortic root, measuring 39 mm. FINDINGS  Left Ventricle: Left ventricular ejection fraction, by estimation, is 45 to 50%. The left ventricle has mildly decreased function. The left ventricle demonstrates global hypokinesis. The left ventricular internal cavity size was normal in size. There is  severe left ventricular hypertrophy. Left ventricular diastolic parameters are consistent with Grade II diastolic dysfunction (pseudonormalization). Right Ventricle: The right ventricular size is normal. No increase in right ventricular wall thickness. Right ventricular systolic function is normal. Tricuspid regurgitation signal is inadequate for assessing PA pressure. Left Atrium: Left atrial size was mildly dilated. Right Atrium: Right atrial size was mildly dilated. Pericardium: There is no evidence of pericardial effusion. Mitral Valve: The mitral valve is normal in structure. No evidence of mitral valve stenosis. Tricuspid Valve: The tricuspid valve is normal in structure. Tricuspid valve regurgitation is not demonstrated. No evidence of tricuspid stenosis. Aortic Valve: The aortic valve is normal in structure. Aortic valve regurgitation is not visualized. Mild aortic valve sclerosis is present, with no evidence of aortic valve stenosis. Aortic valve mean gradient measures 2.0 mmHg. Aortic valve peak gradient measures 4.2 mmHg. Aortic valve area, by VTI measures 2.52 cm. Pulmonic Valve: The pulmonic valve was normal in structure. Pulmonic valve regurgitation is not visualized. No evidence of pulmonic stenosis. Aorta: Aortic dilatation noted. There is mild dilatation of the aortic root, measuring 39 mm. Venous: The inferior vena cava was not well visualized.  IAS/Shunts: No atrial level shunt detected by color flow Doppler. LEFT VENTRICLE PLAX 2D LVIDd:         3.51 cm      Diastology LVIDs:         2.59 cm      LV e' medial:    4.24 cm/s LV PW:         1.43 cm      LV E/e' medial:  20.0 LV IVS:        2.17 cm      LV e' lateral:   5.22 cm/s LVOT diam:     2.00 cm      LV E/e' lateral: 16.3 LV SV:         55 LV SV Index:   27 LVOT Area:     3.14 cm  LV Volumes (MOD)  LV vol d, MOD A2C: 88.5 ml LV vol d, MOD A4C: 127.0 ml LV vol s, MOD A2C: 41.9 ml LV vol s, MOD A4C: 61.2 ml LV SV MOD A2C:     46.6 ml LV SV MOD A4C:     127.0 ml LV SV MOD BP:      55.6 ml RIGHT VENTRICLE RV Basal diam:  4.59 cm RV S prime:     12.90 cm/s TAPSE (M-mode): 3.5 cm LEFT ATRIUM             Index       RIGHT ATRIUM           Index LA diam:        3.60 cm 1.78 cm/m  RA Area:     21.50 cm LA Vol (A2C):   71.7 ml 35.46 ml/m RA Volume:   59.50 ml  29.43 ml/m LA Vol (A4C):   86.2 ml 42.63 ml/m LA Biplane Vol: 82.3 ml 40.71 ml/m  AORTIC VALVE                   PULMONIC VALVE AV Area (Vmax):    2.10 cm    PV Vmax:        0.86 m/s AV Area (Vmean):   1.91 cm    PV Peak grad:   3.0 mmHg AV Area (VTI):     2.52 cm    RVOT Peak grad: 6 mmHg AV Vmax:           103.00 cm/s AV Vmean:          69.100 cm/s AV VTI:            0.217 m AV Peak Grad:      4.2 mmHg AV Mean Grad:      2.0 mmHg LVOT Vmax:         68.70 cm/s LVOT Vmean:        42.000 cm/s LVOT VTI:          0.174 m LVOT/AV VTI ratio: 0.80  AORTA Ao Root diam: 3.90 cm MITRAL VALVE               TRICUSPID VALVE MV Area (PHT): 2.93 cm    TR Peak grad:   13.7 mmHg MV Decel Time: 259 msec    TR Vmax:        185.00 cm/s MV E velocity: 84.90 cm/s MV A velocity: 62.60 cm/s  SHUNTS MV E/A ratio:  1.36        Systemic VTI:  0.17 m                            Systemic Diam: 2.00 cm Kathlyn Sacramento MD Electronically signed by Kathlyn Sacramento MD Signature Date/Time: 06/12/2020/3:09:15 PM    Final       Subjective: Feels well, no chest pain, leg  swelling, dyspnea. Very eager to return home to his debilitated mother with whom he lives.   Discharge Exam: Vitals:   06/12/20 1430 06/12/20 1600  BP: (!) 145/86 130/82  Pulse: (!) 51 (!) 50  Resp: 15 18  Temp:    SpO2: 98% 98%   General: Pt is alert, awake, not in acute distress Cardiovascular: RRR, S1/S2 +, no rubs, no gallops Respiratory: CTA bilaterally, no wheezing, no rhonchi Abdominal: Soft, NT, ND, bowel sounds + Extremities: No edema, no cyanosis  Labs: BNP (last 3 results) Recent Labs    07/29/19  0845 08/03/19 1335 06/12/20 0131  BNP 2,085.0* 2,085.0* 938.1*   Basic Metabolic Panel: Recent Labs  Lab 06/12/20 0131  NA 141  K 4.3  CL 106  CO2 25  GLUCOSE 104*  BUN 14  CREATININE 1.46*  CALCIUM 9.0   Liver Function Tests: Recent Labs  Lab 06/12/20 0131  AST 29  ALT 24  ALKPHOS 71  BILITOT 0.8  PROT 7.3  ALBUMIN 4.0   No results for input(s): LIPASE, AMYLASE in the last 168 hours. No results for input(s): AMMONIA in the last 168 hours. CBC: Recent Labs  Lab 06/12/20 0131  WBC 7.5  HGB 15.3  HCT 46.2  MCV 98.9  PLT 204   Cardiac Enzymes: No results for input(s): CKTOTAL, CKMB, CKMBINDEX, TROPONINI in the last 168 hours. BNP: Invalid input(s): POCBNP CBG: No results for input(s): GLUCAP in the last 168 hours. D-Dimer No results for input(s): DDIMER in the last 72 hours. Hgb A1c No results for input(s): HGBA1C in the last 72 hours. Lipid Profile No results for input(s): CHOL, HDL, LDLCALC, TRIG, CHOLHDL, LDLDIRECT in the last 72 hours. Thyroid function studies Recent Labs    06/12/20 0908  TSH 0.468   Anemia work up No results for input(s): VITAMINB12, FOLATE, FERRITIN, TIBC, IRON, RETICCTPCT in the last 72 hours. Urinalysis    Component Value Date/Time   COLORURINE STRAW (A) 04/27/2020 1442   APPEARANCEUR CLEAR (A) 04/27/2020 1442   APPEARANCEUR Clear 12/15/2019 1226   LABSPEC 1.006 04/27/2020 1442   PHURINE 5.0 04/27/2020  1442   GLUCOSEU NEGATIVE 04/27/2020 1442   HGBUR NEGATIVE 04/27/2020 1442   BILIRUBINUR NEGATIVE 04/27/2020 1442   BILIRUBINUR Negative 12/15/2019 1226   KETONESUR NEGATIVE 04/27/2020 1442   PROTEINUR NEGATIVE 04/27/2020 1442   NITRITE NEGATIVE 04/27/2020 1442   LEUKOCYTESUR NEGATIVE 04/27/2020 1442    Microbiology Recent Results (from the past 240 hour(s))  Respiratory Panel by RT PCR (Flu A&B, Covid) - Nasopharyngeal Swab     Status: None   Collection Time: 06/12/20  4:09 AM   Specimen: Nasopharyngeal Swab  Result Value Ref Range Status   SARS Coronavirus 2 by RT PCR NEGATIVE NEGATIVE Final    Comment: (NOTE) SARS-CoV-2 target nucleic acids are NOT DETECTED.  The SARS-CoV-2 RNA is generally detectable in upper respiratoy specimens during the acute phase of infection. The lowest concentration of SARS-CoV-2 viral copies this assay can detect is 131 copies/mL. A negative result does not preclude SARS-Cov-2 infection and should not be used as the sole basis for treatment or other patient management decisions. A negative result may occur with  improper specimen collection/handling, submission of specimen other than nasopharyngeal swab, presence of viral mutation(s) within the areas targeted by this assay, and inadequate number of viral copies (<131 copies/mL). A negative result must be combined with clinical observations, patient history, and epidemiological information. The expected result is Negative.  Fact Sheet for Patients:  PinkCheek.be  Fact Sheet for Healthcare Providers:  GravelBags.it  This test is no t yet approved or cleared by the [place] FDA and  has been authorized for detection and/or diagnosis of SARS-CoV-2 by FDA under an Emergency Use Authorization (EUA). This EUA will remain  in effect (meaning this test can be used) for the duration of the COVID-19 declaration under Section 564(b)(1) of the  Act, 21 U.S.C. section 360bbb-3(b)(1), unless the authorization is terminated or revoked sooner.     Influenza A by PCR NEGATIVE NEGATIVE Final   Influenza B by PCR NEGATIVE NEGATIVE  Final    Comment: (NOTE) The Xpert Xpress SARS-CoV-2/FLU/RSV assay is intended as an aid in  the diagnosis of influenza from Nasopharyngeal swab specimens and  should not be used as a sole basis for treatment. Nasal washings and  aspirates are unacceptable for Xpert Xpress SARS-CoV-2/FLU/RSV  testing.  Fact Sheet for Patients: PinkCheek.be  Fact Sheet for Healthcare Providers: GravelBags.it  This test is not yet approved or cleared by the [place] FDA and  has been authorized for detection and/or diagnosis of SARS-CoV-2 by  FDA under an Emergency Use Authorization (EUA). This EUA will remain  in effect (meaning this test can be used) for the duration of the  Covid-19 declaration under Section 564(b)(1) of the Act, 21  U.S.C. section 360bbb-3(b)(1), unless the authorization is  terminated or revoked. Performed at [facility], [address], [place], [place] [address]     Time coordinating discharge: Approximately 40 minutes  Patrecia Pour, MD  [facility] 06/12/2020, 4:26 PM

## 2020-06-12 NOTE — ED Provider Notes (Signed)
[facility] Emergency Department Provider Note  ____________________________________________   First MD Initiated Contact with Patient 06/12/20 0032     (approximate)  I have reviewed the triage vital signs and the nursing notes.   HISTORY  Chief Complaint Loss of Consciousness    HPI Kevin [name] is a 64 y.o. male with below list of previous medical conditions including congestive heart failure COPD diabetes mellitus and hypertension presents to the emergency department via EMS following witnessed syncopal episode at home.  Patient states that he had a coughing spell and and everything "went black".  Per EMS on their arrival the patient markedly diaphoretic which is consistent with his presentation to the emergency department.  Patient denies any preceding chest pain shortness of breath irregular heartbeat headache weakness or numbness.  Patient denies any other symptoms at present.  Patient's glucose 162 per EMS.       Past Medical History:  Diagnosis Date  . Chronic combined systolic (congestive) and diastolic (congestive) heart failure ([place])    a. 2019 reported neg stress test and nl cors on cath in [facility]; b. 07/2019 Echo: EF 25-30%, Gr2 DD, glob HK. Mod reduced RV fxn w/ volume overload. Mod dil RA. Mild to mod TR. Mod elev PASP.  Marland Kitchen COPD (chronic obstructive pulmonary disease) ([place])   . Diabetes mellitus without complication ([place])   . Hypertension   . Noncompliance   . Pleural effusion on right    a. 07/2019 Thoracentesis: 300 ml.    Patient Active Problem List   Diagnosis Date Noted  . Prediabetes 03/30/2020  . Elevated lipids 12/23/2019  . Essential hypertension 12/07/2019  . Encounter to establish care 12/07/2019  . History of COPD 12/07/2019  . Type 2 diabetes, controlled, with neuropathy ([place])   . Pleural effusion on right   . Hypotension   . Nonsustained ventricular tachycardia ([place])   . Anasarca   . Acute on chronic congestive  heart failure ([place])   . Elevated LFTs   . Lower extremity edema   . COPD with acute exacerbation ([place])   . Neuropathy   . Hypertensive urgency 08/03/2019    No past surgical history on file.  Prior to Admission medications   Medication Sig Start Date End Date Taking? Authorizing Provider  albuterol (VENTOLIN HFA) 108 (90 Base) MCG/ACT inhaler Inhale 2 puffs into the lungs every 6 (six) hours as needed for wheezing or shortness of breath. 09/08/19  Yes Hackney, Otila Kluver A, FNP  amLODipine (NORVASC) 5 MG tablet Take 1 tablet (5 mg total) by mouth daily. 04/27/20 07/26/20 Yes Hackney, Otila Kluver A, FNP  carvedilol (COREG) 25 MG tablet Take 1 tablet (25 mg total) by mouth 2 (two) times daily. 04/27/20  Yes Hackney, Tina A, FNP  fluticasone furoate-vilanterol (BREO ELLIPTA) 200-25 MCG/INH AEPB Inhale 1 puff into the lungs daily. 09/08/19  Yes Darylene Price A, FNP  furosemide (LASIX) 40 MG tablet Take 1 tablet (40 mg total) by mouth daily. 04/27/20  Yes Hackney, Otila Kluver A, FNP  gabapentin (NEURONTIN) 300 MG capsule Take 300 mg by mouth 2 (two) times daily.   Yes [provider]  losartan (COZAAR) 100 MG tablet Take 1 tablet (100 mg total) by mouth daily. Until entresto is received 06/09/20 09/07/20 Yes Hackney, Otila Kluver A, FNP  pravastatin (PRAVACHOL) 20 MG tablet Take 1 tablet (20 mg total) by mouth daily. 05/04/20  Yes Iloabachie, Chioma E, NP  sacubitril-valsartan (ENTRESTO) 97-103 MG Take 1 tablet by mouth 2 (two) times daily.  04/27/20  Yes Darylene Price A, FNP  spironolactone (ALDACTONE) 25 MG tablet Take 1 tablet (25 mg total) by mouth daily. 04/27/20  Yes Darylene Price A, FNP  tiotropium (SPIRIVA) 18 MCG inhalation capsule Place 1 capsule (18 mcg total) into inhaler and inhale daily. 12/23/19  Yes Iloabachie, Chioma E, NP  Blood Pressure KIT 1 kit by Does not apply route daily. 12/23/19   Iloabachie, Chioma E, NP    Allergies Patient has no known allergies.  No family history on file.  Social History Social  History   Tobacco Use  . Smoking status: Current Some Day Smoker    Packs/day: 0.25    Years: 20.00    Pack years: 5.00    Types: Cigarettes  . Smokeless tobacco: Never Used  . Tobacco comment: a pack lasts a week (appx. 4 days)  Vaping Use  . Vaping Use: Never used  Substance Use Topics  . Alcohol use: Never  . Drug use: Not Currently    Review of Systems Constitutional: No fever/chills Eyes: No visual changes. ENT: No sore throat. Cardiovascular: Denies chest pain. Respiratory: Denies shortness of breath. Gastrointestinal: No abdominal pain.  No nausea, no vomiting.  No diarrhea.  No constipation. Genitourinary: Negative for dysuria. Musculoskeletal: Negative for neck pain.  Negative for back pain. Integumentary: Negative for rash. Neurological: Negative for headaches, focal weakness or numbness.  Positive for syncopal episode   ____________________________________________   PHYSICAL EXAM:  VITAL SIGNS: ED Triage Vitals [06/12/20 0024]  Enc Vitals Group     BP 103/69     Pulse      Resp 18     Temp 98.6 F (37 C)     Temp Source Oral     SpO2 96 %     Weight      Height      Head Circumference      Peak Flow      Pain Score      Pain Loc      Pain Edu?      Excl. in [place]?     Constitutional: Alert and oriented.  Eyes: Conjunctivae are normal.  Head: Atraumatic. Mouth/Throat: Patient is wearing a mask. Neck: No stridor.  No meningeal signs.   Cardiovascular: Normal rate, regular rhythm. Good peripheral circulation. Grossly normal heart sounds. Respiratory: Normal respiratory effort.  No retractions. Gastrointestinal: Soft and nontender. No distention.  Musculoskeletal: No lower extremity tenderness nor edema. No gross deformities of extremities. Neurologic:  Normal speech and language. No gross focal neurologic deficits are appreciated.  Skin:  Skin is warm, dry and intact. Psychiatric: Mood and affect are normal. Speech and behavior are  normal.  ____________________________________________   LABS (all labs ordered are listed, but only abnormal results are displayed)  Labs Reviewed  COMPREHENSIVE METABOLIC PANEL - Abnormal; Notable for the following components:      Result Value   Glucose, Bld 104 (*)    Creatinine, Ser 1.46 (*)    GFR, Estimated 50 (*)    All other components within normal limits  TROPONIN I (HIGH SENSITIVITY) - Abnormal; Notable for the following components:   Troponin I (High Sensitivity) 31 (*)    All other components within normal limits  CBC  TROPONIN I (HIGH SENSITIVITY)   ____________________________________________  EKG  ED ECG REPORT I, [place] N Esbeydi Manago, the attending physician, personally viewed and interpreted this ECG.   Date: 06/12/2020  EKG Time: 12:27 AM  Rate: 54  Rhythm: Sinus bradycardia with left  bundle branch block  Axis: Normal  Intervals: Normal  ST&T Change: None  ____________________________________________  RADIOLOGY I,  N Rhenda [place], personally viewed and evaluated these images (plain radiographs) as part of my medical decision making, as well as reviewing the written report by the radiologist.  ED MD interpretation: No acute intracranial pathology noted on CT head per radiologist.  Chest x-ray negative per radiologist.    Official radiology report(s): CT Head Wo Contrast  Result Date: 06/12/2020 CLINICAL DATA:  64 year old male with syncope. EXAM: CT HEAD WITHOUT CONTRAST TECHNIQUE: Contiguous axial images were obtained from the base of the skull through the vertex without intravenous contrast. COMPARISON:  None. FINDINGS: Brain: The ventricles and sulci appropriate size for patient's age. Mild periventricular and deep white matter chronic microvascular ischemic changes noted. There is no acute intracranial hemorrhage. No mass effect or midline shift no extra-axial fluid collection. Vascular: No hyperdense vessel or unexpected calcification. Skull:  Normal. Negative for fracture or focal lesion. Sinuses/Orbits: No acute finding. Other: None IMPRESSION: 1. No acute intracranial pathology. 2. Mild chronic microvascular ischemic changes. Electronically Signed   By: Anner Crete M.D.   On: 06/12/2020 01:20   DG Chest Portable 1 View  Result Date: 06/12/2020 CLINICAL DATA:  64 year old male with syncope. EXAM: PORTABLE CHEST 1 VIEW COMPARISON:  Chest radiograph dated 08/09/2019. FINDINGS: No focal consolidation, pleural effusion, pneumothorax. Stable mild cardiomegaly. Atherosclerotic calcification of the aorta. No acute osseous pathology. IMPRESSION: No active disease. Electronically Signed   By: Anner Crete M.D.   On: 06/12/2020 01:13     Procedures   ____________________________________________   INITIAL IMPRESSION / MDM / ASSESSMENT AND PLAN / ED COURSE  As part of my medical decision making, I reviewed the following data within the electronic MEDICAL RECORD NUMBER   64 year old male presented with above-stated history and physical exam following syncopal episode.  No clear etiology for the patient's syncopal episode identified as CT head is negative EKG revealed sinus bradycardia with left bundle branch block consistent with previous EKG.  Troponin elevated at 31 however patient has had a persistently elevated troponin based on chart biopsy.  Patient given aspirin 3 and 24 mg.  Patient discussed with hospitalist after hospital admission for further evaluation and management.  ____________________________________________  FINAL CLINICAL IMPRESSION(S) / ED DIAGNOSES  Final diagnoses:  Syncope, unspecified syncope type     MEDICATIONS GIVEN DURING THIS VISIT:  Medications - No data to display   ED Discharge Orders    None      *Please note:  Demosthenes Virnig was evaluated in Emergency Department on 06/12/2020 for the symptoms described in the history of present illness. He was evaluated in the context of the global COVID-19  pandemic, which necessitated consideration that the patient might be at risk for infection with the SARS-CoV-2 virus that causes COVID-19. Institutional protocols and algorithms that pertain to the evaluation of patients at risk for COVID-19 are in a state of rapid change based on information released by regulatory bodies including the CDC and federal and state organizations. These policies and algorithms were followed during the patient's care in the ED.  Some ED evaluations and interventions may be delayed as a result of limited staffing during and after the pandemic.*  Note:  This document was prepared using Dragon voice recognition software and may include unintentional dictation errors.   Gregor Hams, MD 06/12/20 [phone]

## 2020-06-13 ENCOUNTER — Telehealth (HOSPITAL_COMMUNITY): Payer: Self-pay

## 2020-06-13 NOTE — Telephone Encounter (Signed)
Have attempted several times today to contact Kevin [name] to make a home visit.  No answer and left messages on both phone numbers.  Will continue to try to reach him.   [name] [place] EMT-Paramedic [phone]

## 2020-06-14 ENCOUNTER — Other Ambulatory Visit: Payer: Self-pay | Admitting: Gerontology

## 2020-06-14 ENCOUNTER — Encounter (HOSPITAL_COMMUNITY): Payer: Self-pay

## 2020-06-14 ENCOUNTER — Other Ambulatory Visit (HOSPITAL_COMMUNITY): Payer: Self-pay

## 2020-06-14 NOTE — Progress Notes (Signed)
Today had a home visit with Kevin [name].  He states doing better today than the other day when he went to ED.  He states just got too hot bathing his mother.  Advised him he has to take care of his self to take care of her.  He has all his medications, he pulled out a bottle of Entresto from May17, 2021 that had been mailed to him with 90 day supply, notified Inetta Fermo with [name].  Will advise Medication management that he will not need the Losartan that was called in.  He needs to pick up gabapentin and provachal that is ready.  He states will go today.  Will revisit on Monday to finish his med boxes and recheck his medications that he picks up.  He has everything for daily living.  He lives with his Mom that is disabled.  He has family in town that checks on them.  He denies any chest pain, headaches, dizziness or shortness of breath.  He walks around inside home without assistance and when he goes outside he uses walker.  He has no swelling in legs and lungs are clear.  Will continue to visit for heart failure.   [name] [place] EMT-Paramedic [phone]

## 2020-06-19 ENCOUNTER — Encounter (HOSPITAL_COMMUNITY): Payer: Self-pay

## 2020-06-19 ENCOUNTER — Other Ambulatory Visit (HOSPITAL_COMMUNITY): Payer: Self-pay

## 2020-06-19 NOTE — Progress Notes (Signed)
Today had a home visit with Chong to check his medications.  He states he picked them up and he refilled his box.  Double checked it and he was missing some gabapentin in his slots and had an extra lasix in one slot.  Advised him to be very careful.  Advised him he can just let me fill it up.  He advised he has been filling good and has been staying cooler when bathing his mother.  He has everything he needs for daily living.  He lives with his Mom.  He is aware of appts coming up.  He denies any chest pain, swelling, headaches or dizziness.  He walking around without walker.  Will visit to check med boxes and for heart failure.   [name] [place] EMT-Paramedic [phone]

## 2020-07-01 ENCOUNTER — Emergency Department
Admission: EM | Admit: 2020-07-01 | Discharge: 2020-07-01 | Disposition: A | Discharge status: Home / Self Care | Payer: Medicaid Other | Attending: Emergency Medicine | Admitting: Emergency Medicine

## 2020-07-01 ENCOUNTER — Other Ambulatory Visit: Payer: Self-pay | Admitting: Emergency Medicine

## 2020-07-01 ENCOUNTER — Other Ambulatory Visit: Payer: Self-pay

## 2020-07-01 ENCOUNTER — Encounter: Payer: Self-pay | Admitting: Emergency Medicine

## 2020-07-01 DIAGNOSIS — I110 Hypertensive heart disease with heart failure: Secondary | ICD-10-CM | POA: Insufficient documentation

## 2020-07-01 DIAGNOSIS — I5042 Chronic combined systolic (congestive) and diastolic (congestive) heart failure: Secondary | ICD-10-CM | POA: Insufficient documentation

## 2020-07-01 DIAGNOSIS — J449 Chronic obstructive pulmonary disease, unspecified: Secondary | ICD-10-CM | POA: Insufficient documentation

## 2020-07-01 DIAGNOSIS — E119 Type 2 diabetes mellitus without complications: Secondary | ICD-10-CM | POA: Insufficient documentation

## 2020-07-01 DIAGNOSIS — M79651 Pain in right thigh: Secondary | ICD-10-CM

## 2020-07-01 DIAGNOSIS — F17210 Nicotine dependence, cigarettes, uncomplicated: Secondary | ICD-10-CM | POA: Insufficient documentation

## 2020-07-01 LAB — COMPREHENSIVE METABOLIC PANEL
ALT: 29 U/L (ref 0–44)
AST: 29 U/L (ref 15–41)
Albumin: 4.2 g/dL (ref 3.5–5.0)
Alkaline Phosphatase: 65 U/L (ref 38–126)
Anion gap: 12 (ref 5–15)
BUN: 16 mg/dL (ref 8–23)
CO2: 27 mmol/L (ref 22–32)
Calcium: 9.1 mg/dL (ref 8.9–10.3)
Chloride: 103 mmol/L (ref 98–111)
Creatinine, Ser: 0.98 mg/dL (ref 0.61–1.24)
GFR, Estimated: >60 mL/min (ref >60)
Glucose, Bld: 134 mg/dL — ABNORMAL HIGH (ref 70–99)
Potassium: 3.8 mmol/L (ref 3.5–5.1)
Sodium: 142 mmol/L (ref 135–145)
Total Bilirubin: 1.2 mg/dL (ref 0.3–1.2)
Total Protein: 8.1 g/dL (ref 6.5–8.1)

## 2020-07-01 LAB — CBC WITH DIFFERENTIAL/PLATELET
Abs Immature Granulocytes: 0.02 10*3/uL (ref 0.00–0.07)
Basophils Absolute: 0 10*3/uL (ref 0.0–0.1)
Basophils Relative: 0 %
Eosinophils Absolute: 0.1 10*3/uL (ref 0.0–0.5)
Eosinophils Relative: 1 %
HCT: 42 % (ref 39.0–52.0)
Hemoglobin: 14.3 g/dL (ref 13.0–17.0)
Immature Granulocytes: 0 %
Lymphocytes Relative: 28 %
Lymphs Abs: 2.4 10*3/uL (ref 0.7–4.0)
MCH: 32.6 pg (ref 26.0–34.0)
MCHC: 34 g/dL (ref 30.0–36.0)
MCV: 95.9 fL (ref 80.0–100.0)
Monocytes Absolute: 0.5 10*3/uL (ref 0.1–1.0)
Monocytes Relative: 6 %
Neutro Abs: 5.5 10*3/uL (ref 1.7–7.7)
Neutrophils Relative %: 65 %
Platelets: 287 10*3/uL (ref 150–400)
RBC: 4.38 MIL/uL (ref 4.22–5.81)
RDW: 12.8 % (ref 11.5–15.5)
WBC: 8.6 10*3/uL (ref 4.0–10.5)
nRBC: 0 % (ref 0.0–0.2)

## 2020-07-01 LAB — PROTIME-INR
INR: 1 (ref 0.8–1.2)
Prothrombin Time: 13.1 seconds (ref 11.4–15.2)

## 2020-07-01 MED ORDER — CYCLOBENZAPRINE HCL 10 MG PO TABS
10.0000 mg | ORAL_TABLET | Freq: Once | ORAL | Status: AC
  Administered 2020-07-01: 10 mg via ORAL
  Filled 2020-07-01: qty 1

## 2020-07-01 MED ORDER — CYCLOBENZAPRINE HCL 10 MG PO TABS: 10.0000 mg | ORAL_TABLET | Freq: Three times a day (TID) | ORAL | 0 refills | Status: DC | PRN

## 2020-07-01 NOTE — Discharge Instructions (Addendum)
Please discontinue taking your pravastatin until follow-up with your primary care provider.

## 2020-07-01 NOTE — ED Notes (Signed)
Patient family updated at this time.

## 2020-07-01 NOTE — ED Notes (Signed)
Pt assisted to toilet via WC. Pt taken back to bed and resting at this time.

## 2020-07-01 NOTE — ED Triage Notes (Signed)
Pt in via EMS from home with with c/o right knee and thigh pain for a week. Pt has taken ibuprofen with no improvement. No injuries, trauma, or arthritis. 220/134. Pt with hx of HTN but has not taken meds today. HR 54, 98% RA

## 2020-07-01 NOTE — ED Provider Notes (Addendum)
[facility] Emergency Department Provider Note   ____________________________________________   First MD Initiated Contact with Patient 07/01/20 1652     (approximate)  I have reviewed the triage vital signs and the nursing notes.   HISTORY  Chief Complaint Leg Pain    HPI Kevin [name] is a 64 y.o. male the past medical history of CHF, COPD, and type 2 diabetes who presents for acute onset of right thigh pain.  Patient states that approximately 2 weeks ago he started having intermittent sharp right thigh and hamstring pain that were instantaneous and resolved spontaneously.  Patient states that 2 nights prior to arrival he began having continuous, 10/10, nonradiating right thigh pain that is worse in the anterior as well as worsened with walking and partially relieved when sitting down and keeping the leg straight.  Patient states that he has tried Tylenol and ibuprofen for this pain with no relief of his symptoms.  Patient states that he has never had pain similar to this in the past.  Patient denies any associated knee, calf, or foot pain on the same side.  Patient denies any similar symptoms on the left side.         Past Medical History:  Diagnosis Date  . Chronic combined systolic (congestive) and diastolic (congestive) heart failure ([place])    a. 2019 reported neg stress test and nl cors on cath in [facility]; b. 07/2019 Echo: EF 25-30%, Gr2 DD, glob HK. Mod reduced RV fxn w/ volume overload. Mod dil RA. Mild to mod TR. Mod elev PASP.  Marland Kitchen COPD (chronic obstructive pulmonary disease) ([place])   . Diabetes mellitus without complication ([place])   . Hypertension   . Noncompliance   . Pleural effusion on right    a. 07/2019 Thoracentesis: 300 ml.    Patient Active Problem List   Diagnosis Date Noted  . COPD (chronic obstructive pulmonary disease) ([place]) 06/12/2020  . Chronic diastolic CHF (congestive heart failure) ([place]) 06/12/2020  . Elevated troponin  06/12/2020  . AKI (acute kidney injury) ([place]) 06/12/2020  . Postural dizziness with presyncope 06/12/2020  . Prediabetes 03/30/2020  . Elevated lipids 12/23/2019  . HTN (hypertension) 12/07/2019  . Encounter to establish care 12/07/2019  . History of COPD 12/07/2019  . Type 2 diabetes, controlled, with neuropathy ([place])   . Pleural effusion on right   . Hypotension   . Nonsustained ventricular tachycardia ([place])   . Anasarca   . Acute on chronic congestive heart failure ([place])   . Elevated LFTs   . Lower extremity edema   . COPD with acute exacerbation ([place])   . Neuropathy   . Hypertensive urgency 08/03/2019    History reviewed. No pertinent surgical history.  Prior to Admission medications   Medication Sig Start Date End Date Taking? Authorizing Provider  albuterol (VENTOLIN HFA) 108 (90 Base) MCG/ACT inhaler Inhale 2 puffs into the lungs every 6 (six) hours as needed for wheezing or shortness of breath. 09/08/19   Alisa Graff, FNP  amLODipine (NORVASC) 5 MG tablet Take 1 tablet (5 mg total) by mouth daily. 04/27/20 07/26/20  Darylene Price A, FNP  Blood Pressure KIT 1 kit by Does not apply route daily. 12/23/19   Iloabachie, Chioma E, NP  carvedilol (COREG) 25 MG tablet Take 1 tablet (25 mg total) by mouth 2 (two) times daily. 04/27/20   Alisa Graff, FNP  cyclobenzaprine (FLEXERIL) 10 MG tablet Take 1 tablet (10 mg total) by mouth 3 (three) times  daily as needed for up to 4 days for muscle spasms (left chest pain). 07/01/20 07/05/20  Naaman Plummer, MD  fluticasone furoate-vilanterol (BREO ELLIPTA) 200-25 MCG/INH AEPB Inhale 1 puff into the lungs daily. 09/08/19   Alisa Graff, FNP  furosemide (LASIX) 40 MG tablet Take 0.5 tablets (20 mg total) by mouth daily. 06/12/20   Patrecia Pour, MD  gabapentin (NEURONTIN) 300 MG capsule Take 300 mg by mouth 2 (two) times daily.    [provider]  pravastatin (PRAVACHOL) 20 MG tablet Take 1 tablet (20 mg total) by mouth daily. 05/04/20    Iloabachie, Chioma E, NP  sacubitril-valsartan (ENTRESTO) 97-103 MG Take 1 tablet by mouth 2 (two) times daily. 04/27/20   Alisa Graff, FNP  spironolactone (ALDACTONE) 25 MG tablet Take 1 tablet (25 mg total) by mouth daily. 04/27/20   Alisa Graff, FNP  tiotropium (SPIRIVA) 18 MCG inhalation capsule Place 1 capsule (18 mcg total) into inhaler and inhale daily. 12/23/19   Iloabachie, Chioma E, NP    Allergies Patient has no known allergies.  History reviewed. No pertinent family history.  Social History Social History   Tobacco Use  . Smoking status: Current Some Day Smoker    Packs/day: 0.25    Years: 20.00    Pack years: 5.00    Types: Cigarettes  . Smokeless tobacco: Never Used  . Tobacco comment: a pack lasts a week (appx. 4 days)  Vaping Use  . Vaping Use: Never used  Substance Use Topics  . Alcohol use: Never  . Drug use: Not Currently    Review of Systems Constitutional: No fever/chills Eyes: No visual changes. ENT: No sore throat. Cardiovascular: Denies chest pain. Respiratory: Denies shortness of breath. Gastrointestinal: No abdominal pain.  No nausea, no vomiting.  No diarrhea. Genitourinary: Negative for dysuria. Musculoskeletal: Positive for acute arthralgia in right lower extremity SKin: Negative for rash. Neurological: Negative for headaches, weakness/numbness/paresthesias in any extremity Psychiatric: Negative for suicidal ideation/homicidal ideation   ____________________________________________   PHYSICAL EXAM:  VITAL SIGNS: ED Triage Vitals  Enc Vitals Group     BP 07/01/20 1536 (!) 217/100     Pulse Rate 07/01/20 1536 (!) 54     Resp 07/01/20 1536 20     Temp 07/01/20 1536 98.6 F (37 C)     Temp Source 07/01/20 1536 Oral     SpO2 07/01/20 1536 95 %     Weight 07/01/20 1534 187 lb (84.8 kg)     Height 07/01/20 1534 '5\' 8"'  (1.727 m)     Head Circumference --      Peak Flow --      Pain Score 07/01/20 1534 10     Pain Loc --       Pain Edu? --      Excl. in [place]? --    Constitutional: Alert and oriented. Well appearing and in no acute distress. Eyes: Conjunctivae are normal. PERRL. Head: Atraumatic. Nose: No congestion/rhinnorhea. Mouth/Throat: Mucous membranes are moist. Neck: No stridor Cardiovascular: Grossly normal heart sounds.  Good peripheral circulation. Respiratory: Normal respiratory effort.  No retractions. Gastrointestinal: Soft and nontender. No distention. Musculoskeletal: No obvious deformities.  Tenderness palpation of the right thigh Neurologic:  Normal speech and language. No gross focal neurologic deficits are appreciated. Skin:  Skin is warm and dry. No rash noted. Psychiatric: Mood and affect are normal. Speech and behavior are normal.  ____________________________________________   LABS (all labs ordered are listed, but only abnormal results  are displayed)  Labs Reviewed  COMPREHENSIVE METABOLIC PANEL - Abnormal; Notable for the following components:      Result Value   Glucose, Bld 134 (*)    All other components within normal limits  CBC WITH DIFFERENTIAL/PLATELET  PROTIME-INR   ED ECG REPORT I, Naaman Plummer, the attending physician, personally viewed and interpreted this ECG.  Date: 07/04/2020 EKG Time: 1535 Rate: 53 Rhythm: Bradycardic sinus rhythm QRS Axis: normal Intervals: normal ST/T Wave abnormalities: normal Narrative Interpretation: no evidence of acute ischemia   PROCEDURES  Procedure(s) performed (including Critical Care):  Procedures   ____________________________________________   INITIAL IMPRESSION / ASSESSMENT AND PLAN / ED COURSE  As part of my medical decision making, I reviewed the following data within the [place] notes reviewed and incorporated, Labs reviewed, EKG interpreted, Old chart reviewed, Radiograph reviewed and Notes from prior ED visits reviewed and incorporated        Patient is a 64 year old male who  presents for right thigh pain that is been worsening over the last 2 weeks. Given history, exam and workup I have low suspicion for fracture, dislocation, significant ligamentous injury, septic arthritis, gout flare, new autoimmune arthropathy, or gonococcal arthropathy. Possible statin intolerance Interventions: Muscle relaxer with significant improvement of patient's pain Disposition: Discharge home with strict return precautions and instructions for prompt primary care follow up in the next week.      ____________________________________________   FINAL CLINICAL IMPRESSION(S) / ED DIAGNOSES  Final diagnoses:  Acute pain of right thigh     ED Discharge Orders         Ordered    cyclobenzaprine (FLEXERIL) 10 MG tablet  3 times daily PRN        07/01/20 1810           Note:  This document was prepared using Dragon voice recognition software and may include unintentional dictation errors.   Naaman Plummer, MD 07/01/20 1818    Naaman Plummer, MD 08/03/20 [phone]

## 2020-07-01 NOTE — ED Notes (Signed)
Pt verbalized understanding of d/c instructions at this time. Pt denies further questions 

## 2020-07-01 NOTE — ED Notes (Signed)
This RN to bedside at this time. Pt states 10/10 R leg pain that started 2 weeks ago. Pt states the pain was intermittent up until last night and has been constant since then.  Pt admits hx of heart failure and hypertension and states he takes his meds as prescribed.   Pt denies further needs at this time

## 2020-07-01 NOTE — ED Notes (Signed)
This RN spoke with Dr. Vicente Males regarding patient care, VORB for blood work and EKG.

## 2020-07-01 NOTE — ED Triage Notes (Signed)
Pt presents to ED via [facility] with c/o R thigh pain x several days. Pt states pain has been steadily increasing x several days.   Pt denies worsening pain with position changes/standing.  When not interacting with staff pt noted to be resting in wheelchair with eyes closed, respirations even and unlabored.   Per EMS pt has not had his HTN medication today.

## 2020-07-04 ENCOUNTER — Telehealth: Payer: Self-pay | Admitting: Gerontology

## 2020-07-25 ENCOUNTER — Other Ambulatory Visit: Payer: Self-pay | Admitting: Gerontology

## 2020-07-25 ENCOUNTER — Ambulatory Visit: Payer: Medicaid Other | Admitting: Gerontology

## 2020-07-25 ENCOUNTER — Other Ambulatory Visit: Payer: Self-pay

## 2020-07-25 VITALS — BP 175/101 | HR 62 | Temp 97.8°F | Wt 194.8 lb

## 2020-07-25 DIAGNOSIS — M79651 Pain in right thigh: Secondary | ICD-10-CM

## 2020-07-25 DIAGNOSIS — I10 Essential (primary) hypertension: Secondary | ICD-10-CM

## 2020-07-25 MED ORDER — AMLODIPINE BESYLATE 10 MG PO TABS: 10.0000 mg | ORAL_TABLET | Freq: Every day | ORAL | 1 refills | Status: DC

## 2020-07-25 MED ORDER — METHOCARBAMOL 500 MG PO TABS: 500.0000 mg | ORAL_TABLET | Freq: Two times a day (BID) | ORAL | 0 refills | Status: DC

## 2020-07-25 NOTE — Progress Notes (Signed)
Established Patient Office Visit  Subjective:  Patient ID: Kevin [name], male    DOB: November 11, 1955  Age: 64 y.o. MRN: [id number]  CC: No chief complaint on file.   HPI Garcia Dalzell presents for follow up after ED visit. He was seen at the ED on 07/01/2020 and was treated for Acute pain of right thigh with Cyclobenzaprine 10 mg tid. During his ED visit, no imaging was done and he reports that pain started 3 weeks ago. Currently, he states that he continues to experience intermittent sharp 10/10 pain to lateral and medial aspect of his right thigh muscle that radiates to the right leg. He reports experiencing 5-6 episodes of the pain daily. He states that pain resolves after 20 minutes of him stopping any activity and resting. He states that he takes [facility] powder, Cyclobenzaprine and Ibuprofen with minimal relief. He denies any causative factor, but states that walking and bearing weight on his right leg worsens symptoms. He denies muscle weakness,  and peripheral neuropathy. His blood pressure was elevated during visit, he states that he is compliant with his medication, does not check his blood pressure at home. He denies chest pain, palpitation, light headedness and vision changes. Overall, he states that he's doing well and offers no further complaint.   Past Medical History:  Diagnosis Date  . Chronic combined systolic (congestive) and diastolic (congestive) heart failure ([place])    a. 2019 reported neg stress test and nl cors on cath in [facility]; b. 07/2019 Echo: EF 25-30%, Gr2 DD, glob HK. Mod reduced RV fxn w/ volume overload. Mod dil RA. Mild to mod TR. Mod elev PASP.  Marland Kitchen COPD (chronic obstructive pulmonary disease) ([place])   . Diabetes mellitus without complication ([place])   . Hypertension   . Noncompliance   . Pleural effusion on right    a. 07/2019 Thoracentesis: 300 ml.    No past surgical history on file.  No family history on file.  Social History   Socioeconomic History  . Marital  status: Single    Spouse name: Not on file  . Number of children: 2  . Years of education: Not on file  . Highest education level: High school graduate  Occupational History  . Occupation: unemployed  Tobacco Use  . Smoking status: Current Some Day Smoker    Packs/day: 0.25    Years: 20.00    Pack years: 5.00    Types: Cigarettes  . Smokeless tobacco: Never Used  . Tobacco comment: a pack lasts a week (appx. 4 days)  Vaping Use  . Vaping Use: Never used  Substance and Sexual Activity  . Alcohol use: Never  . Drug use: Not Currently  . Sexual activity: Not Currently  Other Topics Concern  . Not on file  Social History Narrative   Patient moved to [place] from [place] in March of this year, to take care of his Mother. She lives in a community managed by her church, "independent living for seniors." Patient lives with his Mother, does not drive, and reports he mostly keeps to himself.   Social Determinants of Health   Financial Resource Strain: Medium Risk  . Difficulty of Paying Living Expenses: Somewhat hard  Food Insecurity: Food Insecurity Present  . Worried About Charity fundraiser in the Last Year: Sometimes true  . Ran Out of Food in the Last Year: Sometimes true  Transportation Needs: Unmet Transportation Needs  . Lack of Transportation (Medical): Yes  . Lack of Transportation (  Non-Medical): Yes  Physical Activity: Insufficiently Active  . Days of Exercise per Week: 2 days  . Minutes of Exercise per Session: 30 min  Stress: No Stress Concern Present  . Feeling of Stress : Not at all  Social Connections: Moderately Isolated  . Frequency of Communication with Friends and Family: Once a week  . Frequency of Social Gatherings with Friends and Family: Once a week  . Attends Religious Services: 1 to 4 times per year  . Active Member of Clubs or Organizations: Yes  . Attends Archivist Meetings: Never  . Marital Status: Never married  Intimate  Partner Violence: Not At Risk  . Fear of Current or Ex-Partner: No  . Emotionally Abused: No  . Physically Abused: No  . Sexually Abused: No    Outpatient Medications Prior to Visit  Medication Sig Dispense Refill  . albuterol (VENTOLIN HFA) 108 (90 Base) MCG/ACT inhaler Inhale 2 puffs into the lungs every 6 (six) hours as needed for wheezing or shortness of breath. 18 g 3  . carvedilol (COREG) 25 MG tablet Take 1 tablet (25 mg total) by mouth 2 (two) times daily. 180 tablet 3  . fluticasone furoate-vilanterol (BREO ELLIPTA) 200-25 MCG/INH AEPB Inhale 1 puff into the lungs daily. 60 each 3  . furosemide (LASIX) 40 MG tablet Take 0.5 tablets (20 mg total) by mouth daily.    Marland Kitchen gabapentin (NEURONTIN) 300 MG capsule Take 300 mg by mouth 2 (two) times daily.    . pravastatin (PRAVACHOL) 20 MG tablet Take 1 tablet (20 mg total) by mouth daily. 30 tablet 2  . sacubitril-valsartan (ENTRESTO) 97-103 MG Take 1 tablet by mouth 2 (two) times daily. 180 tablet 3  . spironolactone (ALDACTONE) 25 MG tablet Take 1 tablet (25 mg total) by mouth daily. 90 tablet 3  . tiotropium (SPIRIVA) 18 MCG inhalation capsule Place 1 capsule (18 mcg total) into inhaler and inhale daily. 30 capsule 3  . amLODipine (NORVASC) 5 MG tablet Take 1 tablet (5 mg total) by mouth daily. 90 tablet 3  . Blood Pressure KIT 1 kit by Does not apply route daily. 1 kit 0   No facility-administered medications prior to visit.    No Known Allergies  ROS Review of Systems  Constitutional: Negative.   Respiratory: Negative.   Cardiovascular: Negative.   Musculoskeletal: Myalgias: right thigh muscle pain.  Neurological: Negative.   Psychiatric/Behavioral: Negative.       Objective:    Physical Exam HENT:     Head: Normocephalic and atraumatic.  Cardiovascular:     Rate and Rhythm: Normal rate and regular rhythm.     Pulses: Normal pulses.     Heart sounds: Normal heart sounds.  Pulmonary:     Effort: Pulmonary effort is  normal.     Breath sounds: Normal breath sounds.  Musculoskeletal:        General: Tenderness (palpating lateral aspect of right  thigh) present.  Skin:    General: Skin is warm and dry.  Neurological:     General: No focal deficit present.     Mental Status: He is alert and oriented to person, place, and time. Mental status is at baseline.  Psychiatric:        Mood and Affect: Mood normal.        Behavior: Behavior normal.        Thought Content: Thought content normal.        Judgment: Judgment normal.     BP Marland Kitchen)  175/101 (BP Location: Left Arm, Patient Position: Sitting, Cuff Size: Large)   Pulse 62   Temp 97.8 F (36.6 C)   Wt 194 lb 12.8 oz (88.4 kg)   BMI 29.62 kg/m  Wt Readings from Last 3 Encounters:  07/25/20 194 lb 12.8 oz (88.4 kg)  07/01/20 187 lb (84.8 kg)  06/19/20 187 lb (84.8 kg)     Health Maintenance Due  Topic Date Due  . Hepatitis C Screening  Never done  . PNEUMOCOCCAL POLYSACCHARIDE VACCINE AGE 78-64 HIGH RISK  Never done  . OPHTHALMOLOGY EXAM  Never done  . URINE MICROALBUMIN  Never done  . TETANUS/TDAP  Never done  . INFLUENZA VACCINE  Never done    There are no preventive care reminders to display for this patient.  Lab Results  Component Value Date   TSH 0.468 06/12/2020   Lab Results  Component Value Date   WBC 8.6 07/01/2020   HGB 14.3 07/01/2020   HCT 42.0 07/01/2020   MCV 95.9 07/01/2020   PLT 287 07/01/2020   Lab Results  Component Value Date   NA 142 07/01/2020   K 3.8 07/01/2020   CO2 27 07/01/2020   GLUCOSE 134 (H) 07/01/2020   BUN 16 07/01/2020   CREATININE 0.98 07/01/2020   BILITOT 1.2 07/01/2020   ALKPHOS 65 07/01/2020   AST 29 07/01/2020   ALT 29 07/01/2020   PROT 8.1 07/01/2020   ALBUMIN 4.2 07/01/2020   CALCIUM 9.1 07/01/2020   ANIONGAP 12 07/01/2020   Lab Results  Component Value Date   CHOL 167 04/27/2020   Lab Results  Component Value Date   HDL 44 04/27/2020   Lab Results  Component Value Date    LDLCALC 107 (H) 04/27/2020   Lab Results  Component Value Date   TRIG 81 04/27/2020   Lab Results  Component Value Date   CHOLHDL 3.8 04/27/2020   Lab Results  Component Value Date   HGBA1C 5.8 (H) 04/27/2020      Assessment & Plan:    1. Pain in right thigh -Unable to determine  causative factor, he will continue Robaxin 500 mg bid, was educated  On medication side effects and advised to notify clinic.                                       - methocarbamol (ROBAXIN) 500 MG tablet; Take 1 tablet (500 mg total) by mouth 2 (two) times daily.  Dispense: 14 tablet; Refill: 0  2. Primary hypertension -His blood pressure is not controlled,his goal should be less than 130/80. His Amlodipine was increased to 10 mg daily. He will check his blood pressure daily, record,bring log to F/U appointment, smoking cessation was advised and DASH diet. - amLODipine (NORVASC) 10 MG tablet; Take 1 tablet (10 mg total) by mouth daily.  Dispense: 90 tablet; Refill: 1    Follow-up: Return in about 22 days (around 08/16/2020), or if symptoms worsen or fail to improve.    Caitriona Sundquist Jerold Coombe, NP

## 2020-07-25 NOTE — Patient Instructions (Signed)
DASH Eating Plan DASH stands for "Dietary Approaches to Stop Hypertension." The DASH eating plan is a healthy eating plan that has been shown to reduce high blood pressure (hypertension). It may also reduce your risk for type 2 diabetes, heart disease, and stroke. The DASH eating plan may also help with weight loss. What are tips for following this plan?  General guidelines  Avoid eating more than 2,300 mg (milligrams) of salt (sodium) a day. If you have hypertension, you may need to reduce your sodium intake to 1,500 mg a day.  Limit alcohol intake to no more than 1 drink a day for nonpregnant women and 2 drinks a day for men. One drink equals 12 oz of beer, 5 oz of wine, or 1 oz of hard liquor.  Work with your health care provider to maintain a healthy body weight or to lose weight. Ask what an ideal weight is for you.  Get at least 30 minutes of exercise that causes your heart to beat faster (aerobic exercise) most days of the week. Activities may include walking, swimming, or biking.  Work with your health care provider or diet and nutrition specialist (dietitian) to adjust your eating plan to your individual calorie needs. Reading food labels   Check food labels for the amount of sodium per serving. Choose foods with less than 5 percent of the Daily Value of sodium. Generally, foods with less than 300 mg of sodium per serving fit into this eating plan.  To find whole grains, look for the word "whole" as the first word in the ingredient list. Shopping  Buy products labeled as "low-sodium" or "no salt added."  Buy fresh foods. Avoid canned foods and premade or frozen meals. Cooking  Avoid adding salt when cooking. Use salt-free seasonings or herbs instead of table salt or sea salt. Check with your health care provider or pharmacist before using salt substitutes.  Do not fry foods. Cook foods using healthy methods such as baking, boiling, grilling, and broiling instead.  Cook with  heart-healthy oils, such as olive, canola, soybean, or sunflower oil. Meal planning  Eat a balanced diet that includes: ? 5 or more servings of fruits and vegetables each day. At each meal, try to fill half of your plate with fruits and vegetables. ? Up to 6-8 servings of whole grains each day. ? Less than 6 oz of lean meat, poultry, or fish each day. A 3-oz serving of meat is about the same size as a deck of cards. One egg equals 1 oz. ? 2 servings of low-fat dairy each day. ? A serving of nuts, seeds, or beans 5 times each week. ? Heart-healthy fats. Healthy fats called Omega-3 fatty acids are found in foods such as flaxseeds and coldwater fish, like sardines, salmon, and mackerel.  Limit how much you eat of the following: ? Canned or prepackaged foods. ? Food that is high in trans fat, such as fried foods. ? Food that is high in saturated fat, such as fatty meat. ? Sweets, desserts, sugary drinks, and other foods with added sugar. ? Full-fat dairy products.  Do not salt foods before eating.  Try to eat at least 2 vegetarian meals each week.  Eat more home-cooked food and less restaurant, buffet, and fast food.  When eating at a restaurant, ask that your food be prepared with less salt or no salt, if possible. What foods are recommended? The items listed may not be a complete list. Talk with your dietitian about   what dietary choices are best for you. Grains Whole-grain or whole-wheat bread. Whole-grain or whole-wheat pasta. Brown rice. Oatmeal. Quinoa. Bulgur. Whole-grain and low-sodium cereals. Pita bread. Low-fat, low-sodium crackers. Whole-wheat flour tortillas. Vegetables Fresh or frozen vegetables (raw, steamed, roasted, or grilled). Low-sodium or reduced-sodium tomato and vegetable juice. Low-sodium or reduced-sodium tomato sauce and tomato paste. Low-sodium or reduced-sodium canned vegetables. Fruits All fresh, dried, or frozen fruit. Canned fruit in natural juice (without  added sugar). Meat and other protein foods Skinless chicken or turkey. Ground chicken or turkey. Pork with fat trimmed off. Fish and seafood. Egg whites. Dried beans, peas, or lentils. Unsalted nuts, nut butters, and seeds. Unsalted canned beans. Lean cuts of beef with fat trimmed off. Low-sodium, lean deli meat. Dairy Low-fat (1%) or fat-free (skim) milk. Fat-free, low-fat, or reduced-fat cheeses. Nonfat, low-sodium ricotta or cottage cheese. Low-fat or nonfat yogurt. Low-fat, low-sodium cheese. Fats and oils Soft margarine without trans fats. Vegetable oil. Low-fat, reduced-fat, or light mayonnaise and salad dressings (reduced-sodium). Canola, safflower, olive, soybean, and sunflower oils. Avocado. Seasoning and other foods Herbs. Spices. Seasoning mixes without salt. Unsalted popcorn and pretzels. Fat-free sweets. What foods are not recommended? The items listed may not be a complete list. Talk with your dietitian about what dietary choices are best for you. Grains Baked goods made with fat, such as croissants, muffins, or some breads. Dry pasta or rice meal packs. Vegetables Creamed or fried vegetables. Vegetables in a cheese sauce. Regular canned vegetables (not low-sodium or reduced-sodium). Regular canned tomato sauce and paste (not low-sodium or reduced-sodium). Regular tomato and vegetable juice (not low-sodium or reduced-sodium). Pickles. Olives. Fruits Canned fruit in a light or heavy syrup. Fried fruit. Fruit in cream or butter sauce. Meat and other protein foods Fatty cuts of meat. Ribs. Fried meat. Bacon. Sausage. Bologna and other processed lunch meats. Salami. Fatback. Hotdogs. Bratwurst. Salted nuts and seeds. Canned beans with added salt. Canned or smoked fish. Whole eggs or egg yolks. Chicken or turkey with skin. Dairy Whole or 2% milk, cream, and half-and-half. Whole or full-fat cream cheese. Whole-fat or sweetened yogurt. Full-fat cheese. Nondairy creamers. Whipped toppings.  Processed cheese and cheese spreads. Fats and oils Butter. Stick margarine. Lard. Shortening. Ghee. Bacon fat. Tropical oils, such as coconut, palm kernel, or palm oil. Seasoning and other foods Salted popcorn and pretzels. Onion salt, garlic salt, seasoned salt, table salt, and sea salt. Worcestershire sauce. Tartar sauce. Barbecue sauce. Teriyaki sauce. Soy sauce, including reduced-sodium. Steak sauce. Canned and packaged gravies. Fish sauce. Oyster sauce. Cocktail sauce. Horseradish that you find on the shelf. Ketchup. Mustard. Meat flavorings and tenderizers. Bouillon cubes. Hot sauce and Tabasco sauce. Premade or packaged marinades. Premade or packaged taco seasonings. Relishes. Regular salad dressings. Where to find more information:  National Heart, Lung, and Blood Institute: www.nhlbi.nih.gov  American Heart Association: www.heart.org Summary  The DASH eating plan is a healthy eating plan that has been shown to reduce high blood pressure (hypertension). It may also reduce your risk for type 2 diabetes, heart disease, and stroke.  With the DASH eating plan, you should limit salt (sodium) intake to 2,300 mg a day. If you have hypertension, you may need to reduce your sodium intake to 1,500 mg a day.  When on the DASH eating plan, aim to eat more fresh fruits and vegetables, whole grains, lean proteins, low-fat dairy, and heart-healthy fats.  Work with your health care provider or diet and nutrition specialist (dietitian) to adjust your eating plan to your   individual calorie needs. This information is not intended to replace advice given to you by your health care provider. Make sure you discuss any questions you have with your health care provider. Document Revised: 07/25/2017 Document Reviewed: 08/05/2016 Elsevier Patient Education  2020 Else[employer] Inc. Muscle Pain, Adult Muscle pain (myalgia) may be mild or severe. In most cases, the pain lasts only a short time and it goes away without  treatment. It is normal to feel some muscle pain after starting a workout program. Muscles that have not been used often will be sore at first. Muscle pain may also be caused by many other things, including:  Overuse or muscle strain, especially if you are not in shape. This is the most common cause of muscle pain.  Injury.  Bruises.  Viruses, such as the flu.  Infectious diseases.  A chronic condition that causes muscle tenderness, fatigue, and headache (fibromyalgia).  A condition, such as lupus, in which the body's disease-fighting system attacks other organs in the body (autoimmune or rheumatologic diseases).  Certain drugs, including ACE inhibitors and statins. To diagnose the cause of your muscle pain, your health care provider will do a physical exam and ask questions about the pain and when it began. If you have not had muscle pain for very long, your health care provider may want to wait before doing much testing. If your muscle pain has lasted a long time, your health care provider may want to run tests right away. In some cases, this may include tests to rule out certain conditions or illnesses. Treatment for muscle pain depends on the cause. Home care is often enough to relieve muscle pain. Your health care provider may also prescribe anti-inflammatory medicine. Follow these instructions at home: Activity  If overuse is causing your muscle pain: ? Slow down your activities until the pain goes away. ? Do regular, gentle exercises if you are not usually active. ? Warm up before exercising. Stretch before and after exercising. This can help lower the risk of muscle pain.  Do not continue working out if the pain is very bad. Bad pain could mean that you have injured a muscle. Managing pain and discomfort   If directed, apply ice to the sore muscle: ? Put ice in a plastic bag. ? Place a towel between your skin and the bag. ? Leave the ice on for 20 minutes, 2-3 times a  day.  You may also alternate between applying ice and applying heat as told by your health care provider. To apply heat, use the heat source that your health care provider recommends, such as a moist heat pack or a heating pad. ? Place a towel between your skin and the heat source. ? Leave the heat on for 20-30 minutes. ? Remove the heat if your skin turns bright red. This is especially important if you are unable to feel pain, heat, or cold. You may have a greater risk of getting burned. Medicines  Take over-the-counter and prescription medicines only as told by your health care provider.  Do not drive or use heavy machinery while taking prescription pain medicine. Contact a health care provider if:  Your muscle pain gets worse and medicines do not help.  You have muscle pain that lasts longer than 3 days.  You have a rash or fever along with muscle pain.  You have muscle pain after a tick bite.  You have muscle pain while working out, even though you are in good physical condition.  You have redness, soreness, or swelling along with muscle pain.  You have muscle pain after starting a new medicine or changing the dose of a medicine. Get help right away if:  You have trouble breathing.  You have trouble swallowing.  You have muscle pain along with a stiff neck, fever, and vomiting.  You have severe muscle weakness or cannot move part of your body. This information is not intended to replace advice given to you by your health care provider. Make sure you discuss any questions you have with your health care provider. Document Revised: 07/25/2017 Document Reviewed: 01/02/2016 Elsevier Patient Education  2020 [employer].

## 2020-07-27 ENCOUNTER — Telehealth: Payer: Self-pay | Admitting: Gerontology

## 2020-08-02 ENCOUNTER — Encounter: Payer: Self-pay | Admitting: Family

## 2020-08-02 ENCOUNTER — Other Ambulatory Visit: Payer: Self-pay

## 2020-08-02 ENCOUNTER — Ambulatory Visit: Payer: Medicaid Other | Attending: Family | Admitting: Family

## 2020-08-02 VITALS — BP 136/81 | HR 67 | Resp 18 | Ht 68.0 in | Wt 182.5 lb

## 2020-08-02 DIAGNOSIS — E119 Type 2 diabetes mellitus without complications: Secondary | ICD-10-CM | POA: Insufficient documentation

## 2020-08-02 DIAGNOSIS — Z79899 Other long term (current) drug therapy: Secondary | ICD-10-CM | POA: Insufficient documentation

## 2020-08-02 DIAGNOSIS — Z7951 Long term (current) use of inhaled steroids: Secondary | ICD-10-CM | POA: Insufficient documentation

## 2020-08-02 DIAGNOSIS — M79651 Pain in right thigh: Secondary | ICD-10-CM | POA: Insufficient documentation

## 2020-08-02 DIAGNOSIS — I5042 Chronic combined systolic (congestive) and diastolic (congestive) heart failure: Secondary | ICD-10-CM | POA: Insufficient documentation

## 2020-08-02 DIAGNOSIS — J449 Chronic obstructive pulmonary disease, unspecified: Secondary | ICD-10-CM | POA: Insufficient documentation

## 2020-08-02 DIAGNOSIS — I110 Hypertensive heart disease with heart failure: Secondary | ICD-10-CM | POA: Insufficient documentation

## 2020-08-02 DIAGNOSIS — F17210 Nicotine dependence, cigarettes, uncomplicated: Secondary | ICD-10-CM | POA: Insufficient documentation

## 2020-08-02 DIAGNOSIS — I10 Essential (primary) hypertension: Secondary | ICD-10-CM

## 2020-08-02 NOTE — Progress Notes (Signed)
Patient ID: Kevin [name], male    DOB: 11/22/1955, 64 y.o.   MRN: [id number]  HPI  Kevin [name] is a 64 y/o male with a history of HTN, COPD, tobacco use and chronic heart failure.   Echo report from 01/13/20 reviewed and showed an EF of 50-55% along with moderate LVH. Echo report from 08/04/2019 reviewed and showed an EF of 25-30% along with mild/moderate TR.   Was in the ED 07/01/20 due to right thigh pain where he was evaluated and released. Was in the ED 06/12/20 due to syncopal event at home. Glucose was 162. Evaluated and released. Was in the ED 03/17/20 due to vomiting after eating a lot of fruit. IVF given due to kidney function and he was released.    He presents today for a follow-up visit with a chief complaint of intermittent right thigh pain. Says that this has been present for ~ 1 month and says that it's not any better since being evaluated in the ED. He doesn't have any other symptoms and specifically denies any difficulty sleeping, abdominal distention, palpitations, pedal edema, chest pain, shortness of breath, cough, dizziness, fatigue or weight gain.   Past Medical History:  Diagnosis Date  . Chronic combined systolic (congestive) and diastolic (congestive) heart failure ([place])    a. 2019 reported neg stress test and nl cors on cath in [facility]; b. 07/2019 Echo: EF 25-30%, Gr2 DD, glob HK. Mod reduced RV fxn w/ volume overload. Mod dil RA. Mild to mod TR. Mod elev PASP.  Marland Kitchen COPD (chronic obstructive pulmonary disease) ([place])   . Diabetes mellitus without complication ([place])   . Hypertension   . Noncompliance   . Pleural effusion on right    a. 07/2019 Thoracentesis: 300 ml.    Social History   Tobacco Use  . Smoking status: Current Some Day Smoker    Packs/day: 0.25    Years: 20.00    Pack years: 5.00    Types: Cigarettes  . Smokeless tobacco: Never Used  . Tobacco comment: a pack lasts a week (appx. 4 days)  Substance Use Topics  . Alcohol use: Never   No Known  Allergies  Prior to Admission medications   Medication Sig Start Date End Date Taking? Authorizing Provider  albuterol (VENTOLIN HFA) 108 (90 Base) MCG/ACT inhaler Inhale 2 puffs into the lungs every 6 (six) hours as needed for wheezing or shortness of breath. 09/08/19  Yes Darylene Price A, FNP  amLODipine (NORVASC) 10 MG tablet Take 1 tablet (10 mg total) by mouth daily. 07/25/20 10/23/20 Yes Iloabachie, Chioma E, NP  Blood Pressure KIT 1 kit by Does not apply route daily. 12/23/19  Yes Iloabachie, Chioma E, NP  carvedilol (COREG) 25 MG tablet Take 1 tablet (25 mg total) by mouth 2 (two) times daily. 04/27/20  Yes Erskine Steinfeldt A, FNP  fluticasone furoate-vilanterol (BREO ELLIPTA) 200-25 MCG/INH AEPB Inhale 1 puff into the lungs daily. 09/08/19  Yes Darylene Price A, FNP  furosemide (LASIX) 40 MG tablet Take 0.5 tablets (20 mg total) by mouth daily. Patient taking differently: Take 40 mg by mouth daily.  06/12/20  Yes Patrecia Pour, MD  gabapentin (NEURONTIN) 300 MG capsule Take 300 mg by mouth 2 (two) times daily.   Yes [provider]  methocarbamol (ROBAXIN) 500 MG tablet Take 1 tablet (500 mg total) by mouth 2 (two) times daily. 07/25/20  Yes Iloabachie, Chioma E, NP  pravastatin (PRAVACHOL) 20 MG tablet Take 1 tablet (20 mg  total) by mouth daily. 05/04/20  Yes Iloabachie, Chioma E, NP  sacubitril-valsartan (ENTRESTO) 97-103 MG Take 1 tablet by mouth 2 (two) times daily. 04/27/20  Yes Darylene Price A, FNP  spironolactone (ALDACTONE) 25 MG tablet Take 1 tablet (25 mg total) by mouth daily. 04/27/20  Yes Darylene Price A, FNP  tiotropium (SPIRIVA) 18 MCG inhalation capsule Place 1 capsule (18 mcg total) into inhaler and inhale daily. 12/23/19  Yes Iloabachie, Chioma E, NP    Review of Systems  Constitutional: Negative for appetite change and fatigue.  HENT: Negative for congestion, postnasal drip and sore throat.   Eyes: Negative.   Respiratory: Negative for cough, chest tightness and shortness  of breath.   Cardiovascular: Negative for chest pain, palpitations and leg swelling.  Gastrointestinal: Negative for abdominal distention and abdominal pain.  Endocrine: Negative.   Genitourinary: Negative.   Musculoskeletal: Positive for myalgias (right thigh). Negative for back pain and neck pain.  Skin: Negative.   Allergic/Immunologic: Negative.   Neurological: Negative for dizziness and light-headedness.  Hematological: Negative for adenopathy. Does not bruise/bleed easily.  Psychiatric/Behavioral: Negative for dysphoric mood and sleep disturbance (sleeping on 1 pillows). The patient is not nervous/anxious.    Vitals:   08/02/20 1204  BP: 136/81  Pulse: 67  Resp: 18  SpO2: 100%  Weight: 182 lb 8 oz (82.8 kg)  Height: '5\' 8"'  (1.727 m)   Wt Readings from Last 3 Encounters:  08/02/20 182 lb 8 oz (82.8 kg)  07/25/20 194 lb 12.8 oz (88.4 kg)  07/01/20 187 lb (84.8 kg)   Lab Results  Component Value Date   CREATININE 0.98 07/01/2020   CREATININE 1.46 (H) 06/12/2020   CREATININE 1.09 04/27/2020    Physical Exam Vitals and nursing note reviewed.  Constitutional:      Appearance: Normal appearance.  HENT:     Head: Normocephalic and atraumatic.  Neck:     Vascular: No carotid bruit.  Cardiovascular:     Rate and Rhythm: Normal rate and regular rhythm.  Pulmonary:     Effort: Pulmonary effort is normal. No respiratory distress.     Breath sounds: No wheezing or rales.  Abdominal:     General: Abdomen is flat. There is no distension.     Palpations: Abdomen is soft.  Musculoskeletal:        General: No tenderness.     Cervical back: Normal range of motion and neck supple.     Right lower leg: No tenderness. No edema.     Left lower leg: No tenderness. No edema.  Skin:    General: Skin is warm and dry.  Neurological:     General: No focal deficit present.     Mental Status: He is alert and oriented to person, place, and time.  Psychiatric:        Mood and Affect:  Mood normal.        Behavior: Behavior normal.    Assessment & Plan:  1: Chronic heart failure with now preserved ejection fraction along with structural changes- - NYHA class I - euvolemic today - weighing daily and reminded to call for an overnight weight gain of >2 pounds or a weekly weight gain of >5 pounds - weight down 13 pounds from last visit here 2 month ago; he says that he's decreased how much he's eating and that he has plenty of food to eat - not adding salt and has been trying to read food labels; reviewed the importance of closely following  a 2055m sodium diet  - saw cardiology ([facility]) 01/14/20 - participating in paramedicine program - BNP 08/03/2019 was 2085.0 - patient says that he's received both COVID vaccines  2: HTN- - BP looks good today - [facility] - BMP on 07/01/20 reviewed and showed sodium 142, potassium 3.8, creatinine 0.98 and GFR >60  3: COPD-  - using nebulizer and inhalers - smoking 4 cigarettes daily   Medication bottles reviewed.   Return in 3 months or sooner for any questions/problems before then.

## 2020-08-02 NOTE — Patient Instructions (Signed)
Continue weighing daily and call for an overnight weight gain of > 2 pounds or a weekly weight gain of >5 pounds. 

## 2020-08-09 ENCOUNTER — Ambulatory Visit: Payer: Medicaid Other | Admitting: Adult Health

## 2020-08-11 ENCOUNTER — Telehealth: Payer: Self-pay | Admitting: Pharmacist

## 2020-08-11 NOTE — Telephone Encounter (Signed)
08/11/2020 12:31:49 PM - Virgel Bouquet Ellipta refill online with [facility]  -- Rhetta Mura - Friday, August 11, 2020 12:31 PM --Placed refill online with [facility] for Earlie Server Rx# [id number].  Order# [id number].

## 2020-08-15 ENCOUNTER — Other Ambulatory Visit (HOSPITAL_COMMUNITY): Payer: Self-pay

## 2020-08-16 ENCOUNTER — Ambulatory Visit: Payer: Medicaid Other | Admitting: Adult Health

## 2020-08-16 ENCOUNTER — Encounter (HOSPITAL_COMMUNITY): Payer: Self-pay

## 2020-08-16 NOTE — Progress Notes (Signed)
Today had a home visit with Kevin [name].  He states been feeling good.  He wants to know if ok to take [facility] for his legs hurting.  Contacted Tina with [name] and advised him no, do not take it.  I explained why he should not be taking it.  He has prescriptions meds for his spasms and hurting.  Explained to him to talk with his doctor at his next appt.  He appeared to understand not to take any more of them.  He has all his medications and he has been putting them in the boxes. It appears he is placing them right.  He denies any chest pain, headaches, shortness of breath or dizziness.  He stays active taking care of his mother.  He has everything needed for daily living, eating low sodium is hard due to they get food from food pantries a lot.  Lungs are clear, he has a little edema in extremities, no more than his normal.  He states urinating good.  Will continue to visit for heart failure, diet and medication compliance.   [name] [place] EMT-Paramedic [phone]

## 2020-08-30 ENCOUNTER — Emergency Department: Payer: Self-pay

## 2020-08-30 ENCOUNTER — Emergency Department
Admission: EM | Admit: 2020-08-30 | Discharge: 2020-08-30 | Disposition: A | Discharge status: Home / Self Care | Payer: Self-pay | Attending: Emergency Medicine | Admitting: Emergency Medicine

## 2020-08-30 ENCOUNTER — Other Ambulatory Visit: Payer: Self-pay | Admitting: Emergency Medicine

## 2020-08-30 ENCOUNTER — Ambulatory Visit: Payer: Medicaid Other | Admitting: Gerontology

## 2020-08-30 ENCOUNTER — Other Ambulatory Visit: Payer: Self-pay

## 2020-08-30 VITALS — BP 149/90 | HR 75 | Wt 186.1 lb

## 2020-08-30 DIAGNOSIS — F17210 Nicotine dependence, cigarettes, uncomplicated: Secondary | ICD-10-CM | POA: Insufficient documentation

## 2020-08-30 DIAGNOSIS — R220 Localized swelling, mass and lump, head: Secondary | ICD-10-CM | POA: Insufficient documentation

## 2020-08-30 DIAGNOSIS — G8911 Acute pain due to trauma: Secondary | ICD-10-CM

## 2020-08-30 DIAGNOSIS — W19XXXA Unspecified fall, initial encounter: Secondary | ICD-10-CM

## 2020-08-30 DIAGNOSIS — Z7951 Long term (current) use of inhaled steroids: Secondary | ICD-10-CM | POA: Insufficient documentation

## 2020-08-30 DIAGNOSIS — S139XXA Sprain of joints and ligaments of unspecified parts of neck, initial encounter: Secondary | ICD-10-CM

## 2020-08-30 DIAGNOSIS — M5410 Radiculopathy, site unspecified: Secondary | ICD-10-CM | POA: Insufficient documentation

## 2020-08-30 DIAGNOSIS — I5042 Chronic combined systolic (congestive) and diastolic (congestive) heart failure: Secondary | ICD-10-CM | POA: Insufficient documentation

## 2020-08-30 DIAGNOSIS — E1140 Type 2 diabetes mellitus with diabetic neuropathy, unspecified: Secondary | ICD-10-CM | POA: Insufficient documentation

## 2020-08-30 DIAGNOSIS — Z79899 Other long term (current) drug therapy: Secondary | ICD-10-CM | POA: Insufficient documentation

## 2020-08-30 DIAGNOSIS — W228XXA Striking against or struck by other objects, initial encounter: Secondary | ICD-10-CM | POA: Insufficient documentation

## 2020-08-30 DIAGNOSIS — S134XXA Sprain of ligaments of cervical spine, initial encounter: Secondary | ICD-10-CM | POA: Insufficient documentation

## 2020-08-30 DIAGNOSIS — J441 Chronic obstructive pulmonary disease with (acute) exacerbation: Secondary | ICD-10-CM | POA: Insufficient documentation

## 2020-08-30 DIAGNOSIS — M79651 Pain in right thigh: Secondary | ICD-10-CM

## 2020-08-30 DIAGNOSIS — I110 Hypertensive heart disease with heart failure: Secondary | ICD-10-CM | POA: Insufficient documentation

## 2020-08-30 MED ORDER — PREDNISONE 20 MG PO TABS: 20.0000 mg | ORAL_TABLET | Freq: Every day | ORAL | 0 refills | Status: DC

## 2020-08-30 MED ORDER — LIDOCAINE 5 % EX PTCH: 1.0000 | MEDICATED_PATCH | Freq: Two times a day (BID) | CUTANEOUS | 0 refills | Status: DC

## 2020-08-30 MED ORDER — METHOCARBAMOL 500 MG PO TABS: 500.0000 mg | ORAL_TABLET | Freq: Two times a day (BID) | ORAL | 0 refills | Status: DC

## 2020-08-30 NOTE — Patient Instructions (Signed)
Shoulder Pain Many things can cause shoulder pain, including:  An injury.  Moving the shoulder in the same way again and again (overuse).  Joint pain (arthritis). Pain can come from:  Swelling and irritation (inflammation) of any part of the shoulder.  An injury to the shoulder joint.  An injury to: ? Tissues that connect muscle to bone (tendons). ? Tissues that connect bones to each other (ligaments). ? Bones. Follow these instructions at home: Watch for changes in your symptoms. Let your doctor know about them. Follow these instructions to help with your pain. If you have a sling:  Wear the sling as told by your doctor. Remove it only as told by your doctor.  Loosen the sling if your fingers: ? Tingle. ? Become numb. ? Turn cold and blue.  Keep the sling clean.  If the sling is not waterproof: ? Do not let it get wet. ? Take the sling off when you shower or bathe. Managing pain, stiffness, and swelling   If told, put ice on the painful area: ? Put ice in a plastic bag. ? Place a towel between your skin and the bag. ? Leave the ice on for 20 minutes, 2-3 times a day. Stop putting ice on if it does not help with the pain.  Squeeze a soft ball or a foam pad as much as possible. This prevents swelling in the shoulder. It also helps to strengthen the arm. General instructions  Take over-the-counter and prescription medicines only as told by your doctor.  Keep all follow-up visits as told by your doctor. This is important. Contact a doctor if:  Your pain gets worse.  Medicine does not help your pain.  You have new pain in your arm, hand, or fingers. Get help right away if:  Your arm, hand, or fingers: ? Tingle. ? Are numb. ? Are swollen. ? Are painful. ? Turn white or blue. Summary  Shoulder pain can be caused by many things. These include injury, moving the shoulder in the same away again and again, and joint pain.  Watch for changes in your symptoms.  Let your doctor know about them.  This condition may be treated with a sling, ice, and pain medicine.  Contact your doctor if the pain gets worse or you have new pain. Get help right away if your arm, hand, or fingers tingle or get numb, swollen, or painful.  Keep all follow-up visits as told by your doctor. This is important. This information is not intended to replace advice given to you by your health care provider. Make sure you discuss any questions you have with your health care provider. Document Revised: 02/24/2018 Document Reviewed: 02/24/2018 Elsevier Patient Education  2020 Elsevier Inc.  

## 2020-08-30 NOTE — ED Provider Notes (Signed)
[facility] Emergency Department Provider Note    ____________________________________________   I have reviewed the triage vital signs and the nursing notes.   HISTORY  Chief Complaint Fall   History limited by: Not Limited   HPI Kevin [name] is a 65 y.o. male who presents to the emergency department today because of concerns for neck and left shoulder and arm pain after a fall.  The patient states that the fall occurred 2 days ago.  He was in his seat when he fell forward.  Thinks he was almost falling asleep.  He landed on his head and his left shoulder.  He states since that he has had discomfort and pain to his neck and left shoulder.  He also has some pain rating down down his left arm.  He does feel like he is having a harder time using that left arm.  Records reviewed. Per medical record review patient has a history of COPD, DM, HTN.   Past Medical History:  Diagnosis Date  . Chronic combined systolic (congestive) and diastolic (congestive) heart failure ([place])    a. 2019 reported neg stress test and nl cors on cath in [facility]; b. 07/2019 Echo: EF 25-30%, Gr2 DD, glob HK. Mod reduced RV fxn w/ volume overload. Mod dil RA. Mild to mod TR. Mod elev PASP.  Marland Kitchen COPD (chronic obstructive pulmonary disease) ([place])   . Diabetes mellitus without complication ([place])   . Hypertension   . Noncompliance   . Pleural effusion on right    a. 07/2019 Thoracentesis: 300 ml.    Patient Active Problem List   Diagnosis Date Noted  . Pain in right thigh 07/25/2020  . COPD (chronic obstructive pulmonary disease) ([place]) 06/12/2020  . Chronic diastolic CHF (congestive heart failure) ([place]) 06/12/2020  . Elevated troponin 06/12/2020  . AKI (acute kidney injury) ([place]) 06/12/2020  . Postural dizziness with presyncope 06/12/2020  . Prediabetes 03/30/2020  . Elevated lipids 12/23/2019  . HTN (hypertension) 12/07/2019  . Encounter to establish care 12/07/2019  . History of  COPD 12/07/2019  . Type 2 diabetes, controlled, with neuropathy ([place])   . Pleural effusion on right   . Hypotension   . Nonsustained ventricular tachycardia ([place])   . Anasarca   . Acute on chronic congestive heart failure ([place])   . Elevated LFTs   . Lower extremity edema   . COPD with acute exacerbation ([place])   . Neuropathy   . Hypertensive urgency 08/03/2019    No past surgical history on file.  Prior to Admission medications   Medication Sig Start Date End Date Taking? Authorizing Provider  albuterol (VENTOLIN HFA) 108 (90 Base) MCG/ACT inhaler Inhale 2 puffs into the lungs every 6 (six) hours as needed for wheezing or shortness of breath. 09/08/19   Alisa Graff, FNP  amLODipine (NORVASC) 10 MG tablet Take 1 tablet (10 mg total) by mouth daily. 07/25/20 10/23/20  Iloabachie, Chioma E, NP  Blood Pressure KIT 1 kit by Does not apply route daily. 12/23/19   Iloabachie, Chioma E, NP  carvedilol (COREG) 25 MG tablet Take 1 tablet (25 mg total) by mouth 2 (two) times daily. 04/27/20   Darylene Price A, FNP  fluticasone furoate-vilanterol (BREO ELLIPTA) 200-25 MCG/INH AEPB Inhale 1 puff into the lungs daily. 09/08/19   Alisa Graff, FNP  furosemide (LASIX) 40 MG tablet Take 0.5 tablets (20 mg total) by mouth daily. Patient taking differently: Take 40 mg by mouth daily. 06/12/20   Bonner Puna,  Meredith Leeds, MD  gabapentin (NEURONTIN) 300 MG capsule Take 300 mg by mouth 2 (two) times daily.    [provider]  methocarbamol (ROBAXIN) 500 MG tablet Take 1 tablet (500 mg total) by mouth 2 (two) times daily. 08/30/20   Abate, Desta A, NP  pravastatin (PRAVACHOL) 20 MG tablet Take 1 tablet (20 mg total) by mouth daily. 05/04/20   Iloabachie, Chioma E, NP  sacubitril-valsartan (ENTRESTO) 97-103 MG Take 1 tablet by mouth 2 (two) times daily. 04/27/20   Alisa Graff, FNP  spironolactone (ALDACTONE) 25 MG tablet Take 1 tablet (25 mg total) by mouth daily. 04/27/20   Alisa Graff, FNP  tiotropium (SPIRIVA)  18 MCG inhalation capsule Place 1 capsule (18 mcg total) into inhaler and inhale daily. 12/23/19   Iloabachie, Chioma E, NP    Allergies Patient has no known allergies.  No family history on file.  Social History Social History   Tobacco Use  . Smoking status: Current Some Day Smoker    Packs/day: 0.25    Years: 20.00    Pack years: 5.00    Types: Cigarettes  . Smokeless tobacco: Never Used  . Tobacco comment: a pack lasts a week (appx. 4 days)  Vaping Use  . Vaping Use: Never used  Substance Use Topics  . Alcohol use: Never  . Drug use: Not Currently    Review of Systems Constitutional: No fever/chills Eyes: No visual changes. ENT: No sore throat. Cardiovascular: Denies chest pain. Respiratory: Denies shortness of breath. Gastrointestinal: No abdominal pain.  No nausea, no vomiting.  No diarrhea.   Genitourinary: Negative for dysuria. Musculoskeletal: Positive for neck pain, positive for left arm pain. Skin: Negative for rash. Neurological: Negative for headaches, focal weakness or numbness.  ____________________________________________   PHYSICAL EXAM:  VITAL SIGNS: ED Triage Vitals  Enc Vitals Group     BP 08/30/20 1725 121/87     Pulse Rate 08/30/20 1725 (!) 58     Resp 08/30/20 1725 18     Temp 08/30/20 1725 98.5 F (36.9 C)     Temp Source 08/30/20 1725 Oral     SpO2 08/30/20 1725 100 %     Weight 08/30/20 1726 186 lb 1.1 oz (84.4 kg)     Height 08/30/20 1726 '5\' 8"'  (1.727 m)     Head Circumference --      Peak Flow --      Pain Score 08/30/20 1726 8    Constitutional: Alert and oriented.  Eyes: Conjunctivae are normal.  ENT      Head: Normocephalic and atraumatic.      Nose: No congestion/rhinnorhea.      Mouth/Throat: Mucous membranes are moist.      Neck: No stridor. Hematological/Lymphatic/Immunilogical: No cervical lymphadenopathy. Cardiovascular: Normal rate, regular rhythm.  No murmurs, rubs, or gallops.  Respiratory: Normal respiratory  effort without tachypnea nor retractions. Breath sounds are clear and equal bilaterally. No wheezes/rales/rhonchi. Gastrointestinal: Soft and non tender. No rebound. No guarding.  Genitourinary: Deferred Musculoskeletal: Slightly limited range in left shoulder. NV intact distally. Neurologic:  Normal speech and language. No gross focal neurologic deficits are appreciated.  Skin:  Skin is warm, dry and intact. No rash noted. Psychiatric: Mood and affect are normal. Speech and behavior are normal. Patient exhibits appropriate insight and judgment.  ____________________________________________    LABS (pertinent positives/negatives)  None  ____________________________________________   EKG  None  ____________________________________________    RADIOLOGY  CT head/cervical spine No acute fracture or osseous  injury  Left shoulder Arthritis but no acute osseous injury or dislocation  ____________________________________________   PROCEDURES  Procedures  ____________________________________________   INITIAL IMPRESSION / ASSESSMENT AND PLAN / ED COURSE  Pertinent labs & imaging results that were available during my care of the patient were reviewed by me and considered in my medical decision making (see chart for details).   Patient presented to the emergency department today with concerns for pain after a fall.  Imaging without any acute osseous injuries or dislocation.  Given the patient's description of the pain I do think he likely suffered a simple sprain and suffering from some cervical radiculopathy.  Discussed this with the patient.  He is already on gabapentin.  Will give patient short course of low-dose steroids.  I did discuss with patient the possibility of hyperglycemia with steroids and courage patient to closely monitor his glucose levels. ____________________________________________   FINAL CLINICAL IMPRESSION(S) / ED DIAGNOSES  Final diagnoses:  Fall,  initial encounter  Neck sprain, initial encounter  Radiculopathy, unspecified spinal region     Note: This dictation was prepared with Dragon dictation. Any transcriptional errors that result from this process are unintentional     Nance Pear, MD 08/30/20 [phone]

## 2020-08-30 NOTE — ED Triage Notes (Signed)
Pt here with a fall that occurred Mon and hit his head. Pt states that his neck, head, and shoulder are in pain. Pt NAD in triage.

## 2020-08-30 NOTE — Progress Notes (Signed)
°  [facility]   Progress Note: General Provider: Regino Bellow, NP  SUBJECTIVE:   Kevin [name] is a 65 y.o. male who  has a past medical history of Chronic combined systolic (congestive) and diastolic (congestive) heart failure (HCC), COPD (chronic obstructive pulmonary disease) (HCC), Diabetes mellitus without complication (HCC), Hypertension, Noncompliance, and Pleural effusion on right. The patient presents today for left shoulder and arm pain after a fall on 08/28/2020. He states that he was sitting/sleeping in the chair when he fell and hit his right forehead and left shoulder. Since the fall, he noticed his left-hand swelling, unable to make a fist, pain when lifting his left arm overhead. In addition, he reports a constant burning, sharp pain 8/10 to the left shoulder down to the hand. His left upper extremity pain exacerbates by movement and he gets minimal relief fromTylenol and ice pack. Positive Apley scratch and Neer's test.  He states that he will go to the ED this afternoon. Overall, he states that he's doing well and offers no further complaint Review of Systems  Constitutional: Negative.   Eyes: Negative.   Respiratory: Negative.   Cardiovascular: Negative.   Gastrointestinal: Negative.   Genitourinary: Negative.   Musculoskeletal: Positive for falls.       Left shoulder pain  Skin: Negative.   Neurological: Positive for tingling (left hand) and weakness (left hand).  Psychiatric/Behavioral: Negative.      OBJECTIVE: BP (!) 149/90    Pulse 75    Wt 186 lb 1.6 oz (84.4 kg)    SpO2 98%    BMI 28.30 kg/m   Wt Readings from Last 3 Encounters:  08/30/20 186 lb 1.1 oz (84.4 kg)  08/30/20 186 lb 1.6 oz (84.4 kg)  08/16/20 182 lb (82.6 kg)     Physical Exam Constitutional:      Appearance: Normal appearance.  Cardiovascular:     Rate and Rhythm: Normal rate and regular rhythm.     Pulses: Normal pulses.  Abdominal:     General: Bowel sounds are normal.      Palpations: Abdomen is soft.  Musculoskeletal:        General: Swelling (left hand) and tenderness (LUE) present.       Arms:     Cervical back: Normal range of motion.  Skin:    General: Skin is warm and dry.  Neurological:     General: No focal deficit present.     Mental Status: He is alert and oriented to person, place, and time.  Psychiatric:        Mood and Affect: Mood normal.        Behavior: Behavior normal.     ASSESSMENT/PLAN:  1. Acute pain of left shoulder due to trauma  - Ambulatory referral to Orthopedic Surgery - methocarbamol (ROBAXIN) 500 MG tablet; Take 1 tablet (500 mg total) by mouth 2 (two) times daily.  Dispense: 14 tablet; Refill: 0    Return in about 2 weeks (around 09/13/2020), or if symptoms worsen or fail to improve.    The patient was given clear instructions to go to ER or return to medical center if symptoms do not improve, worsen or new problems develop. The patient verbalized understanding and agreed with plan of care.  Breland Trouten, AGNP [facility]

## 2020-08-30 NOTE — Discharge Instructions (Signed)
Please seek medical attention for any high fevers, chest pain, shortness of breath, change in behavior, persistent vomiting, bloody stool or any other new or concerning symptoms.  

## 2020-09-07 ENCOUNTER — Other Ambulatory Visit: Payer: Medicaid Other

## 2020-10-04 ENCOUNTER — Other Ambulatory Visit: Payer: Medicaid Other

## 2020-10-04 ENCOUNTER — Other Ambulatory Visit: Payer: Self-pay

## 2020-10-04 DIAGNOSIS — R7303 Prediabetes: Secondary | ICD-10-CM

## 2020-10-04 DIAGNOSIS — R899 Unspecified abnormal finding in specimens from other organs, systems and tissues: Secondary | ICD-10-CM

## 2020-10-04 DIAGNOSIS — E785 Hyperlipidemia, unspecified: Secondary | ICD-10-CM

## 2020-10-05 LAB — URINALYSIS
Bilirubin, UA: NEGATIVE
Glucose, UA: NEGATIVE
Ketones, UA: NEGATIVE
Leukocytes,UA: NEGATIVE
Nitrite, UA: NEGATIVE
Protein,UA: NEGATIVE
RBC, UA: NEGATIVE
Specific Gravity, UA: 1.02 (ref 1.005–1.030)
Urobilinogen, Ur: 2 mg/dL — ABNORMAL HIGH (ref 0.2–1.0)
pH, UA: 7.5 (ref 5.0–7.5)

## 2020-10-05 LAB — LIPID PANEL
Chol/HDL Ratio: 4.2 ratio (ref 0.0–5.0)
Cholesterol, Total: 193 mg/dL (ref 100–199)
HDL: 46 mg/dL (ref >39)
LDL Chol Calc (NIH): 131 mg/dL — ABNORMAL HIGH (ref 0–99)
Triglycerides: 88 mg/dL (ref 0–149)
VLDL Cholesterol Cal: 16 mg/dL (ref 5–40)

## 2020-10-05 LAB — COMPREHENSIVE METABOLIC PANEL
ALT: 20 IU/L (ref 0–44)
AST: 25 IU/L (ref 0–40)
Albumin/Globulin Ratio: 1.8 (ref 1.2–2.2)
Albumin: 4.5 g/dL (ref 3.8–4.8)
Alkaline Phosphatase: 85 IU/L (ref 44–121)
BUN/Creatinine Ratio: 12 (ref 10–24)
BUN: 12 mg/dL (ref 8–27)
Bilirubin Total: 0.5 mg/dL (ref 0.0–1.2)
CO2: 22 mmol/L (ref 20–29)
Calcium: 9.3 mg/dL (ref 8.6–10.2)
Chloride: 106 mmol/L (ref 96–106)
Creatinine, Ser: 0.98 mg/dL (ref 0.76–1.27)
GFR calc Af Amer: 94 mL/min/{1.73_m2} (ref >59)
GFR calc non Af Amer: 82 mL/min/{1.73_m2} (ref >59)
Globulin, Total: 2.5 g/dL (ref 1.5–4.5)
Glucose: 111 mg/dL — ABNORMAL HIGH (ref 65–99)
Potassium: 3.8 mmol/L (ref 3.5–5.2)
Sodium: 141 mmol/L (ref 134–144)
Total Protein: 7 g/dL (ref 6.0–8.5)

## 2020-10-05 LAB — HEMOGLOBIN A1C
Est. average glucose Bld gHb Est-mCnc: 126 mg/dL
Hgb A1c MFr Bld: 6 % — ABNORMAL HIGH (ref 4.8–5.6)

## 2020-10-12 ENCOUNTER — Ambulatory Visit: Payer: Medicaid Other | Admitting: Gerontology

## 2020-10-18 ENCOUNTER — Telehealth (HOSPITAL_COMMUNITY): Payer: Self-pay

## 2020-10-18 NOTE — Telephone Encounter (Signed)
Have attempted to contact several times, left messages.  Will continue to try.   [name] [place] EMT-Paramedic [phone]

## 2020-10-23 ENCOUNTER — Other Ambulatory Visit (HOSPITAL_COMMUNITY): Payer: Self-pay

## 2020-10-24 ENCOUNTER — Ambulatory Visit: Payer: Medicaid Other | Admitting: Gerontology

## 2020-10-24 ENCOUNTER — Encounter (HOSPITAL_COMMUNITY): Payer: Self-pay

## 2020-10-24 NOTE — Progress Notes (Signed)
Had a home visit with Neill today.  He states been doing well.  He has no complaints today such as pain, chest pain, dizziness or increased shortness of breath.  He is ambulatory without assistance short distances, walks with walker and has a scooter to go outside.  He has everything for daily living, gave information about [employer], he states they cut his food stamps down.  He is aware of up coming appts.  He is filling his med boxes and they are correst.  He is running out of some of his meds, he states going Tuesday to pick them up from medication management.  He is still looking for a 2 bedroom apartment for him and his Mom, they are making due with the place they have now.  Blood pressure is elevated today, he had not took his meds.  He states it has been lower, looked through history on his bp machine and it has been but had a couple of days of elevated in 170's.  He took his meds while I was there.  Advised him to keep an checking it and if stays high to call me.  He appeared to understand.  He asked about taking acetaminophen and I advised him that is safe for him to take.  Will continue to visit for heart failure, diet and medication management.   [name] [place] EMT-Paramedic [phone]

## 2020-10-27 ENCOUNTER — Other Ambulatory Visit: Payer: Self-pay | Admitting: Family

## 2020-10-27 ENCOUNTER — Other Ambulatory Visit: Payer: Self-pay | Admitting: Gerontology

## 2020-10-30 NOTE — Progress Notes (Deleted)
Patient ID: Kevin [name], male    DOB: Dec 20, 1955, 65 y.o.   MRN: [id number]  HPI  Kevin [name] is a 65 y/o male with a history of HTN, COPD, tobacco use and chronic heart failure.   Echo report from 01/13/20 reviewed and showed an EF of 50-55% along with moderate LVH. Echo report from 08/04/2019 reviewed and showed an EF of 25-30% along with mild/moderate TR.   Was in the ED 08/30/20 due to neck and left shoulder and arm pain after a fall 2 days prior. Short course of oral steroids given and he was released. Was in the ED 07/01/20 due to right thigh pain where he was evaluated and released. Was in the ED 06/12/20 due to syncopal event at home. Glucose was 162. Evaluated and released.   He presents today for a follow-up visit with a chief complaint of   Past Medical History:  Diagnosis Date  . Chronic combined systolic (congestive) and diastolic (congestive) heart failure (HCC)    a. 2019 reported neg stress test and nl cors on cath in [facility]; b. 07/2019 Echo: EF 25-30%, Gr2 DD, glob HK. Mod reduced RV fxn w/ volume overload. Mod dil RA. Mild to mod TR. Mod elev PASP.  Marland Kitchen COPD (chronic obstructive pulmonary disease) (HCC)   . Diabetes mellitus without complication (HCC)   . Hypertension   . Noncompliance   . Pleural effusion on right    a. 07/2019 Thoracentesis: 300 ml.    Social History   Tobacco Use  . Smoking status: Current Some Day Smoker    Packs/day: 0.25    Years: 20.00    Pack years: 5.00    Types: Cigarettes  . Smokeless tobacco: Never Used  . Tobacco comment: a pack lasts a week (appx. 4 days)  Substance Use Topics  . Alcohol use: Never   No Known Allergies    Review of Systems  Constitutional: Negative for appetite change and fatigue.  HENT: Negative for congestion, postnasal drip and sore throat.   Eyes: Negative.   Respiratory: Negative for cough, chest tightness and shortness of breath.   Cardiovascular: Negative for chest pain, palpitations and leg swelling.   Gastrointestinal: Negative for abdominal distention and abdominal pain.  Endocrine: Negative.   Genitourinary: Negative.   Musculoskeletal: Positive for myalgias (right thigh). Negative for back pain and neck pain.  Skin: Negative.   Allergic/Immunologic: Negative.   Neurological: Negative for dizziness and light-headedness.  Hematological: Negative for adenopathy. Does not bruise/bleed easily.  Psychiatric/Behavioral: Negative for dysphoric mood and sleep disturbance (sleeping on 1 pillows). The patient is not nervous/anxious.      Physical Exam Vitals and nursing note reviewed.  Constitutional:      Appearance: Normal appearance.  HENT:     Head: Normocephalic and atraumatic.  Neck:     Vascular: No carotid bruit.  Cardiovascular:     Rate and Rhythm: Normal rate and regular rhythm.  Pulmonary:     Effort: Pulmonary effort is normal. No respiratory distress.     Breath sounds: No wheezing or rales.  Abdominal:     General: Abdomen is flat. There is no distension.     Palpations: Abdomen is soft.  Musculoskeletal:        General: No tenderness.     Cervical back: Normal range of motion and neck supple.     Right lower leg: No tenderness. No edema.     Left lower leg: No tenderness. No edema.  Skin:  General: Skin is warm and dry.  Neurological:     General: No focal deficit present.     Mental Status: He is alert and oriented to person, place, and time.  Psychiatric:        Mood and Affect: Mood normal.        Behavior: Behavior normal.    Assessment & Plan:  1: Chronic heart failure with now preserved ejection fraction along with structural changes- - NYHA class I - euvolemic today - weighing daily and reminded to call for an overnight weight gain of >2 pounds or a weekly weight gain of >5 pounds - weight 182.8 pounds from last visit here 3 months ago - not adding salt and has been trying to read food labels; reviewed the importance of closely following a  2000mg  sodium diet  - saw cardiology (Agbor-Etang) 01/14/20 - participating in paramedicine program - BNP 08/03/2019 was 2085.0 - patient says that he's received both COVID vaccines  2: HTN- - BP  - saw PCP at [facility] 08/30/20 - BMP on 10/04/20 reviewed and showed sodium 141, potassium 3.8, creatinine 0.98 and GFR 94  3: COPD-  - using nebulizer and inhalers - smoking 4 cigarettes daily   Medication bottles reviewed.

## 2020-10-31 ENCOUNTER — Other Ambulatory Visit: Payer: Self-pay | Admitting: Gerontology

## 2020-10-31 ENCOUNTER — Ambulatory Visit: Payer: Medicaid Other | Admitting: Family

## 2020-11-13 ENCOUNTER — Other Ambulatory Visit: Payer: Self-pay

## 2020-11-13 ENCOUNTER — Ambulatory Visit: Payer: Medicaid Other | Attending: Family | Admitting: Family

## 2020-11-13 ENCOUNTER — Encounter: Payer: Self-pay | Admitting: Family

## 2020-11-13 VITALS — BP 115/80 | HR 52 | Wt 188.0 lb

## 2020-11-13 DIAGNOSIS — F17210 Nicotine dependence, cigarettes, uncomplicated: Secondary | ICD-10-CM | POA: Insufficient documentation

## 2020-11-13 DIAGNOSIS — R635 Abnormal weight gain: Secondary | ICD-10-CM | POA: Insufficient documentation

## 2020-11-13 DIAGNOSIS — Z79899 Other long term (current) drug therapy: Secondary | ICD-10-CM | POA: Insufficient documentation

## 2020-11-13 DIAGNOSIS — I5042 Chronic combined systolic (congestive) and diastolic (congestive) heart failure: Secondary | ICD-10-CM | POA: Insufficient documentation

## 2020-11-13 DIAGNOSIS — J449 Chronic obstructive pulmonary disease, unspecified: Secondary | ICD-10-CM | POA: Insufficient documentation

## 2020-11-13 DIAGNOSIS — I110 Hypertensive heart disease with heart failure: Secondary | ICD-10-CM | POA: Insufficient documentation

## 2020-11-13 DIAGNOSIS — Z7901 Long term (current) use of anticoagulants: Secondary | ICD-10-CM | POA: Insufficient documentation

## 2020-11-13 DIAGNOSIS — I10 Essential (primary) hypertension: Secondary | ICD-10-CM

## 2020-11-13 DIAGNOSIS — Z7951 Long term (current) use of inhaled steroids: Secondary | ICD-10-CM | POA: Insufficient documentation

## 2020-11-13 NOTE — Progress Notes (Signed)
Patient ID: Kevin [name], male    DOB: 1956-03-25, 65 y.o.   MRN: [id number]  HPI  Kevin [name] is a 65 y/o male with a history of HTN, COPD, tobacco use and chronic heart failure.   Echo report from 01/13/20 reviewed and showed an EF of 50-55% along with moderate LVH. Echo report from 08/04/2019 reviewed and showed an EF of 25-30% along with mild/moderate TR.   Was in the ED 08/30/20 due to neck and left shoulder and arm pain after a fall 2 days prior. Short course of oral steroids given and he was released. Was in the ED 07/01/20 due to right thigh pain where he was evaluated and released. Was in the ED 06/12/20 due to syncopal event at home. Glucose was 162. Evaluated and released.   He presents today for a follow-up visit with a chief complaint of gradual weight gain. He says that this has occurred slowly over several months. He has no other symptoms and specifically denies any difficulty sleeping, abdominal distention, palpitations, pedal edema, chest pain, shortness of breath, cough, dizziness or fatigue.   Past Medical History:  Diagnosis Date  . Chronic combined systolic (congestive) and diastolic (congestive) heart failure ([place])    a. 2019 reported neg stress test and nl cors on cath in [facility]; b. 07/2019 Echo: EF 25-30%, Gr2 DD, glob HK. Mod reduced RV fxn w/ volume overload. Mod dil RA. Mild to mod TR. Mod elev PASP.  Marland Kitchen COPD (chronic obstructive pulmonary disease) ([place])   . Diabetes mellitus without complication ([place])   . Hypertension   . Noncompliance   . Pleural effusion on right    a. 07/2019 Thoracentesis: 300 ml.    Social History   Tobacco Use  . Smoking status: Current Some Day Smoker    Packs/day: 0.25    Years: 20.00    Pack years: 5.00    Types: Cigarettes  . Smokeless tobacco: Never Used  . Tobacco comment: a pack lasts a week (appx. 4 days)  Substance Use Topics  . Alcohol use: Never   No Known Allergies  Prior to Admission medications   Medication Sig Start  Date End Date Taking? Authorizing Provider  albuterol (VENTOLIN HFA) 108 (90 Base) MCG/ACT inhaler Inhale 2 puffs into the lungs every 6 (six) hours as needed for wheezing or shortness of breath. 09/08/19  Yes Darylene Price A, FNP  amLODipine (NORVASC) 10 MG tablet Take 1 tablet (10 mg total) by mouth daily. 07/25/20 10/23/20 Yes Iloabachie, Chioma E, NP  Blood Pressure KIT 1 kit by Does not apply route daily. 12/23/19  Yes Iloabachie, Chioma E, NP  BREO ELLIPTA 200-25 MCG/INH AEPB INHALE 1 PUFF INTO THE LUNGS DAILY 10/31/20  Yes Iloabachie, Chioma E, NP  carvedilol (COREG) 25 MG tablet Take 1 tablet (25 mg total) by mouth 2 (two) times daily. 04/27/20  Yes Darylene Price A, FNP  furosemide (LASIX) 40 MG tablet Take 0.5 tablets (20 mg total) by mouth daily. Patient taking differently: Take 40 mg by mouth daily. 06/12/20  Yes Patrecia Pour, MD  gabapentin (NEURONTIN) 300 MG capsule Take 300 mg by mouth 2 (two) times daily.   Yes [provider]  lidocaine (LIDODERM) 5 % Place 1 patch onto the skin every 12 (twelve) hours. Remove & Discard patch within 12 hours or as directed by MD 08/30/20 08/30/21 Yes Nance Pear, MD  methocarbamol (ROBAXIN) 500 MG tablet Take 1 tablet (500 mg total) by mouth 2 (two) times daily.  08/30/20  Yes Abate, Desta A, NP  pravastatin (PRAVACHOL) 20 MG tablet Take 1 tablet (20 mg total) by mouth daily. 05/04/20  Yes Iloabachie, Chioma E, NP  sacubitril-valsartan (ENTRESTO) 97-103 MG Take 1 tablet by mouth 2 (two) times daily. 04/27/20  Yes Darylene Price A, FNP  spironolactone (ALDACTONE) 25 MG tablet Take 1 tablet (25 mg total) by mouth daily. 04/27/20  Yes Darylene Price A, FNP  tiotropium (SPIRIVA) 18 MCG inhalation capsule Place 1 capsule (18 mcg total) into inhaler and inhale daily. 12/23/19  Yes Iloabachie, Chioma E, NP   Review of Systems  Constitutional: Negative for appetite change and fatigue.  HENT: Negative for congestion, postnasal drip and sore throat.   Eyes:  Negative.   Respiratory: Negative for cough, chest tightness and shortness of breath.   Cardiovascular: Negative for chest pain, palpitations and leg swelling.  Gastrointestinal: Negative for abdominal distention and abdominal pain.  Endocrine: Negative.   Genitourinary: Negative.   Musculoskeletal: Positive for myalgias (right thigh). Negative for back pain and neck pain.  Skin: Negative.   Allergic/Immunologic: Negative.   Neurological: Negative for dizziness and light-headedness.  Hematological: Negative for adenopathy. Does not bruise/bleed easily.  Psychiatric/Behavioral: Negative for dysphoric mood and sleep disturbance (sleeping on 1 pillows). The patient is not nervous/anxious.    Vitals:   11/13/20 1211  BP: 115/80  Pulse: (!) 52  Weight: 188 lb (85.3 kg)   Wt Readings from Last 3 Encounters:  11/13/20 188 lb (85.3 kg)  10/23/20 185 lb (83.9 kg)  08/30/20 186 lb 1.1 oz (84.4 kg)   Lab Results  Component Value Date   CREATININE 0.98 10/04/2020   CREATININE 0.98 07/01/2020   CREATININE 1.46 (H) 06/12/2020    Physical Exam Vitals and nursing note reviewed.  Constitutional:      Appearance: Normal appearance.  HENT:     Head: Normocephalic and atraumatic.  Neck:     Vascular: No carotid bruit.  Cardiovascular:     Rate and Rhythm: Normal rate and regular rhythm.  Pulmonary:     Effort: Pulmonary effort is normal. No respiratory distress.     Breath sounds: No wheezing or rales.  Abdominal:     General: Abdomen is flat. There is no distension.     Palpations: Abdomen is soft.  Musculoskeletal:        General: No tenderness.     Cervical back: Normal range of motion and neck supple.     Right lower leg: No tenderness. No edema.     Left lower leg: No tenderness. No edema.  Skin:    General: Skin is warm and dry.  Neurological:     General: No focal deficit present.     Mental Status: He is alert and oriented to person, place, and time.  Psychiatric:         Mood and Affect: Mood normal.        Behavior: Behavior normal.    Assessment & Plan:  1: Chronic heart failure with now preserved ejection fraction along with structural changes- - NYHA class I - euvolemic today - weighing daily and notes a gradual weight gain; reminded to call for an overnight weight gain of >2 pounds or a weekly weight gain of >5 pounds - weight up 6 pounds from last visit here 3 months ago - not adding salt and has been trying to read food labels; reviewed the importance of closely following a 2070m sodium diet  - saw cardiology ([facility]-Etang) 01/14/20 -  on GDMT of carvedilol, entresto and spironolactone - participating in paramedicine program - BNP 08/03/2019 was 2085.0 - patient says that he's received both COVID vaccines  2: HTN- - BP looks good today - saw PCP at [facility] 08/30/20 - BMP on 10/04/20 reviewed and showed sodium 141, potassium 3.8, creatinine 0.98 and GFR 94  3: COPD-  - using nebulizer and inhalers - smoking 2 cigarettes daily   Patient did not bring his medications nor a list. Each medication was verbally reviewed with the patient and he was encouraged to bring the bottles to every visit to confirm accuracy of list.  Return in 6 months or sooner for any questions/problems before then.

## 2020-11-13 NOTE — Patient Instructions (Signed)
Continue weighing daily and call for an overnight weight gain of > 2 pounds or a weekly weight gain of >5 pounds. 

## 2020-11-22 ENCOUNTER — Other Ambulatory Visit (HOSPITAL_COMMUNITY): Payer: Self-pay

## 2020-11-22 ENCOUNTER — Encounter (HOSPITAL_COMMUNITY): Payer: Self-pay

## 2020-11-22 NOTE — Progress Notes (Signed)
Today had a home visit with Kevin [name].  He states he got into some trouble.  He states was arrested at home and took to [place] where he was in trouble awhile back.  He states was there for 4 days without meds,  His blood pressure went over 200's, they gave him something for his blood pressure and brought it down a little.  He has been back home 1 day, he started back on meds yesterday.  BP is good today.  He states seen the [place] and charges have been dismissed.  He states glad to be home.  He is aware of up coming appts.  He has all his medications.  He has been filling his med boxes up, he appears to be doing them right.  He denies any problems today such as chest pain, headaches or dizziness.  Lungs clear, he has some edema in legs.  Will continue to visit for heart failure, diet and medication compliance.   [name] [place] EMT-Paramedic [phone]

## 2020-11-27 ENCOUNTER — Other Ambulatory Visit: Payer: Self-pay

## 2020-12-06 ENCOUNTER — Other Ambulatory Visit: Payer: Self-pay

## 2020-12-08 ENCOUNTER — Other Ambulatory Visit: Payer: Self-pay

## 2020-12-13 ENCOUNTER — Other Ambulatory Visit (HOSPITAL_COMMUNITY): Payer: Self-pay

## 2020-12-13 NOTE — Progress Notes (Signed)
Able to get up with Kevin [name] at the hospital today.  Met outside, Kevin [name] has been there for a few days and hopefully going home tomorrow.  Discussed the need for him to do Kevin paperwork with medication management.  Advised him he could take a courtesy car to medication management while he is here to fill out paperwork.  Advised him to ask for Kevin [name] at 1:30.  He states he will do that today.  He states has Kevin medications and he has been doing good.  Kevin did advise that he did come and paperwork was filled out.  Will continue to visit for heart failure, diet and medication compliance.     [place] EMT-Paramedic [phone] 

## 2020-12-19 ENCOUNTER — Other Ambulatory Visit (HOSPITAL_COMMUNITY): Payer: Self-pay

## 2020-12-27 ENCOUNTER — Telehealth: Payer: Self-pay | Admitting: Pharmacist

## 2020-12-27 NOTE — Telephone Encounter (Signed)
12/27/2020 2:14:47 PM - Patient to return letter of support  -- Kevin [name] - Wednesday, Dec 27, 2020 2:11 PM --Patient in office on 12/14/20 and I helped him complete Re-Certification paperwork, Patient needs to return letter of support-his mother is currently in the hospital. He also signed [employer]/Entresto & [employer]/Spiriva. Unable to submit till letter of support is returned.  Patient stated he has 3 Breo Ellipta inhalers at home-I requested he call us when he starts on the last inhaler.

## 2021-01-09 ENCOUNTER — Other Ambulatory Visit: Payer: Self-pay

## 2021-01-09 MED FILL — Fluticasone Furoate-Vilanterol Aero Powd BA 200-25 MCG/ACT: RESPIRATORY_TRACT | 60 days supply | Qty: 60 | Fill #0 | Status: CN

## 2021-01-15 ENCOUNTER — Other Ambulatory Visit: Payer: Self-pay

## 2021-01-26 ENCOUNTER — Telehealth: Payer: Self-pay | Admitting: Family

## 2021-01-26 NOTE — Telephone Encounter (Signed)
Called and notified patient that he has been approved for [employer] patient assistance for Entresto till 12/2021. I told patient to call and set up delivery and instructions on how to do so.   Grant Henkes, NT

## 2021-02-19 ENCOUNTER — Other Ambulatory Visit: Payer: Self-pay

## 2021-02-19 MED FILL — Fluticasone Furoate-Vilanterol Aero Powd BA 200-25 MCG/ACT: RESPIRATORY_TRACT | 30 days supply | Qty: 60 | Fill #0 | Status: CN

## 2021-02-20 ENCOUNTER — Other Ambulatory Visit (HOSPITAL_COMMUNITY): Payer: Self-pay

## 2021-02-20 ENCOUNTER — Telehealth: Payer: Self-pay | Admitting: Pharmacy Technician

## 2021-02-20 NOTE — Progress Notes (Signed)
Today tried to visit with Kevin [name].  He was walking down the street.  He states he was walking to the store.  He states his electric scooter motor gave out.  Will try to find someone to look at it for him.  He appears to be good, he states been feeling great.  He states has all his medications.  He just got his Mom up and fed breakfast and he was taking off for a bit.  Will try to catch him at home another day.  He stays active and very busy.    [name] [place] EMT-Paramedic [phone]

## 2021-02-20 NOTE — Telephone Encounter (Signed)
Attempted to call patient to discuss with patient his writing a letter describing his financial situation.  Unable to reach patient.  Left HIPPA message.  Sherilyn Dacosta Care Manager [facility]

## 2021-03-15 ENCOUNTER — Other Ambulatory Visit: Payer: Self-pay

## 2021-03-21 ENCOUNTER — Other Ambulatory Visit: Payer: Self-pay

## 2021-03-22 ENCOUNTER — Other Ambulatory Visit (HOSPITAL_COMMUNITY): Payer: Self-pay

## 2021-03-22 NOTE — Progress Notes (Signed)
Spoke with Kevin [name] today.  He is doing well.  He states has all his medications.  Advised him Kevin [name] with medication management attempted to call him, he was unaware. I contacted Kevin [name] to see if she could fax me any paperwork and I will take it to him on Monday next week.  He denies any problems.  He states has everything for daily living.  Will visit for heart failure, diet and medication management.   Kevin [name] [place] EMT-Paramedic [phone]

## 2021-03-27 ENCOUNTER — Encounter (HOSPITAL_COMMUNITY): Payer: Self-pay

## 2021-03-27 ENCOUNTER — Other Ambulatory Visit (HOSPITAL_COMMUNITY): Payer: Self-pay

## 2021-03-27 NOTE — Progress Notes (Signed)
Today had a home visit with Kevin [name].  He states been doing good.  He has all his medications, his med box all his morning meds were taken but night time meds were in there.  He states was working on filling it up.  He has a lot of meds and bottles says last time filled was end of March.  He states been combining bottles.  He states has been taking them.  He can tell me how to take all his meds.  Advised him he needs to go by Medication management to get a paper notarized, he states will go tomorrow.  He also needs refill on gabapentin.  He denies any problems such as chest pain, dizziness, headaches or increased shortness of breath.  He is walking with his walker.  He stats his scooter is broke, he states would like to change from [facility] to a physician, gave him a name of physician to set up appt.  He states he needs one for his mother also.  He will make both appts when calls.  He care for his mother.  He appears healthy and he stays active taking care of her and his self.  They live together.  They have plenty of daily supplies.  Will continue to visit for heart failure, diet and medication management.   [name] EMT-Paramedic [phone]

## 2021-03-28 ENCOUNTER — Other Ambulatory Visit: Payer: Self-pay

## 2021-04-16 ENCOUNTER — Other Ambulatory Visit (HOSPITAL_COMMUNITY): Payer: Self-pay

## 2021-04-16 NOTE — Progress Notes (Signed)
Today went to see Kevin [name], he is waiting outside for transportation to come to take him to medication management.  Advised him again to see Kathie Rhodes to fill out his forms for his medication.  He states doing ok, will make another home visit to check on him.   [name] [place] EMT-Paramedic [phone]

## 2021-04-17 ENCOUNTER — Other Ambulatory Visit: Payer: Self-pay

## 2021-04-17 ENCOUNTER — Telehealth: Payer: Self-pay | Admitting: Pharmacy Technician

## 2021-04-17 MED FILL — Pravastatin Sodium Tab 20 MG: ORAL | 30 days supply | Qty: 30 | Fill #0 | Status: AC

## 2021-04-17 NOTE — Telephone Encounter (Signed)
Received updated proof of income.  Patient eligible to receive medication assistance at [facility] until time for re-certification in 2023, and as long as eligibility requirements continue to be met.  Kevin [name] [place] Care Manager [facility]  

## 2021-04-19 ENCOUNTER — Other Ambulatory Visit: Payer: Self-pay

## 2021-04-19 ENCOUNTER — Telehealth: Payer: Self-pay | Admitting: Pharmacist

## 2021-04-19 NOTE — Telephone Encounter (Signed)
04/19/2021 12:18:03 PM - Virgel Bouquet & Spiriva pending  -- Rhetta Mura - Thursday, April 19, 2021 12:17 PM --Lanora Manis came by office today and signed her forms, holding now for patient to return his forms, just put patient's portion in outgoing mail today.

## 2021-04-19 NOTE — Telephone Encounter (Signed)
04/19/2021 10:16:07 AM - Virgel Bouquet Ellipta & Sprivia forms to dr & pat.  -- Rhetta Mura - Thursday, April 19, 2021 10:14 AM --Patient has provided [place] with a statement "No Income", reprinted the Spiriva & Breo forms-mailing patient his portion to sign & return, also sending to [facility] for Lanora Manis to sign.

## 2021-04-26 ENCOUNTER — Other Ambulatory Visit: Payer: Self-pay

## 2021-05-15 ENCOUNTER — Other Ambulatory Visit (HOSPITAL_COMMUNITY): Payer: Self-pay

## 2021-05-15 NOTE — Progress Notes (Signed)
Showed up at apartment and no answer when knocked on door or rang doorbell.  Neighbor states has not seen him this morning. No answer by phone.   [name] [place] EMT-Paramedic [phone]

## 2021-05-16 ENCOUNTER — Other Ambulatory Visit: Payer: Self-pay

## 2021-05-16 ENCOUNTER — Encounter: Payer: Self-pay | Admitting: Family

## 2021-05-16 ENCOUNTER — Ambulatory Visit: Payer: Medicaid Other | Attending: Family | Admitting: Family

## 2021-05-16 VITALS — BP 141/92 | HR 60 | Resp 18 | Ht 68.0 in | Wt 181.4 lb

## 2021-05-16 DIAGNOSIS — I5042 Chronic combined systolic (congestive) and diastolic (congestive) heart failure: Secondary | ICD-10-CM

## 2021-05-16 DIAGNOSIS — J449 Chronic obstructive pulmonary disease, unspecified: Secondary | ICD-10-CM

## 2021-05-16 DIAGNOSIS — Z7951 Long term (current) use of inhaled steroids: Secondary | ICD-10-CM | POA: Insufficient documentation

## 2021-05-16 DIAGNOSIS — Z79899 Other long term (current) drug therapy: Secondary | ICD-10-CM | POA: Insufficient documentation

## 2021-05-16 DIAGNOSIS — I110 Hypertensive heart disease with heart failure: Secondary | ICD-10-CM | POA: Insufficient documentation

## 2021-05-16 DIAGNOSIS — I10 Essential (primary) hypertension: Secondary | ICD-10-CM

## 2021-05-16 DIAGNOSIS — F17210 Nicotine dependence, cigarettes, uncomplicated: Secondary | ICD-10-CM | POA: Insufficient documentation

## 2021-05-16 NOTE — Patient Instructions (Signed)
Continue weighing daily and call for an overnight weight gain of > 2 pounds or a weekly weight gain of >5 pounds. 

## 2021-05-16 NOTE — Progress Notes (Signed)
Patient ID: Kevin [name], male    DOB: 25-Mar-1956, 65 y.o.   MRN: [id number]  Mr Wegmann is a 65 y/o male with a history of HTN, COPD, tobacco use and chronic heart failure.   Echo report from 01/13/20 reviewed and showed an EF of 50-55% along with moderate LVH. Echo report from 08/04/2019 reviewed and showed an EF of 25-30% along with mild/moderate TR.   Has not been admitted or been in the ED in the last 6 months.   He presents today for a follow-up visit with a chief complaint of right thigh pain. He says that this occurs on an intermittent basis but is much improved from previous times. He has no other symptoms and specifically denies any difficulty sleeping, dizziness, fatigue, cough, shortness of breath, chest pain, pedal edema, palpitations, abdominal distention or weight gain.   Says that he's most concerned about his mom's skin breakdown that she has on the back of her thighs. She is unable to stand and he has to use a hoyer lift to transfer her from bed to chair. He does try to prop her up on her side with a pillow but she eventually gets the pillow out.  Past Medical History:  Diagnosis Date   Chronic combined systolic (congestive) and diastolic (congestive) heart failure ([place])    a. 2019 reported neg stress test and nl cors on cath in [facility]; b. 07/2019 Echo: EF 25-30%, Gr2 DD, glob HK. Mod reduced RV fxn w/ volume overload. Mod dil RA. Mild to mod TR. Mod elev PASP.   COPD (chronic obstructive pulmonary disease) (HCC)    Diabetes mellitus without complication (HCC)    Hypertension    Noncompliance    Pleural effusion on right    a. 07/2019 Thoracentesis: 300 ml.    Social History   Tobacco Use   Smoking status: Some Days    Packs/day: 0.25    Years: 20.00    Pack years: 5.00    Types: Cigarettes   Smokeless tobacco: Never   Tobacco comments:    a pack lasts a week (appx. 4 days)  Substance Use Topics   Alcohol use: Never   No Known Allergies  Prior to Admission  medications   Medication Sig Start Date End Date Taking? Authorizing Provider  albuterol (VENTOLIN HFA) 108 (90 Base) MCG/ACT inhaler Inhale 2 puffs into the lungs every 6 (six) hours as needed for wheezing or shortness of breath. 09/08/19  Yes Thiago Ragsdale A, FNP  amLODipine (NORVASC) 10 MG tablet TAKE ONE TABLET BY MOUTH EVERY DAY 07/25/20 07/25/21 Yes Iloabachie, Chioma E, NP  Blood Pressure KIT 1 kit by Does not apply route daily. 12/23/19  Yes Iloabachie, Chioma E, NP  carvedilol (COREG) 25 MG tablet TAKE ONE TABLET BY MOUTH 2 TIMES A DAY 04/27/20  Yes Alexsa Flaum A, FNP  fluticasone furoate-vilanterol (BREO ELLIPTA) 200-25 MCG/INH AEPB INHALE 1 PUFF INTO THE LUNGS ONCE DAILY. 10/31/20 10/31/21 Yes Iloabachie, Chioma E, NP  furosemide (LASIX) 40 MG tablet Take 0.5 tablets (20 mg total) by mouth daily. Patient taking differently: Take 40 mg by mouth daily. 06/12/20  Yes Patrecia Pour, MD  gabapentin (NEURONTIN) 300 MG capsule Take 300 mg by mouth 2 (two) times daily.   Yes [provider]  pravastatin (PRAVACHOL) 20 MG tablet TAKE ONE TABLET BY MOUTH EVERY DAY 05/04/20 05/17/21 Yes Iloabachie, Chioma E, NP  sacubitril-valsartan (ENTRESTO) 97-103 MG Take 1 tablet by mouth 2 (two) times daily.  Yes [provider]  spironolactone (ALDACTONE) 25 MG tablet TAKE ONE TABLET BY MOUTH EVERY DAY 04/27/20  Yes Darylene Price A, FNP  tiotropium (SPIRIVA) 18 MCG inhalation capsule Place 1 capsule (18 mcg total) into inhaler and inhale daily. 12/23/19  Yes Iloabachie, Chioma E, NP    Review of Systems  Constitutional:  Negative for appetite change and fatigue.  HENT:  Negative for congestion, postnasal drip and sore throat.   Eyes: Negative.   Respiratory:  Negative for cough, chest tightness and shortness of breath.   Cardiovascular:  Negative for chest pain, palpitations and leg swelling.  Gastrointestinal:  Negative for abdominal distention and abdominal pain.  Endocrine: Negative.    Genitourinary: Negative.   Musculoskeletal:  Positive for myalgias (right thigh). Negative for back pain and neck pain.  Skin: Negative.   Allergic/Immunologic: Negative.   Neurological:  Negative for dizziness and light-headedness.  Hematological:  Negative for adenopathy. Does not bruise/bleed easily.  Psychiatric/Behavioral:  Negative for dysphoric mood and sleep disturbance (sleeping on 1 pillows). The patient is not nervous/anxious.    Vitals:   05/16/21 1233  BP: (!) 141/92  Pulse: 60  Resp: 18  SpO2: 98%  Weight: 181 lb 6 oz (82.3 kg)  Height: '5\' 8"'  (1.727 m)   Wt Readings from Last 3 Encounters:  05/16/21 181 lb 6 oz (82.3 kg)  03/27/21 188 lb (85.3 kg)  11/13/20 188 lb (85.3 kg)   Lab Results  Component Value Date   CREATININE 0.98 10/04/2020   CREATININE 0.98 07/01/2020   CREATININE 1.46 (H) 06/12/2020    Physical Exam Vitals and nursing note reviewed.  Constitutional:      Appearance: Normal appearance.  HENT:     Head: Normocephalic and atraumatic.  Neck:     Vascular: No carotid bruit.  Cardiovascular:     Rate and Rhythm: Normal rate and regular rhythm.  Pulmonary:     Effort: Pulmonary effort is normal. No respiratory distress.     Breath sounds: No wheezing or rales.  Abdominal:     General: Abdomen is flat. There is no distension.     Palpations: Abdomen is soft.  Musculoskeletal:        General: No tenderness.     Cervical back: Normal range of motion and neck supple.     Right lower leg: No tenderness. No edema.     Left lower leg: No tenderness. No edema.  Skin:    General: Skin is warm and dry.  Neurological:     General: No focal deficit present.     Mental Status: He is alert and oriented to person, place, and time.  Psychiatric:        Mood and Affect: Mood normal.        Behavior: Behavior normal.   Assessment & Plan:  1: Chronic heart failure with preserved ejection fraction along with structural changes- - NYHA class I -  euvolemic today - weighing daily; reminded to call for an overnight weight gain of >2 pounds or a weekly weight gain of >5 pounds - weight down 7 pounds from last visit here 6 months ago - not adding salt and has been trying to read food labels; reviewed the importance of closely following a 2064m sodium diet  - saw cardiology ([facility]) 01/14/20 - on GDMT of carvedilol, entresto and spironolactone - participating in paramedicine program - BNP 08/03/2019 was 2085.0  2: HTN- - BP mildly elevated today (141/92) - saw PCP at [facility] 08/30/20 - BMP on 10/04/20 reviewed and showed sodium 141, potassium 3.8, creatinine 0.98 and GFR 94  3: COPD-  - using nebulizer and inhalers - smoking 2 cigarettes daily  Medication bottles reviewed.   Return in 3 months or sooner for any questions/problems before then.

## 2021-05-21 ENCOUNTER — Other Ambulatory Visit (HOSPITAL_COMMUNITY): Payer: Self-pay

## 2021-05-21 NOTE — Progress Notes (Signed)
Today made a home visit with Merlyn Albert.  He states doing ok.  He is catching the bus shortly to go to appt with his Mom.  He is ambulatory in the parking lot without walker.  He states has all his medications and he is aware of how to take them.  He has been placing them in his box.  He denies weighing today.  He states he has lost.  Edema has gone down.  He states he walks a lot during the day since his scooter broke.  He has not made a PCP appt yet, reminded him.  He states he will today.  He denies any chest pain, headaches, dizziness or increased shortness of breath.  He states has everything for daily living.  He was inquiring about a free cell phone, advised him he would have to go to Social services and see if he can get one.  Advised him where to go.  He states he will check on it.  He is aware of up coming appt.  Will continue to visit for heart failure, diet and medication compliance.   [name] [place] EMT-Paramedic [phone]

## 2021-05-23 ENCOUNTER — Telehealth: Payer: Self-pay | Admitting: Gerontology

## 2021-05-23 NOTE — Telephone Encounter (Signed)
-----   Message from Rolm Gala, NP sent at 05/17/2021  4:54 PM EDT ----- Mr Kevin [name] no showed to his appointments,pls schedule an in clinic follow up appointment and make telephone note.

## 2021-06-13 ENCOUNTER — Other Ambulatory Visit (HOSPITAL_COMMUNITY): Payer: Self-pay

## 2021-06-13 ENCOUNTER — Other Ambulatory Visit: Payer: Self-pay | Admitting: Family

## 2021-06-13 ENCOUNTER — Other Ambulatory Visit: Payer: Self-pay

## 2021-06-13 DIAGNOSIS — E785 Hyperlipidemia, unspecified: Secondary | ICD-10-CM

## 2021-06-13 MED ORDER — PRAVASTATIN SODIUM 20 MG PO TABS
ORAL_TABLET | Freq: Every day | ORAL | 3 refills | Status: DC
  Filled 2021-06-13 – 2022-01-18 (×2): qty 90, 90d supply, fill #0

## 2021-06-13 NOTE — Progress Notes (Signed)
[facility]

## 2021-06-13 NOTE — Progress Notes (Signed)
Today had a home visit with Kevin [name].  He was outside, met outside.  He said he has been doing good.  Worries about his mother.  Talked a lot about her.  Assisted him with getting her a PCP appt tomorrow and transportation to get her there.  He wanted to go to her also but he does not have medicaid yet.  I explained to him to go to [facility], see them to get his meds refilled and go to social services to check on his disability, it has been over 2 years.  He needs social work help with paperwork and what to do.  He has all his medications except for pravachol, Tina with [name] is sending in refill. He is aware of how to take them and he has been filling his med boxes.  He is ambulatory by cane when arrived and switched to rollator later in visit.  He appears to be in a good mood at end of appt, he talked about the stress of taking care of his mother.  Advised him to talk to her PCP tomorrow about getting help with bathing.  He states he can only do so much with his medical condition.  He has everything for daily living.  Will continue to visit for heart failure, diet and medication management.   [place] [phone]

## 2021-07-03 ENCOUNTER — Telehealth: Payer: Self-pay | Admitting: Pharmacist

## 2021-07-03 NOTE — Telephone Encounter (Signed)
--   Rhetta Mura - Tuesday, July 03, 2021 9:49 AM --Working follow up - had previously 04/19/21 mailed patient forms for Breo & Spiriva to sign & Not returned.  Reprinted today and put in bag with other med-Pravastatin, for patient to sign --then I will get provider to resign.

## 2021-07-23 ENCOUNTER — Encounter (HOSPITAL_COMMUNITY): Payer: Self-pay

## 2021-07-23 ENCOUNTER — Other Ambulatory Visit (HOSPITAL_COMMUNITY): Payer: Self-pay

## 2021-07-23 NOTE — Progress Notes (Signed)
Today had a home visit with Kevin [name].  He was outside cleaning things from inside the home.  He keeps a very tighty home.  He lives with his Mom, and is her caregiver.  He appears to be ambulating better, he goes short distances without walker.  Longer walks he takes it.  He states been feeling good.  He denies any problems today, such as chest pain, headaches, dizziness or increased shortness of breath.  He has all his medications and aware before he runs out to go get more.  He gets it from medications management around the middle of the month.  He has not heard from medicaid or disability, advised him to go to Social services and check on it.  He states he will if they let him in, he says they do not let you in because of covid, you have to call.  Checked website and he is correct, he states will call again.  He states has everything for daily living.  He is aware of any up coming appts. Will continue to visit for heart failure, diet and medication compliance.   [name] [place] EMT-Paramedic [phone]

## 2021-08-02 ENCOUNTER — Other Ambulatory Visit (HOSPITAL_COMMUNITY): Payer: Self-pay

## 2021-08-03 ENCOUNTER — Telehealth: Payer: Self-pay | Admitting: Pharmacist

## 2021-08-03 ENCOUNTER — Encounter (HOSPITAL_COMMUNITY): Payer: Self-pay

## 2021-08-03 ENCOUNTER — Other Ambulatory Visit: Payer: Self-pay

## 2021-08-03 NOTE — Progress Notes (Signed)
Today Kevin [name] had a home visit with Kevin [name] and eported back to me the findings.  He sad he is doing well.  He is getting more and more ambulatory without assistance.  He care for his Mom, they live together.  He is aware of how to take his medications and has all his medications right now.  He is running low on spironolactone and pravastatin.  He is aware to go to medication management to get refills.  He takes transportation there.  He will go in the next couple of days.  He denies having any problems such as chest pain, headaches, dizziness or increased shortness of breath.  Lungs are clear and edema is down.  Will continue to visit for heart failure, diet and medication compliance.   [name] [place] EMT-Paramedic [phone]

## 2021-08-03 NOTE — Telephone Encounter (Signed)
08/03/2021 12:58:16 PM - Kevin [name] & Spiriva forms back-pt did not p/u meds  -- Rhetta Mura - Friday, August 03, 2021 12:54 PM --Prev. on 07/03/21 I put applications for Breo Ellipta & Spiriva in bag with other med (Pravastatin) for patient to sign when he picked up this med. I have today received the forms back due to patient had Not picked up Pravastatin and it is being returned to stock.  According to [facility] patient has Family Planning Medicaid--patient has appointment 08/15/21 with Clarisa [name] and 08/28/21 with Particia Lather note that patient has Medicaid.

## 2021-08-15 ENCOUNTER — Ambulatory Visit: Payer: Medicaid Other | Admitting: Family

## 2021-08-28 ENCOUNTER — Ambulatory Visit: Payer: Self-pay | Admitting: Nurse Practitioner

## 2021-09-03 NOTE — Progress Notes (Deleted)
Patient ID: Kevin [name], male    DOB: 20-Sep-1955, 66 y.o.   MRN: [id number]  Kevin [name] is a 66 y/o male with a history of HTN, COPD, tobacco use and chronic heart failure.   Echo report from 06/12/20 reviewed and showed an EF of 45-50% along with severe LVH. Echo report from 01/13/20 reviewed and showed an EF of 50-55% along with moderate LVH. Echo report from 08/04/2019 reviewed and showed an EF of 25-30% along with mild/moderate TR.   Has not been admitted or been in the ED in the last 6 months.   Kevin [name] presents today for a follow-up visit with a chief complaint of   Past Medical History:  Diagnosis Date   Chronic combined systolic (congestive) and diastolic (congestive) heart failure ([place])    a. 2019 reported neg stress test and nl cors on cath in [facility]; b. 07/2019 Echo: EF 25-30%, Gr2 DD, glob HK. Mod reduced RV fxn w/ volume overload. Mod dil RA. Mild to mod TR. Mod elev PASP.   COPD (chronic obstructive pulmonary disease) (HCC)    Diabetes mellitus without complication (HCC)    Hypertension    Noncompliance    Pleural effusion on right    a. 07/2019 Thoracentesis: 300 ml.    Social History   Tobacco Use   Smoking status: Some Days    Packs/day: 0.25    Years: 20.00    Pack years: 5.00    Types: Cigarettes   Smokeless tobacco: Never   Tobacco comments:    a pack lasts a week (appx. 4 days)  Substance Use Topics   Alcohol use: Never   No Known Allergies    Review of Systems  Constitutional:  Negative for appetite change and fatigue.  HENT:  Negative for congestion, postnasal drip and sore throat.   Eyes: Negative.   Respiratory:  Negative for cough, chest tightness and shortness of breath.   Cardiovascular:  Negative for chest pain, palpitations and leg swelling.  Gastrointestinal:  Negative for abdominal distention and abdominal pain.  Endocrine: Negative.   Genitourinary: Negative.   Musculoskeletal:  Positive for myalgias (right thigh). Negative for back pain  and neck pain.  Skin: Negative.   Allergic/Immunologic: Negative.   Neurological:  Negative for dizziness and light-headedness.  Hematological:  Negative for adenopathy. Does not bruise/bleed easily.  Psychiatric/Behavioral:  Negative for dysphoric mood and sleep disturbance (sleeping on 1 pillows). The patient is not nervous/anxious.      Physical Exam Vitals and nursing note reviewed.  Constitutional:      Appearance: Normal appearance.  HENT:     Head: Normocephalic and atraumatic.  Neck:     Vascular: No carotid bruit.  Cardiovascular:     Rate and Rhythm: Normal rate and regular rhythm.  Pulmonary:     Effort: Pulmonary effort is normal. No respiratory distress.     Breath sounds: No wheezing or rales.  Abdominal:     General: Abdomen is flat. There is no distension.     Palpations: Abdomen is soft.  Musculoskeletal:        General: No tenderness.     Cervical back: Normal range of motion and neck supple.     Right lower leg: No tenderness. No edema.     Left lower leg: No tenderness. No edema.  Skin:    General: Skin is warm and dry.  Neurological:     General: No focal deficit present.     Mental Status: Kevin [name] is alert  and oriented to person, place, and time.  Psychiatric:        Mood and Affect: Mood normal.        Behavior: Behavior normal.   Assessment & Plan:  1: Chronic heart failure with preserved ejection fraction along with structural changes- - NYHA class I - euvolemic today - weighing daily; reminded to call for an overnight weight gain of >2 pounds or a weekly weight gain of >5 pounds - weight 181.6 pounds from last visit here 3.5 months ago - not adding salt and has been trying to read food labels; reviewed the importance of closely following a 2000mg  sodium diet  - saw cardiology ([facility]) 01/14/20 - on GDMT of carvedilol, entresto and spironolactone - participating in paramedicine program - BNP 08/03/2019 was 2085.0  2: HTN- - BP  - saw PCP at  [facility] 08/30/20 - BMP on 10/04/20 reviewed and showed sodium 141, potassium 3.8, creatinine 0.98 and GFR 94  3: COPD-  - using nebulizer and inhalers - smoking 2 cigarettes daily   Medication bottles reviewed.

## 2021-09-04 ENCOUNTER — Ambulatory Visit: Payer: Medicaid Other | Admitting: Family

## 2021-09-06 ENCOUNTER — Other Ambulatory Visit (HOSPITAL_COMMUNITY): Payer: Self-pay

## 2021-09-06 NOTE — Progress Notes (Signed)
Today had a home visit to check on [place].  He missed his [name] due to transportation being late.  Have rescheduled it.  He states been doing fine.  He has everything he needs including all his medications.  He states setting up an appt with [facility] to be able to get his gabapentin.  He states stays busy caring for his bed ridden mother and enjoying outdoors on pretty warm days.  He is outside today, he was concerned with his mother because she came back from the hospital without her hoyer pad and now he can not move her.  Assisted him in ordering a new one for her.  He was very thankful.  He states had checked with several places in town and could not find one.  Will be delivered tomorrow morning.  Mobility has improved greatly in past several months.  He ambulates around mostly without assistance.  Will continue to visit for heart failure, diet and medication compliance.   [name] [place] EMT-Paramedic [phone]

## 2021-09-18 ENCOUNTER — Encounter: Payer: Self-pay | Admitting: Family

## 2021-09-18 ENCOUNTER — Ambulatory Visit: Payer: Medicaid Other | Attending: Family | Admitting: Family

## 2021-09-18 ENCOUNTER — Other Ambulatory Visit: Payer: Self-pay

## 2021-09-18 VITALS — BP 191/93 | HR 65 | Resp 18 | Ht 67.0 in | Wt 186.2 lb

## 2021-09-18 DIAGNOSIS — Z7951 Long term (current) use of inhaled steroids: Secondary | ICD-10-CM | POA: Insufficient documentation

## 2021-09-18 DIAGNOSIS — J449 Chronic obstructive pulmonary disease, unspecified: Secondary | ICD-10-CM | POA: Insufficient documentation

## 2021-09-18 DIAGNOSIS — I10 Essential (primary) hypertension: Secondary | ICD-10-CM

## 2021-09-18 DIAGNOSIS — F17210 Nicotine dependence, cigarettes, uncomplicated: Secondary | ICD-10-CM | POA: Insufficient documentation

## 2021-09-18 DIAGNOSIS — I5042 Chronic combined systolic (congestive) and diastolic (congestive) heart failure: Secondary | ICD-10-CM | POA: Insufficient documentation

## 2021-09-18 DIAGNOSIS — I110 Hypertensive heart disease with heart failure: Secondary | ICD-10-CM | POA: Insufficient documentation

## 2021-09-18 NOTE — Progress Notes (Signed)
Patient ID: Kevin [name], male    DOB: November 08, 1955, 66 y.o.   MRN: [id number]  Mr Forse is a 66 y/o male with a history of HTN, COPD, tobacco use and chronic heart failure.   Echo report from 01/13/20 reviewed and showed an EF of 50-55% along with moderate LVH. Echo report from 08/04/2019 reviewed and showed an EF of 25-30% along with mild/moderate TR.   Has not been admitted or been in the ED in the last 6 months.   He presents today for a follow-up visit with a chief complaint of right thigh pain. He says that this occurs on an intermittent basis but is much improved from previous times. He has no other symptoms and specifically denies any difficulty sleeping, dizziness, fatigue, cough, shortness of breath, chest pain, pedal edema, palpitations, abdominal distention or weight gain.   Says that he's most concerned about his mom's skin breakdown that she has on the back of her thighs. She is unable to stand and he has to use a hoyer lift to transfer her from bed to chair. He does try to prop her up on her side with a pillow but she eventually gets the pillow out. Since his mom has been in the hospital the last few days, he hasn't taken any of his medications because he didn't bring them to the hospital and he hasn't gone home since she was admitted.   Past Medical History:  Diagnosis Date   Chronic combined systolic (congestive) and diastolic (congestive) heart failure ([place])    a. 2019 reported neg stress test and nl cors on cath in [facility] b. 07/2019 Echo: EF 25-30%, Gr2 DD, glob HK. Mod reduced RV fxn w/ volume overload. Mod dil RA. Mild to mod TR. Mod elev PASP.   COPD (chronic obstructive pulmonary disease) (HCC)    Diabetes mellitus without complication (HCC)    Hypertension    Noncompliance    Pleural effusion on right    a. 07/2019 Thoracentesis: 300 ml.    Social History   Tobacco Use   Smoking status: Some Days    Packs/day: 0.25    Years: 20.00    Pack years: 5.00    Types:  Cigarettes   Smokeless tobacco: Never   Tobacco comments:    a pack lasts a week (appx. 4 days)  Substance Use Topics   Alcohol use: Never   No Known Allergies  Prior to Admission medications   Medication Sig Start Date End Date Taking? Authorizing Provider  albuterol (VENTOLIN HFA) 108 (90 Base) MCG/ACT inhaler Inhale 2 puffs into the lungs every 6 (six) hours as needed for wheezing or shortness of breath. 09/08/19  Yes Hackney, Tina A, FNP  amLODipine (NORVASC) 10 MG tablet TAKE ONE TABLET BY MOUTH EVERY DAY 07/25/20 07/25/21 Yes Iloabachie, Chioma E, NP  Blood Pressure KIT 1 kit by Does not apply route daily. 12/23/19  Yes Iloabachie, Chioma E, NP  carvedilol (COREG) 25 MG tablet TAKE ONE TABLET BY MOUTH 2 TIMES A DAY 04/27/20  Yes Hackney, Tina A, FNP  fluticasone furoate-vilanterol (BREO ELLIPTA) 200-25 MCG/INH AEPB INHALE 1 PUFF INTO THE LUNGS ONCE DAILY. 10/31/20 10/31/21 Yes Iloabachie, Chioma E, NP  furosemide (LASIX) 40 MG tablet Take 0.5 tablets (20 mg total) by mouth daily. Patient taking differently: Take 40 mg by mouth daily. 06/12/20  Yes Patrecia Pour, MD  gabapentin (NEURONTIN) 300 MG capsule Take 300 mg by mouth 2 (two) times daily.   Yes [provider]  pravastatin (PRAVACHOL) 20 MG tablet TAKE ONE TABLET BY MOUTH EVERY DAY 05/04/20 05/17/21 Yes Iloabachie, Chioma E, NP  sacubitril-valsartan (ENTRESTO) 97-103 MG Take 1 tablet by mouth 2 (two) times daily.   Yes [provider]  spironolactone (ALDACTONE) 25 MG tablet TAKE ONE TABLET BY MOUTH EVERY DAY 04/27/20  Yes Darylene Price A, FNP  tiotropium (SPIRIVA) 18 MCG inhalation capsule Place 1 capsule (18 mcg total) into inhaler and inhale daily. 12/23/19  Yes Iloabachie, Chioma E, NP    Review of Systems  Constitutional:  Negative for appetite change and fatigue.  HENT:  Negative for congestion, postnasal drip and sore throat.   Eyes: Negative.   Respiratory:  Negative for cough, chest tightness and shortness of  breath.   Cardiovascular:  Negative for chest pain, palpitations and leg swelling.  Gastrointestinal:  Negative for abdominal distention and abdominal pain.  Endocrine: Negative.   Genitourinary: Negative.   Musculoskeletal:  Negative for back pain, myalgias and neck pain.  Skin: Negative.   Allergic/Immunologic: Negative.   Neurological:  Negative for dizziness and light-headedness.  Hematological:  Negative for adenopathy. Does not bruise/bleed easily.  Psychiatric/Behavioral:  Negative for dysphoric mood and sleep disturbance (sleeping on 1 pillows). The patient is not nervous/anxious.    Vitals:   09/18/21 1303 09/18/21 1307  BP: (!) 207/104 (!) 191/93  Pulse: 65   Resp: 18   SpO2: 100%   Weight: 186 lb 4 oz (84.5 kg)   Height: _0  (1.702 m)    Wt Readings from Last 3 Encounters:  09/18/21 186 lb 4 oz (84.5 kg)  07/23/21 182 lb (82.6 kg)  06/13/21 182 lb (82.6 kg)   Lab Results  Component Value Date   CREATININE 0.98 10/04/2020   CREATININE 0.98 07/01/2020   CREATININE 1.46 (H) 06/12/2020    Physical Exam Vitals and nursing note reviewed.  Constitutional:      General: He is not in acute distress.    Appearance: Normal appearance.  HENT:     Head: Normocephalic and atraumatic.  Neck:     Vascular: No carotid bruit.  Cardiovascular:     Rate and Rhythm: Normal rate and regular rhythm.  Pulmonary:     Effort: Pulmonary effort is normal. No respiratory distress.     Breath sounds: No wheezing or rales.  Abdominal:     General: Abdomen is flat. There is no distension.     Palpations: Abdomen is soft.  Musculoskeletal:        General: No tenderness.     Cervical back: Normal range of motion and neck supple.     Right lower leg: No tenderness. No edema.     Left lower leg: No tenderness. No edema.  Skin:    General: Skin is warm and dry.  Neurological:     General: No focal deficit present.     Mental Status: He is alert and oriented to person, place, and  time.  Psychiatric:        Mood and Affect: Mood normal.        Behavior: Behavior normal.   Assessment & Plan:  1: Chronic heart failure with preserved ejection fraction along with structural changes- - NYHA class I - euvolemic today - weighing daily; reminded to call for an overnight weight gain of >2 pounds or a weekly weight gain of >5 pounds - weight 186, up 5 lbs from last visit in September - not adding salt and has been trying to read  food labels; reviewed the importance of closely following a 2071m sodium diet  - saw cardiology (Agbor-Etang) 01/14/20 - on GDMT of carvedilol, entresto and spironolactone - his mother has been in the hospital for several days and he has been staying around the clock inpatient with her, without his medications - provided sample of entresto for him to take until he can get back home - participating in paramedicine program - BNP 08/03/2019 was 2085.0  2: HTN- - BP 207/104, recheck 191/93 - provided sample of entresto for him to take until he can get back home - saw PCP at [facility] - BMP on 10/04/20 reviewed and showed sodium 141, potassium 3.8, creatinine 0.98 and GFR 94  3: COPD-  - using nebulizer and inhalers - smoking 2 cigarettes daily   He did not bring his medications or list. Verbally reviewed each. He typically takes as prescribed but due to his mothers hospitalization and transportation needs, he has not been able to take them.   Return in 2 weeks, or sooner if needed.

## 2021-09-18 NOTE — Patient Instructions (Signed)
The Heart Failure Clinic will be moving around the corner to [address] mid-February. Our phone number will remain the same.  While you are in the hospital with your mother, take 2 entresto, twice/day. (It is half the dose of your home).  When you return home, take 1 entresto once/day.  Return in one month or call sooner if needed.

## 2021-10-04 ENCOUNTER — Telehealth: Payer: Self-pay | Admitting: Emergency Medicine

## 2021-10-04 NOTE — Telephone Encounter (Signed)
-----   Message from Langston Reusing, NP sent at 10/02/2021  2:36 PM EST ----- Pls schedule an in clinic appointment for Mr Kevin [name], make telephone note. Thank you

## 2021-10-04 NOTE — Telephone Encounter (Signed)
Called patient and scheduled OV for Wed., 10/10/21 @ 12:00pm. Patient in agreement and states he will be here.

## 2021-10-10 ENCOUNTER — Ambulatory Visit: Payer: Medicaid Other | Admitting: Gerontology

## 2021-10-11 IMAGING — CT CT ANGIO CHEST
2 of 6 series · 18 of 46 positions shown · IV contrast (APPLIED)
Comparison: None

CLINICAL DATA: Short of breath

EXAM:
CT ANGIOGRAPHY CHEST WITH CONTRAST
TECHNIQUE: Multidetector CT imaging of the chest was performed using the
standard protocol during bolus administration of intravenous
contrast. Multiplanar CT image reconstructions and MIPs were
obtained to evaluate the vascular anatomy.
CONTRAST:  75mL OMNIPAQUE IOHEXOL 350 MG/ML SOLN

[Series 5: thins · axial · 0.74mm/px · z∈[-562,-295]mm · 16 of 293 slices shown]
[im 13/293  lung]
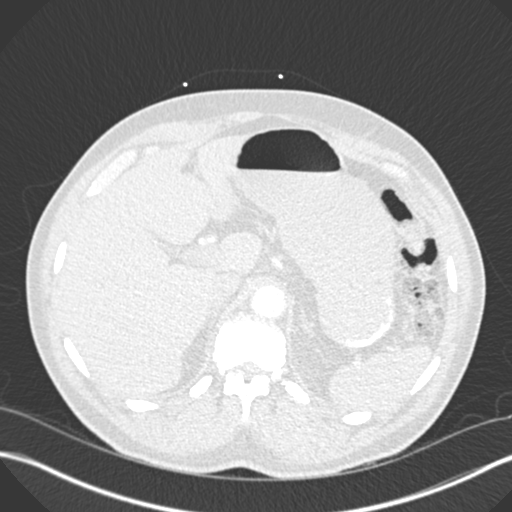
[im 39/293  soft-tissue]
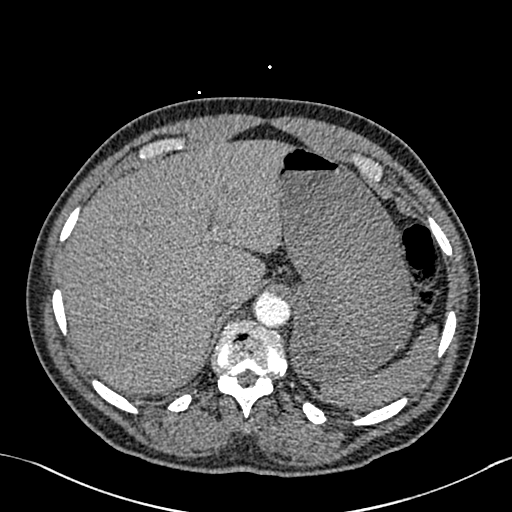
[im 51/293  lung]
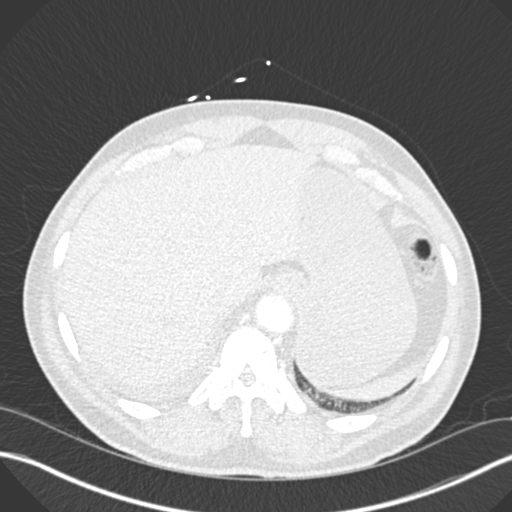
[im 64/293  soft-tissue]
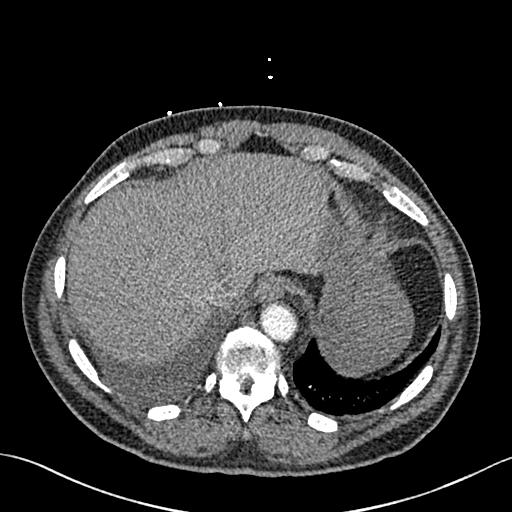
[im 89/293  lung]
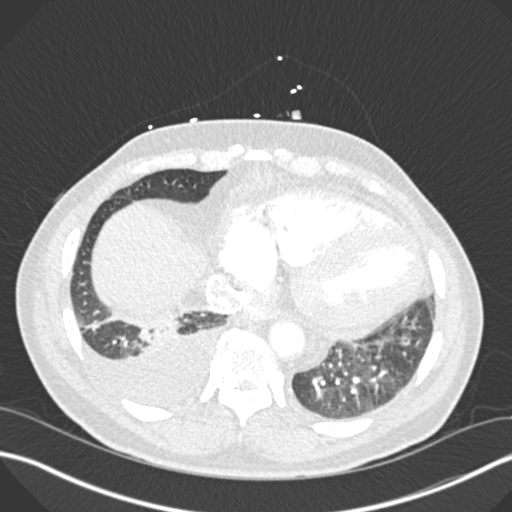
[im 102/293  soft-tissue]
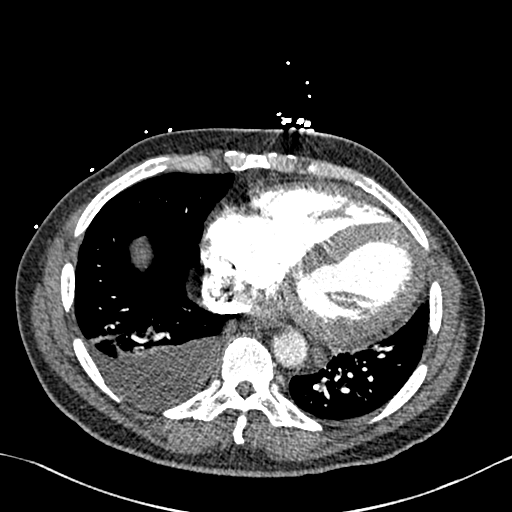
[im 115/293  lung]
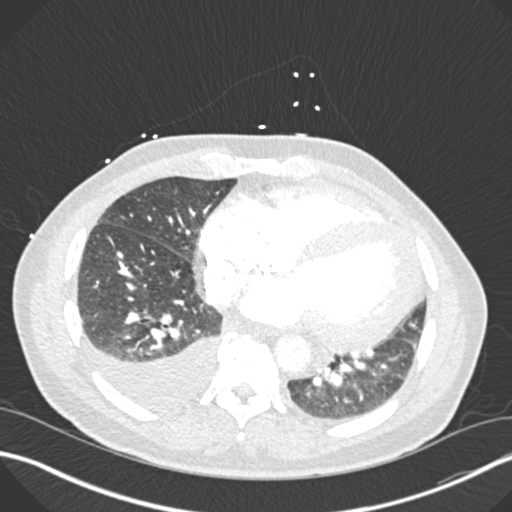
[im 140/293  soft-tissue]
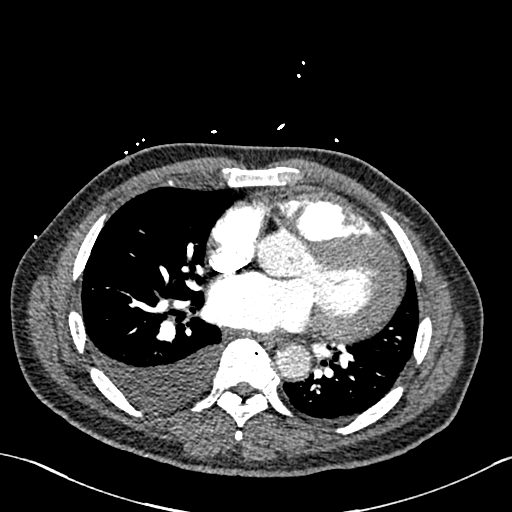
[im 153/293  lung]
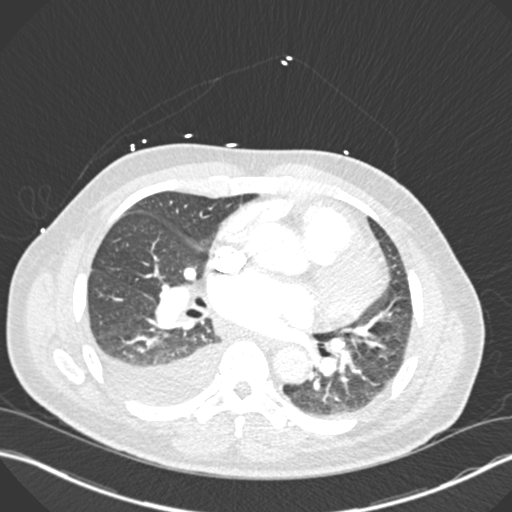
[im 178/293  soft-tissue]
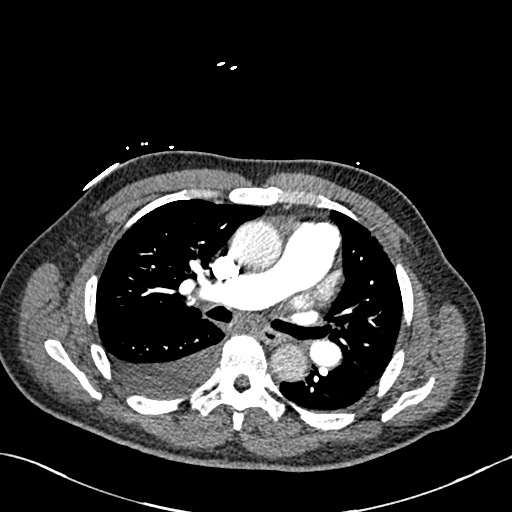
[im 191/293  lung]
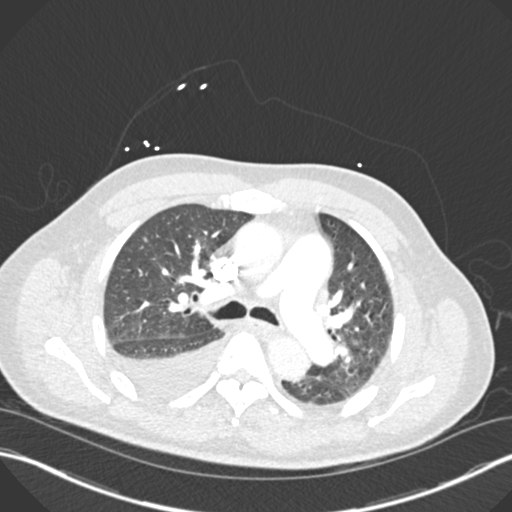
[im 204/293  soft-tissue]
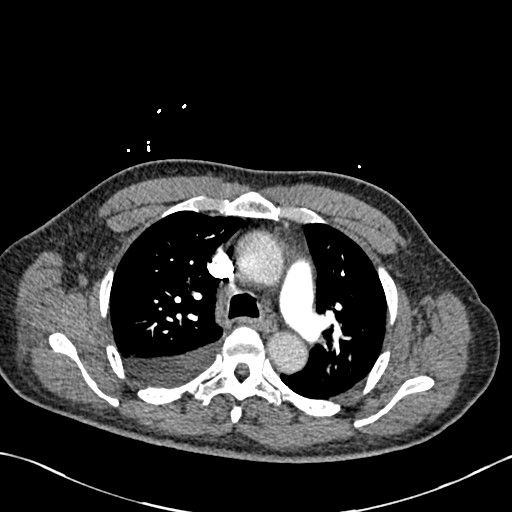
[im 229/293  lung]
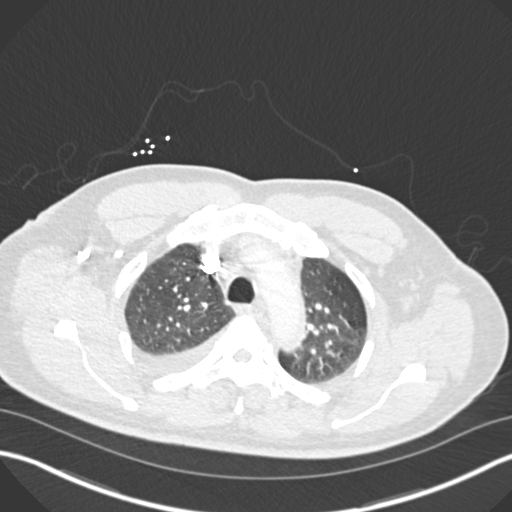
[im 242/293  soft-tissue]
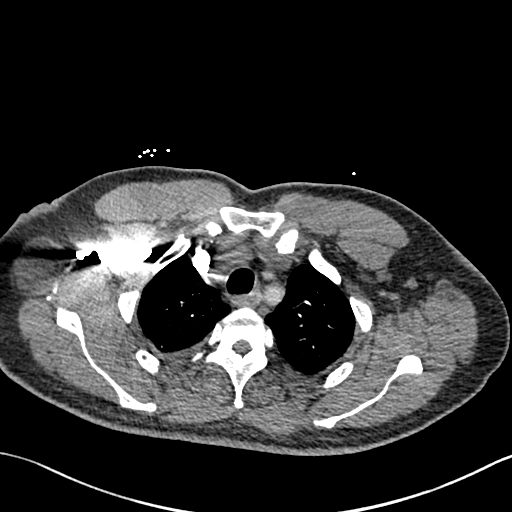
[im 254/293  lung]
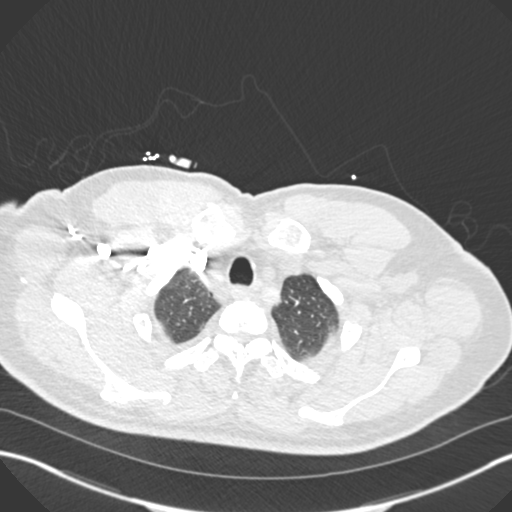
[im 280/293  soft-tissue]
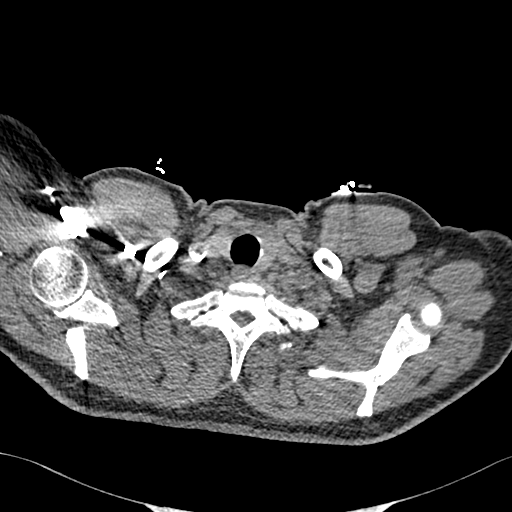

[Series 7: coronal mpr · coronal · 0.57mm/px · 2 of 87 slices shown]
[im 29/87  soft-tissue]
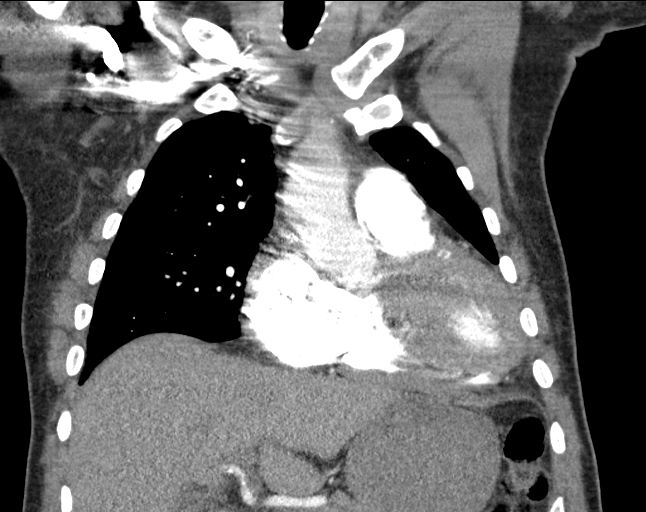
[im 58/87  soft-tissue]
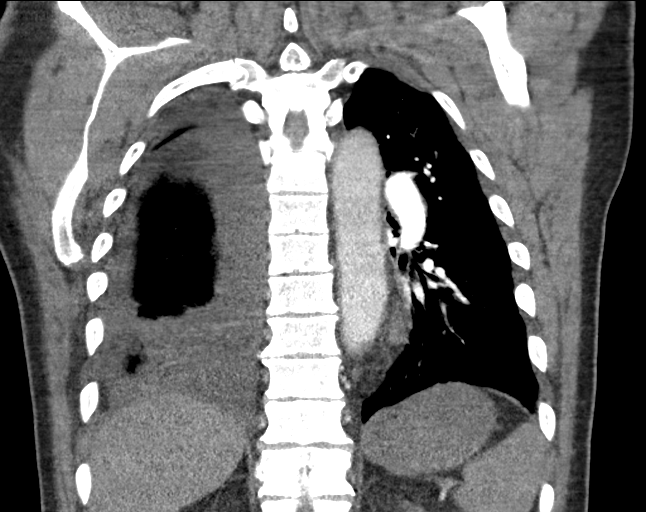

[18 of 46 positions shown; findings below may reference images not displayed]

FINDINGS: Cardiovascular: No filling defects within the pulmonary arteries to
suggest acute pulmonary embolism. No acute findings of the aorta or
great vessels. No pericardial fluid. LEFT ventricle myocardium
appears thickened.

Mediastinum/Nodes: No axillary supraclavicular adenopathy. No
mediastinal adenopathy. No pericardial effusion. Esophagus normal.

Lungs/Pleura: Moderate RIGHT-sided pleural effusion. No pulmonary
edema. Minimal RIGHT basilar atelectasis. No suspicious pulmonary
nodularity.

Upper Abdomen: Limited view of the liver, kidneys, pancreas are
unremarkable. Normal adrenal glands.

Musculoskeletal: No aggressive osseous lesion.

Review of the MIP images confirms the above findings.
IMPRESSION: 1. No evidence acute pulmonary embolism.
2. Moderate size layering RIGHT pleural effusion of unclear
etiology. No evidence of malignancy.
3. Mild RIGHT basilar atelectasis.  No evidence of pneumonia.
4. LEFT ventricle myocardium appears thickened. Probable ventricular
hypertrophy.

## 2021-10-12 IMAGING — DX DG CHEST 1V
1 series · 1 of 1 positions shown · non-contrast
Comparison: CT 08/08/2019. Radiographs 08/07/2019 and 08/03/2019.

CLINICAL DATA: Post thoracentesis on the right.

EXAM:
CHEST  1 VIEW

[chest ap]
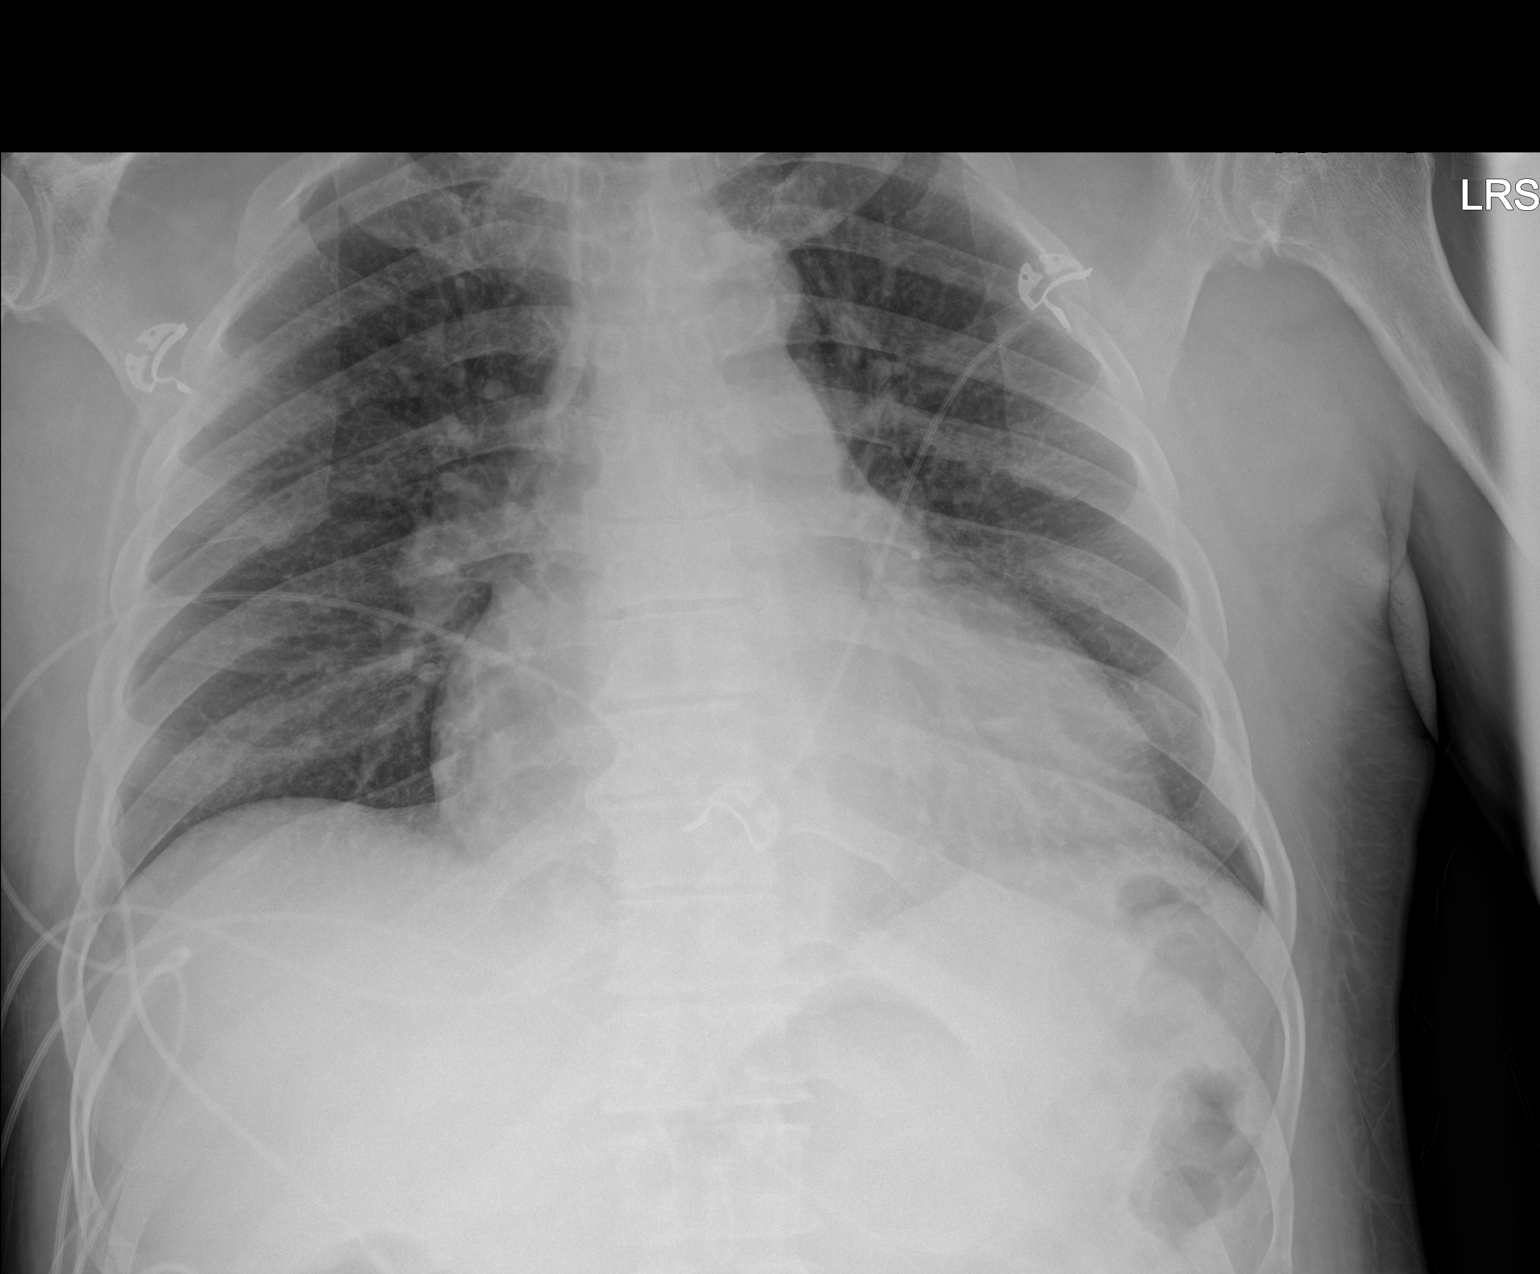

[1 of 1 positions shown; findings below may reference images not displayed]

FINDINGS: 0776 hours. There is no evidence of pneumothorax following
thoracentesis. The right pleural effusion appears slightly smaller.
Moderate cardiac enlargement is stable. There is aortic
atherosclerosis. The lungs are clear.
IMPRESSION: No evidence of pneumothorax following right thoracentesis. The right
pleural effusion appears slightly smaller than on the earlier study.

## 2021-10-17 ENCOUNTER — Other Ambulatory Visit (HOSPITAL_COMMUNITY): Payer: Self-pay

## 2021-10-17 NOTE — Progress Notes (Signed)
Had a home visit with Kevin [name].  He was getting his mother up and feeding her.  He is very busy taking care of his mother that is bed bound now.  He states he has everything he needs, all his medications.  He states has enough for 1 more month.  He will get them next month. He states he has been doing good.  He denies having any problems such as chest pain, headaches, dizziness or increased shortness of breathing.  He states his weight is same.  He states has plenty of food.  Will continue to visit for heart failure, diet and medication compliance.   [place] [phone]

## 2021-10-19 ENCOUNTER — Ambulatory Visit: Payer: Medicaid Other | Admitting: Family

## 2021-10-19 ENCOUNTER — Telehealth: Payer: Self-pay | Admitting: Family

## 2021-10-19 NOTE — Progress Notes (Unsigned)
Patient ID: Kevin [name], male    DOB: 08/02/1956, 67 y.o.   MRN: [id number]  [name] is a 66 y/o male with a history of HTN, COPD, tobacco use and chronic heart failure.   Echo report from 01/13/20 reviewed and showed an EF of 50-55% along with moderate LVH. Echo report from 08/04/2019 reviewed and showed an EF of 25-30% along with mild/moderate TR.   Has not been admitted or been in the ED in the last 6 months.   He presents today for a follow-up visit with a chief complaint of   Past Medical History:  Diagnosis Date   Chronic combined systolic (congestive) and diastolic (congestive) heart failure ([place])    a. 2019 reported neg stress test and nl cors on cath in [facility]; b. 07/2019 Echo: EF 25-30%, Gr2 DD, glob HK. Mod reduced RV fxn w/ volume overload. Mod dil RA. Mild to mod TR. Mod elev PASP.   COPD (chronic obstructive pulmonary disease) (HCC)    Diabetes mellitus without complication (HCC)    Hypertension    Noncompliance    Pleural effusion on right    a. 07/2019 Thoracentesis: 300 ml.    Social History   Tobacco Use   Smoking status: Some Days    Packs/day: 0.25    Years: 20.00    Pack years: 5.00    Types: Cigarettes   Smokeless tobacco: Never   Tobacco comments:    a pack lasts a week (appx. 4 days)  Substance Use Topics   Alcohol use: Never   No Known Allergies    Review of Systems  Constitutional:  Negative for appetite change and fatigue.  HENT:  Negative for congestion, postnasal drip and sore throat.   Eyes: Negative.   Respiratory:  Negative for cough, chest tightness and shortness of breath.   Cardiovascular:  Negative for chest pain, palpitations and leg swelling.  Gastrointestinal:  Negative for abdominal distention and abdominal pain.  Endocrine: Negative.   Genitourinary: Negative.   Musculoskeletal:  Negative for back pain, myalgias and neck pain.  Skin: Negative.   Allergic/Immunologic: Negative.   Neurological:  Negative for dizziness and  light-headedness.  Hematological:  Negative for adenopathy. Does not bruise/bleed easily.  Psychiatric/Behavioral:  Negative for dysphoric mood and sleep disturbance (sleeping on 1 pillows). The patient is not nervous/anxious.       Physical Exam Vitals and nursing note reviewed.  Constitutional:      General: He is not in acute distress.    Appearance: Normal appearance.  HENT:     Head: Normocephalic and atraumatic.  Neck:     Vascular: No carotid bruit.  Cardiovascular:     Rate and Rhythm: Normal rate and regular rhythm.  Pulmonary:     Effort: Pulmonary effort is normal. No respiratory distress.     Breath sounds: No wheezing or rales.  Abdominal:     General: Abdomen is flat. There is no distension.     Palpations: Abdomen is soft.  Musculoskeletal:        General: No tenderness.     Cervical back: Normal range of motion and neck supple.     Right lower leg: No tenderness. No edema.     Left lower leg: No tenderness. No edema.  Skin:    General: Skin is warm and dry.  Neurological:     General: No focal deficit present.     Mental Status: He is alert and oriented to person, place, and time.  Psychiatric:        Mood and Affect: Mood normal.        Behavior: Behavior normal.   Assessment & Plan:  1: Chronic heart failure with preserved ejection fraction along with structural changes- - NYHA class I - euvolemic today - weighing daily; reminded to call for an overnight weight gain of >2 pounds or a weekly weight gain of >5 pounds - weight 186.4 from last visit here 1 month ago - not adding salt and has been trying to read food labels; reviewed the importance of closely following a 2000mg  sodium diet  - saw cardiology (Agbor-Etang) 01/14/20 - on GDMT of carvedilol, entresto and spironolactone - participating in paramedicine program - BNP 08/03/2019 was 2085.0  2: HTN- - BP  - saw PCP at [facility] 08/30/20 - BMP on 10/04/20 reviewed and showed sodium 141,  potassium 3.8, creatinine 0.98 and GFR 94  3: COPD-  - using nebulizer and inhalers - smoking 2 cigarettes daily   He did not bring his medications or list. Verbally reviewed each. He typically takes as prescribed but due to his mothers hospitalization and transportation needs, he has not been able to take them.

## 2021-10-19 NOTE — Telephone Encounter (Signed)
Patient did not show for his Heart Failure Clinic appointment on 10/19/21. Will attempt to reschedule.

## 2021-11-15 ENCOUNTER — Other Ambulatory Visit (HOSPITAL_COMMUNITY): Payer: Self-pay

## 2021-11-15 NOTE — Progress Notes (Signed)
Today had a home visit with Josph Macho.  He states been doing good.  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  I rescheduled his heart failure appt and he is aware of when.  He rides to bus over to it.  He has everything for daily living.  He states his scooter batteries are dead and will not charge no more.  He states has plenty of food but they are being cut, he is not sure how much.  Advised he needs to go to social security and check on disability, he has never heard anything.  He denies chest pain, headaches, dizziness or increased shortness of breath.  He has all his medications and aware of how to take them.  He is aware of when he needs them to go to medication management.  Met outside, he states his Mom has company.  Will continue to visit for heart failure, diet and medication management.  ? ?Renada Cronin ?[place] EMT-Paramedic ?[phone] ?

## 2021-11-27 ENCOUNTER — Ambulatory Visit: Payer: Medicaid Other | Admitting: Family

## 2021-11-27 ENCOUNTER — Telehealth: Payer: Self-pay | Admitting: Family

## 2021-11-27 NOTE — Progress Notes (Deleted)
? Patient ID: Kevin [name], male    DOB: [birth year], 66 y.o.   MRN: [id number] ? ?Kevin [name] is a 66 y/o male with a history of HTN, COPD, tobacco use and chronic heart failure.  ? ?Echo report from 01/13/20 reviewed and showed an EF of 50-55% along with moderate LVH. Echo report from 08/04/2019 reviewed and showed an EF of 25-30% along with mild/moderate TR.  ? ?Has not been admitted or been in the ED in the last 6 months.  ? ?He presents today for a follow-up visit with a chief complaint of  ? ?Past Medical History:  ?Diagnosis Date  ? Chronic combined systolic (congestive) and diastolic (congestive) heart failure (HCC)   ? a. 2019 reported neg stress test and nl cors on cath in [facility]; b. 07/2019 Echo: EF 25-30%, Gr2 DD, glob HK. Mod reduced RV fxn w/ volume overload. Mod dil RA. Mild to mod TR. Mod elev PASP.  ? COPD (chronic obstructive pulmonary disease) (HCC)   ? Diabetes mellitus without complication (HCC)   ? Hypertension   ? Noncompliance   ? Pleural effusion on right   ? a. 07/2019 Thoracentesis: 300 ml.  ? ? ?Social History  ? ?Tobacco Use  ? Smoking status: Some Days  ?  Packs/day: 0.25  ?  Years: 20.00  ?  Pack years: 5.00  ?  Types: Cigarettes  ? Smokeless tobacco: Never  ? Tobacco comments:  ?  a pack lasts a week (appx. 4 days)  ?Substance Use Topics  ? Alcohol use: Never  ? ?No Known Allergies ? ? ? ?Review of Systems  ?Constitutional:  Negative for appetite change and fatigue.  ?HENT:  Negative for congestion, postnasal drip and sore throat.   ?Eyes: Negative.   ?Respiratory:  Negative for cough, chest tightness and shortness of breath.   ?Cardiovascular:  Negative for chest pain, palpitations and leg swelling.  ?Gastrointestinal:  Negative for abdominal distention and abdominal pain.  ?Endocrine: Negative.   ?Genitourinary: Negative.   ?Musculoskeletal:  Positive for myalgias (right thigh). Negative for back pain and neck pain.  ?Skin: Negative.   ?Allergic/Immunologic: Negative.   ?Neurological:   Negative for dizziness and light-headedness.  ?Hematological:  Negative for adenopathy. Does not bruise/bleed easily.  ?Psychiatric/Behavioral:  Negative for dysphoric mood and sleep disturbance (sleeping on 1 pillows). The patient is not nervous/anxious.   ? ? ? ? ?Physical Exam ?Vitals and nursing note reviewed.  ?Constitutional:   ?   Appearance: Normal appearance.  ?HENT:  ?   Head: Normocephalic and atraumatic.  ?Neck:  ?   Vascular: No carotid bruit.  ?Cardiovascular:  ?   Rate and Rhythm: Normal rate and regular rhythm.  ?Pulmonary:  ?   Effort: Pulmonary effort is normal. No respiratory distress.  ?   Breath sounds: No wheezing or rales.  ?Abdominal:  ?   General: Abdomen is flat. There is no distension.  ?   Palpations: Abdomen is soft.  ?Musculoskeletal:     ?   General: No tenderness.  ?   Cervical back: Normal range of motion and neck supple.  ?   Right lower leg: No tenderness. No edema.  ?   Left lower leg: No tenderness. No edema.  ?Skin: ?   General: Skin is warm and dry.  ?Neurological:  ?   General: No focal deficit present.  ?   Mental Status: He is alert and oriented to person, place, and time.  ?Psychiatric:     ?  Mood and Affect: Mood normal.     ?   Behavior: Behavior normal.  ? ?Assessment & Plan: ? ?1: Chronic heart failure with preserved ejection fraction along with structural changes- ?- NYHA class I ?- euvolemic today ?- weighing daily; reminded to call for an overnight weight gain of >2 pounds or a weekly weight gain of >5 pounds ?- weight 186.4 pounds from last visit here 2.5 months ago ?- not adding salt and has been trying to read food labels; reviewed the importance of closely following a 2000mg  sodium diet  ?- saw cardiology (Agbor-Etang) 01/14/20 ?- on GDMT of carvedilol, entresto and spironolactone ?- participating in paramedicine program ?- BNP 08/03/2019 was 2085.0 ? ?2: HTN- ?- BP  ?- saw PCP at [facility] 08/30/20 ?- BMP on 10/04/20 reviewed and showed sodium 141, potassium  3.8, creatinine 0.98 and GFR 94 ? ?3: COPD-  ?- using nebulizer and inhalers ?- smoking 2 cigarettes daily ? ? ?Medication bottles reviewed.  ? ? ? ? ?

## 2021-11-27 NOTE — Telephone Encounter (Signed)
Patient did not show for his Heart Failure Clinic appointment on 11/27/21. This is the 3rd appointment he has missed.  ?Will attempt to reschedule.   ?

## 2021-12-11 ENCOUNTER — Encounter (HOSPITAL_COMMUNITY): Payer: Self-pay

## 2021-12-11 ENCOUNTER — Other Ambulatory Visit (HOSPITAL_COMMUNITY): Payer: Self-pay

## 2021-12-11 NOTE — Progress Notes (Signed)
Caught Kevin [name] outside visiting with some friends.  We discussed him missing his [name] appt and he states he just can not leave his mother unattended.  He states his sister will be here Friday and he could go. Contacted [name] and they made him appt for then.  Attempted to set up [facility] for him but had no room.  He will catch public bus over to hospital.  He will also go to medication management by courtesy car while there to pick up meds.  He is running low on them.  He states been doing well.  He cares for his mother daily, bathes, feeds cooks, etc.  She is starting to be able to sit in a chair now.  I made him aware that he has to take care of his self to be able to care for her.  He states his back has been hurting moving her around, he wants a back brace.  He ordered one from [place].  He denies any weight gain, chest pain, headaches, dizziness on increased shortness of breath.  He has all his medications and he refills his med boxes.  He has everything for daily living.  He checks his BP at home with his machine.  He tries to weigh daily.  He has some edema in lower extremities.  He did not tell me what he weighed.  Discussed he needs to go to Social services and check on medicaid and social security.  He staes he will go there.  Will continue to visit for heart failure, diet and medication management.  ? ?Kevin [name] ?[place] EMT-Paramedic ?[phone] ?

## 2021-12-14 ENCOUNTER — Ambulatory Visit: Payer: Medicaid Other | Admitting: Family

## 2021-12-14 ENCOUNTER — Telehealth: Payer: Self-pay | Admitting: Family

## 2021-12-14 NOTE — Telephone Encounter (Signed)
Patient did not show for his Heart Failure Clinic appointment on 12/14/21. Will attempt to reschedule.   ?

## 2021-12-28 ENCOUNTER — Other Ambulatory Visit: Payer: Self-pay

## 2022-01-03 ENCOUNTER — Telehealth (HOSPITAL_COMMUNITY): Payer: Self-pay

## 2022-01-03 NOTE — Telephone Encounter (Signed)
Reached out to [place] to remind him of his [name].  He states he will be going.  He never went to pick up his medications, he has been out of 2 for a few days.  He states will go tomorrow after his appt.   ? ?Firmin Belisle ?[place] EMT-Paramedic ?[phone] ?

## 2022-01-04 ENCOUNTER — Other Ambulatory Visit: Payer: Self-pay

## 2022-01-04 ENCOUNTER — Ambulatory Visit: Payer: Medicare Other | Attending: Family | Admitting: Family

## 2022-01-04 ENCOUNTER — Encounter: Payer: Self-pay | Admitting: Family

## 2022-01-04 VITALS — BP 176/104 | HR 67 | Resp 18 | Ht 67.0 in | Wt 181.0 lb

## 2022-01-04 DIAGNOSIS — I10 Essential (primary) hypertension: Secondary | ICD-10-CM

## 2022-01-04 DIAGNOSIS — F17210 Nicotine dependence, cigarettes, uncomplicated: Secondary | ICD-10-CM | POA: Insufficient documentation

## 2022-01-04 DIAGNOSIS — J449 Chronic obstructive pulmonary disease, unspecified: Secondary | ICD-10-CM | POA: Diagnosis not present

## 2022-01-04 DIAGNOSIS — I110 Hypertensive heart disease with heart failure: Secondary | ICD-10-CM | POA: Diagnosis not present

## 2022-01-04 DIAGNOSIS — I5032 Chronic diastolic (congestive) heart failure: Secondary | ICD-10-CM | POA: Insufficient documentation

## 2022-01-04 MED ORDER — AMLODIPINE BESYLATE 10 MG PO TABS
ORAL_TABLET | Freq: Every day | ORAL | 3 refills | Status: DC
Start: 2022-01-04 — End: 2022-05-01
  Filled 2022-01-04: qty 90, fill #0
  Filled 2022-01-18: qty 90, 90d supply, fill #0

## 2022-01-04 NOTE — Patient Instructions (Signed)
Continue weighing daily and call for an overnight weight gain of 3 pounds or more or a weekly weight gain of more than 5 pounds.   If you have voicemail, please make sure your mailbox is cleaned out so that we may leave a message and please make sure to listen to any voicemails.     

## 2022-01-04 NOTE — Progress Notes (Signed)
? Patient ID: Kevin [name], male    DOB: Aug 27, 1955, 66 y.o.   MRN: [id number] ? ?Kevin [name] is a 66 y/o male with a history of HTN, COPD, tobacco use and chronic heart failure.  ? ?Echo report from 01/13/20 reviewed and showed an EF of 50-55% along with moderate LVH. Echo report from 08/04/2019 reviewed and showed an EF of 25-30% along with mild/moderate TR.  ? ?Has not been admitted or been in the ED in the last 6 months.  ? ?He presents today with a chief complaint of a follow-up visit. He has no other symptoms and specifically denies any dizziness, difficulty sleeping, abdominal distention, palpitations, pedal edema, chest pain, shortness of breath, cough, fatigue or weight gain.  ? ?Has been out of a few of his medications and taxi that brought him today is unable to stop at medication management clinic to pick them up. He is hoping to have a ride on Monday (today is Friday) ? ?Past Medical History:  ?Diagnosis Date  ? Chronic combined systolic (congestive) and diastolic (congestive) heart failure (HCC)   ? a. 2019 reported neg stress test and nl cors on cath in [facility]; b. 07/2019 Echo: EF 25-30%, Gr2 DD, glob HK. Mod reduced RV fxn w/ volume overload. Mod dil RA. Mild to mod TR. Mod elev PASP.  ? COPD (chronic obstructive pulmonary disease) ([place])   ? Diabetes mellitus without complication ([place])   ? Hypertension   ? Noncompliance   ? Pleural effusion on right   ? a. 07/2019 Thoracentesis: 300 ml.  ? ? ?Social History  ? ?Tobacco Use  ? Smoking status: Some Days  ?  Packs/day: 0.25  ?  Years: 20.00  ?  Pack years: 5.00  ?  Types: Cigarettes  ? Smokeless tobacco: Never  ? Tobacco comments:  ?  a pack lasts a week (appx. 4 days)  ?Substance Use Topics  ? Alcohol use: Never  ? ?No Known Allergies ? ?Prior to Admission medications   ?Medication Sig Start Date End Date Taking? Authorizing Provider  ?furosemide (LASIX) 40 MG tablet Take 0.5 tablets (20 mg total) by mouth daily. ?Patient taking differently: Take 40 mg by  mouth daily. 06/12/20  Yes Patrecia Pour, MD  ?sacubitril-valsartan (ENTRESTO) 97-103 MG Take 1 tablet by mouth 2 (two) times daily.   Yes [provider]  ?albuterol (VENTOLIN HFA) 108 (90 Base) MCG/ACT inhaler Inhale 2 puffs into the lungs every 6 (six) hours as needed for wheezing or shortness of breath. ?Patient not taking: Reported on 01/04/2022 09/08/19   Alisa Graff, FNP  ?amLODipine (NORVASC) 10 MG tablet TAKE ONE TABLET BY MOUTH EVERY DAY 01/04/22 01/04/23  Alisa Graff, FNP  ?Blood Pressure KIT 1 kit by Does not apply route daily. ?Patient not taking: Reported on 01/04/2022 12/23/19   Iloabachie, Chioma E, NP  ?carvedilol (COREG) 25 MG tablet TAKE ONE TABLET BY MOUTH 2 TIMES A DAY ?Patient not taking: Reported on 01/04/2022 04/27/20   Alisa Graff, FNP  ?gabapentin (NEURONTIN) 300 MG capsule Take 300 mg by mouth 2 (two) times daily. ?Patient not taking: Reported on 01/04/2022    [provider]  ?pravastatin (PRAVACHOL) 20 MG tablet TAKE ONE TABLET BY MOUTH EVERY DAY ?Patient not taking: Reported on 01/04/2022 06/13/21 06/13/22  Alisa Graff, FNP  ?spironolactone (ALDACTONE) 25 MG tablet TAKE ONE TABLET BY MOUTH EVERY DAY ?Patient not taking: Reported on 01/04/2022 04/27/20   Alisa Graff, FNP  ?tiotropium (SPIRIVA)  18 MCG inhalation capsule Place 1 capsule (18 mcg total) into inhaler and inhale daily. ?Patient not taking: Reported on 01/04/2022 12/23/19   Langston Reusing, NP  ? ?Review of Systems  ?Constitutional:  Negative for appetite change and fatigue.  ?HENT:  Negative for congestion, postnasal drip and sore throat.   ?Eyes: Negative.   ?Respiratory:  Negative for cough, chest tightness and shortness of breath.   ?Cardiovascular:  Negative for chest pain, palpitations and leg swelling.  ?Gastrointestinal:  Negative for abdominal distention and abdominal pain.  ?Endocrine: Negative.   ?Genitourinary: Negative.   ?Musculoskeletal:  Negative for back pain, myalgias and neck pain.   ?Skin: Negative.   ?Allergic/Immunologic: Negative.   ?Neurological:  Negative for dizziness and light-headedness.  ?Hematological:  Negative for adenopathy. Does not bruise/bleed easily.  ?Psychiatric/Behavioral:  Negative for dysphoric mood and sleep disturbance (sleeping on 1 pillows). The patient is not nervous/anxious.   ? ?Vitals:  ? 01/04/22 1220  ?BP: (!) 176/104  ?Pulse: 67  ?Resp: 18  ?SpO2: 100%  ?Weight: 181 lb (82.1 kg)  ?Height: '5\' 7"'  (1.702 m)  ? ?Wt Readings from Last 3 Encounters:  ?01/04/22 181 lb (82.1 kg)  ?09/18/21 186 lb 4 oz (84.5 kg)  ?07/23/21 182 lb (82.6 kg)  ? ?Lab Results  ?Component Value Date  ? CREATININE 0.98 10/04/2020  ? CREATININE 0.98 07/01/2020  ? CREATININE 1.46 (H) 06/12/2020  ? ?Physical Exam ?Vitals and nursing note reviewed.  ?Constitutional:   ?   General: He is not in acute distress. ?   Appearance: Normal appearance.  ?HENT:  ?   Head: Normocephalic and atraumatic.  ?Neck:  ?   Vascular: No carotid bruit.  ?Cardiovascular:  ?   Rate and Rhythm: Normal rate and regular rhythm.  ?Pulmonary:  ?   Effort: Pulmonary effort is normal. No respiratory distress.  ?   Breath sounds: No wheezing or rales.  ?Abdominal:  ?   General: Abdomen is flat. There is no distension.  ?   Palpations: Abdomen is soft.  ?Musculoskeletal:     ?   General: No tenderness.  ?   Cervical back: Normal range of motion and neck supple.  ?   Right lower leg: No tenderness. No edema.  ?   Left lower leg: No tenderness. No edema.  ?Skin: ?   General: Skin is warm and dry.  ?Neurological:  ?   General: No focal deficit present.  ?   Mental Status: He is alert and oriented to person, place, and time.  ?Psychiatric:     ?   Mood and Affect: Mood normal.     ?   Behavior: Behavior normal.  ? ?Assessment & Plan: ? ?1: Chronic heart failure with preserved ejection fraction along with structural changes- ?- NYHA class I ?- euvolemic today ?- weighing daily; reminded to call for an overnight weight gain of >2  pounds or a weekly weight gain of >5 pounds ?- weight down 5 pounds from last visit here 3 months ago ?- not adding salt and has been trying to read food labels; reviewed the importance of closely following a 2010m sodium diet  ?- saw cardiology ([facility] 01/14/20 ?- has been out of carvedilol, lasix & spironolactone and taxi that brought him today can not take him to pharmacy; he is hoping to get a ride on Monday ?- BNP 08/03/2019 was 2085.0 ? ?2: HTN- ?- BP elevated (176/104) as he's been out of above medications ?- saw PCP  at [facility] 08/30/20 ?- BMP on 10/04/20 reviewed and showed sodium 141, potassium 3.8, creatinine 0.98 and GFR 94 ? ?3: COPD-  ?- using nebulizer and inhalers ?- smoking 2 cigarettes daily ? ? ?Medication bottles reviewed.  ? ?Return in 2 months, sooner if needed.  ? ? ?

## 2022-01-08 ENCOUNTER — Other Ambulatory Visit (HOSPITAL_COMMUNITY): Payer: Self-pay

## 2022-01-08 ENCOUNTER — Encounter (HOSPITAL_COMMUNITY): Payer: Self-pay

## 2022-01-09 NOTE — Progress Notes (Signed)
Today went by Kevin [name]'s home to see if he had picked up his prescriptions.  He still has not picked them up.  He has been out of some of his medications for over a week now.  He states will go this evening when get a ride.  Explained importance of taking his medications regularly.  His blood pressure is running higher.  He denies any problems.  He takes care of his mother and afraid of leaving her too long.  Explained to him if he does not take care of his self he will not be able to take care of her.  He will be turning 66 in a few weeks.  Explained he needs to go file for social security and medicare.  He states will go to social services soon.  Will continue to visit for heart failure, diet and medication management.  ? ?Kevin [name] ?[place] EMT-Paramedic ?[phone] ?

## 2022-01-18 ENCOUNTER — Other Ambulatory Visit: Payer: Self-pay

## 2022-01-23 ENCOUNTER — Other Ambulatory Visit: Payer: Self-pay

## 2022-01-28 ENCOUNTER — Telehealth: Payer: Self-pay | Admitting: Pharmacy Technician

## 2022-01-28 NOTE — Telephone Encounter (Signed)
Patient only signed [facility] Attestation.  Would need to provide current year's household income if PAP medications were needed.   Patient states that he is going to call SS Administration on 6/8 to schedule a phone appointment for signing-up for Medicare A, B & D.  Sherilyn Dacosta Patient Advocate Specialist [facility] at [facility]

## 2022-02-05 ENCOUNTER — Other Ambulatory Visit (HOSPITAL_COMMUNITY): Payer: Self-pay

## 2022-02-05 ENCOUNTER — Encounter (HOSPITAL_COMMUNITY): Payer: Self-pay

## 2022-02-05 NOTE — Progress Notes (Signed)
Today had a home visit with Kevin [name].  He showed me a letter that he got at the medication management.  It tells him how to sign up for medicare part D.  I keep advising him he needs to go to social security and social services to sign up. Medicaid was never approved, he is not receiving social security and now since eligible for medicare he can not get his meds at Medication management.  He gets no income.  He gets EBT for food only.

## 2022-02-11 ENCOUNTER — Other Ambulatory Visit: Payer: Self-pay

## 2022-02-21 ENCOUNTER — Encounter: Payer: Self-pay | Admitting: Licensed Clinical Social Worker

## 2022-02-21 NOTE — Progress Notes (Signed)
Heart and Vascular Care Navigation  02/21/2022  Kevin [name] [birth year] [id number]  Reason for Referral: Patient seen in [name] to assist with financial needs.   Engaged with patient face to face for initial visit for Heart and Vascular Care Coordination.                                                                                                   Assessment:  Patient is a 66 yo male who resides with his elderly mother. He states that he lived in [place] and when coivd started he moved back home with his mother. Patient states he has not received his "money" since living in [place].  Patient shared a Medicare A&B card as well as an income verification statement from [facility] showing he receives SSI benefits. Patient was adamant that he does not have a card nor a check received monthly.                                  HRT/VAS Care Coordination     Patients Home Cardiology Office [place] [facility]   Outpatient Care Team Social Worker   Community Paramedic Name: [name]- [phone]   Social Worker Name: Lasandra Beech, [place] [phone]   Living arrangements for the past 2 months Apartment   Lives with: Parents  Mother   Patient Current Insurance Coverage Traditional Medicare   Patient Has Concern With Paying Medical Bills No   Does Patient Have Prescription Coverage? Yes   Home Assistive Devices/Equipment Other (Comment)  four wheeled walker   HH Agency [name]   Current home services DME       Social History:                                                                             SDOH Screenings   Alcohol Screen: Not on file  Depression (PHQ2-9): Low Risk  (09/18/2021)   Depression (PHQ2-9)    PHQ-2 Score: 0  Financial Resource Strain: Medium Risk (01/20/2020)   Overall Financial Resource Strain (CARDIA)    Difficulty of Paying Living Expenses: Somewhat hard  Food Insecurity: Food Insecurity Present (01/20/2020)   Hunger Vital  Sign    Worried About Running Out of Food in the Last Year: Sometimes true    Ran Out of Food in the Last Year: Sometimes true  Housing: Low Risk  (02/21/2022)   Housing    Last Housing Risk Score: 0  Physical Activity: Insufficiently Active (01/20/2020)   Exercise Vital Sign    Days of Exercise per Week: 2 days    Minutes of Exercise per Session: 30 min  Social Connections: Moderately Isolated (08/04/2019)   Social Connection and Isolation Panel [NHANES]  Frequency of Communication with Friends and Family: Three times a week    Frequency of Social Gatherings with Friends and Family: Never    Attends Religious Services: Never    Database administrator or Organizations: No    Attends Banker Meetings: Never    Marital Status: Never married  Stress: Not on file  Tobacco Use: High Risk (02/05/2022)   Patient History    Smoking Tobacco Use: Some Days    Smokeless Tobacco Use: Never    Passive Exposure: Not on file  Transportation Needs: Unmet Transportation Needs (02/21/2022)   PRAPARE - Administrator, Civil Service (Medical): Yes    Lack of Transportation (Non-Medical): Yes    SDOH Interventions: Financial Resources:    [employer] and confirmed benefits going to [employer] card  Food Insecurity:     Housing Insecurity:  Housing Interventions: Intervention Not Indicated  Transportation:   Transportation Interventions: Taxi Voucher Given   Follow-up plan:  CSW , Scientist, research (medical) and patient contacted [employer] and received confirmation of benefits and told to contact [employer] to inquire about card. [employer] confirmed that a debit card had been mailed to patient's home on September 04, 2021 and there is currently a balance of $14.72. Patient receives $859.00 monthly from SSI. After multiple calls, patient realized that the "card" he had been using is the [employer] card with his SSI benefits. Patient had already requested a new card be mailed to  his home and old card cancelled. Patient  told to go to Social Services to apply for medicaid and bring all documents. Patient verbalizes understanding and will follow up. Community Paramedic will follow up and return call to CSW if needed. Lasandra Beech, LCSW, CCSW-MCS [phone]

## 2022-03-05 NOTE — Progress Notes (Deleted)
Patient ID: Kevin [name], male    DOB: 30-Sep-1955, 66 y.o.   MRN: [id number]  Kevin [name] is a 66 y/o male with a history of HTN, COPD, tobacco use and chronic heart failure.   Echo report from 01/13/20 reviewed and showed an EF of 50-55% along with moderate LVH. Echo report from 08/04/2019 reviewed and showed an EF of 25-30% along with mild/moderate TR.   Has not been admitted or been in the ED in the last 6 months.   He presents today with a chief complaint of a follow-up visit.   Past Medical History:  Diagnosis Date   Chronic combined systolic (congestive) and diastolic (congestive) heart failure (HCC)    a. 2019 reported neg stress test and nl cors on cath in [facility]; b. 07/2019 Echo: EF 25-30%, Gr2 DD, glob HK. Mod reduced RV fxn w/ volume overload. Mod dil RA. Mild to mod TR. Mod elev PASP.   COPD (chronic obstructive pulmonary disease) (HCC)    Diabetes mellitus without complication (HCC)    Hypertension    Noncompliance    Pleural effusion on right    a. 07/2019 Thoracentesis: 300 ml.    Social History   Tobacco Use   Smoking status: Some Days    Packs/day: 0.25    Years: 20.00    Total pack years: 5.00    Types: Cigarettes   Smokeless tobacco: Never   Tobacco comments:    a pack lasts a week (appx. 4 days)  Substance Use Topics   Alcohol use: Never   No Known Allergies   Review of Systems  Constitutional:  Negative for appetite change and fatigue.  HENT:  Negative for congestion, postnasal drip and sore throat.   Eyes: Negative.   Respiratory:  Negative for cough, chest tightness and shortness of breath.   Cardiovascular:  Negative for chest pain, palpitations and leg swelling.  Gastrointestinal:  Negative for abdominal distention and abdominal pain.  Endocrine: Negative.   Genitourinary: Negative.   Musculoskeletal:  Negative for back pain, myalgias and neck pain.  Skin: Negative.   Allergic/Immunologic: Negative.   Neurological:  Negative for dizziness and  light-headedness.  Hematological:  Negative for adenopathy. Does not bruise/bleed easily.  Psychiatric/Behavioral:  Negative for dysphoric mood and sleep disturbance (sleeping on 1 pillows). The patient is not nervous/anxious.       Physical Exam Vitals and nursing note reviewed.  Constitutional:      General: He is not in acute distress.    Appearance: Normal appearance.  HENT:     Head: Normocephalic and atraumatic.  Neck:     Vascular: No carotid bruit.  Cardiovascular:     Rate and Rhythm: Normal rate and regular rhythm.  Pulmonary:     Effort: Pulmonary effort is normal. No respiratory distress.     Breath sounds: No wheezing or rales.  Abdominal:     General: Abdomen is flat. There is no distension.     Palpations: Abdomen is soft.  Musculoskeletal:        General: No tenderness.     Cervical back: Normal range of motion and neck supple.     Right lower leg: No tenderness. No edema.     Left lower leg: No tenderness. No edema.  Skin:    General: Skin is warm and dry.  Neurological:     General: No focal deficit present.     Mental Status: He is alert and oriented to person, place, and time.  Psychiatric:  Mood and Affect: Mood normal.        Behavior: Behavior normal.    Assessment & Plan:  1: Chronic heart failure with preserved ejection fraction along with structural changes- - NYHA class I - euvolemic today - weighing daily; reminded to call for an overnight weight gain of >2 pounds or a weekly weight gain of >5 pounds - weight 181 pounds from last visit here 2 months ago - not adding salt and has been trying to read food labels; reviewed the importance of closely following a 2000mg  sodium diet  - saw cardiology ([facility]) 01/14/20 - BNP 08/03/2019 was 2085.0  2: HTN- - BP  - saw PCP at [facility] 08/30/20 - BMP on 10/04/20 reviewed and showed sodium 141, potassium 3.8, creatinine 0.98 and GFR 94  3: COPD-  - using nebulizer and inhalers -  smoking 2 cigarettes daily   Medication bottles reviewed.

## 2022-03-06 ENCOUNTER — Ambulatory Visit: Payer: Medicare Other | Admitting: Family

## 2022-03-25 ENCOUNTER — Other Ambulatory Visit (HOSPITAL_COMMUNITY): Payer: Self-pay

## 2022-03-25 ENCOUNTER — Ambulatory Visit: Payer: Medicare Other | Admitting: Family

## 2022-03-25 NOTE — Progress Notes (Signed)
Was meeting Kevin [name] at his [name] appt but did not show.  Unusual for him and he will not answer his phone.  Went by his home and he was there.  He advised got home this morning, was out of town for a family reunion.  He states been doing ok, advised him importance of letting me know when he can not make his appt, [name] had to pay for the cab they got for him even though he did not ride.  He appeared to understand. He has not received a letter of help for his medications through social security yet.  Advised him he needs to find a pharmacy, he advised who ever is cheaper.  He has only medicare and no prescription coverage right now.  Will search around for one that delivers also.  He denies any problems.  Rescheduled his [name] appt.  Will continue to visit for heart failure, diet and medication management.   Kevin [name] EMT-Paramedic [phone]

## 2022-04-23 ENCOUNTER — Ambulatory Visit: Payer: Medicare Other | Admitting: Family

## 2022-04-24 ENCOUNTER — Other Ambulatory Visit (HOSPITAL_COMMUNITY): Payer: Self-pay

## 2022-04-25 NOTE — Progress Notes (Signed)
Today made a home visit to see Kevin [name]. He has not been answering his phone calls and missed Heart failure clinic appt.  He states his Mom has the phone and she must not be answering it.  He had forgot about his appt.  Explained importance of going to the next appt scheduled for him next week.  He is running out of his meds and needs new scripts sent to a pharmacy.  He has medicare now and can not get medicines from medication management.  He needs to choose a pharmacy to have them sent to.  Will call around to different pharmacies and check on cost for him.  Did explain the good rx card he can get also.  He is aware that he will have to pay for his medication coming up.  Will meet him at his appt next week to share list with him.  He is not taking the gabapentin or the spiriva.  Have signed him up to get assistance to get supplement plan cost waived but has not heard back yet.  Will have to chose a plan once he hears back. He has not went to social services yet either.  He states he will be at the appt next week, clinic has a cab setup to take him.  He states been doing ok, he was sitting outside smoking, discussed smoking with him.  Will continue to visit for heart failure, diet and medication management.   [name] [place] EMT-Paramedic [phone]

## 2022-05-01 ENCOUNTER — Other Ambulatory Visit (HOSPITAL_COMMUNITY): Payer: Self-pay

## 2022-05-01 ENCOUNTER — Other Ambulatory Visit
Admission: RE | Admit: 2022-05-01 | Discharge: 2022-05-01 | Disposition: A | Discharge status: Home / Self Care | Payer: Medicare Other | Source: Ambulatory Visit | Attending: Family | Admitting: Family

## 2022-05-01 ENCOUNTER — Ambulatory Visit (HOSPITAL_BASED_OUTPATIENT_CLINIC_OR_DEPARTMENT_OTHER): Payer: Medicare Other | Admitting: Family

## 2022-05-01 ENCOUNTER — Encounter: Payer: Self-pay | Admitting: Family

## 2022-05-01 VITALS — BP 137/99 | HR 90 | Resp 20 | Ht 68.0 in | Wt 169.0 lb

## 2022-05-01 DIAGNOSIS — F17210 Nicotine dependence, cigarettes, uncomplicated: Secondary | ICD-10-CM | POA: Insufficient documentation

## 2022-05-01 DIAGNOSIS — I110 Hypertensive heart disease with heart failure: Secondary | ICD-10-CM | POA: Insufficient documentation

## 2022-05-01 DIAGNOSIS — I10 Essential (primary) hypertension: Secondary | ICD-10-CM

## 2022-05-01 DIAGNOSIS — I5042 Chronic combined systolic (congestive) and diastolic (congestive) heart failure: Secondary | ICD-10-CM

## 2022-05-01 DIAGNOSIS — J449 Chronic obstructive pulmonary disease, unspecified: Secondary | ICD-10-CM | POA: Insufficient documentation

## 2022-05-01 DIAGNOSIS — E785 Hyperlipidemia, unspecified: Secondary | ICD-10-CM

## 2022-05-01 LAB — BASIC METABOLIC PANEL
Anion gap: 7 (ref 5–15)
BUN: 13 mg/dL (ref 8–23)
CO2: 27 mmol/L (ref 22–32)
Calcium: 9.1 mg/dL (ref 8.9–10.3)
Chloride: 106 mmol/L (ref 98–111)
Creatinine, Ser: 1.05 mg/dL (ref 0.61–1.24)
GFR, Estimated: >60 mL/min (ref >60)
Glucose, Bld: 103 mg/dL — ABNORMAL HIGH (ref 70–99)
Potassium: 3.5 mmol/L (ref 3.5–5.1)
Sodium: 140 mmol/L (ref 135–145)

## 2022-05-01 MED ORDER — CARVEDILOL 25 MG PO TABS
ORAL_TABLET | Freq: Two times a day (BID) | ORAL | 3 refills | Status: DC
Start: 2022-05-01 — End: 2023-02-28

## 2022-05-01 MED ORDER — FUROSEMIDE 40 MG PO TABS
40.0000 mg | ORAL_TABLET | Freq: Every day | ORAL | 3 refills | Status: DC
Start: 2022-05-01 — End: 2023-07-31

## 2022-05-01 MED ORDER — SACUBITRIL-VALSARTAN 97-103 MG PO TABS
1.0000 | ORAL_TABLET | Freq: Two times a day (BID) | ORAL | 3 refills | Status: DC
Start: 2022-05-01 — End: 2023-02-28

## 2022-05-01 MED ORDER — SPIRONOLACTONE 25 MG PO TABS
ORAL_TABLET | Freq: Every day | ORAL | 3 refills | Status: DC
Start: 2022-05-01 — End: 2023-08-06

## 2022-05-01 MED ORDER — PRAVASTATIN SODIUM 20 MG PO TABS: ORAL_TABLET | Freq: Every day | ORAL | 3 refills | Status: DC

## 2022-05-01 MED ORDER — AMLODIPINE BESYLATE 10 MG PO TABS: ORAL_TABLET | Freq: Every day | ORAL | 3 refills | Status: DC

## 2022-05-01 NOTE — Patient Instructions (Signed)
Continue weighing daily and call for an overnight weight gain of 3 pounds or more or a weekly weight gain of more than 5 pounds.  °

## 2022-05-01 NOTE — Progress Notes (Signed)
Patient ID: Kevin [name], male    DOB: [birth year], 66 y.o.   MRN: [id number]  Mr Vicuna is a 66 y/o male with a history of HTN, COPD, tobacco use and chronic heart failure.   Echo report from 01/13/20 reviewed and showed an EF of 50-55% along with moderate LVH. Echo report from 08/04/2019 reviewed and showed an EF of 25-30% along with mild/moderate TR.   Has not been admitted or been in the ED in the last 6 months.   He presents today for a follow-up visit with no complaints.  Denies any dizziness, headaches, cough, SOB, chest pain/pressure, palpitations, abdominal distention, constipation, diarrhea, difficulty sleeping, nor pedal edema.   He weighs himself once a week. Follows a low-sodium diet and drinks mostly water.   Past Medical History:  Diagnosis Date   Chronic combined systolic (congestive) and diastolic (congestive) heart failure ([place])    a. 2019 reported neg stress test and nl cors on cath in [facility]; b. 07/2019 Echo: EF 25-30%, Gr2 DD, glob HK. Mod reduced RV fxn w/ volume overload. Mod dil RA. Mild to mod TR. Mod elev PASP.   COPD (chronic obstructive pulmonary disease) (HCC)    Diabetes mellitus without complication (HCC)    Hypertension    Noncompliance    Pleural effusion on right    a. 07/2019 Thoracentesis: 300 ml.    Social History   Tobacco Use   Smoking status: Some Days    Packs/day: 0.25    Years: 20.00    Total pack years: 5.00    Types: Cigarettes   Smokeless tobacco: Never   Tobacco comments:    a pack lasts a week (appx. 4 days)  Substance Use Topics   Alcohol use: Never   No Known Allergies  Prior to Admission medications   Medication Sig Start Date End Date Taking? Authorizing Provider  albuterol (VENTOLIN HFA) 108 (90 Base) MCG/ACT inhaler Inhale 2 puffs into the lungs every 6 (six) hours as needed for wheezing or shortness of breath. 09/08/19  Yes Darylene Price A, FNP  amLODipine (NORVASC) 10 MG tablet TAKE ONE TABLET BY MOUTH EVERY DAY 01/04/22  01/04/23 Yes Darylene Price A, FNP  Blood Pressure KIT 1 kit by Does not apply route daily. 12/23/19  Yes Iloabachie, Chioma E, NP  carvedilol (COREG) 25 MG tablet TAKE ONE TABLET BY MOUTH 2 TIMES A DAY 04/27/20  Yes Hackney, Tina A, FNP  furosemide (LASIX) 40 MG tablet Take 0.5 tablets (20 mg total) by mouth daily. Patient taking differently: Take 40 mg by mouth daily. 06/12/20  Yes Patrecia Pour, MD  pravastatin (PRAVACHOL) 20 MG tablet TAKE ONE TABLET BY MOUTH EVERY DAY 06/13/21 06/13/22 Yes Hackney, Tina A, FNP  sacubitril-valsartan (ENTRESTO) 97-103 MG Take 1 tablet by mouth 2 (two) times daily.   Yes [provider]  spironolactone (ALDACTONE) 25 MG tablet TAKE ONE TABLET BY MOUTH EVERY DAY 04/27/20  Yes Darylene Price A, FNP  gabapentin (NEURONTIN) 300 MG capsule Take 300 mg by mouth 2 (two) times daily. Patient not taking: Reported on 05/01/2022    [provider]  tiotropium (SPIRIVA) 18 MCG inhalation capsule Place 1 capsule (18 mcg total) into inhaler and inhale daily. Patient not taking: Reported on 05/01/2022 12/23/19   Caryl Asp E, NP    Review of Systems  Constitutional:  Negative for appetite change and fatigue.  HENT:  Negative for congestion, postnasal drip and sore throat.   Eyes: Negative.   Respiratory:  Negative for cough, chest tightness and shortness of breath.   Cardiovascular:  Negative for chest pain, palpitations and leg swelling.  Gastrointestinal:  Negative for abdominal distention and abdominal pain.  Endocrine: Negative.   Genitourinary: Negative.   Musculoskeletal:  Negative for back pain, myalgias and neck pain.  Skin: Negative.   Allergic/Immunologic: Negative.   Neurological:  Negative for dizziness and light-headedness.  Hematological:  Negative for adenopathy. Does not bruise/bleed easily.  Psychiatric/Behavioral:  Negative for dysphoric mood and sleep disturbance (sleeping on 1 pillows). The patient is not nervous/anxious.    Vitals:    05/01/22 1407  BP: (!) 137/99  Pulse: 90  Resp: 20  SpO2: 100%   Wt Readings from Last 3 Encounters:  05/01/22 169 lb (76.7 kg)  01/08/22 181 lb (82.1 kg)  01/04/22 181 lb (82.1 kg)   Lab Results  Component Value Date   CREATININE 0.98 10/04/2020   CREATININE 0.98 07/01/2020   CREATININE 1.46 (H) 06/12/2020    Physical Exam Vitals and nursing note reviewed.  Constitutional:      General: He is not in acute distress.    Appearance: Normal appearance.  HENT:     Head: Normocephalic and atraumatic.  Neck:     Vascular: No carotid bruit.  Cardiovascular:     Rate and Rhythm: Normal rate and regular rhythm.  Pulmonary:     Effort: Pulmonary effort is normal. No respiratory distress.     Breath sounds: No wheezing or rales.  Abdominal:     General: Abdomen is flat. There is no distension.     Palpations: Abdomen is soft.  Musculoskeletal:        General: No tenderness.     Cervical back: Normal range of motion and neck supple.     Right lower leg: No tenderness. No edema.     Left lower leg: No tenderness. No edema.  Skin:    General: Skin is warm and dry.  Neurological:     General: No focal deficit present.     Mental Status: He is alert and oriented to person, place, and time.  Psychiatric:        Mood and Affect: Mood normal.        Behavior: Behavior normal.    Assessment & Plan:  1: Chronic heart failure with preserved ejection fraction along with structural changes- - NYHA class I - euvolemic today - not weighing daily and encouraged to resume; reminded to call for an overnight weight gain of >2 pounds or a weekly weight gain of >5 pounds - weight down 5 pounds from last visit here 3 months ago - not adding salt and has been trying to read food labels; reviewed the importance of closely following a 2010m sodium diet  - saw cardiology ([place]) 01/14/20 - echo needs updated so order placed  - BNP 06/12/20 was 620.0  2: HTN- - BP mildly elevated  (137/99)  - [facility] PCP at [facility] - BMP on 10/04/20 reviewed and showed sodium 141, potassium 3.8, creatinine 0.98 and GFR 94 - Ordered BMP today   3: COPD-  - using nebulizer and inhalers - smoking 2 cigarettes daily   Medication bottles reviewed.   Return in 3 months, sooner if needed.

## 2022-05-01 NOTE — Progress Notes (Signed)
Today met with Kevin [name] at his Heart Failure clinic appt.  Savas Elvin the prices for his medications and provided to [employer] in the [place].  He agreed to go with [facility] due to they are cheaper.  Tina sent in his medications for him.  He will pick up on the way home.  Amber provided a cell phone and 2 25$ gift cards to him to assist with medications.  He was very thankful.  Cell phone has 30 days of service on it and he appeared to understand to buy a card to add more minutes after Oct 6th.  He denies any problems today. Also discussed importance of him going to social services and to make appt with PCP.  Will continue to visit for heart failure, diet and medication management.   [place] [phone]

## 2022-05-02 ENCOUNTER — Encounter: Payer: Self-pay | Admitting: Family

## 2022-05-06 ENCOUNTER — Telehealth: Payer: Self-pay | Admitting: Licensed Clinical Social Worker

## 2022-05-06 NOTE — Telephone Encounter (Signed)
Patient was provided a cell phone for communication with team through the [employer]. Lasandra Beech, LCSW, CCSW-MCS [phone]

## 2022-06-19 ENCOUNTER — Other Ambulatory Visit (HOSPITAL_COMMUNITY): Payer: Self-pay

## 2022-06-19 NOTE — Progress Notes (Signed)
Today had a quick visit with Kevin [name].  He was outside looking for his cousin.  He states she was suppose to be picking him up to take his to social services today.  He was also in the middle of feeding his mother.  He is aware he will have to pay for his medications next time.  He has about 2 months worth left. He has no minutes on his cell phone, explained to him again how to go buy a card for it.  He said he will go to [place].  He states been doing ok, cares for his mother daily, she is bed ridden.  He has everything he needs for daily living.  He is aware of appts in December coming up.  Will continue to visit for heart failure, diet and medication management.   [place] [phone]

## 2022-07-03 NOTE — Telephone Encounter (Signed)
error 

## 2022-07-23 ENCOUNTER — Other Ambulatory Visit (HOSPITAL_COMMUNITY): Payer: Self-pay

## 2022-07-24 ENCOUNTER — Encounter (HOSPITAL_COMMUNITY): Payer: Self-pay

## 2022-07-24 NOTE — Progress Notes (Signed)
Today had a home visit with Kevin [name].  He states been doing well.  He has enough medications for about 1 month, he is aware to pick them up at his pharmacy and will have to pay for them.  He has not been to social security to get a waiver for his meds.  He has not heard back about a waiver for his medical plan yet.  He has everything for daily living.  He is looking for a ride to go get some things from [employer] and to cash a cashiers check.  Advised him to check with [employer] and cab service.  He is checking with a neighbor first then them.  He is aware of up coming appts.  He denies any problems such as chest pain, headaches, dizziness or increased shortness of breath.  Will continue to visit for heart failure, diet and medication management.   [name] EMT-Paramedic [phone]

## 2022-07-31 ENCOUNTER — Ambulatory Visit: Payer: Medicare Other | Admitting: Family

## 2022-07-31 ENCOUNTER — Ambulatory Visit: Admission: RE | Admit: 2022-07-31 | Payer: Medicare Other | Source: Ambulatory Visit

## 2022-07-31 ENCOUNTER — Other Ambulatory Visit (HOSPITAL_COMMUNITY): Payer: Self-pay

## 2022-07-31 NOTE — Progress Notes (Signed)
Spoke to [place] several times yesterday and he states he would make his echo appt with his [name] appt.  Was at his [name] appt and he never showed.  Attempted to reach him with no answer.  Left message several times with no return call.  Will continue to try to reach.  His appts was rescheduled by the [name].   [name] [place] EMT-Paramedic [phone]

## 2022-08-13 ENCOUNTER — Ambulatory Visit: Payer: Medicare Other | Admitting: Family

## 2022-08-13 ENCOUNTER — Telehealth: Payer: Self-pay | Admitting: Family

## 2022-08-13 NOTE — Telephone Encounter (Signed)
Patient did not show for his Heart Failure Clinic appointment on 08/13/22. Will attempt to reschedule.   Of note, this is the 5th appointment that he has missed

## 2022-08-13 NOTE — Progress Notes (Deleted)
Patient ID: Kevin [name], male    DOB: 06-05-1956, 66 y.o.   MRN: [id number]  Kevin [name] is a 66 y/o male with a history of HTN, COPD, tobacco use and chronic heart failure.   Echo report from 06/12/20 showed an EF of 45-50% along with severe LVH and mild LAE. Echo report from 01/13/20 reviewed and showed an EF of 50-55% along with moderate LVH. Echo report from 08/04/2019 reviewed and showed an EF of 25-30% along with mild/moderate TR.   Has not been admitted or been in the ED in the last 6 months.   He presents today for a follow-up visit with no complaints.    Past Medical History:  Diagnosis Date   Chronic combined systolic (congestive) and diastolic (congestive) heart failure (HCC)    a. 2019 reported neg stress test and nl cors on cath in [facility]; b. 07/2019 Echo: EF 25-30%, Gr2 DD, glob HK. Mod reduced RV fxn w/ volume overload. Mod dil RA. Mild to mod TR. Mod elev PASP.   COPD (chronic obstructive pulmonary disease) (HCC)    Diabetes mellitus without complication (HCC)    Hypertension    Noncompliance    Pleural effusion on right    a. 07/2019 Thoracentesis: 300 ml.    Social History   Tobacco Use   Smoking status: Some Days    Packs/day: 0.25    Years: 20.00    Total pack years: 5.00    Types: Cigarettes   Smokeless tobacco: Never   Tobacco comments:    a pack lasts a week (appx. 4 days)  Substance Use Topics   Alcohol use: Never   No Known Allergies    Review of Systems  Constitutional:  Negative for appetite change and fatigue.  HENT:  Negative for congestion, postnasal drip and sore throat.   Eyes: Negative.   Respiratory:  Negative for cough, chest tightness and shortness of breath.   Cardiovascular:  Negative for chest pain, palpitations and leg swelling.  Gastrointestinal:  Negative for abdominal distention and abdominal pain.  Endocrine: Negative.   Genitourinary: Negative.   Musculoskeletal:  Negative for back pain, myalgias and neck pain.  Skin:  Negative.   Allergic/Immunologic: Negative.   Neurological:  Negative for dizziness and light-headedness.  Hematological:  Negative for adenopathy. Does not bruise/bleed easily.  Psychiatric/Behavioral:  Negative for dysphoric mood and sleep disturbance (sleeping on 1 pillows). The patient is not nervous/anxious.       Physical Exam Vitals and nursing note reviewed.  Constitutional:      General: He is not in acute distress.    Appearance: Normal appearance.  HENT:     Head: Normocephalic and atraumatic.  Neck:     Vascular: No carotid bruit.  Cardiovascular:     Rate and Rhythm: Normal rate and regular rhythm.  Pulmonary:     Effort: Pulmonary effort is normal. No respiratory distress.     Breath sounds: No wheezing or rales.  Abdominal:     General: Abdomen is flat. There is no distension.     Palpations: Abdomen is soft.  Musculoskeletal:        General: No tenderness.     Cervical back: Normal range of motion and neck supple.     Right lower leg: No tenderness. No edema.     Left lower leg: No tenderness. No edema.  Skin:    General: Skin is warm and dry.  Neurological:     General: No focal deficit present.  Mental Status: He is alert and oriented to person, place, and time.  Psychiatric:        Mood and Affect: Mood normal.        Behavior: Behavior normal.    Assessment & Plan:  1: Chronic heart failure with mildly reduced ejection fraction- - NYHA class I - euvolemic today - not weighing daily and encouraged to resume; reminded to call for an overnight weight gain of >2 pounds or a weekly weight gain of >5 pounds - weight 169 pounds from last visit here 3 months ago - not adding salt and has been trying to read food labels; reviewed the importance of closely following a 2000mg  sodium diet  - saw cardiology ([place]) 01/14/20 - echo needs updated; he was NS for 07/31/22 echo - BNP 06/12/20 was 620.0  2: HTN- - BP  - saw PCP at [facility]  08/30/20 - BMP on 05/01/22 reviewed and showed sodium 140, potassium 3.5, creatinine 1.05 and GFR >60 - Ordered BMP today   3: COPD-  - using nebulizer and inhalers - smoking 2 cigarettes daily   Medication bottles reviewed.

## 2022-09-04 ENCOUNTER — Ambulatory Visit: Payer: Medicare Other

## 2022-09-18 ENCOUNTER — Other Ambulatory Visit (HOSPITAL_COMMUNITY): Payer: Self-pay

## 2022-09-19 ENCOUNTER — Encounter (HOSPITAL_COMMUNITY): Payer: Self-pay

## 2022-09-19 NOTE — Progress Notes (Signed)
Today had a home visit with Kevin [name].  He has missed last several appt with [name].  He explained hard to leave his mother and he never knows when his sister may come.  Did talk with Otila Kluver and if he could call them the day of they will work him in.  Will advise him.  He is doing well, he has picked up refills from [place].  He is aware of how to take them.  He has know complaints of any problems such as chest pain, headaches, dizziness or increased shortness of breath.  He has everything for daily living.  He has no edema in lower legs, abdomen is soft and lungs are clear.  Will continue to visit for heart failure, diet and medication management.   [place] [phone]

## 2023-02-28 ENCOUNTER — Emergency Department
Admission: EM | Admit: 2023-02-28 | Discharge: 2023-02-28 | Disposition: A | Discharge status: Home / Self Care | Payer: Medicare HMO | Attending: Student in an Organized Health Care Education/Training Program | Admitting: Student in an Organized Health Care Education/Training Program

## 2023-02-28 ENCOUNTER — Other Ambulatory Visit: Payer: Self-pay

## 2023-02-28 ENCOUNTER — Emergency Department: Payer: Medicare HMO

## 2023-02-28 DIAGNOSIS — I10 Essential (primary) hypertension: Secondary | ICD-10-CM | POA: Insufficient documentation

## 2023-02-28 DIAGNOSIS — R109 Unspecified abdominal pain: Secondary | ICD-10-CM | POA: Insufficient documentation

## 2023-02-28 DIAGNOSIS — I159 Secondary hypertension, unspecified: Secondary | ICD-10-CM | POA: Insufficient documentation

## 2023-02-28 DIAGNOSIS — K429 Umbilical hernia without obstruction or gangrene: Secondary | ICD-10-CM | POA: Diagnosis not present

## 2023-02-28 DIAGNOSIS — N289 Disorder of kidney and ureter, unspecified: Secondary | ICD-10-CM | POA: Diagnosis not present

## 2023-02-28 DIAGNOSIS — E785 Hyperlipidemia, unspecified: Secondary | ICD-10-CM

## 2023-02-28 DIAGNOSIS — M545 Low back pain, unspecified: Secondary | ICD-10-CM | POA: Diagnosis not present

## 2023-02-28 DIAGNOSIS — M5459 Other low back pain: Secondary | ICD-10-CM | POA: Diagnosis not present

## 2023-02-28 DIAGNOSIS — N281 Cyst of kidney, acquired: Secondary | ICD-10-CM | POA: Diagnosis not present

## 2023-02-28 DIAGNOSIS — K449 Diaphragmatic hernia without obstruction or gangrene: Secondary | ICD-10-CM | POA: Diagnosis not present

## 2023-02-28 LAB — CBC WITH DIFFERENTIAL/PLATELET
Abs Immature Granulocytes: 0.01 10*3/uL (ref 0.00–0.07)
Basophils Absolute: 0 10*3/uL (ref 0.0–0.1)
Basophils Relative: 1 %
Eosinophils Absolute: 0.3 10*3/uL (ref 0.0–0.5)
Eosinophils Relative: 5 %
HCT: 40.5 % (ref 39.0–52.0)
Hemoglobin: 13.8 g/dL (ref 13.0–17.0)
Immature Granulocytes: 0 %
Lymphocytes Relative: 39 %
Lymphs Abs: 2.4 10*3/uL (ref 0.7–4.0)
MCH: 32.8 pg (ref 26.0–34.0)
MCHC: 34.1 g/dL (ref 30.0–36.0)
MCV: 96.2 fL (ref 80.0–100.0)
Monocytes Absolute: 0.6 10*3/uL (ref 0.1–1.0)
Monocytes Relative: 9 %
Neutro Abs: 2.9 10*3/uL (ref 1.7–7.7)
Neutrophils Relative %: 46 %
Platelets: 189 10*3/uL (ref 150–400)
RBC: 4.21 MIL/uL — ABNORMAL LOW (ref 4.22–5.81)
RDW: 13.2 % (ref 11.5–15.5)
WBC: 6.2 10*3/uL (ref 4.0–10.5)
nRBC: 0 % (ref 0.0–0.2)

## 2023-02-28 LAB — BASIC METABOLIC PANEL
Anion gap: 8 (ref 5–15)
BUN: 16 mg/dL (ref 8–23)
CO2: 25 mmol/L (ref 22–32)
Calcium: 8.9 mg/dL (ref 8.9–10.3)
Chloride: 106 mmol/L (ref 98–111)
Creatinine, Ser: 0.98 mg/dL (ref 0.61–1.24)
GFR, Estimated: >60 mL/min (ref >60)
Glucose, Bld: 82 mg/dL (ref 70–99)
Potassium: 3.5 mmol/L (ref 3.5–5.1)
Sodium: 139 mmol/L (ref 135–145)

## 2023-02-28 MED ORDER — AMLODIPINE BESYLATE 10 MG PO TABS
ORAL_TABLET | Freq: Every day | ORAL | 3 refills | Status: DC
Start: 2023-02-28 — End: 2023-07-31

## 2023-02-28 MED ORDER — PRAVASTATIN SODIUM 20 MG PO TABS
ORAL_TABLET | Freq: Every day | ORAL | 3 refills | Status: DC
Start: 2023-02-28 — End: 2024-06-21

## 2023-02-28 MED ORDER — SACUBITRIL-VALSARTAN 97-103 MG PO TABS: 1.0000 | ORAL_TABLET | Freq: Two times a day (BID) | ORAL | 3 refills | Status: DC

## 2023-02-28 MED ORDER — AMLODIPINE BESYLATE 5 MG PO TABS
10.0000 mg | ORAL_TABLET | Freq: Once | ORAL | Status: AC
  Administered 2023-02-28: 10 mg via ORAL
  Filled 2023-02-28: qty 2

## 2023-02-28 MED ORDER — OXYCODONE-ACETAMINOPHEN 5-325 MG PO TABS
1.0000 | ORAL_TABLET | Freq: Once | ORAL | Status: AC
  Administered 2023-02-28: 1 via ORAL
  Filled 2023-02-28: qty 1

## 2023-02-28 MED ORDER — CARVEDILOL 25 MG PO TABS: ORAL_TABLET | Freq: Two times a day (BID) | ORAL | 3 refills | Status: DC

## 2023-02-28 MED ORDER — OXYCODONE-ACETAMINOPHEN 5-325 MG PO TABS: 1.0000 | ORAL_TABLET | ORAL | 0 refills | Status: DC | PRN

## 2023-02-28 MED ORDER — CARVEDILOL 25 MG PO TABS
25.0000 mg | ORAL_TABLET | Freq: Two times a day (BID) | ORAL | Status: DC
  Administered 2023-02-28: 25 mg via ORAL
  Filled 2023-02-28: qty 1

## 2023-02-28 MED ORDER — SACUBITRIL-VALSARTAN 97-103 MG PO TABS
1.0000 | ORAL_TABLET | Freq: Two times a day (BID) | ORAL | Status: DC
  Administered 2023-02-28: 1 via ORAL
  Filled 2023-02-28: qty 1

## 2023-02-28 NOTE — ED Triage Notes (Signed)
Pt comes with c/o back pain. Pt states this all started to 5 days ago. 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

## 2023-02-28 NOTE — ED Provider Notes (Signed)
[facility] Provider Note    Event Date/Time   First MD Initiated Contact with Patient 02/28/23 1538     (approximate)   History   Back Pain   HPI  Kevin [name] is a 67 y.o. male with a history of hypertension not currently taking his antihypertensive medication presents to the ER for evaluation of 5 days of of mid and low back pain.  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     Physical Exam   Triage Vital Signs: ED Triage Vitals  Enc Vitals Group     BP 02/28/23 1504 (!) 213/120     Pulse Rate 02/28/23 1504 80     Resp 02/28/23 1504 18     Temp 02/28/23 1504 97.7 F (36.5 C)     Temp src --      SpO2 02/28/23 1504 97 %     Weight --      Height --      Head Circumference --      Peak Flow --      Pain Score 02/28/23 1503 10     Pain Loc --      Pain Edu? --      Excl. in GC? --     Most recent vital signs: Vitals:   02/28/23 1545 02/28/23 1651  BP: (!) 210/126 (!) 215/124  Pulse: 63 60  Resp: 14 20  Temp:    SpO2: 99% 99%     Constitutional: Alert  Eyes: Conjunctivae are normal.  Head: Atraumatic. Nose: No congestion/rhinnorhea. Mouth/Throat: Mucous membranes are moist.   Neck: Painless ROM.  Cardiovascular:   Good peripheral circulation. Respiratory: Normal respiratory effort.  No retractions.  Gastrointestinal: Soft and nontender.  Musculoskeletal:  no deformity Neurologic:  MAE spontaneously. No gross focal neurologic deficits are appreciated.  Skin:  Skin is warm, dry and intact. No rash noted. Psychiatric: Mood and affect are normal. Speech and behavior are normal.    ED Results / Procedures / Treatments   Labs (all labs ordered are listed, but only abnormal results are displayed) Labs Reviewed  CBC WITH DIFFERENTIAL/PLATELET - Abnormal; Notable for the following components:      Result Value   RBC 4.21 (*)    All  other components within normal limits  BASIC METABOLIC PANEL     EKG     RADIOLOGY Please see ED Course for my review and interpretation.  I personally reviewed all radiographic images ordered to evaluate for the above acute complaints and reviewed radiology reports and findings.  These findings were personally discussed with the patient.  Please see medical record for radiology report.    PROCEDURES:  Critical Care performed:   Procedures   MEDICATIONS ORDERED IN ED: Medications  carvedilol (COREG) tablet 25 mg (25 mg Oral Given 02/28/23 1621)  sacubitril-valsartan (ENTRESTO) 97-103 mg per tablet (1 tablet Oral Given 02/28/23 1711)  oxyCODONE-acetaminophen (PERCOCET/ROXICET) 5-325 MG per tablet 1 tablet (1 tablet Oral Given 02/28/23 1621)  amLODipine (NORVASC) tablet 10 mg (10 mg Oral Given 02/28/23 1621)     IMPRESSION / MDM / ASSESSMENT AND PLAN / ED COURSE  I reviewed the triage vital signs and the nursing notes.  Differential diagnosis includes, but is not limited to, lumbago, musculoskeletal strain, fracture, contusion, AAA, dissection, sciatica  Patient presenting to the ER for evaluation of symptoms as described above.  Based on symptoms, risk factors and considered above differential, this presenting complaint could reflect a potentially life-threatening illness therefore the patient will be placed on continuous pulse oximetry and telemetry for monitoring.  Laboratory evaluation will be sent to evaluate for the above complaints.      Clinical Course as of 02/28/23 1720  Caleen Essex Feb 28, 2023  1647 CT Renal [place] [PR]    Clinical Course User Index [PR] Willy Eddy, MD   Patient's pain is resolved.  Was given his home meds.  Home meds will be sent represcribed for him so that he can take them.  CT imaging without evidence of acute abnormality.  No sign of AAA on my review and interpretation.  Given his history of longstanding  hypertension not currently on his home meds do believe he is appropriate for outpatient follow-up as we are sending prescription for his home meds.  Patient agreeable plan.  FINAL CLINICAL IMPRESSION(S) / ED DIAGNOSES   Final diagnoses:  Acute midline low back pain without sciatica  Secondary hypertension     Rx / DC Orders   ED Discharge Orders          Ordered    amLODipine (NORVASC) 10 MG tablet  Daily        02/28/23 1716    carvedilol (COREG) 25 MG tablet  2 times daily        02/28/23 1716    pravastatin (PRAVACHOL) 20 MG tablet  Daily        02/28/23 1716    sacubitril-valsartan (ENTRESTO) 97-103 MG  2 times daily        02/28/23 1716    oxyCODONE-acetaminophen (PERCOCET) 5-325 MG tablet  Every 4 hours PRN        02/28/23 1716             Note:  This document was prepared using Dragon voice recognition software and may include unintentional dictation errors.    Willy Eddy, MD 02/28/23 [phone]

## 2023-03-06 ENCOUNTER — Telehealth: Payer: Self-pay

## 2023-03-06 NOTE — Telephone Encounter (Signed)
Transition Care Management Unsuccessful Follow-up Telephone Call  Date of discharge and from where:  [place] 7/5  Attempts:  1st Attempt  Reason for unsuccessful TCM follow-up call:  No answer/busy   Tashonna Descoteaux [facility], [place] [phone] [address], [place], [place] [address] Phone: [phone] Email: [email or web address]       

## 2023-03-07 ENCOUNTER — Telehealth: Payer: Self-pay

## 2023-03-07 NOTE — Telephone Encounter (Signed)
Transition Care Management Unsuccessful Follow-up Telephone Call  Date of discharge and from where:  Kevin [name] 7/5   Attempts:  2nd Attempt  Reason for unsuccessful TCM follow-up call:  Left voice message   [name] [facility] Guide, [facility] [phone] [address] [place], [place], [place] [address] Phone: [phone] Email: [email or web address]

## 2023-07-10 ENCOUNTER — Inpatient Hospital Stay
Admission: EM | Admit: 2023-07-10 | Discharge: 2023-07-31 | DRG: 291 | Disposition: A | Discharge status: Home / Self Care | Payer: Medicare HMO | Attending: Internal Medicine | Admitting: Internal Medicine

## 2023-07-10 ENCOUNTER — Other Ambulatory Visit: Payer: Self-pay

## 2023-07-10 ENCOUNTER — Emergency Department: Payer: Medicare HMO

## 2023-07-10 ENCOUNTER — Encounter: Payer: Self-pay | Admitting: Emergency Medicine

## 2023-07-10 DIAGNOSIS — I509 Heart failure, unspecified: Secondary | ICD-10-CM | POA: Diagnosis not present

## 2023-07-10 DIAGNOSIS — E876 Hypokalemia: Secondary | ICD-10-CM

## 2023-07-10 DIAGNOSIS — I700 Atherosclerosis of aorta: Secondary | ICD-10-CM | POA: Diagnosis not present

## 2023-07-10 DIAGNOSIS — D539 Nutritional anemia, unspecified: Secondary | ICD-10-CM | POA: Diagnosis present

## 2023-07-10 DIAGNOSIS — I420 Dilated cardiomyopathy: Secondary | ICD-10-CM | POA: Diagnosis not present

## 2023-07-10 DIAGNOSIS — I4892 Unspecified atrial flutter: Secondary | ICD-10-CM | POA: Diagnosis not present

## 2023-07-10 DIAGNOSIS — I5082 Biventricular heart failure: Secondary | ICD-10-CM | POA: Diagnosis present

## 2023-07-10 DIAGNOSIS — M7989 Other specified soft tissue disorders: Secondary | ICD-10-CM | POA: Diagnosis not present

## 2023-07-10 DIAGNOSIS — R339 Retention of urine, unspecified: Secondary | ICD-10-CM | POA: Diagnosis not present

## 2023-07-10 DIAGNOSIS — L03115 Cellulitis of right lower limb: Secondary | ICD-10-CM | POA: Diagnosis present

## 2023-07-10 DIAGNOSIS — K761 Chronic passive congestion of liver: Secondary | ICD-10-CM | POA: Diagnosis present

## 2023-07-10 DIAGNOSIS — S90821A Blister (nonthermal), right foot, initial encounter: Secondary | ICD-10-CM | POA: Diagnosis present

## 2023-07-10 DIAGNOSIS — J449 Chronic obstructive pulmonary disease, unspecified: Secondary | ICD-10-CM | POA: Diagnosis present

## 2023-07-10 DIAGNOSIS — Z7901 Long term (current) use of anticoagulants: Secondary | ICD-10-CM | POA: Diagnosis not present

## 2023-07-10 DIAGNOSIS — I517 Cardiomegaly: Secondary | ICD-10-CM | POA: Diagnosis not present

## 2023-07-10 DIAGNOSIS — F17210 Nicotine dependence, cigarettes, uncomplicated: Secondary | ICD-10-CM | POA: Diagnosis present

## 2023-07-10 DIAGNOSIS — Z79899 Other long term (current) drug therapy: Secondary | ICD-10-CM | POA: Diagnosis not present

## 2023-07-10 DIAGNOSIS — E785 Hyperlipidemia, unspecified: Secondary | ICD-10-CM | POA: Diagnosis not present

## 2023-07-10 DIAGNOSIS — N39490 Overflow incontinence: Secondary | ICD-10-CM | POA: Diagnosis not present

## 2023-07-10 DIAGNOSIS — I110 Hypertensive heart disease with heart failure: Principal | ICD-10-CM | POA: Diagnosis present

## 2023-07-10 DIAGNOSIS — E559 Vitamin D deficiency, unspecified: Secondary | ICD-10-CM | POA: Diagnosis present

## 2023-07-10 DIAGNOSIS — Z5982 Transportation insecurity: Secondary | ICD-10-CM | POA: Diagnosis not present

## 2023-07-10 DIAGNOSIS — F419 Anxiety disorder, unspecified: Secondary | ICD-10-CM | POA: Diagnosis present

## 2023-07-10 DIAGNOSIS — G629 Polyneuropathy, unspecified: Secondary | ICD-10-CM

## 2023-07-10 DIAGNOSIS — I10 Essential (primary) hypertension: Secondary | ICD-10-CM | POA: Insufficient documentation

## 2023-07-10 DIAGNOSIS — G3184 Mild cognitive impairment, so stated: Secondary | ICD-10-CM | POA: Diagnosis not present

## 2023-07-10 DIAGNOSIS — Z91148 Patient's other noncompliance with medication regimen for other reason: Secondary | ICD-10-CM

## 2023-07-10 DIAGNOSIS — R7401 Elevation of levels of liver transaminase levels: Secondary | ICD-10-CM | POA: Diagnosis not present

## 2023-07-10 DIAGNOSIS — E1140 Type 2 diabetes mellitus with diabetic neuropathy, unspecified: Secondary | ICD-10-CM | POA: Diagnosis present

## 2023-07-10 DIAGNOSIS — Z5986 Financial insecurity: Secondary | ICD-10-CM

## 2023-07-10 DIAGNOSIS — I5021 Acute systolic (congestive) heart failure: Secondary | ICD-10-CM | POA: Diagnosis not present

## 2023-07-10 DIAGNOSIS — I5043 Acute on chronic combined systolic (congestive) and diastolic (congestive) heart failure: Secondary | ICD-10-CM | POA: Diagnosis not present

## 2023-07-10 DIAGNOSIS — R600 Localized edema: Principal | ICD-10-CM | POA: Diagnosis present

## 2023-07-10 DIAGNOSIS — Z1152 Encounter for screening for COVID-19: Secondary | ICD-10-CM | POA: Diagnosis not present

## 2023-07-10 DIAGNOSIS — I5023 Acute on chronic systolic (congestive) heart failure: Secondary | ICD-10-CM | POA: Diagnosis present

## 2023-07-10 HISTORY — DX: Unspecified systolic (congestive) heart failure: I50.20

## 2023-07-10 HISTORY — DX: Tobacco use: Z72.0

## 2023-07-10 HISTORY — DX: Chronic systolic (congestive) heart failure: I50.22

## 2023-07-10 HISTORY — DX: Other cardiomyopathies: I42.8

## 2023-07-10 LAB — BRAIN NATRIURETIC PEPTIDE: B Natriuretic Peptide: 2153.9 pg/mL — ABNORMAL HIGH (ref 0.0–100.0)

## 2023-07-10 LAB — CBC WITH DIFFERENTIAL/PLATELET
Abs Immature Granulocytes: 0.04 10*3/uL (ref 0.00–0.07)
Basophils Absolute: 0 10*3/uL (ref 0.0–0.1)
Basophils Relative: 0 %
Eosinophils Absolute: 0 10*3/uL (ref 0.0–0.5)
Eosinophils Relative: 0 %
HCT: 44 % (ref 39.0–52.0)
Hemoglobin: 14.3 g/dL (ref 13.0–17.0)
Immature Granulocytes: 0 %
Lymphocytes Relative: 16 %
Lymphs Abs: 1.6 10*3/uL (ref 0.7–4.0)
MCH: 33.6 pg (ref 26.0–34.0)
MCHC: 32.5 g/dL (ref 30.0–36.0)
MCV: 103.3 fL — ABNORMAL HIGH (ref 80.0–100.0)
Monocytes Absolute: 0.6 10*3/uL (ref 0.1–1.0)
Monocytes Relative: 6 %
Neutro Abs: 7.7 10*3/uL (ref 1.7–7.7)
Neutrophils Relative %: 78 %
Platelets: 235 10*3/uL (ref 150–400)
RBC: 4.26 MIL/uL (ref 4.22–5.81)
RDW: 16.2 % — ABNORMAL HIGH (ref 11.5–15.5)
WBC: 10 10*3/uL (ref 4.0–10.5)
nRBC: 0 % (ref 0.0–0.2)

## 2023-07-10 LAB — COMPREHENSIVE METABOLIC PANEL
ALT: 287 U/L — ABNORMAL HIGH (ref 0–44)
AST: 190 U/L — ABNORMAL HIGH (ref 15–41)
Albumin: 3.7 g/dL (ref 3.5–5.0)
Alkaline Phosphatase: 242 U/L — ABNORMAL HIGH (ref 38–126)
Anion gap: 9 (ref 5–15)
BUN: 33 mg/dL — ABNORMAL HIGH (ref 8–23)
CO2: 29 mmol/L (ref 22–32)
Calcium: 8.9 mg/dL (ref 8.9–10.3)
Chloride: 99 mmol/L (ref 98–111)
Creatinine, Ser: 1.23 mg/dL (ref 0.61–1.24)
GFR, Estimated: >60 mL/min (ref >60)
Glucose, Bld: 92 mg/dL (ref 70–99)
Potassium: 3.4 mmol/L — ABNORMAL LOW (ref 3.5–5.1)
Sodium: 137 mmol/L (ref 135–145)
Total Bilirubin: 4.6 mg/dL — ABNORMAL HIGH (ref <1.2)
Total Protein: 7.2 g/dL (ref 6.5–8.1)

## 2023-07-10 LAB — TROPONIN I (HIGH SENSITIVITY): Troponin I (High Sensitivity): 114 ng/L (ref <18)

## 2023-07-10 LAB — BILIRUBIN, DIRECT: Bilirubin, Direct: 1.8 mg/dL — ABNORMAL HIGH (ref 0.0–0.2)

## 2023-07-10 LAB — LIPASE, BLOOD: Lipase: 34 U/L (ref 11–51)

## 2023-07-10 LAB — AMMONIA: Ammonia: 31 umol/L (ref 9–35)

## 2023-07-10 MED ORDER — FUROSEMIDE 10 MG/ML IJ SOLN
80.0000 mg | Freq: Once | INTRAMUSCULAR | Status: AC
  Administered 2023-07-10: 80 mg via INTRAVENOUS
  Filled 2023-07-10: qty 8

## 2023-07-10 MED ORDER — ONDANSETRON HCL 4 MG/2ML IJ SOLN
4.0000 mg | Freq: Once | INTRAMUSCULAR | Status: AC
  Administered 2023-07-10: 4 mg via INTRAVENOUS
  Filled 2023-07-10: qty 2

## 2023-07-10 MED ORDER — MORPHINE SULFATE (PF) 4 MG/ML IV SOLN
4.0000 mg | Freq: Once | INTRAVENOUS | Status: AC
  Administered 2023-07-10: 4 mg via INTRAVENOUS
  Filled 2023-07-10: qty 1

## 2023-07-10 NOTE — ED Provider Notes (Signed)
[facility] Provider Note    Event Date/Time   First MD Initiated Contact with Patient 07/10/23 2110     (approximate)   History   Leg Swelling   HPI  Kevin [name] is a 67 y.o. male with a history of hypertension, CHF, COPD, and diabetes who presents with bilateral lower extremity swelling mainly to the feet and lower legs, gradual onset but acutely worsened over the last week and associated with blistering and drainage of clear fluid from the right foot.  The patient also reports increased shortness of breath over the last several days.  He denies cough or fever.  He has no chest or abdominal pain.  He denies any vomiting or diarrhea.  The patient states that he is not currently on any diuretic.  I reviewed the past medical records.  The patient's most recent outpatient encounter was a paramedic home visit in January.  The patient appears to previously have followed with the heart failure clinic but missed several appointments last year.  He was seen in the ED on 7/5 of this year for back pain.   Physical Exam   Triage Vital Signs: ED Triage Vitals  Encounter Vitals Group     BP 07/10/23 1626 (!) 143/109     Systolic BP Percentile --      Diastolic BP Percentile --      Pulse Rate 07/10/23 1626 (!) 129     Resp 07/10/23 1626 18     Temp 07/10/23 1626 97.9 F (36.6 C)     Temp Source 07/10/23 1626 Oral     SpO2 07/10/23 1626 100 %     Weight --      Height --      Head Circumference --      Peak Flow --      Pain Score 07/10/23 1655 0     Pain Loc --      Pain Education --      Exclude from Growth Chart --     Most recent vital signs: Vitals:   07/10/23 1626 07/10/23 2141  BP: (!) 143/109 (!) 138/100  Pulse: (!) 129 (!) 120  Resp: 18 17  Temp: 97.9 F (36.6 C) 98 F (36.7 C)  SpO2: 100% 98%     General: Awake, no distress.  CV:  Good peripheral perfusion.  Lungs CTAB. Resp:  Normal effort.  Abd:  Soft and nontender.  No distention.   Other:  Faint scleral icterus.  No jaundice.  2+ bilateral lower extremity edema.  Superficial skin breakdown to the dorsum of the right foot with no erythema, induration, or abnormal warmth.  No purulent drainage.   ED Results / Procedures / Treatments   Labs (all labs ordered are listed, but only abnormal results are displayed) Labs Reviewed  CBC WITH DIFFERENTIAL/PLATELET - Abnormal; Notable for the following components:      Result Value   MCV 103.3 (*)    RDW 16.2 (*)    All other components within normal limits  COMPREHENSIVE METABOLIC PANEL - Abnormal; Notable for the following components:   Potassium 3.4 (*)    BUN 33 (*)    AST 190 (*)    ALT 287 (*)    Alkaline Phosphatase 242 (*)    Total Bilirubin 4.6 (*)    All other components within normal limits  BRAIN NATRIURETIC PEPTIDE - Abnormal; Notable for the following components:   B Natriuretic Peptide 2,153.9 (*)    All  other components within normal limits  AMMONIA  BILIRUBIN, DIRECT  LIPASE, BLOOD  TROPONIN I (HIGH SENSITIVITY)     EKG  ED ECG REPORT I, Dionne Bucy, the attending physician, personally viewed and interpreted this ECG.  Date: 07/10/2023 EKG Time: 2216 Rate: 128 Rhythm: Sinus tachycardia QRS Axis: Left axis Intervals: normal ST/T Wave abnormalities: VH with repolarization abnormality, nonspecific T wave abnormalities Narrative Interpretation: Nonspecific abnormalities with no evidence of acute ischemia; no significant change when compared to EKG of 07/01/2020    RADIOLOGY  Chest x-ray: Pending   PROCEDURES:  Critical Care performed: No  Procedures   MEDICATIONS ORDERED IN ED: Medications  furosemide (LASIX) injection 80 mg (80 mg Intravenous Given 07/10/23 2226)  ondansetron (ZOFRAN) injection 4 mg (4 mg Intravenous Given 07/10/23 2226)  morphine (PF) 4 MG/ML injection 4 mg (4 mg Intravenous Given 07/10/23 2226)     IMPRESSION / MDM / ASSESSMENT AND PLAN / ED COURSE   I reviewed the triage vital signs and the nursing notes.  67 year old male with PMH as noted above presents with worsening bilateral lower extremity edema, some clear drainage from the right foot, and increased shortness of breath.  On exam the patient has significant bilateral lower extremity edema.  He is tachycardic but not tachypneic or hypoxic.  Lungs are clear to auscultation.  Differential diagnosis includes, but is not limited to, CHF exacerbation, dependent edema, venous stasis, lymphedema.  Patient's presentation is most consistent with acute presentation with potential threat to life or bodily function.  The patient is on the cardiac monitor to evaluate for evidence of arrhythmia and/or significant heart rate changes.  Initial lab workup revealed significantly elevated BNP.  CMP also shows elevated bilirubin and LFTs.  The patient's last labs are from several years ago so the chronicity of this finding is unclear.  The patient does not have any jaundice.  The abdomen is mildly distended but nontender.  I suspect this is either related to the CHF or chronic liver disease rather than an acute biliary obstruction given the lack of pain, vomiting, or other GI symptoms.  We will obtain additional labs, chest x-ray, give IV Lasix, and reassess.  The patient will need admission for further management.  ----------------------------------------- 11:11 PM on 07/10/2023 -----------------------------------------  Ammonia is normal.  Lipase and direct bilirubin are pending.  Troponin is pending.  Lasix has been given.  I consulted Dr. Arville Care from the hospitalist service; based on our discussion he agrees to evaluate the patient for admission.  FINAL CLINICAL IMPRESSION(S) / ED DIAGNOSES   Final diagnoses:  Peripheral edema  Acute on chronic congestive heart failure, unspecified heart failure type (HCC)     Rx / DC Orders   ED Discharge Orders     None        Note:  This document  was prepared using Dragon voice recognition software and may include unintentional dictation errors.    Dionne Bucy, MD 07/10/23 2312

## 2023-07-10 NOTE — ED Triage Notes (Signed)
Pt via POV from home. Pt c/o bilateral feet swelling for the past couple of weeks, states that on Sunday the swelling got so bad it started to break skin. Swelling to the bilateral feet. Wound to the R foot. Pt is A&Ox4 and NAD.

## 2023-07-11 ENCOUNTER — Encounter: Payer: Self-pay | Admitting: Family Medicine

## 2023-07-11 ENCOUNTER — Other Ambulatory Visit (HOSPITAL_COMMUNITY): Payer: Self-pay

## 2023-07-11 DIAGNOSIS — I5043 Acute on chronic combined systolic (congestive) and diastolic (congestive) heart failure: Secondary | ICD-10-CM

## 2023-07-11 DIAGNOSIS — E785 Hyperlipidemia, unspecified: Secondary | ICD-10-CM | POA: Insufficient documentation

## 2023-07-11 DIAGNOSIS — I10 Essential (primary) hypertension: Secondary | ICD-10-CM | POA: Insufficient documentation

## 2023-07-11 DIAGNOSIS — I4892 Unspecified atrial flutter: Secondary | ICD-10-CM | POA: Diagnosis not present

## 2023-07-11 DIAGNOSIS — E876 Hypokalemia: Secondary | ICD-10-CM

## 2023-07-11 DIAGNOSIS — G629 Polyneuropathy, unspecified: Secondary | ICD-10-CM

## 2023-07-11 LAB — TSH: TSH: 4.19 u[IU]/mL (ref 0.350–4.500)

## 2023-07-11 LAB — CBC
HCT: 37.7 % — ABNORMAL LOW (ref 39.0–52.0)
Hemoglobin: 12.4 g/dL — ABNORMAL LOW (ref 13.0–17.0)
MCH: 33.3 pg (ref 26.0–34.0)
MCHC: 32.9 g/dL (ref 30.0–36.0)
MCV: 101.3 fL — ABNORMAL HIGH (ref 80.0–100.0)
Platelets: 210 10*3/uL (ref 150–400)
RBC: 3.72 MIL/uL — ABNORMAL LOW (ref 4.22–5.81)
RDW: 16 % — ABNORMAL HIGH (ref 11.5–15.5)
WBC: 10.3 10*3/uL (ref 4.0–10.5)
nRBC: 0 % (ref 0.0–0.2)

## 2023-07-11 LAB — HEPARIN LEVEL (UNFRACTIONATED)
Heparin Unfractionated: 0.4 [IU]/mL (ref 0.30–0.70)
Heparin Unfractionated: 0.3 [IU]/mL (ref 0.30–0.70)

## 2023-07-11 LAB — PHOSPHORUS: Phosphorus: 3.2 mg/dL (ref 2.5–4.6)

## 2023-07-11 LAB — VITAMIN B12: Vitamin B-12: 1477 pg/mL — ABNORMAL HIGH (ref 180–914)

## 2023-07-11 LAB — PROTIME-INR
INR: 1.6 — ABNORMAL HIGH (ref 0.8–1.2)
Prothrombin Time: 18.9 s — ABNORMAL HIGH (ref 11.4–15.2)

## 2023-07-11 LAB — BASIC METABOLIC PANEL
Anion gap: 13 (ref 5–15)
BUN: 29 mg/dL — ABNORMAL HIGH (ref 8–23)
CO2: 26 mmol/L (ref 22–32)
Calcium: 8.1 mg/dL — ABNORMAL LOW (ref 8.9–10.3)
Chloride: 101 mmol/L (ref 98–111)
Creatinine, Ser: 1.09 mg/dL (ref 0.61–1.24)
GFR, Estimated: >60 mL/min (ref >60)
Glucose, Bld: 144 mg/dL — ABNORMAL HIGH (ref 70–99)
Potassium: 3.1 mmol/L — ABNORMAL LOW (ref 3.5–5.1)
Sodium: 140 mmol/L (ref 135–145)

## 2023-07-11 LAB — HIV ANTIBODY (ROUTINE TESTING W REFLEX): HIV Screen 4th Generation wRfx: NONREACTIVE

## 2023-07-11 LAB — MAGNESIUM: Magnesium: 2.1 mg/dL (ref 1.7–2.4)

## 2023-07-11 LAB — TROPONIN I (HIGH SENSITIVITY): Troponin I (High Sensitivity): 125 ng/L (ref <18)

## 2023-07-11 LAB — APTT
aPTT: >200 s (ref 24–36)
aPTT: 131 s — ABNORMAL HIGH (ref 24–36)

## 2023-07-11 MED ORDER — CARVEDILOL 25 MG PO TABS
25.0000 mg | ORAL_TABLET | Freq: Two times a day (BID) | ORAL | Status: DC
  Administered 2023-07-11 (×3): 25 mg via ORAL
  Filled 2023-07-11 (×2): qty 4
  Filled 2023-07-11: qty 1

## 2023-07-11 MED ORDER — SACUBITRIL-VALSARTAN 49-51 MG PO TABS
1.0000 | ORAL_TABLET | Freq: Two times a day (BID) | ORAL | Status: DC
  Administered 2023-07-12 – 2023-07-16 (×9): 1 via ORAL
  Filled 2023-07-11 (×9): qty 1

## 2023-07-11 MED ORDER — AMLODIPINE BESYLATE 5 MG PO TABS
10.0000 mg | ORAL_TABLET | Freq: Every day | ORAL | Status: DC
  Administered 2023-07-11: 10 mg via ORAL
  Filled 2023-07-11: qty 2

## 2023-07-11 MED ORDER — POTASSIUM CHLORIDE CRYS ER 20 MEQ PO TBCR
40.0000 meq | EXTENDED_RELEASE_TABLET | Freq: Once | ORAL | Status: AC
  Administered 2023-07-11: 40 meq via ORAL
  Filled 2023-07-11: qty 2

## 2023-07-11 MED ORDER — IPRATROPIUM-ALBUTEROL 0.5-2.5 (3) MG/3ML IN SOLN: 3.0000 mL | RESPIRATORY_TRACT | Status: DC | PRN

## 2023-07-11 MED ORDER — SENNOSIDES-DOCUSATE SODIUM 8.6-50 MG PO TABS
1.0000 | ORAL_TABLET | Freq: Every evening | ORAL | Status: DC | PRN
  Administered 2023-07-18: 1 via ORAL
  Filled 2023-07-11: qty 1

## 2023-07-11 MED ORDER — ACETAMINOPHEN 650 MG RE SUPP
650.0000 mg | Freq: Four times a day (QID) | RECTAL | Status: DC | PRN
Start: 2023-07-11 — End: 2023-07-16

## 2023-07-11 MED ORDER — GUAIFENESIN 100 MG/5ML PO LIQD: 5.0000 mL | ORAL | Status: DC | PRN

## 2023-07-11 MED ORDER — SACUBITRIL-VALSARTAN 97-103 MG PO TABS
1.0000 | ORAL_TABLET | Freq: Two times a day (BID) | ORAL | Status: DC
  Administered 2023-07-11 (×2): 1 via ORAL
  Filled 2023-07-11 (×2): qty 1

## 2023-07-11 MED ORDER — OXYCODONE-ACETAMINOPHEN 5-325 MG PO TABS
1.0000 | ORAL_TABLET | ORAL | Status: DC | PRN
  Administered 2023-07-11 – 2023-07-28 (×11): 1 via ORAL
  Filled 2023-07-11 (×12): qty 1

## 2023-07-11 MED ORDER — CARVEDILOL 6.25 MG PO TABS: 25.0000 mg | ORAL_TABLET | Freq: Two times a day (BID) | ORAL | Status: DC

## 2023-07-11 MED ORDER — HYDRALAZINE HCL 20 MG/ML IJ SOLN
10.0000 mg | INTRAMUSCULAR | Status: DC | PRN
  Filled 2023-07-11: qty 1

## 2023-07-11 MED ORDER — FUROSEMIDE 10 MG/ML IJ SOLN
60.0000 mg | Freq: Two times a day (BID) | INTRAMUSCULAR | Status: DC
  Administered 2023-07-11: 60 mg via INTRAVENOUS
  Filled 2023-07-11: qty 8

## 2023-07-11 MED ORDER — GABAPENTIN 300 MG PO CAPS
300.0000 mg | ORAL_CAPSULE | Freq: Two times a day (BID) | ORAL | Status: DC
  Administered 2023-07-11 – 2023-07-31 (×40): 300 mg via ORAL
  Filled 2023-07-11 (×41): qty 1

## 2023-07-11 MED ORDER — FUROSEMIDE 10 MG/ML IJ SOLN
80.0000 mg | Freq: Two times a day (BID) | INTRAMUSCULAR | Status: DC
Start: 2023-07-11 — End: 2023-07-12
  Filled 2023-07-11 (×2): qty 8

## 2023-07-11 MED ORDER — CEPHALEXIN 500 MG PO CAPS
500.0000 mg | ORAL_CAPSULE | Freq: Three times a day (TID) | ORAL | Status: DC
  Administered 2023-07-11 – 2023-07-15 (×10): 500 mg via ORAL
  Filled 2023-07-11 (×12): qty 1

## 2023-07-11 MED ORDER — ONDANSETRON HCL 4 MG PO TABS: 4.0000 mg | ORAL_TABLET | Freq: Four times a day (QID) | ORAL | Status: DC | PRN

## 2023-07-11 MED ORDER — CARVEDILOL 6.25 MG PO TABS
6.2500 mg | ORAL_TABLET | Freq: Two times a day (BID) | ORAL | Status: DC
  Administered 2023-07-12 – 2023-07-31 (×37): 6.25 mg via ORAL
  Filled 2023-07-11 (×38): qty 1

## 2023-07-11 MED ORDER — HEPARIN (PORCINE) 25000 UT/250ML-% IV SOLN
1200.0000 [IU]/h | INTRAVENOUS | Status: DC
  Administered 2023-07-11: 1050 [IU]/h via INTRAVENOUS
  Administered 2023-07-14: 1300 [IU]/h via INTRAVENOUS
  Administered 2023-07-15: 1200 [IU]/h via INTRAVENOUS
  Filled 2023-07-11 (×4): qty 250

## 2023-07-11 MED ORDER — SPIRONOLACTONE 25 MG PO TABS
25.0000 mg | ORAL_TABLET | Freq: Every day | ORAL | Status: DC
  Administered 2023-07-11 – 2023-07-16 (×6): 25 mg via ORAL
  Filled 2023-07-11 (×6): qty 1

## 2023-07-11 MED ORDER — ENOXAPARIN SODIUM 40 MG/0.4ML IJ SOSY: 40.0000 mg | PREFILLED_SYRINGE | INTRAMUSCULAR | Status: DC

## 2023-07-11 MED ORDER — MAGNESIUM HYDROXIDE 400 MG/5ML PO SUSP
30.0000 mL | Freq: Every day | ORAL | Status: DC | PRN
  Administered 2023-07-23: 30 mL via ORAL
  Filled 2023-07-11: qty 30

## 2023-07-11 MED ORDER — TRAZODONE HCL 50 MG PO TABS
25.0000 mg | ORAL_TABLET | Freq: Every evening | ORAL | Status: DC | PRN
  Administered 2023-07-12 – 2023-07-30 (×14): 25 mg via ORAL
  Filled 2023-07-11 (×14): qty 1

## 2023-07-11 MED ORDER — HEPARIN BOLUS VIA INFUSION
4000.0000 [IU] | Freq: Once | INTRAVENOUS | Status: AC
  Administered 2023-07-11: 4000 [IU] via INTRAVENOUS
  Filled 2023-07-11: qty 4000

## 2023-07-11 MED ORDER — PRAVASTATIN SODIUM 20 MG PO TABS
20.0000 mg | ORAL_TABLET | Freq: Every day | ORAL | Status: DC
  Administered 2023-07-11 – 2023-07-30 (×20): 20 mg via ORAL
  Filled 2023-07-11 (×19): qty 1

## 2023-07-11 MED ORDER — ACETAMINOPHEN 325 MG PO TABS
650.0000 mg | ORAL_TABLET | Freq: Four times a day (QID) | ORAL | Status: DC | PRN
Start: 2023-07-11 — End: 2023-07-16

## 2023-07-11 MED ORDER — ONDANSETRON HCL 4 MG/2ML IJ SOLN: 4.0000 mg | Freq: Four times a day (QID) | INTRAMUSCULAR | Status: DC | PRN

## 2023-07-11 NOTE — H&P (Addendum)
[place]   PATIENT NAME: Kevin [name]    MR#:  [id number]  DATE OF BIRTH:  1956-06-14  DATE OF ADMISSION:  07/10/2023  PRIMARY CARE PHYSICIAN: Pcp, No   Patient is coming from: Home  REQUESTING/REFERRING PHYSICIAN: Miki Kins, MD  CHIEF COMPLAINT:   Chief Complaint  Patient presents with   Leg Swelling    HISTORY OF PRESENT ILLNESS:  Jayven Boersma is a 67 y.o. male with medical history significant for chronic combined systolic diastolic CHF, COPD, type 2 diabetes mellitus and hypertension, who presented to the emergency room with acute onset of worsening dyspnea with associated significant lower extremity edema and dyspnea on exertion.  He admits to paroxysmal nocturnal dyspnea as well as orthopnea.  He denied any fever or chills.  No cough however has been having occasional wheezing.  He ran out of his diuretic therapy recently.  No chest pain or palpitations.  No dysuria, oliguria or hematuria or flank pain.  No nausea or vomiting or abdominal pain.  ED Course: When he was in the ER vital signs revealed a blood pressure of 143/109 with heart rate of 129.  Labs revealed a high sensitive troponin I 114 and later 125 I and BNP of 2153.9.  CBC was unremarkable and CMP EKG as reviewed by me :showed sinus tachycardia with rate 128 with left axis deviation and LVH Imaging: Two-view chest x-ray showed cardiomegaly with no acute cardiopulmonary disease.  The patient was given 40 mg of IV Lasix, 4 mg of IV morphine sulfate and 4 mg of IV Zofran.  He will be admitted to a cardiac telemetry bed for further evaluation and management. PAST MEDICAL HISTORY:   Past Medical History:  Diagnosis Date   Chronic combined systolic (congestive) and diastolic (congestive) heart failure (HCC)    a. 2019 reported neg stress test and nl cors on cath in [facility]; b. 07/2019 Echo: EF 25-30%, Gr2 DD, glob HK. Mod reduced RV fxn w/ volume overload. Mod dil RA. Mild to mod TR. Mod elev PASP.    COPD (chronic obstructive pulmonary disease) (HCC)    Diabetes mellitus without complication (HCC)    Hypertension    Noncompliance    Pleural effusion on right    a. 07/2019 Thoracentesis: 300 ml.    PAST SURGICAL HISTORY:  History reviewed. No pertinent surgical history.  SOCIAL HISTORY:   Social History   Tobacco Use   Smoking status: Some Days    Current packs/day: 0.25    Average packs/day: 0.3 packs/day for 20.0 years (5.0 ttl pk-yrs)    Types: Cigarettes   Smokeless tobacco: Never   Tobacco comments:    a pack lasts a week (appx. 4 days)  Substance Use Topics   Alcohol use: Never    FAMILY HISTORY:  History reviewed. No pertinent family history.  DRUG ALLERGIES:  No Known Allergies  REVIEW OF SYSTEMS:   ROS As per history of present illness. All pertinent systems were reviewed above. Constitutional, HEENT, cardiovascular, respiratory, GI, GU, musculoskeletal, neuro, psychiatric, endocrine, integumentary and hematologic systems were reviewed and are otherwise negative/unremarkable except for positive findings mentioned above in the HPI.   MEDICATIONS AT HOME:   Prior to Admission medications   Medication Sig Start Date End Date Taking? Authorizing Provider  albuterol (VENTOLIN HFA) 108 (90 Base) MCG/ACT inhaler Inhale 2 puffs into the lungs every 6 (six) hours as needed for wheezing or shortness of breath. 09/08/19  Yes Delma Freeze, FNP  amLODipine (NORVASC) 10 MG tablet TAKE ONE TABLET BY MOUTH EVERY DAY 02/28/23 02/28/24 Yes Willy Eddy, MD  carvedilol (COREG) 25 MG tablet TAKE ONE TABLET BY MOUTH 2 TIMES A DAY 02/28/23 02/28/24 Yes Willy Eddy, MD  furosemide (LASIX) 40 MG tablet Take 1 tablet (40 mg total) by mouth daily. 05/01/22  Yes Hackney, Inetta Fermo A, FNP  oxyCODONE-acetaminophen (PERCOCET) 5-325 MG tablet Take 1 tablet by mouth every 4 (four) hours as needed for severe pain. 02/28/23 02/28/24 Yes Willy Eddy, MD  sacubitril-valsartan (ENTRESTO)  97-103 MG Take 1 tablet by mouth 2 (two) times daily. 02/28/23  Yes Willy Eddy, MD  spironolactone (ALDACTONE) 25 MG tablet TAKE ONE TABLET BY MOUTH EVERY DAY 05/01/22 07/11/23 Yes Clarisa [name] A, FNP  Blood Pressure KIT 1 kit by Does not apply route daily. 12/23/19   Iloabachie, Chioma E, NP  gabapentin (NEURONTIN) 300 MG capsule Take 300 mg by mouth 2 (two) times daily.    [provider]  pravastatin (PRAVACHOL) 20 MG tablet TAKE ONE TABLET BY MOUTH EVERY DAY 02/28/23 02/28/24  Willy Eddy, MD  tiotropium (SPIRIVA) 18 MCG inhalation capsule Place 1 capsule (18 mcg total) into inhaler and inhale daily. Patient not taking: Reported on 05/01/2022 12/23/19   Iloabachie, Chioma E, NP      VITAL SIGNS:  Blood pressure 113/88, pulse 85, temperature 98 F (36.7 C), temperature source Oral, resp. rate 15, weight 78.9 kg, SpO2 100%.  PHYSICAL EXAMINATION:  Physical Exam  GENERAL:  67 y.o.-year-old patient lying in the bed with no acute distress however with mild conversational dyspnea.  EYES: Pupils equal, round, reactive to light and accommodation. No scleral icterus. Extraocular muscles intact.  HEENT: Head atraumatic, normocephalic. Oropharynx and nasopharynx clear.  NECK:  Supple, no jugular venous distention. No thyroid enlargement, no tenderness.  LUNGS: Diminished bibasal breath sounds with bibasal rales.. No use of accessory muscles of respiration.  CARDIOVASCULAR: Regular rate and rhythm, S1, S2 normal. No murmurs, rubs, or gallops.  ABDOMEN: Soft, nondistended, nontender. Bowel sounds present. No organomegaly or mass.  EXTREMITIES: 3+ bilateral lower extremity pitting edema with no cyanosis, or clubbing.  NEUROLOGIC: Cranial nerves II through XII are intact. Muscle strength 5/5 in all extremities. Sensation intact. Gait not checked.  PSYCHIATRIC: The patient is alert and oriented x 3.  Normal affect and good eye contact. SKIN: Minor ulceration of the right foot dorsum due to  significant edema.  LABORATORY PANEL:   CBC Recent Labs  Lab 07/11/23 0518  WBC 10.3  HGB 12.4*  HCT 37.7*  PLT 210   ------------------------------------------------------------------------------------------------------------------  Chemistries  Recent Labs  Lab 07/10/23 1659  NA 137  K 3.4*  CL 99  CO2 29  GLUCOSE 92  BUN 33*  CREATININE 1.23  CALCIUM 8.9  AST 190*  ALT 287*  ALKPHOS 242*  BILITOT 4.6*   ------------------------------------------------------------------------------------------------------------------  Cardiac Enzymes No results for input(s): "TROPONINI" in the last 168 hours. ------------------------------------------------------------------------------------------------------------------  RADIOLOGY:  DG Chest 2 View  Result Date: 07/10/2023 CLINICAL DATA:  Bilateral feet swelling for the past couple weeks. EXAM: CHEST - 2 VIEW COMPARISON:  Radiograph 06/12/2020 FINDINGS: Stable cardiomegaly. Aortic atherosclerotic calcification. No focal consolidation, pleural effusion, or pneumothorax. No displaced rib fractures. IMPRESSION: No acute cardiopulmonary disease.  Cardiomegaly. Electronically Signed   By: Minerva Fester M.D.   On: 07/10/2023 23:27      IMPRESSION AND PLAN:  Assessment and Plan: * Acute on chronic combined systolic and diastolic CHF (congestive heart failure) (HCC)  -The  patient will be admitted to a cardiac telemetry bed. - We will continue diuresis with IV Lasix. - We will continue Coreg and Aldactone as well as Entresto. - We Will follow serial troponins. - We will follow I's and O's and daily weights. - Cardiology consult will be obtained. - I notified Dr. Duke Salvia about the patient.   Hypokalemia - Potassium replaced and magnesium level will be checked.  Dyslipidemia - We will continue statin therapy.  Peripheral neuropathy - We will continue Neurontin.  Essential hypertension - We will continue antihypertensive  therapy.       DVT prophylaxis: Lovenox.  Advanced Care Planning:  Code Status: full code.  Family Communication:  The plan of care was discussed in details with the patient (and family). I answered all questions. The patient agreed to proceed with the above mentioned plan. Further management will depend upon hospital course. Disposition Plan: Back to previous home environment Consults called: Cardiology All the records are reviewed and case discussed with ED provider.  Status is: Inpatient    At the time of the admission, it appears that the appropriate admission status for this patient is inpatient.  This is judged to be reasonable and necessary in order to provide the required intensity of service to ensure the patient's safety given the presenting symptoms, physical exam findings and initial radiographic and laboratory data in the context of comorbid conditions.  The patient requires inpatient status due to high intensity of service, high risk of further deterioration and high frequency of surveillance required.  I certify that at the time of admission, it is my clinical judgment that the patient will require inpatient hospital care extending more than 2 midnights.                            Dispo: The patient is from: Home              Anticipated d/c is to: Home              Patient currently is not medically stable to d/c.              Difficult to place patient: No  Hannah Beat M.D on 07/11/2023 at 6:36 AM  [facility]   From 7 PM-7 AM, contact night-coverage [email or web address]  CC: Primary care physician; Pcp, No

## 2023-07-11 NOTE — Hospital Course (Addendum)
   Brief Narrative:   67 year old with CHF with reduced EF, COPD, DM 2, HTN comes to the ED with worsening dyspnea on exertion and lower extremity swelling.  Patient was found to be in CHF exacerbation started on IV diuretics.  Assessment & Plan:  Principal Problem:   Acute on chronic combined systolic and diastolic CHF (congestive heart failure) (HCC) Active Problems:   Hypokalemia   Essential hypertension   Peripheral neuropathy   Dyslipidemia    Acute on chronic combined systolic and diastolic CHF (congestive heart failure) (HCC), EF 45% Bilateral lower extremity swelling with some blistering I am concerned about medication noncompliance.  Echocardiogram in the past had shown EF of 45%, repeat echo pending - Continue diuretics.  GDMT per cardiology- Adv CHF team to follow on Monday Empiric Keflex for 5 days for possible LE cellulitis.   Atrial flutter with RVR - TSH normal, on heparin drip.  Continue Coreg - May require cardioversion if he does not get back to normal sinus rhythm.    Hypokalemia Repleted as needed   Dyslipidemia -Pravastatin  COPD with tobacco use -As needed bronchodilators    Peripheral neuropathy -Gabapentin   Essential hypertension -Ongoing cardiac medication adjustments  Declining prescription medications.  Discussed with cardiology.  Possible underlying psych issues, psychiatry consulted.   DVT prophylaxis: Heparin drip Code Status: Full code code Family Communication:   Status is: Inpatient Continue hospital stay for IV diuretics    Subjective: Patient seen at bedside.  Refusing all of his medication.  He has covered his head with his bedsheet and refuses to take it off to speak with me.  Denies any complaints.  Examination:  General exam: Appears calm and comfortable  Respiratory system: Clear to auscultation. Respiratory effort normal. Cardiovascular system: S1 & S2 heard, RRR. No JVD, murmurs, rubs, gallops or clicks.  3+  bilateral lower extremity pitting edema Gastrointestinal system: Abdomen is nondistended, soft and nontender. No organomegaly or masses felt. Normal bowel sounds heard. Central nervous system: Alert and oriented. No focal neurological deficits. Extremities: Symmetric 5 x 5 power. Skin: No rashes, lesions or ulcers Psychiatry: Judgement and insight appear poor

## 2023-07-11 NOTE — ED Notes (Signed)
Per pharamcy redraw coag 6hrs from initial lab draw @ 0900 continue heparing gtt @ 1050units/hr pt remains aox4 denies needs @ this time denies CP denies sob provided pilliows for comfort pt has utilized urinal x4 with approx yellow urine output for approx output total

## 2023-07-11 NOTE — Assessment & Plan Note (Signed)
-  The patient will be admitted to a cardiac telemetry bed. - We will continue diuresis with IV Lasix. - We will continue Coreg and Aldactone as well as Entresto. - We Will follow serial troponins. - We will follow I's and O's and daily weights. - Cardiology consult will be obtained. - I notified Dr. Duke Salvia about the patient.

## 2023-07-11 NOTE — Assessment & Plan Note (Signed)
-   Potassium replaced and magnesium level will be checked.

## 2023-07-11 NOTE — Consult Note (Signed)
PHARMACY CONSULT NOTE - ELECTROLYTES  Pharmacy Consult for Electrolyte Monitoring and Replacement   Recent Labs: Weight: 78.9 kg (174 lb) Estimated Creatinine Clearance: 64.5 mL/min (by C-G formula based on SCr of 1.09 mg/dL). Potassium (mmol/L)  Date Value  07/11/2023 3.1 (L)   Magnesium (mg/dL)  Date Value  52/84/1324 1.9   Calcium (mg/dL)  Date Value  40/05/2724 8.1 (L)   Albumin (g/dL)  Date Value  36/64/4034 3.7  10/04/2020 4.5   Sodium (mmol/L)  Date Value  07/11/2023 140  10/04/2020 141    Assessment  Kevin [name] is a 67 y.o. male presenting with acute onchronic heart failure and atrial flutter. PMH significant for HFimpEF, HTN, DM, COPD, and tobacco use. Pharmacy has been consulted to monitor and replace electrolytes. Patient's renal function currently stable.  Diet: Cardiac MIVF: N/A Pertinent medications: Entresto 97/103mg  BID, Spironolactone 25mg  daily, Furosemide 80mg  IV q12H  Goal of Therapy: Electrolytes WNL  Plan:  K 3.1 >> potassium chloride 40 mEq x 1 ordered by cardiology New start spironolactone and questionable adherence to [facility] has me recommending against additional potassium at this time No other electrolyte replacement currently warranted Check BMP, Mg, Phos with AM labs  Thank you for allowing pharmacy to be a part of this patient's care.  Will M. Dareen Piano, PharmD Clinical Pharmacist 07/11/2023 10:41 AM

## 2023-07-11 NOTE — Progress Notes (Signed)
ANTICOAGULATION CONSULT NOTE  Pharmacy Consult for heparin infusion Indication: ACS/STEMI  No Known Allergies  Patient Measurements: Height: 5\' 8"  (172.7 cm) Weight: 78.9 kg (174 lb) IBW/kg (Calculated) : 68.4 Heparin Dosing Weight: 78.9 kg  Vital Signs: Temp: 97.8 F (36.6 C) (11/15 1532) Temp Source: Oral (11/15 0730) BP: 102/85 (11/15 1532) Pulse Rate: 86 (11/15 1532)  Labs: Recent Labs    07/10/23 1659 07/10/23 2230 07/11/23 0043 07/11/23 0323 07/11/23 0518 07/11/23 0838 07/11/23 1530  HGB 14.3  --   --   --  12.4*  --   --   HCT 44.0  --   --   --  37.7*  --   --   PLT 235  --   --   --  210  --   --   APTT  --   --   --  >200*  --  131*  --   LABPROT  --   --   --  18.9*  --   --   --   INR  --   --   --  1.6*  --   --   --   HEPARINUNFRC  --   --   --   --   --  0.40 0.30  CREATININE 1.23  --   --   --  1.09  --   --   TROPONINIHS  --  114* 125*  --   --   --   --     Estimated Creatinine Clearance: 64.5 mL/min (by C-G formula based on SCr of 1.09 mg/dL).   Medical History: Past Medical History:  Diagnosis Date   COPD (chronic obstructive pulmonary disease) (HCC)    Diabetes mellitus without complication (HCC)    Heart failure with mid-range ejection fraction (HCC)    a. 2019 reported neg stress test and nl cors on cath in [facility]; b. 07/2019 Echo: EF 25-30%, Gr2 DD, glob HK. Mod reduced RV fxn w/ volume overload. Mod dil RA. Mild to mod TR. Mod elev PASP; b. 12/2019 Echo: EF 50-55%; c. 05/2020 Echo: EF 45-50%, glob HK, sev LVH, GrII DD, nl RV size/fxn, mildly BAE, mild AoV sclerosis, mildly dilated Ao root.   Hypertension    NICM (nonischemic cardiomyopathy) (HCC)    Noncompliance    Pleural effusion on right    a. 07/2019 Thoracentesis: 300 ml.   Tobacco abuse     Assessment: Pt is a 67 yo male presenting to ED c/o bilateral lower extremity swelling, found with elevated BNP and Troponin I level, trending up. Pharmacy has been consulted to manage  heparin continuous infusion. Patient not on chronic anticoagulation prior to admission, per chart review.  Goal of Therapy:  Heparin level 0.3-0.7 units/ml Monitor platelets by anticoagulation protocol: Yes  Monitoring: 11/15 0838 HL 0.40 Therapeutic x 1 11/15 1530 HL 0.30 Therapeutic x 2 (barely)    Plan:  11/15 1530 HL 0.30    Therapeutic x 2 (barely) Will slightly increase heparin infusion to 1100 units/hour. Check HL with am labs Continue to monitor CBC daily while on heparin infusion.  Bari Mantis PharmD Clinical Pharmacist 07/11/2023

## 2023-07-11 NOTE — Evaluation (Signed)
Physical Therapy Evaluation Patient Details Name: Kevin [name] MRN: [id number] DOB: 10/19/1955 Today's Date: 07/11/2023  History of Present Illness  Pt is 67 y/o admitted 07/10/23 for Acute on chronic combined systolic and diastolic CHF. PmHx includes: HTN, peripheral neuropathy, dyslipidemia, and COPD.   Clinical Impression  Pt received in bed with RN at bedside and agreed to PT session. 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 To get from one place to another, pt amb by foot without the use of AD, however has recently noticed increased fatigue when amb the usual distances. Pt performed bed mobility to sit EOB SUP, STS with the use of RW (2wheels) CGA, and amb by EOB with RW CGA. VC were necessary for RW management and increased time with transfers were required for pt to process the sequence needed to perform movement. Vitals throughout sessions remained appropriate. Pt tolerated Tx well and will continue to benefit from skilled PT sessions to improve endurance, functional mobility, and activity tolerance to maximize safety/return to PLOF following D/C.      If plan is discharge home, recommend the following: A little help with walking and/or transfers   Can travel by private vehicle        Equipment Recommendations Rolling walker (2 wheels)  Recommendations for Other Services       Functional Status Assessment Patient has had a recent decline in their functional status and demonstrates the ability to make significant improvements in function in a reasonable and predictable amount of time.     Precautions / Restrictions Precautions Precautions: Fall Restrictions Weight Bearing Restrictions: No      Mobility  Bed Mobility Overal bed mobility: Needs Assistance Bed Mobility: Supine to Sit, Sit to Supine     Supine to sit: Supervision Sit to supine: Supervision   General bed mobility comments: Pt performed bed mobility SUP and did not report any  s/sx relative to dizziness.    Transfers Overall transfer level: Needs assistance Equipment used: Rolling walker (2 wheels) Transfers: Sit to/from Stand Sit to Stand: Contact guard assist           General transfer comment: Pt performed 2xSTS with the use of RW (2wheels) CGA. Pt needed increased time to perform activity to prepare themselves for sequencing the movement.    Ambulation/Gait Ambulation/Gait assistance: Contact guard assist Gait Distance (Feet): 7 Feet Assistive device: Rolling walker (2 wheels) Gait Pattern/deviations: Step-to pattern Gait velocity: decreased     General Gait Details: Pt amb in room by EOB with the use of RW (2wheels) CGA. Pt amb ~56ft and deferred futher mobility due to fatigue. VC necessary for RW management. Pt did not report any s/sx relative to dizziness.  Stairs            Wheelchair Mobility     Tilt Bed    Modified Rankin (Stroke Patients Only)       Balance Overall balance assessment: Needs assistance Sitting-balance support: Feet supported Sitting balance-Leahy Scale: Good     Standing balance support: During functional activity, Bilateral upper extremity supported Standing balance-Leahy Scale: Fair                               Pertinent Vitals/Pain Pain Assessment Pain Assessment: No/denies pain    Home Living Family/patient expects to be discharged to:: Private residence Living Arrangements: Parent Available Help at Discharge: Family;Available 24 hours/day Type of Home: Apartment Home  Access: Level entry       Home Layout: One level Home Equipment: Rollator (4 wheels);Cane - quad;Shower seat;Grab bars - tub/shower;Hand held shower head Additional Comments: 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    Prior Function Prior Level of Function : Independent/Modified Independent             Mobility Comments: Pt reports IND prior to admission. Pt does not walk so would  walk to wherever they need to go. For ex., usually pt will amb 2-60mi for a store run. ADLs Comments: Pt reports IND with ADLs prior to admission.     Extremity/Trunk Assessment   Upper Extremity Assessment Upper Extremity Assessment: Overall WFL for tasks assessed    Lower Extremity Assessment Lower Extremity Assessment: Generalized weakness       Communication   Communication Communication: No apparent difficulties Cueing Techniques: Verbal cues  Cognition Arousal: Alert Behavior During Therapy: WFL for tasks assessed/performed Overall Cognitive Status: Within Functional Limits for tasks assessed                                 General Comments: Pt pleasant and willing to participate in PT session.        General Comments      Exercises     Assessment/Plan    PT Assessment Patient needs continued PT services  PT Problem List Decreased strength;Decreased activity tolerance;Decreased mobility;Decreased knowledge of use of DME       PT Treatment Interventions DME instruction;Gait training;Therapeutic activities    PT Goals (Current goals can be found in the Care Plan section)  Acute Rehab PT Goals Patient Stated Goal: Goal not stated PT Goal Formulation: With patient Time For Goal Achievement: 07/25/23 Potential to Achieve Goals: Good    Frequency Min 1X/week     Co-evaluation               AM-PAC PT "6 Clicks" Mobility  Outcome Measure Help needed turning from your back to your side while in a flat bed without using bedrails?: A Little Help needed moving from lying on your back to sitting on the side of a flat bed without using bedrails?: A Little Help needed moving to and from a bed to a chair (including a wheelchair)?: A Little Help needed standing up from a chair using your arms (e.g., wheelchair or bedside chair)?: A Little Help needed to walk in hospital room?: A Little Help needed climbing 3-5 steps with a railing? : A Lot 6  Click Score: 17    End of Session Equipment Utilized During Treatment: Gait belt Activity Tolerance: Patient tolerated treatment well Patient left: in bed;with call bell/phone within reach Nurse Communication: Mobility status PT Visit Diagnosis: Other abnormalities of gait and mobility (R26.89);Muscle weakness (generalized) (M62.81);Difficulty in walking, not elsewhere classified (R26.2)    Time: 4010-2725 PT Time Calculation (min) (ACUTE ONLY): 22 min   Charges:   PT Evaluation $PT Eval Low Complexity: 1 Low PT Treatments $Therapeutic Activity: 8-22 mins PT General Charges $$ ACUTE PT VISIT: 1 Visit         Malcolm Hetz [place] SPT, LAT, ATC  Angeleen Horney [place] 07/11/2023, 2:53 PM

## 2023-07-11 NOTE — TOC Benefit Eligibility Note (Signed)
Patient Product/process development scientist completed.    The patient is insured through [employer]. Patient has Medicare and is not eligible for a copay card, but may be able to apply for patient assistance, if available.    Ran test claim for Entresto 24-26 mg and the current 30 day co-pay is $0.00.  Ran test claim for Farxiga 10 mg and the current 30 day co-pay is $0.00.  Ran test claim for Jardiance 10 mg and the current 30 day co-pay is $0.00.   This test claim was processed through [facility]- copay amounts may vary at other pharmacies due to pharmacy/plan contracts, or as the patient moves through the different stages of their insurance plan.     Roland Earl, CPHT Pharmacy Technician III Certified Patient Advocate [facility] [place] [facility] Patient Advocate Team Direct Number: [phone]  Fax: [phone]

## 2023-07-11 NOTE — Progress Notes (Signed)
ANTICOAGULATION CONSULT NOTE  Pharmacy Consult for heparin infusion Indication: ACS/STEMI  No Known Allergies  Patient Measurements: Weight: 78.9 kg (174 lb) Heparin Dosing Weight: 78.9 kg  Vital Signs: Temp: 97.5 F (36.4 C) (11/15 0730) Temp Source: Oral (11/15 0730) BP: 116/88 (11/15 0730) Pulse Rate: 84 (11/15 0730)  Labs: Recent Labs    07/10/23 1659 07/10/23 2230 07/11/23 0043 07/11/23 0323 07/11/23 0518 07/11/23 0838  HGB 14.3  --   --   --  12.4*  --   HCT 44.0  --   --   --  37.7*  --   PLT 235  --   --   --  210  --   APTT  --   --   --  >200*  --  131*  LABPROT  --   --   --  18.9*  --   --   INR  --   --   --  1.6*  --   --   HEPARINUNFRC  --   --   --   --   --  0.40  CREATININE 1.23  --   --   --  1.09  --   TROPONINIHS  --  114* 125*  --   --   --     Estimated Creatinine Clearance: 64.5 mL/min (by C-G formula based on SCr of 1.09 mg/dL).   Medical History: Past Medical History:  Diagnosis Date   COPD (chronic obstructive pulmonary disease) (HCC)    Diabetes mellitus without complication (HCC)    Heart failure with mid-range ejection fraction (HCC)    a. 2019 reported neg stress test and nl cors on cath in [facility]; b. 07/2019 Echo: EF 25-30%, Gr2 DD, glob HK. Mod reduced RV fxn w/ volume overload. Mod dil RA. Mild to mod TR. Mod elev PASP; b. 12/2019 Echo: EF 50-55%; c. 05/2020 Echo: EF 45-50%, glob HK, sev LVH, GrII DD, nl RV size/fxn, mildly BAE, mild AoV sclerosis, mildly dilated Ao root.   Hypertension    NICM (nonischemic cardiomyopathy) (HCC)    Noncompliance    Pleural effusion on right    a. 07/2019 Thoracentesis: 300 ml.   Tobacco abuse     Assessment: Pt is a 67 yo male presenting to ED c/o bilateral lower extremity swelling, found with elevated BNP and Troponin I level, trending up. Pharmacy has been consulted to manage heparin continuous infusion. Patient not on chronic anticoagulation prior to admission, per chart review.  Goal  of Therapy:  Heparin level 0.3-0.7 units/ml Monitor platelets by anticoagulation protocol: Yes  Monitoring: 11/15 0838 HL 0.40 Therapeutic x 1    Plan:  Continue heparin infusion at 1050 units/hour. Check HL and/or aPTT in 6 hours. Continue to monitor CBC daily while on heparin infusion.  Will M. Dareen Piano, PharmD Clinical Pharmacist 07/11/2023 10:31 AM

## 2023-07-11 NOTE — Assessment & Plan Note (Signed)
-   We will continue Neurontin. ?

## 2023-07-11 NOTE — Progress Notes (Signed)
ANTICOAGULATION CONSULT NOTE  Pharmacy Consult for heparin infusion Indication: ACS/STEMI  No Known Allergies  Patient Measurements:   Heparin Dosing Weight: 78.9 kg  Vital Signs: Temp: 98 F (36.7 C) (11/14 2141) Temp Source: Oral (11/14 2141) BP: 158/122 (11/15 0045) Pulse Rate: 126 (11/15 0145)  Labs: Recent Labs    07/10/23 1659 07/10/23 2230 07/11/23 0043  HGB 14.3  --   --   HCT 44.0  --   --   PLT 235  --   --   CREATININE 1.23  --   --   TROPONINIHS  --  114* 125*    CrCl cannot be calculated (Unknown ideal weight.).   Medical History: Past Medical History:  Diagnosis Date   Chronic combined systolic (congestive) and diastolic (congestive) heart failure (HCC)    a. 2019 reported neg stress test and nl cors on cath in [facility]; b. 07/2019 Echo: EF 25-30%, Gr2 DD, glob HK. Mod reduced RV fxn w/ volume overload. Mod dil RA. Mild to mod TR. Mod elev PASP.   COPD (chronic obstructive pulmonary disease) (HCC)    Diabetes mellitus without complication (HCC)    Hypertension    Noncompliance    Pleural effusion on right    a. 07/2019 Thoracentesis: 300 ml.    Assessment: Pt is a 67 yo male presenting to ED c/o bilateral lower extremity swelling, found with elevated BNP and Troponin I level, trending up.  Goal of Therapy:  Heparin level 0.3-0.7 units/ml Monitor platelets by anticoagulation protocol: Yes   Plan:  Bolus 4000 units x 1 Start heparin infusion at 1050 units/hr Will check HL in 6 hr after start of infusion CBC daily while on heparin  Otelia Sergeant, PharmD, [facility] 07/11/2023 2:45 AM

## 2023-07-11 NOTE — ED Notes (Signed)
Dr Joylene Igo order for heparin per pharmacy in secure chat pharmacy notified

## 2023-07-11 NOTE — Assessment & Plan Note (Signed)
-   We will continue statin therapy. 

## 2023-07-11 NOTE — Plan of Care (Signed)

## 2023-07-11 NOTE — ED Notes (Signed)
Secure chat sent to attending regarding elevated trop

## 2023-07-11 NOTE — Evaluation (Signed)
Occupational Therapy Evaluation Patient Details Name: Kevin [name] MRN: [id number] DOB: Aug 30, 1955 Today's Date: 07/11/2023   History of Present Illness Pt is 67 y/o admitted 07/10/23 for Acute on chronic combined systolic and diastolic CHF and a-flutter. PMHx includes: HTN, peripheral neuropathy, dyslipidemia, and COPD.   Clinical Impression   Pt was seen for OT evaluation this date. Prior to hospital admission, pt lives in an apartment with his mother and serves as her primary caregiver. He was IND with ADLs, IADLs and mobility using a walking stick at times.  Pt presents to acute OT demonstrating impaired ADL performance and functional mobility 2/2 decreased activity tolerance, balance deficits and weakness (See OT problem list for additional functional deficits). Pt currently requires SUP for bed mobility. CGA via HHA for STS from EOB and ~6 steps total forward and backward at EOB. He needed SUP and increased time to doff/don socks for LB dressing. Good seated balance. RR 27 at most, HR in 90's with activity.  Pt would benefit from skilled OT services to address noted impairments and functional limitations (see below for any additional details) in order to maximize safety and independence while minimizing falls risk and caregiver burden. Do anticipate the need for follow up OT services upon acute hospital DC.       If plan is discharge home, recommend the following: A little help with walking and/or transfers;A little help with bathing/dressing/bathroom;Help with stairs or ramp for entrance;Assist for transportation;Assistance with cooking/housework    Functional Status Assessment  Patient has had a recent decline in their functional status and demonstrates the ability to make significant improvements in function in a reasonable and predictable amount of time.  Equipment Recommendations  Other (comment);Tub/shower seat (cane or RW)    Recommendations for Other Services        Precautions / Restrictions Precautions Precautions: Fall Restrictions Weight Bearing Restrictions: No      Mobility Bed Mobility Overal bed mobility: Needs Assistance Bed Mobility: Supine to Sit, Sit to Supine     Supine to sit: Supervision Sit to supine: Supervision        Transfers Overall transfer level: Needs assistance Equipment used: 1 person hand held assist Transfers: Sit to/from Stand Sit to Stand: Contact guard assist           General transfer comment: HHA x1 for STS from EOB and able to take ~3 forward steps and 3 backwards to bed. Reports use of walking stick and would benefit from cane or RW for stability      Balance Overall balance assessment: Needs assistance Sitting-balance support: Feet supported Sitting balance-Leahy Scale: Good Sitting balance - Comments: steady at EOB for LB dressing   Standing balance support: During functional activity, Single extremity supported Standing balance-Leahy Scale: Fair Standing balance comment: would benefit from cane or RW for dynamic balance                           ADL either performed or assessed with clinical judgement   ADL Overall ADL's : Needs assistance/impaired                     Lower Body Dressing: Supervision/safety Lower Body Dressing Details (indicate cue type and reason): increased time needed to doff and don R sock seated at EOB Toilet Transfer: Contact guard assist Toilet Transfer Details (indicate cue type and reason): simulated from EOB on high gurney in ED  Vision         Perception         Praxis         Pertinent Vitals/Pain Pain Assessment Pain Assessment: No/denies pain     Extremity/Trunk Assessment Upper Extremity Assessment Upper Extremity Assessment: Overall WFL for tasks assessed   Lower Extremity Assessment Lower Extremity Assessment: Generalized weakness       Communication Communication Communication: No  apparent difficulties Cueing Techniques: Verbal cues   Cognition Arousal: Alert Behavior During Therapy: WFL for tasks assessed/performed Overall Cognitive Status: Within Functional Limits for tasks assessed                                 General Comments: Pt pleasant and willing to participate in OT session, talkative and at times 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      Prior Functioning/Environment Prior Level of Function : Independent/Modified Independent             Mobility Comments: Pt reports IND prior to admission, but mentions use of walking stick.  Pt does not drive so would walk to wherever they need to go. For ex., usually pt will amb 2-75mi for a store run. ADLs Comments: Pt reports IND with ADLs prior to admission.        OT Problem List: Decreased strength;Decreased activity tolerance;Decreased safety awareness;Increased edema;Impaired balance (sitting and/or standing)      OT Treatment/Interventions: Self-care/ADL training;Therapeutic exercise;Therapeutic activities;Energy conservation;DME and/or AE instruction;Patient/family education;Balance training    OT Goals(Current goals can be found in the care plan section) Acute Rehab OT Goals Patient Stated Goal:  return home OT Goal Formulation: With patient Time For Goal Achievement: 07/25/23 Potential to Achieve Goals: Good ADL Goals Pt Will Perform Lower Body Bathing: with modified independence;sit to/from stand;sitting/lateral leans Pt Will Perform Lower Body Dressing: with modified independence;sitting/lateral leans;sit to/from stand Pt Will Transfer to Toilet: regular height toilet;ambulating;with supervision Pt Will Perform Toileting - Clothing Manipulation and hygiene: with modified independence;sitting/lateral leans;sit to/from stand Additional ADL Goal #1: Pt will demo/verbalize 1 learned ECS and PLB during ADL performance to prevent overexertion and maximize IND.  OT Frequency: Min 1X/week    Co-evaluation              AM-PAC OT "6 Clicks" Daily Activity     Outcome Measure Help from another person eating meals?: None Help from another person taking care of personal grooming?: None Help from another person toileting, which includes using toliet, bedpan, or urinal?: A Little Help from another person bathing (including washing, rinsing, drying)?: A Little Help from another person to put on and taking off regular upper body clothing?: None Help from another person to put on and taking  off regular lower body clothing?: A Little 6 Click Score: 21   End of Session Equipment Utilized During Treatment: Gait belt Nurse Communication: Mobility status  Activity Tolerance: Patient tolerated treatment well Patient left: in bed;with call bell/phone within reach  OT Visit Diagnosis: Other abnormalities of gait and mobility (R26.89);Unsteadiness on feet (R26.81)                Time: 1610-9604 OT Time Calculation (min): 23 min Charges:  OT General Charges $OT Visit: 1 Visit OT Evaluation $OT Eval Low Complexity: 1 Low OT Treatments $Self Care/Home Management : 8-22 mins Laynie Espy, OTR/L 07/11/23, 3:04 PM  Ajdin Macke E Duane Earnshaw 07/11/2023, 3:02 PM

## 2023-07-11 NOTE — ED Notes (Signed)
Rec'd call from CCMD about tachycardia.  Pt is in NAD at this time.  Was attempting to use urinal at that time.

## 2023-07-11 NOTE — Consult Note (Addendum)
Cardiology Consult    Patient ID: Kevin [name] MRN: [id number], DOB/AGE: [birth year]   Admit date: 07/10/2023 Date of Consult: 07/11/2023  Primary Physician: Kevin [name] Primary Cardiologist: Kevin Odea, MD Requesting Provider: A. Kevin Chimes, MD  Patient Profile    Kevin [name] is a 67 y.o. male with a history of NICM, HFimpEF, HTN, DM, tob abuse, and COPD, who is being seen today for the evaluation of acute on chronic CHF at the request of Dr. Nelson [name].  Past Medical History  Subjective  Past Medical History:  Diagnosis Date   COPD (chronic obstructive pulmonary disease) (HCC)    Diabetes mellitus without complication (HCC)    Heart failure with mid-range ejection fraction (HCC)    a. 2019 reported neg stress test and nl cors on cath in [facility]; b. 07/2019 Echo: EF 25-30%, Gr2 DD, glob HK. Mod reduced RV fxn w/ volume overload. Mod dil RA. Mild to mod TR. Mod elev PASP; b. 12/2019 Echo: EF 50-55%; c. 05/2020 Echo: EF 45-50%, glob HK, sev LVH, GrII DD, nl RV size/fxn, mildly BAE, mild AoV sclerosis, mildly dilated Ao root.   Hypertension    NICM (nonischemic cardiomyopathy) (HCC)    Noncompliance    Pleural effusion on right    a. 07/2019 Thoracentesis: 300 ml.   Tobacco abuse     History reviewed. No pertinent surgical history.   Allergies  No Known Allergies    History of Present Illness     67 y/o ? with the above past medical history including nonischemic cardiomyopathy, HFimpEF, hypertension, diabetes, tobacco abuse, and COPD.  He previously reported a h/o CHF with stress testing and cath in [place], sometime around 2019, which were reportedly normal. He moved to [facility] in 2020 and established care with [place] in the setting of hospitalization in 07/2019 for CHF. Echo during admission showed an EF of 25-30% with grade 2 diastolic dysfunction, global hypokinesis, moderate reduced RV function, moderately elevated PSAP, and mild to moderate TR. He was also noted to have a moderate size  right layering pleural effusion and underwent thoracentesis with removal of 300 mL during admission.  He was placed on GDMT and re-hospitalized in 05/2020 due to syncope.  F/u echo showed improvement in EF @ 45-50% w/ sev LVH, and GrII DD.  He has been followed intermittently and heart failure clinic and also by the community paramedic.   Patient continues to live with his mother in [place].  He says he takes care of her.  She is 87.  He does all the grocery shopping and typically has to walk to the grocery store, which is about 2 to 3 miles.  He notes that over the past several months, if not longer, he has had progressive worsening of dyspnea on exertion and now must take multiple breaks when walking to the grocery store.  He denies chest pain.  He is somewhat evasive when discussing his home medications, saying that he now has [facility] and has been taking medicines as prescribed.  Upon questioning about his home medicines, he thinks he is taking Entresto and maybe 5 or 6 other medicines but upon reviewing previously prescribed medications, he does not identify them as being familiar.   Mr. Kevin [name] says that on Tuesday, November 12, he awoke and suddenly noted lower extremity swelling that was not previously present.  This worsened over the subsequent 2 days, prompting him to present to the ED.  Here, he was afebrile, tachycardic @ 129, hypertensive @ 143/109.  ECG showed undetermined rhythm but likely atrial flutter based on subsequent telemetry evaluation,@ 128, LAD, LVH, IVCD, lateral TWI, baseline artifact.  BNP 2153.9. HsTrop 114  125.  K 3.4.  T bili 4.6, Alk phos 242, AST 190, ALT 287, INR 1.6.  CXR w/ cardiomegaly and w/o acute cardiopulm dzs.  He was given lasix 80mg  IV x 1, morphine 4mg  IV x 1, and placed on heparin, ? blocker, and entresto.  He currently denies any chest pain or dyspnea.  Inpatient Medications  Subjective    amLODipine  10 mg Oral Daily   carvedilol  25 mg Oral BID WC    furosemide  60 mg Intravenous Q12H   pravastatin  20 mg Oral q1800   sacubitril-valsartan  1 tablet Oral BID   spironolactone  25 mg Oral Daily    Family History    History reviewed. No pertinent family history. He indicated that his mother is alive. He indicated that his father is deceased.   Social History    Social History   Socioeconomic History   Marital status: Single    Spouse name: Not on file   Number of children: 2   Years of education: Not on file   Highest education level: High school graduate  Occupational History   Occupation: unemployed  Tobacco Use   Smoking status: Every Day    Current packs/day: 0.50    Average packs/day: 0.3 packs/day for 24.9 years (7.4 ttl pk-yrs)    Types: Cigarettes    Start date: 2020   Smokeless tobacco: Never   Tobacco comments:    a pack lasts a week (appx. 4 days)  Vaping Use   Vaping status: Never Used  Substance and Sexual Activity   Alcohol use: Never   Drug use: Not Currently   Sexual activity: Not Currently  Other Topics Concern   Not on file  Social History Narrative   Patient moved to [place] from [place] in March 2020, to take care of his Mother. She lives in a community managed by her church, "independent living for seniors." Patient lives with his Mother, does not drive, and reports he mostly keeps to himself.   Social Determinants of Health   Financial Resource Strain: High Risk (05/06/2022)   Overall Financial Resource Strain (CARDIA)    Difficulty of Paying Living Expenses: Hard  Food Insecurity: Food Insecurity Present (01/20/2020)   Hunger Vital Sign    Worried About Running Out of Food in the Last Year: Sometimes true    Ran Out of Food in the Last Year: Sometimes true  Transportation Needs: Unmet Transportation Needs (02/21/2022)   PRAPARE - Administrator, Civil Service (Medical): Yes    Lack of Transportation (Non-Medical): Yes  Physical Activity: Insufficiently Active  (01/20/2020)   Exercise Vital Sign    Days of Exercise per Week: 2 days    Minutes of Exercise per Session: 30 min  Stress: No Stress Concern Present (08/04/2019)   [employer] of Occupational Health - Occupational Stress Questionnaire    Feeling of Stress : Not at all  Social Connections: Unknown (01/20/2020)   Social Connection and Isolation Panel [NHANES]    Frequency of Communication with Friends and Family: Once a week    Frequency of Social Gatherings with Friends and Family: Once a week    Attends Religious Services: 1 to 4 times per year    Active Member of [employer] or Organizations: Yes    Attends Banker Meetings:  Never    Marital Status: Not on file  Intimate Partner Violence: Not At Risk (01/20/2020)   Humiliation, Afraid, Rape, and Kick questionnaire    Fear of Current or Ex-Partner: No    Emotionally Abused: No    Physically Abused: No    Sexually Abused: No     Review of Systems    General:  No chills, fever, night sweats or weight changes.  Cardiovascular:  No chest pain, dyspnea on exertion, edema, orthopnea, palpitations, paroxysmal nocturnal dyspnea. Dermatological: No rash, lesions/masses Respiratory: No cough, dyspnea Urologic: No hematuria, dysuria Abdominal:   No nausea, vomiting, diarrhea, bright red blood per rectum, melena, or hematemesis Neurologic:  No visual changes, wkns, changes in mental status. All other systems reviewed and are otherwise negative except as noted above.    Objective  Physical Exam    Blood pressure 116/88, pulse 84, temperature (!) 97.5 F (36.4 C), temperature source Oral, resp. rate (!) 9, weight 78.9 kg, SpO2 100%.  General: Pleasant, NAD Psych: Normal affect. Neuro: Alert and oriented X 3. Moves all extremities spontaneously. HEENT: Normal  Neck: Supple without bruits.  JVD to jaw. Lungs:  Resp regular and unlabored, diminished breath sounds at bilateral bases. Heart: irregular, tachycardic, no s3, s4, or  murmurs. Abdomen: Soft, non-tender, non-distended, BS + x 4.  No significant abdominal edema/ascites. Extremities: No clubbing, cyanosis or 3+ right lower extremity edema, 2+ left lower extremity edema. DP/PT2+, Radials 2+ and equal bilaterally.  Labs    Cardiac Enzymes Recent Labs  Lab 07/10/23 2230 07/11/23 0043  TROPONINIHS 114* 125*     BNP    Component Value Date/Time   BNP 2,153.9 (H) 07/10/2023 1700    Lab Results  Component Value Date   WBC 10.3 07/11/2023   HGB 12.4 (L) 07/11/2023   HCT 37.7 (L) 07/11/2023   MCV 101.3 (H) 07/11/2023   PLT 210 07/11/2023    Recent Labs  Lab 07/10/23 1659 07/11/23 0518  NA 137 140  K 3.4* 3.1*  CL 99 101  CO2 29 26  BUN 33* 29*  CREATININE 1.23 1.09  CALCIUM 8.9 8.1*  PROT 7.2  --   BILITOT 4.6*  --   ALKPHOS 242*  --   ALT 287*  --   AST 190*  --   GLUCOSE 92 144*   Lab Results  Component Value Date   CHOL 193 10/04/2020   HDL 46 10/04/2020   LDLCALC 131 (H) 10/04/2020   TRIG 88 10/04/2020    Radiology Studies    DG Chest 2 View  Result Date: 07/10/2023 CLINICAL DATA:  Bilateral feet swelling for the past couple weeks. EXAM: CHEST - 2 VIEW COMPARISON:  Radiograph 06/12/2020 FINDINGS: Stable cardiomegaly. Aortic atherosclerotic calcification. No focal consolidation, pleural effusion, or pneumothorax. No displaced rib fractures. IMPRESSION: No acute cardiopulmonary disease.  Cardiomegaly. Electronically Signed   By: Minerva Fester M.D.   On: 07/10/2023 23:27      ECG & Cardiac Imaging    Baseline artifact and tachycardia make interpretation difficult but likely atrial flutter @ 128, LAD, LVH, IVCD, lateral TWI, baseline artifact.  - personally reviewed. Telemetry shows atrial flutter with rates in the 90s to 120s.  Assessment & Plan    1. Acute on chronic HFrEF/NICM/Demand Ischemia: Patient with a prior history of nonischemic cardiomyopathy and slight recovery in EF to 45-50% in October 2021.  He has has since  been seen intermittently in heart failure clinic and by the community paramedic, though not  seen at all since January of this year.  He says he is taking previous prescribed medications, but is unable to provide any details as to which ones.  Over the past several months, he has had progressive dyspnea on exertion with walking and he says just starting earlier this week, he noted significant lower extremity edema, prompting presentation to the ED on the 14th.  Here, BNP elevated at 2153.9.  HsTrop 114  125.  Transaminitis noted with INR of 1.6 as well.  He is in atrial flutter on telemetry, which is a new diagnosis for him.  He does not routinely check his heart rate or blood pressure at home and therefore was unaware of when he may have started to experience tachycardia.  He is markedly volume overloaded on examination.  Renal function stable.  I will escalate to Lasix dosing.  Continue carvedilol and Entresto.  I will discontinue amlodipine in the setting of lower extremity edema.  Agree w/ addition of spironolactone, especially in light of hypokalemia.  Follow-up echo.  As we have never performed an ischemic evaluation, we may need to consider right and left heart cardiac catheterization prior to discharge.  2.  Atrial flutter with rapid ventricular response: Patient in atrial flutter on telemetry.  He is unaware of this.  Likely contributing to heart failure and volume excess.  Follow-up echo and TSH.  Continue carvedilol 25 mg twice daily.  Continue heparin for the time being with eye towards switching to oral anticoagulate prior to discharge.  If rate difficult to control, may need to consider TEE and cardioversion prior to discharge.  3.  Transaminitis: In the setting of #1.  Patient denies alcohol use.  Suspect hepatic congestion.  Follow with diuresis.  4.  HTN: Hypertensive on arrival but improved currently.  Discontinuing amlodipine in the setting of lower extremity swelling.  Continue Entresto,  spiro, and carvedilol.  5.  DMII: A1c was 6.0 in February 2022, previously as high as 7.2 in December 2020.  Per medicine team.  Will benefit from SGLT2 inhibitor if not cost prohibitive.  6.  Tob Abuse/COPD: Smoking less than a pack of cigarettes per day.  Complete cessation advised.  7.  Right foot blistering and skin breakdown: In the setting of #1.  Patient says had a large blister that burst on Tuesday.  Per medicine team.  Diurese.  8.  Hypokalemia: Supplementation ordered.  Cleda Daub added.  Risk Assessment/Risk Scores:     TIMI Risk Score for Unstable Angina or Non-ST Elevation MI:   The patient's TIMI risk score is 3, which indicates a 13% risk of all cause mortality, new or recurrent myocardial infarction or need for urgent revascularization in the next 14 days.  New York Heart Association (NYHA) Functional Class NYHA Class III  CHA2DS2-VASc Score = 3   This indicates a 3.2% annual risk of stroke. The patient's score is based upon: CHF History: 1 HTN History: 1 Diabetes History: 0 Stroke History: 0 Vascular Disease History: 0 Age Score: 1 Gender Score: 0     Signed, Nicolasa Ducking, NP 07/11/2023, 9:50 AM  For questions or updates, please contact   Please consult [email or web address] for contact info under Cardiology/STEMI.

## 2023-07-11 NOTE — Progress Notes (Signed)
PROGRESS NOTE    Kevin [name]  ZOX:[id number] DOB: November 23, 1955 DOA: 07/10/2023 PCP: Pcp, No      Brief Narrative:   67 year old with CHF with reduced EF, COPD, DM 2, HTN comes to the ED with worsening dyspnea on exertion and lower extremity swelling.  Patient was found to be in CHF exacerbation started on IV diuretics.  Assessment & Plan:  Principal Problem:   Acute on chronic combined systolic and diastolic CHF (congestive heart failure) (HCC) Active Problems:   Hypokalemia   Essential hypertension   Peripheral neuropathy   Dyslipidemia    Acute on chronic combined systolic and diastolic CHF (congestive heart failure) (HCC), EF 45% Bilateral lower extremity swelling with some blistering I am concerned about medication noncompliance.  Echocardiogram in the past had shown EF of 45% we will update this.  Continue with diuretics and other GDMT medications per cardiology team.  Defer LHC/RHC need to cardiology Empiric Keflex for 5 days for possible LE cellulitis.   Atrial flutter with RVR - Echocardiogram and TSH.  Continue Coreg.  On heparin drip    Hypokalemia Repleted as needed   Dyslipidemia -Pravastatin  COPD with tobacco use -As needed bronchodilators    Peripheral neuropathy -Gabapentin   Essential hypertension -Ongoing cardiac medication adjustments   DVT prophylaxis: Heparin drip Code Status: Full code code Family Communication:   Status is: Inpatient Continue hospital stay for IV antibiotics    Subjective: Hide, tells me his breathing is better, still has significant volume on him.   Examination:  General exam: Appears calm and comfortable  Respiratory system: Clear to auscultation. Respiratory effort normal. Cardiovascular system: S1 & S2 heard, RRR. No JVD, murmurs, rubs, gallops or clicks.  3+ bilateral lower extremity pitting edema Gastrointestinal system: Abdomen is nondistended, soft and nontender. No organomegaly or masses felt. Normal bowel  sounds heard. Central nervous system: Alert and oriented. No focal neurological deficits. Extremities: Symmetric 5 x 5 power. Skin: No rashes, lesions or ulcers Psychiatry: Judgement and insight appear normal. Mood & affect appropriate.                 Diet Orders (From admission, onward)     Start     Ordered   07/11/23 0034  Diet Heart Room service appropriate? Yes; Fluid consistency: Thin  Diet effective now       Question Answer Comment  Room service appropriate? Yes   Fluid consistency: Thin      07/11/23 0038            Objective: Vitals:   07/11/23 1100 07/11/23 1130 07/11/23 1200 07/11/23 1315  BP: 106/88 106/88  103/70  Pulse: 84 84  91  Resp: (!) 21 15  17   Temp:    98.2 F (36.8 C)  TempSrc:      SpO2: 98% 100%  93%  Weight:      Height:   5\' 8"  (1.727 m)     Intake/Output Summary (Last 24 hours) at 07/11/2023 1351 Last data filed at 07/11/2023 1052 Gross per 24 hour  Intake --  Output 500 ml  Net -500 ml   Filed Weights   07/11/23 0224  Weight: 78.9 kg    Scheduled Meds:  carvedilol  25 mg Oral BID WC   cephALEXin  500 mg Oral Q8H   furosemide  80 mg Intravenous Q12H   gabapentin  300 mg Oral BID   pravastatin  20 mg Oral q1800   sacubitril-valsartan  1 tablet Oral BID  spironolactone  25 mg Oral Daily   Continuous Infusions:  heparin 1,050 Units/hr (07/11/23 1251)    Nutritional status     Body mass index is 26.46 kg/m.  Data Reviewed:   CBC: Recent Labs  Lab 07/10/23 1659 07/11/23 0518  WBC 10.0 10.3  NEUTROABS 7.7  --   HGB 14.3 12.4*  HCT 44.0 37.7*  MCV 103.3* 101.3*  PLT 235 210   Basic Metabolic Panel: Recent Labs  Lab 07/10/23 1659 07/11/23 0518  NA 137 140  K 3.4* 3.1*  CL 99 101  CO2 29 26  GLUCOSE 92 144*  BUN 33* 29*  CREATININE 1.23 1.09  CALCIUM 8.9 8.1*  MG  --  2.1  PHOS  --  3.2   GFR: Estimated Creatinine Clearance: 64.5 mL/min (by C-G formula based on SCr of 1.09  mg/dL). Liver Function Tests: Recent Labs  Lab 07/10/23 1659  AST 190*  ALT 287*  ALKPHOS 242*  BILITOT 4.6*  PROT 7.2  ALBUMIN 3.7   Recent Labs  Lab 07/10/23 2212  LIPASE 34   Recent Labs  Lab 07/10/23 2212  AMMONIA 31   Coagulation Profile: Recent Labs  Lab 07/11/23 0323  INR 1.6*   Cardiac Enzymes: No results for input(s): "CKTOTAL", "CKMB", "CKMBINDEX", "TROPONINI" in the last 168 hours. BNP (last 3 results) No results for input(s): "PROBNP" in the last 8760 hours. HbA1C: No results for input(s): "HGBA1C" in the last 72 hours. CBG: No results for input(s): "GLUCAP" in the last 168 hours. Lipid Profile: No results for input(s): "CHOL", "HDL", "LDLCALC", "TRIG", "CHOLHDL", "LDLDIRECT" in the last 72 hours. Thyroid Function Tests: Recent Labs    07/11/23 0518  TSH 4.190   Anemia Panel: No results for input(s): "VITAMINB12", "FOLATE", "FERRITIN", "TIBC", "IRON", "RETICCTPCT" in the last 72 hours. Sepsis Labs: No results for input(s): "PROCALCITON", "LATICACIDVEN" in the last 168 hours.  No results found for this or any previous visit (from the past 240 hour(s)).       Radiology Studies: DG Chest 2 View  Result Date: 07/10/2023 CLINICAL DATA:  Bilateral feet swelling for the past couple weeks. EXAM: CHEST - 2 VIEW COMPARISON:  Radiograph 06/12/2020 FINDINGS: Stable cardiomegaly. Aortic atherosclerotic calcification. No focal consolidation, pleural effusion, or pneumothorax. No displaced rib fractures. IMPRESSION: No acute cardiopulmonary disease.  Cardiomegaly. Electronically Signed   By: Minerva Fester M.D.   On: 07/10/2023 23:27           LOS: 1 day   Time spent= 35 mins    Miguel Rota, MD [facility]  If 7PM-7AM, please contact night-coverage  07/11/2023, 1:51 PM

## 2023-07-11 NOTE — Assessment & Plan Note (Signed)
-   We will continue anti-hypertensive therapy. 

## 2023-07-11 NOTE — Progress Notes (Signed)
Heart Failure Stewardship Pharmacy Note  PCP: Pcp, No PCP-Cardiologist: Debbe Odea, MD  HPI: Kevin [name] is a 67 y.o. male with chronic combined systolic diastolic CHF, COPD, tobacco use, type 2 diabetes mellitus and hypertension who presented with worsening dyspnea and lower extremity edema. Patient reports medication compliance, however fill history suggests missed doses.   Pertinent cardiac history: Admitted in 07/2019 with anasarca and new onset CHF. Echo showed LVEF of 25-30% with grade II disatolic dysfunction, and moderately reduced RV function. GDMT was added and follow-up echo in 12/2019 showed LVEF improved to 50-55% with grade II diastolic dysfunction, and normal RV function. Most recent echo was in 05/2020 with LVEF of 45-50%. BNP was markedly elevated on admission to 2153.9. HS-troponin on admission was 114.   Pertinent Lab Values: Creatinine, Ser  Date Value Ref Range Status  07/11/2023 1.09 0.61 - 1.24 mg/dL Final   BUN  Date Value Ref Range Status  07/11/2023 29 (H) 8 - 23 mg/dL Final  16/05/9603 12 8 - 27 mg/dL Final   Potassium  Date Value Ref Range Status  07/11/2023 3.1 (L) 3.5 - 5.1 mmol/L Final   Sodium  Date Value Ref Range Status  07/11/2023 140 135 - 145 mmol/L Final  10/04/2020 141 134 - 144 mmol/L Final   B Natriuretic Peptide  Date Value Ref Range Status  07/10/2023 2,153.9 (H) 0.0 - 100.0 pg/mL Final    Comment:    Performed at [facility], [address] [place], [place] [address]   Magnesium  Date Value Ref Range Status  08/03/2019 1.9 1.7 - 2.4 mg/dL Final    Comment:    Performed at [facility], [address] [place], [place] [address]   Hgb A1c MFr Bld  Date Value Ref Range Status  10/04/2020 6.0 (H) 4.8 - 5.6 % Final    Comment:             Prediabetes: 5.7 - 6.4          Diabetes: >6.4          Glycemic control for adults with diabetes: <7.0    TSH  Date Value Ref Range Status  06/12/2020 0.468  0.350 - 4.500 uIU/mL Final    Comment:    Performed by a 3rd Generation assay with a functional sensitivity of <=0.01 uIU/mL. Performed at [facility], [address] [place], [place] [address]    Vital Signs: Admission weight: pending Temp:  [97.5 F (36.4 C)-98.3 F (36.8 C)] 97.5 F (36.4 C) (11/15 0730) Pulse Rate:  [64-130] 84 (11/15 0730) Cardiac Rhythm: Atrial fibrillation (11/15 0746) Resp:  [9-26] 9 (11/15 0730) BP: (113-158)/(88-124) 116/88 (11/15 0730) SpO2:  [94 %-100 %] 100 % (11/15 0730) Weight:  [78.9 kg (174 lb)] 78.9 kg (174 lb) (11/15 0224) No intake or output data in the 24 hours ending 07/11/23 0951  Current Heart Failure Medications:  Loop diuretic: furosemide 60 mg IV BID Beta-Blocker: carvedilol 25 mg BID ACEI/ARB/ARNI: Entresto 97-103 mg BID MRA: spironolactone 25 mg daily SGLT2i: none Other vasoactive mediations include amlodipine 10 mg daily  Prior to admission Heart Failure Medications:  Loop diuretic: furosemide 40 mg daily Beta-Blocker: carvedilol 25 mg BID ACEI/ARB/ARNI: Entresto 97/103 mg BID MRA: spironolactone 25 mg daily SGLT2i: none Other vasoactive mediations include amlodipine 10 mg daily  Assessment: 1. Acute on chronic combined systolic and diastolic heart failure (LVEF previously 25-30%, improved to 45-50%) with grade II diastolic dysfunction, due to NICM. NYHA class III  symptoms.  -Symptoms: Reports worsening shortness of breath over 4 days alogn with LEE. Reports good appetite and no excess fatigue. -Volume: Appears hypervolemic on exam. LEE present. JVD is present. Agree with IV diuresis. Can monitor urine output on 60 BID and increase if needed.  -Hemodynamics: BP elevated on admission and now WNL after resuming GDMT.  -BB: Carvedilol is at target dose. Okay to continue since the patient does not have symptoms of low output. -ACEI/ARB/ARNI: Sherryll Burger is at target dose. -MRA: Spironolactone is at target dose. -SGLT2i:  Can consider adding SGLT2i for acute diuresis and GDMT. Patient diuresed with SGLT2i and Lasix in EMPULSE trial sustained fewer AKI events and greater weight loss. -If patient becomes hypotensive, would stop amlodipine first as it provides no benefit for HF.   Plan: 1) Medication changes recommended at this time: -Can consider adding Farxiga 10 mg daily  2) Patient assistance: Sherryll Burger, Farxiga, and Jardiance are $0  3) Education: - Patient has been educated on current HF medications and potential additions to HF medication regimen - Patient verbalizes understanding that over the next few months, these medication doses may change and more medications may be added to optimize HF regimen - Patient has been educated on basic disease state pathophysiology and goals of therapy  Medication Assistance / Insurance Benefits Check: Does the patient have prescription insurance?  Yes  Type of insurance plan:  Does the patient qualify for medication assistance through manufacturers or grants? No   Outpatient Pharmacy: Prior to admission outpatient pharmacy: [facility]   Is the patient willing to utilize a [facility] pharmacy at discharge?: Yes Please do not hesitate to reach out with questions or concerns,  Enos Fling, PharmD, CPP, BCPS Heart Failure Pharmacist  Phone - [phone] 07/11/2023 9:51 AM

## 2023-07-12 ENCOUNTER — Inpatient Hospital Stay (HOSPITAL_COMMUNITY)
Admit: 2023-07-12 | Discharge: 2023-07-12 | Disposition: A | Discharge status: Home / Self Care | Payer: Medicare HMO | Attending: Family Medicine

## 2023-07-12 DIAGNOSIS — I10 Essential (primary) hypertension: Secondary | ICD-10-CM

## 2023-07-12 DIAGNOSIS — I5021 Acute systolic (congestive) heart failure: Secondary | ICD-10-CM | POA: Diagnosis not present

## 2023-07-12 DIAGNOSIS — I5043 Acute on chronic combined systolic (congestive) and diastolic (congestive) heart failure: Secondary | ICD-10-CM | POA: Diagnosis not present

## 2023-07-12 DIAGNOSIS — R7401 Elevation of levels of liver transaminase levels: Secondary | ICD-10-CM

## 2023-07-12 DIAGNOSIS — R600 Localized edema: Secondary | ICD-10-CM

## 2023-07-12 LAB — CBC
HCT: 38.7 % — ABNORMAL LOW (ref 39.0–52.0)
Hemoglobin: 12.7 g/dL — ABNORMAL LOW (ref 13.0–17.0)
MCH: 33.1 pg (ref 26.0–34.0)
MCHC: 32.8 g/dL (ref 30.0–36.0)
MCV: 100.8 fL — ABNORMAL HIGH (ref 80.0–100.0)
Platelets: 190 10*3/uL (ref 150–400)
RBC: 3.84 MIL/uL — ABNORMAL LOW (ref 4.22–5.81)
RDW: 15.7 % — ABNORMAL HIGH (ref 11.5–15.5)
WBC: 10.1 10*3/uL (ref 4.0–10.5)
nRBC: 0 % (ref 0.0–0.2)

## 2023-07-12 LAB — MAGNESIUM: Magnesium: 1.9 mg/dL (ref 1.7–2.4)

## 2023-07-12 LAB — ECHOCARDIOGRAM COMPLETE
AR max vel: 2.08 cm2
AV Peak grad: 5.5 mm[Hg]
Ao pk vel: 1.17 m/s
Area-P 1/2: 4.17 cm2
Calc EF: 19.5 %
Height: 68 in
S' Lateral: 4.8 cm
Single Plane A2C EF: 14.2 %
Single Plane A4C EF: 25.2 %
Weight: 2784 [oz_av]

## 2023-07-12 MED ORDER — TORSEMIDE 20 MG PO TABS
40.0000 mg | ORAL_TABLET | Freq: Three times a day (TID) | ORAL | Status: DC
  Administered 2023-07-12 – 2023-07-13 (×2): 40 mg via ORAL
  Filled 2023-07-12 (×2): qty 2

## 2023-07-12 NOTE — Plan of Care (Signed)

## 2023-07-12 NOTE — Progress Notes (Signed)
PROGRESS NOTE    Kevin [name]  VOZ:[id number] DOB: [birth year] DOA: 07/10/2023 PCP: Pcp, No      Brief Narrative:   67 year old with CHF with reduced EF, COPD, DM 2, HTN comes to the ED with worsening dyspnea on exertion and lower extremity swelling.  Patient was found to be in CHF exacerbation started on IV diuretics.  Assessment & Plan:  Principal Problem:   Acute on chronic combined systolic and diastolic CHF (congestive heart failure) (HCC) Active Problems:   Hypokalemia   Essential hypertension   Peripheral neuropathy   Dyslipidemia    Acute on chronic combined systolic and diastolic CHF (congestive heart failure) (HCC), EF 45% Bilateral lower extremity swelling with some blistering I am concerned about medication noncompliance.  Echocardiogram in the past had shown EF of 45%, repeat echo pending - Continue diuretics.  GDMT per cardiology- Adv CHF team to follow on Monday Empiric Keflex for 5 days for possible LE cellulitis.   Atrial flutter with RVR - TSH normal, on heparin drip.  Continue Coreg - May require cardioversion if he does not get back to normal sinus rhythm.    Hypokalemia Repleted as needed   Dyslipidemia -Pravastatin  COPD with tobacco use -As needed bronchodilators    Peripheral neuropathy -Gabapentin   Essential hypertension -Ongoing cardiac medication adjustments  Declining prescription medications.  Discussed with cardiology.  Possible underlying psych issues, psychiatry consulted.   DVT prophylaxis: Heparin drip Code Status: Full code code Family Communication:   Status is: Inpatient Continue hospital stay for IV diuretics    Subjective: Patient seen at bedside.  Refusing all of his medication.  He has covered his head with his bedsheet and refuses to take it off to speak with me.  Denies any complaints.  Examination:  General exam: Appears calm and comfortable  Respiratory system: Clear to auscultation. Respiratory effort  normal. Cardiovascular system: S1 & S2 heard, RRR. No JVD, murmurs, rubs, gallops or clicks.  3+ bilateral lower extremity pitting edema Gastrointestinal system: Abdomen is nondistended, soft and nontender. No organomegaly or masses felt. Normal bowel sounds heard. Central nervous system: Alert and oriented. No focal neurological deficits. Extremities: Symmetric 5 x 5 power. Skin: No rashes, lesions or ulcers Psychiatry: Judgement and insight appear poor                Diet Orders (From admission, onward)     Start     Ordered   07/11/23 0034  Diet Heart Room service appropriate? Yes; Fluid consistency: Thin  Diet effective now       Question Answer Comment  Room service appropriate? Yes   Fluid consistency: Thin      07/11/23 0038            Objective: Vitals:   07/11/23 1400 07/11/23 1532 07/11/23 1942 07/12/23 1206  BP: 107/89 102/85 91/78 102/74  Pulse: 85 86 61 83  Resp: 10 18 18 17   Temp:  97.8 F (36.6 C) 97.8 F (36.6 C) 97.7 F (36.5 C)  TempSrc:    Oral  SpO2: 98% 100% 99% (!) 80%  Weight:      Height:        Intake/Output Summary (Last 24 hours) at 07/12/2023 1337 Last data filed at 07/12/2023 0444 Gross per 24 hour  Intake --  Output 600 ml  Net -600 ml   Filed Weights   07/11/23 0224  Weight: 78.9 kg    Scheduled Meds:  carvedilol  6.25 mg Oral BID WC  cephALEXin  500 mg Oral Q8H   furosemide  80 mg Intravenous Q12H   gabapentin  300 mg Oral BID   pravastatin  20 mg Oral q1800   sacubitril-valsartan  1 tablet Oral BID   spironolactone  25 mg Oral Daily   Continuous Infusions:  heparin 1,100 Units/hr (07/11/23 1636)    Nutritional status     Body mass index is 26.46 kg/m.  Data Reviewed:   CBC: Recent Labs  Lab 07/10/23 1659 07/11/23 0518  WBC 10.0 10.3  NEUTROABS 7.7  --   HGB 14.3 12.4*  HCT 44.0 37.7*  MCV 103.3* 101.3*  PLT 235 210   Basic Metabolic Panel: Recent Labs  Lab 07/10/23 1659 07/11/23 0518   NA 137 140  K 3.4* 3.1*  CL 99 101  CO2 29 26  GLUCOSE 92 144*  BUN 33* 29*  CREATININE 1.23 1.09  CALCIUM 8.9 8.1*  MG  --  2.1  PHOS  --  3.2   GFR: Estimated Creatinine Clearance: 64.5 mL/min (by C-G formula based on SCr of 1.09 mg/dL). Liver Function Tests: Recent Labs  Lab 07/10/23 1659  AST 190*  ALT 287*  ALKPHOS 242*  BILITOT 4.6*  PROT 7.2  ALBUMIN 3.7   Recent Labs  Lab 07/10/23 2212  LIPASE 34   Recent Labs  Lab 07/10/23 2212  AMMONIA 31   Coagulation Profile: Recent Labs  Lab 07/11/23 0323  INR 1.6*   Cardiac Enzymes: No results for input(s): "CKTOTAL", "CKMB", "CKMBINDEX", "TROPONINI" in the last 168 hours. BNP (last 3 results) No results for input(s): "PROBNP" in the last 8760 hours. HbA1C: No results for input(s): "HGBA1C" in the last 72 hours. CBG: No results for input(s): "GLUCAP" in the last 168 hours. Lipid Profile: No results for input(s): "CHOL", "HDL", "LDLCALC", "TRIG", "CHOLHDL", "LDLDIRECT" in the last 72 hours. Thyroid Function Tests: Recent Labs    07/11/23 0518  TSH 4.190   Anemia Panel: Recent Labs    07/11/23 1238  VITAMINB12 1,477*   Sepsis Labs: No results for input(s): "PROCALCITON", "LATICACIDVEN" in the last 168 hours.  No results found for this or any previous visit (from the past 240 hour(s)).       Radiology Studies: DG Chest 2 View  Result Date: 07/10/2023 CLINICAL DATA:  Bilateral feet swelling for the past couple weeks. EXAM: CHEST - 2 VIEW COMPARISON:  Radiograph 06/12/2020 FINDINGS: Stable cardiomegaly. Aortic atherosclerotic calcification. No focal consolidation, pleural effusion, or pneumothorax. No displaced rib fractures. IMPRESSION: No acute cardiopulmonary disease.  Cardiomegaly. Electronically Signed   By: Minerva Fester M.D.   On: 07/10/2023 23:27           LOS: 2 days   Time spent= 35 mins    Miguel Rota, MD [facility]  If 7PM-7AM, please contact  night-coverage  07/12/2023, 1:37 PM

## 2023-07-12 NOTE — Progress Notes (Signed)
  Echocardiogram 2D Echocardiogram has been performed.  Lenor Coffin 07/12/2023, 3:22 PM

## 2023-07-12 NOTE — Progress Notes (Signed)
ANTICOAGULATION CONSULT NOTE  Pharmacy Consult for heparin infusion Indication: ACS/STEMI  No Known Allergies  Patient Measurements: Height: 5\' 8"  (172.7 cm) Weight: 78.9 kg (174 lb) IBW/kg (Calculated) : 68.4 Heparin Dosing Weight: 78.9 kg  Vital Signs: Temp: 97.7 F (36.5 C) (11/16 1206) Temp Source: Oral (11/16 1206) BP: 102/74 (11/16 1206) Pulse Rate: 83 (11/16 1206)  Labs: Recent Labs    07/10/23 1659 07/10/23 2230 07/11/23 0043 07/11/23 0323 07/11/23 0518 07/11/23 0838 07/11/23 1530 07/12/23 1612  HGB 14.3  --   --   --  12.4*  --   --  12.7*  HCT 44.0  --   --   --  37.7*  --   --  38.7*  PLT 235  --   --   --  210  --   --  190  APTT  --   --   --  >200*  --  131*  --   --   LABPROT  --   --   --  18.9*  --   --   --   --   INR  --   --   --  1.6*  --   --   --   --   HEPARINUNFRC  --   --   --   --   --  0.40 0.30  --   CREATININE 1.23  --   --   --  1.09  --   --   --   TROPONINIHS  --  114* 125*  --   --   --   --   --     Estimated Creatinine Clearance: 64.5 mL/min (by C-G formula based on SCr of 1.09 mg/dL).   Medical History: Past Medical History:  Diagnosis Date   COPD (chronic obstructive pulmonary disease) (HCC)    Diabetes mellitus without complication (HCC)    Heart failure with mid-range ejection fraction (HCC)    a. 2019 reported neg stress test and nl cors on cath in [facility]; b. 07/2019 Echo: EF 25-30%, Gr2 DD, glob HK. Mod reduced RV fxn w/ volume overload. Mod dil RA. Mild to mod TR. Mod elev PASP; b. 12/2019 Echo: EF 50-55%; c. 05/2020 Echo: EF 45-50%, glob HK, sev LVH, GrII DD, nl RV size/fxn, mildly BAE, mild AoV sclerosis, mildly dilated Ao root.   Hypertension    NICM (nonischemic cardiomyopathy) (HCC)    Noncompliance    Pleural effusion on right    a. 07/2019 Thoracentesis: 300 ml.   Tobacco abuse     Assessment: Pt is a 67 yo male presenting to ED c/o bilateral lower extremity swelling, found with elevated BNP and Troponin  I level, trending up. Pharmacy has been consulted to manage heparin continuous infusion. Patient not on chronic anticoagulation prior to admission, per chart review.  Goal of Therapy:  Heparin level 0.3-0.7 units/ml Monitor platelets by anticoagulation protocol: Yes  Monitoring: 11/15 0838 HL 0.40 Therapeutic x 1 11/15 1530 HL 0.30 Therapeutic x 2 (barely)  Patient refusing labs on 11/16. Will continue heparin infusion at current rate and re-attempt to draw labs in AM.  Plan:  Continue heparin infusion at 1100 units/hour Check HL with AM labs Continue to monitor CBC daily while on heparin infusion.  Thank you for involving pharmacy in this patient's care.   Rockwell Alexandria, PharmD Clinical Pharmacist 07/12/2023 5:24 PM

## 2023-07-12 NOTE — Progress Notes (Signed)
Attempted to give patient scheduled medication x2. Patient refusing medication , heparin drip, labs and tele. Provider aware.

## 2023-07-12 NOTE — TOC Initial Note (Signed)
Transition of Care [facility]) - Initial/Assessment Note    Patient Details  Name: Kevin [name] MRN: [id number] Date of Birth: [birth year]  Transition of Care [facility]) CM/SW Contact:    Colette Ribas, LCSWA Phone Number: 07/12/2023, 3:31 PM  Clinical Narrative:                 CSW spoke with patients sister who is agreeable to [facility] reccs. CSW obtain permission to start search. Patient and family have no preferences. Reached out to a few HH and advised I will follow-up with options.        Patient Goals and CMS Choice            Expected Discharge Plan and Services                                              Prior Living Arrangements/Services                       Activities of Daily Living   ADL Screening (condition at time of admission) Independently performs ADLs?: Yes (appropriate for developmental age) Is the patient deaf or have difficulty hearing?: No Does the patient have difficulty seeing, even when wearing glasses/contacts?: No Does the patient have difficulty concentrating, remembering, or making decisions?: No  Permission Sought/Granted                  Emotional Assessment              Admission diagnosis:  Peripheral edema [R60.0] Acute on chronic systolic (congestive) heart failure (HCC) [I50.23] Acute on chronic congestive heart failure, unspecified heart failure type (HCC) [I50.9] Patient Active Problem List   Diagnosis Date Noted   Acute on chronic combined systolic and diastolic CHF (congestive heart failure) (HCC) 07/11/2023   Essential hypertension 07/11/2023   Peripheral neuropathy 07/11/2023   Dyslipidemia 07/11/2023   Hypokalemia 07/11/2023   COPD (chronic obstructive pulmonary disease) (HCC) 06/12/2020   Chronic diastolic CHF (congestive heart failure) (HCC) 06/12/2020   Hyperlipidemia associated with type 2 diabetes mellitus (HCC) 12/23/2019   Hypertension associated with diabetes (HCC) 12/07/2019   Type 2  diabetes, controlled, with neuropathy (HCC)    Hypotension    Nonsustained ventricular tachycardia (HCC)    Elevated LFTs    Lower extremity edema    PCP:  Pcp, No Pharmacy:   [facility] [address] ([address]), [place] - [address] [place]) [place] [address] Phone: [phone] Fax: [phone]     Social Determinants of Health (SDOH) Social History: SDOH Screenings   Food Insecurity: No Food Insecurity (07/11/2023)  Housing: Low Risk  (07/11/2023)  Transportation Needs: Unmet Transportation Needs (07/11/2023)  Utilities: Not At Risk (07/11/2023)  Depression (PHQ2-9): Low Risk  (09/18/2021)  Financial Resource Strain: High Risk (05/06/2022)  Physical Activity: Insufficiently Active (01/20/2020)  Social Connections: Unknown (01/20/2020)  Stress: No Stress Concern Present (08/04/2019)  Tobacco Use: High Risk (07/11/2023)   SDOH Interventions:     Readmission Risk Interventions     No data to display

## 2023-07-12 NOTE — Consult Note (Signed)
PHARMACY CONSULT NOTE - ELECTROLYTES  Pharmacy Consult for Electrolyte Monitoring and Replacement   Recent Labs: Height: 5\' 8"  (172.7 cm) Weight: 78.9 kg (174 lb) IBW/kg (Calculated) : 68.4 Estimated Creatinine Clearance: 64.5 mL/min (by C-G formula based on SCr of 1.09 mg/dL). Potassium (mmol/L)  Date Value  07/11/2023 3.1 (L)   Magnesium (mg/dL)  Date Value  29/56/2130 1.9   Calcium (mg/dL)  Date Value  86/57/8469 8.1 (L)   Albumin (g/dL)  Date Value  62/95/2841 3.7  10/04/2020 4.5   Phosphorus (mg/dL)  Date Value  32/44/0102 3.2   Sodium (mmol/L)  Date Value  07/11/2023 140  10/04/2020 141    Assessment  Kevin [name] is a 67 y.o. male presenting with acute onchronic heart failure and atrial flutter. PMH significant for HFimpEF, HTN, DM, COPD, and tobacco use. Pharmacy has been consulted to monitor and replace electrolytes. Patient's renal function currently stable.  Diet: Cardiac MIVF: N/A Pertinent medications: Entresto 97/103mg  BID, Spironolactone 25mg  daily, Furosemide 80mg  IV q12H  Goal of Therapy: Electrolytes WNL  Plan:  Patient refusing labs on 11/16 Mg 1.9 >> no replacement warranted Check BMP and Mg with AM labs  Thank you for involving pharmacy in this patient's care.   Rockwell Alexandria, PharmD Clinical Pharmacist 07/12/2023 5:26 PM

## 2023-07-12 NOTE — Progress Notes (Signed)
Rounding Note    Patient Name: Kevin [name] Date of Encounter: 07/12/2023  [facility] Cardiologist: Debbe Odea, MD   Subjective  Received messages from nursing and hospitalist that he was declining all of his medications On further discussion, minimally conversant, kept his head down Reports he did not want IV in place, had taken his telemetry off, declining all oral medications Did not specify the reason why Argued with me that "I was not the doctor", "I did not know that he was in heart failure". "  How would you know that?"  Inpatient Medications    Scheduled Meds:  carvedilol  6.25 mg Oral BID WC   cephALEXin  500 mg Oral Q8H   furosemide  80 mg Intravenous Q12H   gabapentin  300 mg Oral BID   pravastatin  20 mg Oral q1800   sacubitril-valsartan  1 tablet Oral BID   spironolactone  25 mg Oral Daily   Continuous Infusions:  heparin Stopped (07/12/23 0730)   PRN Meds: acetaminophen **OR** acetaminophen, guaiFENesin, hydrALAZINE, ipratropium-albuterol, magnesium hydroxide, ondansetron **OR** ondansetron (ZOFRAN) IV, oxyCODONE-acetaminophen, senna-docusate, traZODone   Vital Signs    Vitals:   07/11/23 1400 07/11/23 1532 07/11/23 1942 07/12/23 1206  BP: 107/89 102/85 91/78 102/74  Pulse: 85 86 61 83  Resp: 10 18 18 17   Temp:  97.8 F (36.6 C) 97.8 F (36.6 C) 97.7 F (36.5 C)  TempSrc:    Oral  SpO2: 98% 100% 99% (!) 80%  Weight:      Height:        Intake/Output Summary (Last 24 hours) at 07/12/2023 1804 Last data filed at 07/12/2023 1500 Gross per 24 hour  Intake 480 ml  Output 900 ml  Net -420 ml      07/11/2023    2:24 AM 02/28/2023    4:26 PM 09/18/2022    8:39 AM  Last 3 Weights  Weight (lbs) 174 lb 179 lb 168 lb  Weight (kg) 78.926 kg 81.194 kg 76.204 kg      Telemetry    Unavailable- Personally Reviewed  ECG     - Personally Reviewed  Physical Exam   GEN: No acute distress.   Neck:  JVD 10+ Cardiac: RRR, no  murmurs, rubs, or gallops.  2+ pitting edema mid shins to the feet Respiratory: Clear to auscultation bilaterally. GI: Soft, nontender, non-distended  MS: No edema; No deformity. Skin: Open wound right foot Neuro:  Nonfocal  Psych: Minimally conversant  Labs    High Sensitivity Troponin:   Recent Labs  Lab 07/10/23 2230 07/11/23 0043  TROPONINIHS 114* 125*     Chemistry Recent Labs  Lab 07/10/23 1659 07/11/23 0518 07/12/23 1612  NA 137 140  --   K 3.4* 3.1*  --   CL 99 101  --   CO2 29 26  --   GLUCOSE 92 144*  --   BUN 33* 29*  --   CREATININE 1.23 1.09  --   CALCIUM 8.9 8.1*  --   MG  --  2.1 1.9  PROT 7.2  --   --   ALBUMIN 3.7  --   --   AST 190*  --   --   ALT 287*  --   --   ALKPHOS 242*  --   --   BILITOT 4.6*  --   --   GFRNONAA >60 >60  --   ANIONGAP 9 13  --     Lipids No results for  input(s): "CHOL", "TRIG", "HDL", "LABVLDL", "LDLCALC", "CHOLHDL" in the last 168 hours.  Hematology Recent Labs  Lab 07/10/23 1659 07/11/23 0518 07/12/23 1612  WBC 10.0 10.3 10.1  RBC 4.26 3.72* 3.84*  HGB 14.3 12.4* 12.7*  HCT 44.0 37.7* 38.7*  MCV 103.3* 101.3* 100.8*  MCH 33.6 33.3 33.1  MCHC 32.5 32.9 32.8  RDW 16.2* 16.0* 15.7*  PLT 235 210 190   Thyroid  Recent Labs  Lab 07/11/23 0518  TSH 4.190    BNP Recent Labs  Lab 07/10/23 1700  BNP 2,153.9*    DDimer No results for input(s): "DDIMER" in the last 168 hours.   Radiology    ECHOCARDIOGRAM COMPLETE  Result Date: 07/12/2023    ECHOCARDIOGRAM REPORT   Patient Name:   Kevin [name] Date of Exam: 07/12/2023 Medical Rec #:  [id number]   Height:       68.0 in Accession #:    [id number]  Weight:       174.0 lb Date of Birth:  [birth year]    BSA:          1.926 m Patient Age:    67 years    BP:           118/79 mmHg Patient Gender: M           HR:           90 bpm. Exam Location:  [facility] Procedure: 2D Echo, 3D Echo and Strain Analysis Indications:     CHF I50.21  History:         Patient has prior history  of Echocardiogram examinations, most                  recent 06/12/2020.  Sonographer:     Overton Mam RDCS, FASE Referring Phys:  [id number] Vernetta Honey MANSY Diagnosing Phys: Julien Nordmann MD  Sonographer Comments: Global longitudinal strain was attempted. IMPRESSIONS  1. Left ventricular ejection fraction, by estimation, is 20 to 25%. Left ventricular ejection fraction by 3D volume is 20 %. The left ventricle has severely decreased function. The left ventricle has no regional wall motion abnormalities. There is moderate left ventricular hypertrophy. Left ventricular diastolic parameters are indeterminate. The average left ventricular global longitudinal strain is -6.3 %.  2. Right ventricular systolic function is moderately reduced. The right ventricular size is normal. There is normal pulmonary artery systolic pressure. The estimated right ventricular systolic pressure is 35.9 mmHg.  3. Left atrial size was moderately dilated.  4. Right atrial size was moderately dilated.  5. The mitral valve is normal in structure. Mild mitral valve regurgitation. No evidence of mitral stenosis.  6. Tricuspid valve regurgitation is moderate to severe.  7. The aortic valve is tricuspid. Aortic valve regurgitation is not visualized. No aortic stenosis is present.  8. Severely dilated pulmonary artery.  9. The inferior vena cava is normal in size with greater than 50% respiratory variability, suggesting right atrial pressure of 3 mmHg. FINDINGS  Left Ventricle: Left ventricular ejection fraction, by estimation, is 20 to 25%. Left ventricular ejection fraction by 3D volume is 20 %. The left ventricle has severely decreased function. The left ventricle has no regional wall motion abnormalities. The average left ventricular global longitudinal strain is -6.3 %. The left ventricular internal cavity size was normal in size. There is moderate left ventricular hypertrophy. Left ventricular diastolic parameters are indeterminate. Right  Ventricle: The right ventricular size is normal. No increase in right ventricular wall thickness. Right ventricular systolic function  is moderately reduced. There is normal pulmonary artery systolic pressure. The tricuspid regurgitant velocity is 2.78 m/s, and with an assumed right atrial pressure of 5 mmHg, the estimated right ventricular systolic pressure is 35.9 mmHg. Left Atrium: Left atrial size was moderately dilated. Right Atrium: Right atrial size was moderately dilated. Pericardium: There is no evidence of pericardial effusion. Mitral Valve: The mitral valve is normal in structure. Mild mitral valve regurgitation. No evidence of mitral valve stenosis. Tricuspid Valve: The tricuspid valve is normal in structure. Tricuspid valve regurgitation is moderate to severe. No evidence of tricuspid stenosis. Aortic Valve: The aortic valve is tricuspid. Aortic valve regurgitation is not visualized. No aortic stenosis is present. Aortic valve peak gradient measures 5.5 mmHg. Pulmonic Valve: The pulmonic valve was normal in structure. Pulmonic valve regurgitation is not visualized. No evidence of pulmonic stenosis. Aorta: The aortic root is normal in size and structure. Pulmonary Artery: The pulmonary artery is severely dilated. Venous: The inferior vena cava is normal in size with greater than 50% respiratory variability, suggesting right atrial pressure of 3 mmHg. IAS/Shunts: No atrial level shunt detected by color flow Doppler.  LEFT VENTRICLE PLAX 2D LVIDd:         5.40 cm         Diastology LVIDs:         4.80 cm         LV e' medial:    5.11 cm/s LV PW:         1.30 cm         LV E/e' medial:  16.8 LV IVS:        1.40 cm         LV e' lateral:   7.94 cm/s LVOT diam:     2.00 cm         LV E/e' lateral: 10.8 LV SV:         34 LV SV Index:   18              2D LVOT Area:     3.14 cm        Longitudinal                                Strain                                2D Strain GLS  -6.3 % LV Volumes (MOD)                Avg: LV vol d, MOD    148.0 ml A2C:                           3D Volume EF LV vol d, MOD    106.0 ml      LV 3D EF:    Left A4C:                                        ventricul LV vol s, MOD    127.0 ml                   ar A2C:  ejection LV vol s, MOD    79.3 ml                    fraction A4C:                                        by 3D LV SV MOD A2C:   21.0 ml                    volume is LV SV MOD A4C:   106.0 ml                   20 %. LV SV MOD BP:    25.4 ml                                 3D Volume EF:                                3D EF:        20 %                                LV EDV:       188 ml                                LV ESV:       151 ml                                LV SV:        37 ml RIGHT VENTRICLE RV Basal diam:  3.80 cm RV S prime:     12.10 cm/s TAPSE (M-mode): 1.4 cm LEFT ATRIUM             Index        RIGHT ATRIUM           Index LA diam:        4.20 cm 2.18 cm/m   RA Area:     32.70 cm LA Vol (A2C):   98.7 ml 51.24 ml/m  RA Volume:   125.00 ml 64.89 ml/m LA Vol (A4C):   52.5 ml 27.25 ml/m LA Biplane Vol: 75.0 ml 38.93 ml/m  AORTIC VALVE                 PULMONIC VALVE AV Area (Vmax): 2.08 cm     PV Vmax:       0.74 m/s AV Vmax:        117.00 cm/s  PV Peak grad:  2.2 mmHg AV Peak Grad:   5.5 mmHg LVOT Vmax:      77.40 cm/s LVOT Vmean:     51.500 cm/s LVOT VTI:       0.109 m                              PULMONARY ARTERY AORTA                        MPA  diam:        2.90 cm Ao Root diam: 3.50 cm Ao Asc diam:  3.20 cm MITRAL VALVE               TRICUSPID VALVE MV Area (PHT): 4.17 cm    TR Peak grad:   30.9 mmHg MV Decel Time: 182 msec    TR Vmax:        278.00 cm/s MV E velocity: 85.90 cm/s                            SHUNTS                            Systemic VTI:  0.11 m                            Systemic Diam: 2.00 cm Julien Nordmann MD Electronically signed by Julien Nordmann MD Signature Date/Time: 07/12/2023/4:04:37 PM    Final     DG Chest 2 View  Result Date: 07/10/2023 CLINICAL DATA:  Bilateral feet swelling for the past couple weeks. EXAM: CHEST - 2 VIEW COMPARISON:  Radiograph 06/12/2020 FINDINGS: Stable cardiomegaly. Aortic atherosclerotic calcification. No focal consolidation, pleural effusion, or pneumothorax. No displaced rib fractures. IMPRESSION: No acute cardiopulmonary disease.  Cardiomegaly. Electronically Signed   By: Minerva Fester M.D.   On: 07/10/2023 23:27    Cardiac Studies   Echo as above  Patient Profile     Kevin [name] is a 67 y.o. male with a history of NICM, HFimpEF, HTN, DM, tob abuse, and COPD, who is being seen today for the evaluation of acute on chronic CHF   Assessment & Plan    Acute on chronic diastolic and systolic CHF History of nonischemic cardiomyopathy, ejection fraction 45 to 50% in October 2021 Echocardiogram performed today with ejection fraction 20 to 25%, RV with moderately reduced function -Medication noncompliance -Declining medications today -Significant leg edema, elevated JVD, transaminitis with INR 1.6, atrial flutter all consistent with acute CHF -Not a good candidate for ischemic workup given medication noncompliance, declining medications -If he continues to decline medications, would consider palliative care consult -Will discontinue Lasix IV as he has no IV and is declining placement of one We will start torsemide 40 every 8 hours, may need higher dose torsemide or addition of metolazone if no urine output  Atrial flutter with RVR He is asymptomatic, contributing to CHF symptoms -Would likely be unable to restore normal sinus rhythm given cardiomyopathy, as well as RV failure, medication noncompliance -Heparin had been recommended but he has pulled all his IVs out and declining oral medication  Transaminitis In the setting of acute systolic CHF Likely secondary to hepatic congestion  Smoker/COPD Smoking cessation recommended, may need nicotine  patch  Right foot blistering/skin breakdown Need wound dressing, high risk for cellulitis   For questions or updates, please contact [facility] Please consult [email or web address] for contact info under        Signed, Julien Nordmann, MD  07/12/2023, 6:04 PM

## 2023-07-12 NOTE — Progress Notes (Signed)
Patient requested to be "packed into bed "by this sitter- sitter assisted patient with request. Patient pressed called bell multiple times and when asked what was needed  by front desk receptionist patient does not respond but by only  grunting and moaning. This sitter asked patient multiple times what was needed still no response, so this sitter asked if patient was in any pain and if patient needed pain medication. Patient finally responded with words stating "Yes".

## 2023-07-12 NOTE — Progress Notes (Signed)
Patient agreeable to taking medications with the encouragement from sisters. Sisters requested if any further issues with patient not taking medications, to contact them. Please call Judeth Cornfield first- [phone], if unable to reach her, contact Steward Drone [phone]

## 2023-07-12 NOTE — Progress Notes (Signed)
Patient refusing vitals, heparin drip, medications, and IV placement. Manuela Schwartz NP notified about patients refusals.

## 2023-07-13 DIAGNOSIS — I10 Essential (primary) hypertension: Secondary | ICD-10-CM | POA: Diagnosis not present

## 2023-07-13 DIAGNOSIS — E785 Hyperlipidemia, unspecified: Secondary | ICD-10-CM

## 2023-07-13 DIAGNOSIS — I509 Heart failure, unspecified: Secondary | ICD-10-CM

## 2023-07-13 DIAGNOSIS — Z91148 Patient's other noncompliance with medication regimen for other reason: Secondary | ICD-10-CM

## 2023-07-13 DIAGNOSIS — I5043 Acute on chronic combined systolic (congestive) and diastolic (congestive) heart failure: Secondary | ICD-10-CM | POA: Diagnosis not present

## 2023-07-13 DIAGNOSIS — R7401 Elevation of levels of liver transaminase levels: Secondary | ICD-10-CM | POA: Diagnosis not present

## 2023-07-13 DIAGNOSIS — R600 Localized edema: Secondary | ICD-10-CM | POA: Diagnosis not present

## 2023-07-13 LAB — BASIC METABOLIC PANEL
Anion gap: 9 (ref 5–15)
BUN: 25 mg/dL — ABNORMAL HIGH (ref 8–23)
CO2: 30 mmol/L (ref 22–32)
Calcium: 8 mg/dL — ABNORMAL LOW (ref 8.9–10.3)
Chloride: 96 mmol/L — ABNORMAL LOW (ref 98–111)
Creatinine, Ser: 1.23 mg/dL (ref 0.61–1.24)
GFR, Estimated: >60 mL/min (ref >60)
Glucose, Bld: 105 mg/dL — ABNORMAL HIGH (ref 70–99)
Potassium: 3.8 mmol/L (ref 3.5–5.1)
Sodium: 135 mmol/L (ref 135–145)

## 2023-07-13 LAB — CBC
HCT: 42.6 % (ref 39.0–52.0)
Hemoglobin: 14 g/dL (ref 13.0–17.0)
MCH: 33.4 pg (ref 26.0–34.0)
MCHC: 32.9 g/dL (ref 30.0–36.0)
MCV: 101.7 fL — ABNORMAL HIGH (ref 80.0–100.0)
Platelets: 204 10*3/uL (ref 150–400)
RBC: 4.19 MIL/uL — ABNORMAL LOW (ref 4.22–5.81)
RDW: 15.7 % — ABNORMAL HIGH (ref 11.5–15.5)
WBC: 9.2 10*3/uL (ref 4.0–10.5)
nRBC: 0 % (ref 0.0–0.2)

## 2023-07-13 LAB — HEPARIN LEVEL (UNFRACTIONATED)
Heparin Unfractionated: <0.1 [IU]/mL — ABNORMAL LOW (ref 0.30–0.70)
Heparin Unfractionated: <0.1 [IU]/mL — ABNORMAL LOW (ref 0.30–0.70)
Heparin Unfractionated: <0.1 [IU]/mL — ABNORMAL LOW (ref 0.30–0.70)

## 2023-07-13 LAB — MAGNESIUM: Magnesium: 1.9 mg/dL (ref 1.7–2.4)

## 2023-07-13 MED ORDER — POTASSIUM CHLORIDE CRYS ER 20 MEQ PO TBCR
20.0000 meq | EXTENDED_RELEASE_TABLET | Freq: Two times a day (BID) | ORAL | Status: AC
  Administered 2023-07-13 (×2): 20 meq via ORAL
  Filled 2023-07-13 (×2): qty 1

## 2023-07-13 MED ORDER — HEPARIN BOLUS VIA INFUSION
4000.0000 [IU] | INTRAVENOUS | Status: AC
  Administered 2023-07-13: 4000 [IU] via INTRAVENOUS
  Filled 2023-07-13: qty 4000

## 2023-07-13 MED ORDER — DAPAGLIFLOZIN PROPANEDIOL 10 MG PO TABS
10.0000 mg | ORAL_TABLET | Freq: Every day | ORAL | Status: DC
  Administered 2023-07-13 – 2023-07-31 (×19): 10 mg via ORAL
  Filled 2023-07-13 (×19): qty 1

## 2023-07-13 MED ORDER — FUROSEMIDE 10 MG/ML IJ SOLN
40.0000 mg | Freq: Two times a day (BID) | INTRAMUSCULAR | Status: DC
  Administered 2023-07-13 – 2023-07-14 (×3): 40 mg via INTRAVENOUS
  Filled 2023-07-13 (×3): qty 4

## 2023-07-13 NOTE — Discharge Instructions (Addendum)
Some PCP options in [place] area- not a comprehensive list  [facility]- [phone] [facility]- [phone] [facility]- [phone] [facility]- [phone] [facility]- [phone] Lutricia Horsfall- [phone]  or [facility] Physician Referral Line [phone]   Transportation Resources  Agency Name: [facility] Agency Address: [address] [place], [place] [address] Phone: [phone] Email: [email or web address] Website: [email or web address] Service(s) Offered: Housing services, self-sufficiency, congregate meal program, weatherization program, Field seismologist program, emergency food assistance,  housing counseling, home ownership program, wheels-towork program.  Agency Name: [facility] [employer] [phone]) Address: [address] [place], [place] [address] Phone: [phone] Website: [email or web address] Service(s) Offered: Transportation for [employer], subscription and demand response; Dial-a-Ride for citizens 25 years of age or older.  Agency Name: Department of Social Services Address: [address] [place], [place] [address] Phone: [phone] Service(s) Offered: Child support services; child welfare services; food stamps; Medicaid; work first family assistance; and aid with fuel,  rent, food and medicine, transportation assistance.  Agency Name: Disabled [employer] (DAV) Transportation  Network Phone: [phone] Service(s) Offered: Transports veterans to the [facility] medical center. Call  forty-eight hours in advance and leave the name, telephone  number, date, and time of appointment. Veteran will be  contacted by the driver the day before the appointment to  arrange a pick up point   Transportation Resources  Agency Name: [facility] Agency Address: [address] [place], [place] [address] Phone: [phone] Email:  [email or web address] Website: [email or web address] Service(s) Offered: Housing services, self-sufficiency, congregate meal program, weatherization program, Field seismologist program, emergency food assistance,  housing counseling, home ownership program, wheels-towork program.  Agency Name: [facility] [employer] [phone]) Address: [address] [place], [place] [address] Phone: [phone] Website: [email or web address] Service(s) Offered: Transportation for [employer], subscription and demand response; Dial-a-Ride for citizens 72 years of age or older.  Agency Name: Department of Social Services Address: [address] [place], [place] [address] Phone: [phone] Service(s) Offered: Child support services; child welfare services; food stamps; Medicaid; work first family assistance; and aid with fuel,  rent, food and medicine, transportation assistance.  Agency Name: Disabled [employer] (DAV) Transportation  Network Phone: [phone] Service(s) Offered: Transports veterans to the [facility] medical center. Call  forty-eight hours in advance and leave the name, telephone  number, date, and time of appointment. Veteran will be  contacted by the driver the day before the appointment to  arrange a pick up point    [employer] currently provides door to door services. [employer] connects with [employer] daily for services to [facility]. [employer] also performs contract services to [employer] operates 27 vehicles, all but 3 mini-vans are equipped with lifts for special needs as well as the general public. [employer] drivers are each CDL certified and trained in First Aid and CPR. [employer] was established in 2002 by [employer]. An independent Industrial/product designer. [employer] operates via Cytogeneticist with required Research scientist (physical sciences) from [place]. [employer] provides over 80,000 passenger trips each year, including [facility] and [employer] sites.  Call at least by 11 AM one business day prior to needing transportation  [employer].                      [place], [place] [address]     Office Hours: Monday-Friday  8 AM - 5 PM

## 2023-07-13 NOTE — Progress Notes (Signed)
Cardiology Progress Note   Patient Name: Kevin [name] Date of Encounter: 07/13/2023  Primary Cardiologist: Debbe Odea, MD  Subjective   Patient with confusion yesterday though more lucid today and agreeable to treatment.  Now has IV in place and taking oral medications as well.  Denies chest pain or dyspnea.  Ongoing swelling. Objective   Inpatient Medications    Scheduled Meds:  carvedilol  6.25 mg Oral BID WC   cephALEXin  500 mg Oral Q8H   furosemide  40 mg Intravenous BID   gabapentin  300 mg Oral BID   potassium chloride  20 mEq Oral BID   pravastatin  20 mg Oral q1800   sacubitril-valsartan  1 tablet Oral BID   spironolactone  25 mg Oral Daily   Continuous Infusions:  heparin 1,100 Units/hr (07/13/23 1054)   PRN Meds: acetaminophen **OR** acetaminophen, guaiFENesin, hydrALAZINE, ipratropium-albuterol, magnesium hydroxide, ondansetron **OR** ondansetron (ZOFRAN) IV, oxyCODONE-acetaminophen, senna-docusate, traZODone   Vital Signs    Vitals:   07/13/23 0011 07/13/23 0340 07/13/23 0342 07/13/23 0718  BP: 94/65 95/69 96/66  104/88  Pulse:    92  Resp: 19 17  19   Temp: (!) 97.5 F (36.4 C) 97.8 F (36.6 C)  (!) 97.5 F (36.4 C)  TempSrc: Oral   Oral  SpO2: 100% 100%  98%  Weight:      Height:        Intake/Output Summary (Last 24 hours) at 07/13/2023 1257 Last data filed at 07/13/2023 1146 Gross per 24 hour  Intake 360 ml  Output 1500 ml  Net -1140 ml   Filed Weights   07/11/23 0224 07/11/23 1200  Weight: 78.9 kg 78.9 kg    Physical Exam   GEN: Well nourished, well developed, in no acute distress.  HEENT: Grossly normal.  Neck: Supple, JVD to jaw, no carotid bruits or masses. Cardiac: IR IR, no murmurs, rubs, or gallops. No clubbing, cyanosis, 3+ bilat LE edema - lower legs wrapped.  Radials 2+, DP/PT 2+ and equal bilaterally.  Respiratory:  Respirations regular and unlabored, diminished breath sounds at bilateral bases. GI: Soft, nontender,  nondistended, BS + x 4. MS: no deformity or atrophy. Skin: warm and dry, no rash. Neuro:  Strength and sensation are intact. Psych: AAOx3.  Normal affect.  Labs    Chemistry Recent Labs  Lab 07/10/23 1659 07/11/23 0518 07/13/23 0425  NA 137 140 135  K 3.4* 3.1* 3.8  CL 99 101 96*  CO2 29 26 30   GLUCOSE 92 144* 105*  BUN 33* 29* 25*  CREATININE 1.23 1.09 1.23  CALCIUM 8.9 8.1* 8.0*  PROT 7.2  --   --   ALBUMIN 3.7  --   --   AST 190*  --   --   ALT 287*  --   --   ALKPHOS 242*  --   --   BILITOT 4.6*  --   --   GFRNONAA >60 >60 >60  ANIONGAP 9 13 9      Hematology Recent Labs  Lab 07/11/23 0518 07/12/23 1612 07/13/23 0425  WBC 10.3 10.1 9.2  RBC 3.72* 3.84* 4.19*  HGB 12.4* 12.7* 14.0  HCT 37.7* 38.7* 42.6  MCV 101.3* 100.8* 101.7*  MCH 33.3 33.1 33.4  MCHC 32.9 32.8 32.9  RDW 16.0* 15.7* 15.7*  PLT 210 190 204    Cardiac Enzymes  Recent Labs  Lab 07/10/23 2230 07/11/23 0043  TROPONINIHS 114* 125*      BNP    Component Value  Date/Time   BNP 2,153.9 (H) 07/10/2023 1700    Lipids  Lab Results  Component Value Date   CHOL 193 10/04/2020   HDL 46 10/04/2020   LDLCALC 131 (H) 10/04/2020   TRIG 88 10/04/2020   CHOLHDL 4.2 10/04/2020    HbA1c  Lab Results  Component Value Date   HGBA1C 6.0 (H) 10/04/2020   Lab Results  Component Value Date   INR 1.6 (H) 07/11/2023   INR 1.0 07/01/2020    Radiology    DG Chest 2 View  Result Date: 07/10/2023 CLINICAL DATA:  Bilateral feet swelling for the past couple weeks. EXAM: CHEST - 2 VIEW COMPARISON:  Radiograph 06/12/2020 FINDINGS: Stable cardiomegaly. Aortic atherosclerotic calcification. No focal consolidation, pleural effusion, or pneumothorax. No displaced rib fractures. IMPRESSION: No acute cardiopulmonary disease.  Cardiomegaly. Electronically Signed   By: Minerva Fester M.D.   On: 07/10/2023 23:27     Telemetry    Atrial flutter 90's to 130's - Personally Reviewed  Cardiac Studies    2D Echocardiogram 11.16.2024  1. Left ventricular ejection fraction, by estimation, is 20 to 25%. Left  ventricular ejection fraction by 3D volume is 20 %. The left ventricle has  severely decreased function. The left ventricle has no regional wall  motion abnormalities. There is  moderate left ventricular hypertrophy. Left ventricular diastolic  parameters are indeterminate. The average left ventricular global  longitudinal strain is -6.3 %.   2. Right ventricular systolic function is moderately reduced. The right  ventricular size is normal. There is normal pulmonary artery systolic  pressure. The estimated right ventricular systolic pressure is 35.9 mmHg.   3. Left atrial size was moderately dilated.   4. Right atrial size was moderately dilated.   5. The mitral valve is normal in structure. Mild mitral valve  regurgitation. No evidence of mitral stenosis.   6. Tricuspid valve regurgitation is moderate to severe.   7. The aortic valve is tricuspid. Aortic valve regurgitation is not  visualized. No aortic stenosis is present.   8. Severely dilated pulmonary artery.   9. The inferior vena cava is normal in size with greater than 50%  respiratory variability, suggesting right atrial pressure of 3 mmHg.  _____________   Patient Profile     67 y.o. male with a history of NICM, HFimpEF, HTN, DM, tob abuse, and COPD, admitted November 14 for progressive heart failure.  EF back down to 20-25% by echo this admission.  Assessment & Plan    1.  Acute on chronic HFrEF/nonischemic cardiomyopathy/demand ischemia: Prior history of nonischemic cardiomyopathy (apparently had catheterization in [place] in the past) and slight recovery in EF to 45 to 50% in October 2021.  Presented November 14 with progressive lower extremity edema and found to be volume overloaded with transaminitis, and in atrial flutter.  Repeat echo with an EF of 20 to 25% with moderately reduced RV function and RVSP of 35.9  mmHg.  Moderate to severe TR noted.  He initially refused medications yesterday but then agreed to take oral torsemide.  He is more cooperative and oriented today.  Remains volume overloaded.  Transition back to intravenous Lasix.  Continue beta-blocker, Entresto, and spironolactone.  Add [place].  Will ask advanced heart failure to see tomorrow.  2.  Atrial flutter with rapid ventricular response: Rates improved now that he is taking carvedilol.  Now has IV back in place.  Resume heparin.  Plan for TEE guided cardioversion prior to discharge.  3.  Transaminitis: LFTs  elevated in the setting of volume overload.  No repeat since admission.  Will follow-up lfts and INR.  Suspect passive congestion related to heart failure.  4.  Primary hypertension: Hypertensive on arrival but now stable to soft with resumption of GDMT.  Follow.  5.  Type 2 diabetes mellitus: Previous A1c as high as 7.2 in 2020, though he was 6.0 in February 2022.  Not currently requiring sliding scale with mildly elevated fasting glucose of 105 this morning.  Follow-up A1c.  Will add SGLT2 inhibitor in the setting of above.  6.  Tobacco abuse/COPD: Was smoking less than half a pack a day previously.  Cessation advised.  7.  Right foot cellulitis: In the setting of significant edema.  Cephalosporin therapy per medicine team.  8.  Hypokalemia: Potassium stable at 3.8 following addition of spironolactone.  9.  Hyperlipidemia: Continue statin therapy.    10.  Altered mental status/agitation: Patient not cooperative throughout the day on the 16th.  More lucid and cooperative today.  Follow.  Signed, Nicolasa Ducking, NP  07/13/2023, 12:57 PM    For questions or updates, please contact   Please consult [email or web address] for contact info under Cardiology/STEMI.

## 2023-07-13 NOTE — Progress Notes (Signed)
PROGRESS NOTE    Kevin [name]  ZOX:[id number] DOB: [birth year] DOA: 07/10/2023 PCP: Pcp, No      Brief Narrative:   67 year old with CHF with reduced EF, COPD, DM 2, HTN comes to the ED with worsening dyspnea on exertion and lower extremity swelling.  Patient was found to be in CHF exacerbation started on IV diuretics.  Repeat echo shows EF 25%.  Occasionally declining medication but family is helpful. GDMT and diuresis per cardiology.   Assessment & Plan:  Principal Problem:   Acute on chronic combined systolic and diastolic CHF (congestive heart failure) (HCC) Active Problems:   Hypokalemia   Essential hypertension   Peripheral neuropathy   Dyslipidemia    Acute on chronic combined systolic and diastolic CHF (congestive heart failure) (HCC), EF 45% Bilateral lower extremity swelling with some blistering Medication noncompliance, previous echo 45% now 25%. - GDMT and diuretics per cardiology. Empiric Keflex for 5 days for possible LE cellulitis. (EOT 11/19)  Atrial flutter with RVR - TSH normal, on heparin drip.  Continue Coreg - May require cardioversion if he does not get back to normal sinus rhythm.    Hypokalemia Repleted as needed   Dyslipidemia -Pravastatin  COPD with tobacco use -As needed bronchodilators    Peripheral neuropathy -Gabapentin   Essential hypertension -Ongoing cardiac medication adjustments  Declining medications occasionally but sisters were helpful.  PT/OT-HH.  Face-to-face completed  DVT prophylaxis: Heparin drip Code Status: Full code code Family Communication:  Met with sisters at Bedside yesterday. Called [facility], no answer today Status is: Inpatient Continue CHF management in the hospital until cleared by cardiology.    Subjective: Doing ok no new complaints.   Examination:  General exam: Appears calm and comfortable  Respiratory system: Clear to auscultation. Respiratory effort normal. Cardiovascular system:  S1 & S2 heard, RRR. No JVD, murmurs, rubs, gallops or clicks.  2+ bilateral lower extremity pitting edema Gastrointestinal system: Abdomen is nondistended, soft and nontender. No organomegaly or masses felt. Normal bowel sounds heard. Central nervous system: Alert and oriented. No focal neurological deficits. Extremities: Symmetric 5 x 5 power. Skin: No rashes, lesions or ulcers Psychiatry: Judgement and insight appear poor                Diet Orders (From admission, onward)     Start     Ordered   07/11/23 0034  Diet Heart Room service appropriate? Yes; Fluid consistency: Thin  Diet effective now       Question Answer Comment  Room service appropriate? Yes   Fluid consistency: Thin      07/11/23 0038            Objective: Vitals:   07/13/23 0011 07/13/23 0340 07/13/23 0342 07/13/23 0718  BP: 94/65 95/69 96/66  104/88  Pulse:    92  Resp: 19 17  19   Temp: (!) 97.5 F (36.4 C) 97.8 F (36.6 C)  (!) 97.5 F (36.4 C)  TempSrc: Oral   Oral  SpO2: 100% 100%  98%  Weight:      Height:        Intake/Output Summary (Last 24 hours) at 07/13/2023 1254 Last data filed at 07/13/2023 1146 Gross per 24 hour  Intake 360 ml  Output 1500 ml  Net -1140 ml   Filed Weights   07/11/23 0224 07/11/23 1200  Weight: 78.9 kg 78.9 kg    Scheduled Meds:  carvedilol  6.25 mg Oral BID WC   cephALEXin  500 mg Oral Q8H  furosemide  40 mg Intravenous BID   gabapentin  300 mg Oral BID   potassium chloride  20 mEq Oral BID   pravastatin  20 mg Oral q1800   sacubitril-valsartan  1 tablet Oral BID   spironolactone  25 mg Oral Daily   Continuous Infusions:  heparin 1,100 Units/hr (07/13/23 1054)    Nutritional status     Body mass index is 26.45 kg/m.  Data Reviewed:   CBC: Recent Labs  Lab 07/10/23 1659 07/11/23 0518 07/12/23 1612 07/13/23 0425  WBC 10.0 10.3 10.1 9.2  NEUTROABS 7.7  --   --   --   HGB 14.3 12.4* 12.7* 14.0  HCT 44.0 37.7* 38.7* 42.6  MCV  103.3* 101.3* 100.8* 101.7*  PLT 235 210 190 204   Basic Metabolic Panel: Recent Labs  Lab 07/10/23 1659 07/11/23 0518 07/12/23 1612 07/13/23 0425  NA 137 140  --  135  K 3.4* 3.1*  --  3.8  CL 99 101  --  96*  CO2 29 26  --  30  GLUCOSE 92 144*  --  105*  BUN 33* 29*  --  25*  CREATININE 1.23 1.09  --  1.23  CALCIUM 8.9 8.1*  --  8.0*  MG  --  2.1 1.9 1.9  PHOS  --  3.2  --   --    GFR: Estimated Creatinine Clearance: 57.2 mL/min (by C-G formula based on SCr of 1.23 mg/dL). Liver Function Tests: Recent Labs  Lab 07/10/23 1659  AST 190*  ALT 287*  ALKPHOS 242*  BILITOT 4.6*  PROT 7.2  ALBUMIN 3.7   Recent Labs  Lab 07/10/23 2212  LIPASE 34   Recent Labs  Lab 07/10/23 2212  AMMONIA 31   Coagulation Profile: Recent Labs  Lab 07/11/23 0323  INR 1.6*   Cardiac Enzymes: No results for input(s): "CKTOTAL", "CKMB", "CKMBINDEX", "TROPONINI" in the last 168 hours. BNP (last 3 results) No results for input(s): "PROBNP" in the last 8760 hours. HbA1C: No results for input(s): "HGBA1C" in the last 72 hours. CBG: No results for input(s): "GLUCAP" in the last 168 hours. Lipid Profile: No results for input(s): "CHOL", "HDL", "LDLCALC", "TRIG", "CHOLHDL", "LDLDIRECT" in the last 72 hours. Thyroid Function Tests: Recent Labs    07/11/23 0518  TSH 4.190   Anemia Panel: Recent Labs    07/11/23 1238  VITAMINB12 1,477*   Sepsis Labs: No results for input(s): "PROCALCITON", "LATICACIDVEN" in the last 168 hours.  No results found for this or any previous visit (from the past 240 hour(s)).       Radiology Studies: ECHOCARDIOGRAM COMPLETE  Result Date: 07/12/2023    ECHOCARDIOGRAM REPORT   Patient Name:   Kevin [name] Date of Exam: 07/12/2023 Medical Rec #:  [id number]   Height:       68.0 in Accession #:    [id number]  Weight:       174.0 lb Date of Birth:  Jul 18, 1956    BSA:          1.926 m Patient Age:    67 years    BP:           118/79 mmHg Patient  Gender: M           HR:           90 bpm. Exam Location:  [facility] Procedure: 2D Echo, 3D Echo and Strain Analysis Indications:     CHF I50.21  History:  Patient has prior history of Echocardiogram examinations, most                  recent 06/12/2020.  Sonographer:     Overton Mam RDCS, FASE Referring Phys:  [id number] Vernetta Honey MANSY Diagnosing Phys: Julien Nordmann MD  Sonographer Comments: Global longitudinal strain was attempted. IMPRESSIONS  1. Left ventricular ejection fraction, by estimation, is 20 to 25%. Left ventricular ejection fraction by 3D volume is 20 %. The left ventricle has severely decreased function. The left ventricle has no regional wall motion abnormalities. There is moderate left ventricular hypertrophy. Left ventricular diastolic parameters are indeterminate. The average left ventricular global longitudinal strain is -6.3 %.  2. Right ventricular systolic function is moderately reduced. The right ventricular size is normal. There is normal pulmonary artery systolic pressure. The estimated right ventricular systolic pressure is 35.9 mmHg.  3. Left atrial size was moderately dilated.  4. Right atrial size was moderately dilated.  5. The mitral valve is normal in structure. Mild mitral valve regurgitation. No evidence of mitral stenosis.  6. Tricuspid valve regurgitation is moderate to severe.  7. The aortic valve is tricuspid. Aortic valve regurgitation is not visualized. No aortic stenosis is present.  8. Severely dilated pulmonary artery.  9. The inferior vena cava is normal in size with greater than 50% respiratory variability, suggesting right atrial pressure of 3 mmHg. FINDINGS  Left Ventricle: Left ventricular ejection fraction, by estimation, is 20 to 25%. Left ventricular ejection fraction by 3D volume is 20 %. The left ventricle has severely decreased function. The left ventricle has no regional wall motion abnormalities. The average left ventricular global longitudinal strain is  -6.3 %. The left ventricular internal cavity size was normal in size. There is moderate left ventricular hypertrophy. Left ventricular diastolic parameters are indeterminate. Right Ventricle: The right ventricular size is normal. No increase in right ventricular wall thickness. Right ventricular systolic function is moderately reduced. There is normal pulmonary artery systolic pressure. The tricuspid regurgitant velocity is 2.78 m/s, and with an assumed right atrial pressure of 5 mmHg, the estimated right ventricular systolic pressure is 35.9 mmHg. Left Atrium: Left atrial size was moderately dilated. Right Atrium: Right atrial size was moderately dilated. Pericardium: There is no evidence of pericardial effusion. Mitral Valve: The mitral valve is normal in structure. Mild mitral valve regurgitation. No evidence of mitral valve stenosis. Tricuspid Valve: The tricuspid valve is normal in structure. Tricuspid valve regurgitation is moderate to severe. No evidence of tricuspid stenosis. Aortic Valve: The aortic valve is tricuspid. Aortic valve regurgitation is not visualized. No aortic stenosis is present. Aortic valve peak gradient measures 5.5 mmHg. Pulmonic Valve: The pulmonic valve was normal in structure. Pulmonic valve regurgitation is not visualized. No evidence of pulmonic stenosis. Aorta: The aortic root is normal in size and structure. Pulmonary Artery: The pulmonary artery is severely dilated. Venous: The inferior vena cava is normal in size with greater than 50% respiratory variability, suggesting right atrial pressure of 3 mmHg. IAS/Shunts: No atrial level shunt detected by color flow Doppler.  LEFT VENTRICLE PLAX 2D LVIDd:         5.40 cm         Diastology LVIDs:         4.80 cm         LV e' medial:    5.11 cm/s LV PW:         1.30 cm         LV E/e'  medial:  16.8 LV IVS:        1.40 cm         LV e' lateral:   7.94 cm/s LVOT diam:     2.00 cm         LV E/e' lateral: 10.8 LV SV:         34 LV SV Index:    18              2D LVOT Area:     3.14 cm        Longitudinal                                Strain                                2D Strain GLS  -6.3 % LV Volumes (MOD)               Avg: LV vol d, MOD    148.0 ml A2C:                           3D Volume EF LV vol d, MOD    106.0 ml      LV 3D EF:    Left A4C:                                        ventricul LV vol s, MOD    127.0 ml                   ar A2C:                                        ejection LV vol s, MOD    79.3 ml                    fraction A4C:                                        by 3D LV SV MOD A2C:   21.0 ml                    volume is LV SV MOD A4C:   106.0 ml                   20 %. LV SV MOD BP:    25.4 ml                                 3D Volume EF:                                3D EF:        20 %                                LV  EDV:       188 ml                                LV ESV:       151 ml                                LV SV:        37 ml RIGHT VENTRICLE RV Basal diam:  3.80 cm RV S prime:     12.10 cm/s TAPSE (M-mode): 1.4 cm LEFT ATRIUM             Index        RIGHT ATRIUM           Index LA diam:        4.20 cm 2.18 cm/m   RA Area:     32.70 cm LA Vol (A2C):   98.7 ml 51.24 ml/m  RA Volume:   125.00 ml 64.89 ml/m LA Vol (A4C):   52.5 ml 27.25 ml/m LA Biplane Vol: 75.0 ml 38.93 ml/m  AORTIC VALVE                 PULMONIC VALVE AV Area (Vmax): 2.08 cm     PV Vmax:       0.74 m/s AV Vmax:        117.00 cm/s  PV Peak grad:  2.2 mmHg AV Peak Grad:   5.5 mmHg LVOT Vmax:      77.40 cm/s LVOT Vmean:     51.500 cm/s LVOT VTI:       0.109 m                              PULMONARY ARTERY AORTA                        MPA diam:        2.90 cm Ao Root diam: 3.50 cm Ao Asc diam:  3.20 cm MITRAL VALVE               TRICUSPID VALVE MV Area (PHT): 4.17 cm    TR Peak grad:   30.9 mmHg MV Decel Time: 182 msec    TR Vmax:        278.00 cm/s MV E velocity: 85.90 cm/s                            SHUNTS                             Systemic VTI:  0.11 m                            Systemic Diam: 2.00 cm Julien Nordmann MD Electronically signed by Julien Nordmann MD Signature Date/Time: 07/12/2023/4:04:37 PM    Final            LOS: 3 days   Time spent= 35 mins    Miguel Rota, MD [facility]  If 7PM-7AM, please contact night-coverage  07/13/2023, 12:54 PM

## 2023-07-13 NOTE — Progress Notes (Signed)
PT Cancellation Note  Patient Details Name: Kevin [name] MRN: [id number] DOB: 05/18/1956   Cancelled Treatment:    Reason Eval/Treat Not Completed: Other (comment)  Pt stated he recently returned to bed.  Dizziness remains with OOB.  Declined further mobility today.  Will return tomorrow.   Danielle Dess 07/13/2023, 2:52 PM

## 2023-07-13 NOTE — TOC Progression Note (Addendum)
Transition of Care [facility]) - Progression Note    Patient Details  Name: Kevin [name] MRN: [id number] Date of Birth: 28-Mar-1956  Transition of Care [facility]) CM/SW Contact  Bing Quarry, RN Phone Number: 07/13/2023, 9:56 AM  Clinical Narrative: 11/17: Admit 07/10/23 from home via [facility] ED with c/o bilateral swelling of feet for a few weeks,  w/admitting dx of Acute on chronic diastolic/ systolic CHF and Afib RVR. PMH significant for HTN, peripheral neuropathy, dyslipidemia, and COPD.   Prior Level of Function: Independent/Modified Independent per PT evaluation with notes that patient would ambulate 2-3 miles for a store run.   Home situation: From home, lives with a parent in a one level apartment with one level entry per PT notes.   WOUND: Right foot blistering/skin breakdown. Need wound dressing, high risk for cellulitis per cardiology notes.   HH orders in for PT/OT/RN/HH aide and confirmed HH with [facility] via Kand[facility] Keen.  Was under safety observation but discontinued this am. Psychiatric consult discontinued this am and RN notes indicated patient was A&O this am.  DME: May need RW on discharge.  Has not been ordered yet. NO DC orders in as of 1015 am today.   SDOH: Transportation resources added to AVS, along with PCP list options.   TOC to follow through disposition, please consult TOC or contact TOC for additional needs.   Gabriel Cirri MSN RN CM  Care Management Department.  [place]  [facility] Direct Dial: [phone] Main Office Phone: [phone] Weekends Only            Expected Discharge Plan and Services                                   HH Arranged: RN, PT, OT [facility] Agency: [facility] Date [facility] Agency Contacted: 07/13/23 Time HH Agency Contacted: [phone] Representative spoke with at [facility] Agency: Confirmed with Kandee Keen   Social Determinants of Health (SDOH) Interventions SDOH Screenings   Food Insecurity: No Food Insecurity (07/11/2023)  Housing: Low  Risk  (07/11/2023)  Transportation Needs: Unmet Transportation Needs (07/11/2023)  Utilities: Not At Risk (07/11/2023)  Depression (PHQ2-9): Low Risk  (09/18/2021)  Financial Resource Strain: High Risk (05/06/2022)  Physical Activity: Insufficiently Active (01/20/2020)  Social Connections: Unknown (01/20/2020)  Stress: No Stress Concern Present (08/04/2019)  Tobacco Use: High Risk (07/11/2023)    Readmission Risk Interventions     No data to display

## 2023-07-13 NOTE — Progress Notes (Signed)
ANTICOAGULATION CONSULT NOTE  Pharmacy Consult for heparin infusion Indication: ACS/STEMI  No Known Allergies  Patient Measurements: Height: 5\' 8"  (172.7 cm) Weight: 78.9 kg (173 lb 15.1 oz) IBW/kg (Calculated) : 68.4 Heparin Dosing Weight: 78.9 kg  Vital Signs: Temp: 97.9 F (36.6 C) (11/17 1643) Temp Source: Oral (11/17 0718) BP: 102/72 (11/17 1643) Pulse Rate: 92 (11/17 0718)  Labs: Recent Labs     0000 07/10/23 2230 07/11/23 0043 07/11/23 0323 07/11/23 0518 07/11/23 0838 07/11/23 1530 07/12/23 1612 07/13/23 0425 07/13/23 1044 07/13/23 1657  HGB   < >  --   --   --  12.4*  --   --  12.7* 14.0  --   --   HCT  --   --   --   --  37.7*  --   --  38.7* 42.6  --   --   PLT  --   --   --   --  210  --   --  190 204  --   --   APTT  --   --   --  >200*  --  131*  --   --   --   --   --   LABPROT  --   --   --  18.9*  --   --   --   --   --   --   --   INR  --   --   --  1.6*  --   --   --   --   --   --   --   HEPARINUNFRC  --   --   --   --   --  0.40   < >  --  <0.10* <0.10* <0.10*  CREATININE  --   --   --   --  1.09  --   --   --  1.23  --   --   TROPONINIHS  --  114* 125*  --   --   --   --   --   --   --   --    < > = values in this interval not displayed.    Estimated Creatinine Clearance: 57.2 mL/min (by C-G formula based on SCr of 1.23 mg/dL).   Medical History: Past Medical History:  Diagnosis Date   COPD (chronic obstructive pulmonary disease) (HCC)    Diabetes mellitus without complication (HCC)    Heart failure with mid-range ejection fraction (HCC)    a. 2019 reported neg stress test and nl cors on cath in [facility]; b. 07/2019 Echo: EF 25-30%, Gr2 DD, glob HK. Mod reduced RV fxn w/ volume overload. Mod dil RA. Mild to mod TR. Mod elev PASP; b. 12/2019 Echo: EF 50-55%; c. 05/2020 Echo: EF 45-50%, glob HK, sev LVH, GrII DD, nl RV size/fxn, mildly BAE, mild AoV sclerosis, mildly dilated Ao root.   Hypertension    NICM (nonischemic cardiomyopathy) (HCC)     Noncompliance    Pleural effusion on right    a. 07/2019 Thoracentesis: 300 ml.   Tobacco abuse     Assessment: Pt is a 67 yo male presenting to ED c/o bilateral lower extremity swelling, found with elevated BNP and Troponin I level, trending up. Pharmacy has been consulted to manage heparin continuous infusion. Patient not on chronic anticoagulation prior to admission, per chart review.  Goal of Therapy:  Heparin level 0.3-0.7 units/ml Monitor platelets by anticoagulation protocol: Yes  Monitoring: 11/15 0838 HL 0.40 Therapeutic x 1 11/15 1530 HL 0.30 Therapeutic x 2 (barely) 11/16 0730 heparin drip stopped- pt pulled out IV and now refusing IV 11/17 pt agrees to have heparin drip restarted 11/17 1657 HL < 0.10 See plan  Plan:  Level discussed with RN. Heparin infusion was running at erroneous low rate (1.1 ml/hr instead of 11 ml/hr). Pump changed to appropriate rate at 1750. Continue Heparin infusion at 1100 units per hour Repeat HL 6 hrs after rate change  Serria Sloma Rodriguez-Guzman PharmD, BCPS 07/13/2023 6:28 PM

## 2023-07-13 NOTE — Progress Notes (Signed)
Family unavailable at night to assist and reassure. Pt impulsive constantly getting up at night restless, voided and BM on floor. Constantly redirecting pt. Pt receptive however forgetful. MD made aware and requested Safety sitter if available. SRP, RN

## 2023-07-13 NOTE — Progress Notes (Signed)
Patient alert, oriented and following commands in bed. Sitter at bedside. Per report and assessment, patient no longer demonstrating a need for safety sitter at bedside. This RN spoke with patient's two sisters at bedside and we were all in agreement that a bed alarm will suffice for patient safety at this time. Sitter removed, bed alarm activated on medium setting, bed in lowest position. Patient aware and in agreement with safety protocols in place at this time.

## 2023-07-13 NOTE — Progress Notes (Signed)
ANTICOAGULATION CONSULT NOTE  Pharmacy Consult for heparin infusion Indication: ACS/STEMI  No Known Allergies  Patient Measurements: Height: 5\' 8"  (172.7 cm) Weight: 78.9 kg (173 lb 15.1 oz) IBW/kg (Calculated) : 68.4 Heparin Dosing Weight: 78.9 kg  Vital Signs: Temp: 97.5 F (36.4 C) (11/17 0718) Temp Source: Oral (11/17 0718) BP: 104/88 (11/17 0718) Pulse Rate: 92 (11/17 0718)  Labs: Recent Labs    07/10/23 1659 07/10/23 2230 07/11/23 0043 07/11/23 0323 07/11/23 0518 07/11/23 0838 07/11/23 1530 07/12/23 1612 07/13/23 0425  HGB 14.3  --   --   --  12.4*  --   --  12.7* 14.0  HCT 44.0  --   --   --  37.7*  --   --  38.7* 42.6  PLT 235  --   --   --  210  --   --  190 204  APTT  --   --   --  >200*  --  131*  --   --   --   LABPROT  --   --   --  18.9*  --   --   --   --   --   INR  --   --   --  1.6*  --   --   --   --   --   HEPARINUNFRC  --   --   --   --   --  0.40 0.30  --  <0.10*  CREATININE 1.23  --   --   --  1.09  --   --   --  1.23  TROPONINIHS  --  114* 125*  --   --   --   --   --   --     Estimated Creatinine Clearance: 57.2 mL/min (by C-G formula based on SCr of 1.23 mg/dL).   Medical History: Past Medical History:  Diagnosis Date   COPD (chronic obstructive pulmonary disease) (HCC)    Diabetes mellitus without complication (HCC)    Heart failure with mid-range ejection fraction (HCC)    a. 2019 reported neg stress test and nl cors on cath in [facility]; b. 07/2019 Echo: EF 25-30%, Gr2 DD, glob HK. Mod reduced RV fxn w/ volume overload. Mod dil RA. Mild to mod TR. Mod elev PASP; b. 12/2019 Echo: EF 50-55%; c. 05/2020 Echo: EF 45-50%, glob HK, sev LVH, GrII DD, nl RV size/fxn, mildly BAE, mild AoV sclerosis, mildly dilated Ao root.   Hypertension    NICM (nonischemic cardiomyopathy) (HCC)    Noncompliance    Pleural effusion on right    a. 07/2019 Thoracentesis: 300 ml.   Tobacco abuse     Assessment: Pt is a 67 yo male presenting to ED c/o  bilateral lower extremity swelling, found with elevated BNP and Troponin I level, trending up. Pharmacy has been consulted to manage heparin continuous infusion. Patient not on chronic anticoagulation prior to admission, per chart review.  Goal of Therapy:  Heparin level 0.3-0.7 units/ml Monitor platelets by anticoagulation protocol: Yes  Monitoring: 11/15 0838 HL 0.40 Therapeutic x 1 11/15 1530 HL 0.30 Therapeutic x 2 (barely) 11/16 0730 heparin drip stopped- pt pulled out IV and now refusing IV 11/17 pt agrees to have heparin drip restarted    Plan:  Will order Heparin 4000 units x 1 for bolus and restart heparin infusion at 1100 units/hour Check HL in 6 hrs from start of drip Continue to monitor CBC daily while on heparin infusion.  Thank you for involving pharmacy in this patient's care.   Bari Mantis PharmD Clinical Pharmacist 07/13/2023

## 2023-07-13 NOTE — Consult Note (Signed)
PHARMACY CONSULT NOTE - ELECTROLYTES  Pharmacy Consult for Electrolyte Monitoring and Replacement   Recent Labs: Height: 5\' 8"  (172.7 cm) Weight: 78.9 kg (174 lb) IBW/kg (Calculated) : 68.4 Estimated Creatinine Clearance: 57.2 mL/min (by C-G formula based on SCr of 1.23 mg/dL). Potassium (mmol/L)  Date Value  07/13/2023 3.8   Magnesium (mg/dL)  Date Value  81/19/1478 1.9   Calcium (mg/dL)  Date Value  29/56/2130 8.0 (L)   Albumin (g/dL)  Date Value  86/57/8469 3.7  10/04/2020 4.5   Phosphorus (mg/dL)  Date Value  62/95/2841 3.2   Sodium (mmol/L)  Date Value  07/13/2023 135  10/04/2020 141    Assessment  Kevin [name] is a 67 y.o. male presenting with acute onchronic heart failure and atrial flutter. PMH significant for HFimpEF, HTN, DM, COPD, and tobacco use. Pharmacy has been consulted to monitor and replace electrolytes. Patient's renal function currently stable.  Diet: Cardiac MIVF: N/A Pertinent medications: Entresto 97/103mg  BID, Spironolactone 25mg  daily, Torsemide 40mg  po q8h  Goal of Therapy: Electrolytes WNL  Plan:  K 3.8  Will order KCL 20 meq po BID x 2 doses -Patient refusing IVs (pulled it out) Mg 1.9 >> no replacement at this time Check BMP and Mg with AM labs  Thank you for involving pharmacy in this patient's care.   Bari Mantis PharmD Clinical Pharmacist 07/13/2023

## 2023-07-14 ENCOUNTER — Other Ambulatory Visit (HOSPITAL_COMMUNITY): Payer: Self-pay

## 2023-07-14 ENCOUNTER — Encounter: Payer: Self-pay | Admitting: Family Medicine

## 2023-07-14 DIAGNOSIS — I5043 Acute on chronic combined systolic (congestive) and diastolic (congestive) heart failure: Secondary | ICD-10-CM | POA: Diagnosis not present

## 2023-07-14 LAB — CBC
HCT: 38.9 % — ABNORMAL LOW (ref 39.0–52.0)
Hemoglobin: 12.7 g/dL — ABNORMAL LOW (ref 13.0–17.0)
MCH: 33 pg (ref 26.0–34.0)
MCHC: 32.6 g/dL (ref 30.0–36.0)
MCV: 101 fL — ABNORMAL HIGH (ref 80.0–100.0)
Platelets: 175 10*3/uL (ref 150–400)
RBC: 3.85 MIL/uL — ABNORMAL LOW (ref 4.22–5.81)
RDW: 15.5 % (ref 11.5–15.5)
WBC: 9.9 10*3/uL (ref 4.0–10.5)
nRBC: 0 % (ref 0.0–0.2)

## 2023-07-14 LAB — HEMOGLOBIN A1C
Hgb A1c MFr Bld: 6 % — ABNORMAL HIGH (ref 4.8–5.6)
Mean Plasma Glucose: 125.5 mg/dL

## 2023-07-14 LAB — HEPARIN LEVEL (UNFRACTIONATED)
Heparin Unfractionated: <0.1 [IU]/mL — ABNORMAL LOW (ref 0.30–0.70)
Heparin Unfractionated: 0.51 [IU]/mL (ref 0.30–0.70)
Heparin Unfractionated: 0.83 [IU]/mL — ABNORMAL HIGH (ref 0.30–0.70)

## 2023-07-14 LAB — HEPATIC FUNCTION PANEL
ALT: 115 U/L — ABNORMAL HIGH (ref 0–44)
AST: 48 U/L — ABNORMAL HIGH (ref 15–41)
Albumin: 2.4 g/dL — ABNORMAL LOW (ref 3.5–5.0)
Alkaline Phosphatase: 122 U/L (ref 38–126)
Bilirubin, Direct: 0.8 mg/dL — ABNORMAL HIGH (ref 0.0–0.2)
Indirect Bilirubin: 1.1 mg/dL — ABNORMAL HIGH (ref 0.3–0.9)
Total Bilirubin: 1.9 mg/dL — ABNORMAL HIGH (ref <1.2)
Total Protein: 5.3 g/dL — ABNORMAL LOW (ref 6.5–8.1)

## 2023-07-14 LAB — BASIC METABOLIC PANEL
Anion gap: 10 (ref 5–15)
BUN: 33 mg/dL — ABNORMAL HIGH (ref 8–23)
CO2: 33 mmol/L — ABNORMAL HIGH (ref 22–32)
Calcium: 8.3 mg/dL — ABNORMAL LOW (ref 8.9–10.3)
Chloride: 94 mmol/L — ABNORMAL LOW (ref 98–111)
Creatinine, Ser: 1.37 mg/dL — ABNORMAL HIGH (ref 0.61–1.24)
GFR, Estimated: 57 mL/min — ABNORMAL LOW (ref >60)
Glucose, Bld: 106 mg/dL — ABNORMAL HIGH (ref 70–99)
Potassium: 3.3 mmol/L — ABNORMAL LOW (ref 3.5–5.1)
Sodium: 137 mmol/L (ref 135–145)

## 2023-07-14 LAB — OSMOLALITY: Osmolality: 291 mosm/kg (ref 275–295)

## 2023-07-14 LAB — BRAIN NATRIURETIC PEPTIDE: B Natriuretic Peptide: 1517.1 pg/mL — ABNORMAL HIGH (ref 0.0–100.0)

## 2023-07-14 LAB — IRON AND TIBC
Iron: 54 ug/dL (ref 45–182)
Saturation Ratios: 16 % — ABNORMAL LOW (ref 17.9–39.5)
TIBC: 339 ug/dL (ref 250–450)
UIBC: 285 ug/dL

## 2023-07-14 LAB — FOLATE: Folate: 12.8 ng/mL (ref >5.9)

## 2023-07-14 LAB — PROTIME-INR
INR: 1.3 — ABNORMAL HIGH (ref 0.8–1.2)
Prothrombin Time: 16.6 s — ABNORMAL HIGH (ref 11.4–15.2)

## 2023-07-14 LAB — VITAMIN D 25 HYDROXY (VIT D DEFICIENCY, FRACTURES): Vit D, 25-Hydroxy: 21.72 ng/mL — ABNORMAL LOW (ref 30–100)

## 2023-07-14 LAB — PHOSPHORUS: Phosphorus: 2.7 mg/dL (ref 2.5–4.6)

## 2023-07-14 LAB — MAGNESIUM: Magnesium: 1.9 mg/dL (ref 1.7–2.4)

## 2023-07-14 MED ORDER — FUROSEMIDE 10 MG/ML IJ SOLN
80.0000 mg | Freq: Two times a day (BID) | INTRAMUSCULAR | Status: DC
  Administered 2023-07-14 – 2023-07-15 (×3): 80 mg via INTRAVENOUS
  Filled 2023-07-14 (×3): qty 8

## 2023-07-14 MED ORDER — POTASSIUM CHLORIDE CRYS ER 20 MEQ PO TBCR
40.0000 meq | EXTENDED_RELEASE_TABLET | Freq: Two times a day (BID) | ORAL | Status: AC
  Administered 2023-07-14 (×2): 40 meq via ORAL
  Filled 2023-07-14 (×2): qty 2

## 2023-07-14 MED ORDER — HEPARIN BOLUS VIA INFUSION
2400.0000 [IU] | Freq: Once | INTRAVENOUS | Status: AC
  Administered 2023-07-14: 2400 [IU] via INTRAVENOUS
  Filled 2023-07-14: qty 2400

## 2023-07-14 MED ORDER — MAGNESIUM SULFATE 2 GM/50ML IV SOLN
2.0000 g | Freq: Once | INTRAVENOUS | Status: AC
  Administered 2023-07-14: 2 g via INTRAVENOUS
  Filled 2023-07-14: qty 50

## 2023-07-14 NOTE — TOC Benefit Eligibility Note (Signed)
Patient Product/process development scientist completed.    The patient is insured through [employer]. Patient has Medicare and is not eligible for a copay card, but may be able to apply for patient assistance, if available.    Ran test claim for Eliquis 5 mg and the current 30 day co-pay is $0.00.  Ran test claim for Xarelto 20 mg and the current 30 day co-pay is $0.00.   This test claim was processed through [facility]- copay amounts may vary at other pharmacies due to pharmacy/plan contracts, or as the patient moves through the different stages of their insurance plan.     Roland Earl, CPHT Pharmacy Technician III Certified Patient Advocate [facility] Patient Advocate Team Direct Number: [phone]  Fax: [phone]

## 2023-07-14 NOTE — Progress Notes (Signed)
ANTICOAGULATION CONSULT NOTE  Pharmacy Consult for heparin infusion Indication: Aflutter  No Known Allergies  Patient Measurements: Height: 5\' 8"  (172.7 cm) Weight: 74.7 kg (164 lb 10.9 oz) IBW/kg (Calculated) : 68.4 Heparin Dosing Weight: 78.9 kg  Vital Signs: Temp: 98.4 F (36.9 C) (11/18 1620) BP: 93/77 (11/18 1620) Pulse Rate: 73 (11/18 1620)  Labs: Recent Labs    07/12/23 1612 07/13/23 0425 07/13/23 1044 07/14/23 0107 07/14/23 0425 07/14/23 1008 07/14/23 1837  HGB 12.7* 14.0  --   --  12.7*  --   --   HCT 38.7* 42.6  --   --  38.9*  --   --   PLT 190 204  --   --  175  --   --   LABPROT  --   --   --   --  16.6*  --   --   INR  --   --   --   --  1.3*  --   --   HEPARINUNFRC  --  <0.10*   < > <0.10*  --  0.51 0.83*  CREATININE  --  1.23  --   --  1.37*  --   --    < > = values in this interval not displayed.    Estimated Creatinine Clearance: 51.3 mL/min (A) (by C-G formula based on SCr of 1.37 mg/dL (H)).   Medical History: Past Medical History:  Diagnosis Date   COPD (chronic obstructive pulmonary disease) (HCC)    Diabetes mellitus without complication (HCC)    Heart failure with mid-range ejection fraction (HCC)    a. 2019 reported neg stress test and nl cors on cath in [facility]; b. 07/2019 Echo: EF 25-30%, Gr2 DD, glob HK. Mod reduced RV fxn w/ volume overload. Mod dil RA. Mild to mod TR. Mod elev PASP; b. 12/2019 Echo: EF 50-55%; c. 05/2020 Echo: EF 45-50%, glob HK, sev LVH, GrII DD, nl RV size/fxn, mildly BAE, mild AoV sclerosis, mildly dilated Ao root.   Hypertension    NICM (nonischemic cardiomyopathy) (HCC)    Noncompliance    Pleural effusion on right    a. 07/2019 Thoracentesis: 300 ml.   Tobacco abuse    Medications Patient not on chronic anticoagulation prior to admission, per chart review.  Assessment: Pt is a 67 yo male presenting to ED c/o bilateral lower extremity swelling, found with elevated BNP and elevated troponin. Patient also  had Aflutter with rate 128 bpm. Currently diuresing with lasix. Increased lasix from 40 mg BID to 80 mg BID today. Per cardio plan to do TEE once euvolemic. CHAD2DS-VASc score 4 (67-74 yo, CHF hx, HTN, T2DM). Continue to rate control with beta blocker. Pharmacy has been consulted to manage heparin continuous infusion.   Goal of Therapy:  Heparin level 0.3-0.7 units/ml Monitor platelets by anticoagulation protocol: Yes  Monitoring: 11/15 0838 HL 0.40 Therapeutic x 1 11/15 1530 HL 0.30 Therapeutic x 2 (barely) 11/16 0730 heparin drip stopped- pt pulled out IV and now refusing IV 11/17 pt agrees to have heparin drip restarted 11/17 1657 HL < 0.10 See plan 11/18 0107 HL < 0.10 11/18 11/18 HL 0.51 Therapeutic, x1   Plan: heparin level supratherapeutic  reduce heparin infusion rate to 1,200 units per hour Repeat heparin level 6 hours after rate change Daily CBC with AM labs  Lowella Bandy, PharmD 07/14/2023 7:04 PM

## 2023-07-14 NOTE — Progress Notes (Signed)
ANTICOAGULATION CONSULT NOTE  Pharmacy Consult for heparin infusion Indication: ACS/STEMI  No Known Allergies  Patient Measurements: Height: 5\' 8"  (172.7 cm) Weight: 78.9 kg (173 lb 15.1 oz) IBW/kg (Calculated) : 68.4 Heparin Dosing Weight: 78.9 kg  Vital Signs: Temp: 98.1 F (36.7 C) (11/17 2332) Temp Source: Oral (11/17 1941) BP: 115/81 (11/17 2332) Pulse Rate: 61 (11/17 2332)  Labs: Recent Labs    07/11/23 0518 07/11/23 0838 07/11/23 1530 07/12/23 1612 07/13/23 0425 07/13/23 1044 07/13/23 1657 07/14/23 0107  HGB 12.4*  --   --  12.7* 14.0  --   --   --   HCT 37.7*  --   --  38.7* 42.6  --   --   --   PLT 210  --   --  190 204  --   --   --   APTT  --  131*  --   --   --   --   --   --   HEPARINUNFRC  --  0.40   < >  --  <0.10* <0.10* <0.10* <0.10*  CREATININE 1.09  --   --   --  1.23  --   --   --    < > = values in this interval not displayed.    Estimated Creatinine Clearance: 57.2 mL/min (by C-G formula based on SCr of 1.23 mg/dL).   Medical History: Past Medical History:  Diagnosis Date   COPD (chronic obstructive pulmonary disease) (HCC)    Diabetes mellitus without complication (HCC)    Heart failure with mid-range ejection fraction (HCC)    a. 2019 reported neg stress test and nl cors on cath in [facility]; b. 07/2019 Echo: EF 25-30%, Gr2 DD, glob HK. Mod reduced RV fxn w/ volume overload. Mod dil RA. Mild to mod TR. Mod elev PASP; b. 12/2019 Echo: EF 50-55%; c. 05/2020 Echo: EF 45-50%, glob HK, sev LVH, GrII DD, nl RV size/fxn, mildly BAE, mild AoV sclerosis, mildly dilated Ao root.   Hypertension    NICM (nonischemic cardiomyopathy) (HCC)    Noncompliance    Pleural effusion on right    a. 07/2019 Thoracentesis: 300 ml.   Tobacco abuse     Assessment: Pt is a 67 yo male presenting to ED c/o bilateral lower extremity swelling, found with elevated BNP and Troponin I level, trending up. Pharmacy has been consulted to manage heparin continuous  infusion. Patient not on chronic anticoagulation prior to admission, per chart review.  Goal of Therapy:  Heparin level 0.3-0.7 units/ml Monitor platelets by anticoagulation protocol: Yes  Monitoring: 11/15 0838 HL 0.40 Therapeutic x 1 11/15 1530 HL 0.30 Therapeutic x 2 (barely) 11/16 0730 heparin drip stopped- pt pulled out IV and now refusing IV 11/17 pt agrees to have heparin drip restarted 11/17 1657 HL < 0.10 See plan 11/18 0107 HL < 0.10  Plan:  Level discussed with RN. Pt pulled out IV again around 2100 and infusion restarted ~ 1 hour later. Bolus 2400 units x 1 Increase Heparin infusion to 1300 units per hour Repeat HL 6 hrs after rate change  Otelia Sergeant, PharmD, [facility] 07/14/2023 3:29 AM

## 2023-07-14 NOTE — Consult Note (Signed)
Advanced Heart Failure Team Consult Note   Primary Physician: Pcp, No PCP-Cardiologist:  Debbe Odea, MD  Reason for Consultation: Heart Failure   HPI:    Kevin [name] is seen today for evaluation of heart failure at the request of Dr Mariah Milling.   Kevin [name] is a 67 year old with a history of NICM, HFimpEF, HTN, DMII, COPD, and tobacco abuse.   Lives with his mom. He does not drive. He has not been seen by Cardiology in > 1 year. Question about compliance.   Presented to ED CC-  lower extremity edema + orthopnea + PND. Admitted with A/C HFrEF. CXR no acute findings. EKG on arrival A flutter with rate 128 bpm. Pertinent admission labs include: BNP > 2000, HS Trop 114>125, TSH 4.1, mag 2.1, and  creatinine 1.1. Placed on heparin drip.   Echo - EF 20-25%, moderate LVH, no WMA, RV moderately reduced, and  LA/RA moderately dilated. GDMT adjusted.   Diuresing with IV lasix.   Home Medications Prior to Admission medications   Medication Sig Start Date End Date Taking? Authorizing Provider  albuterol (VENTOLIN HFA) 108 (90 Base) MCG/ACT inhaler Inhale 2 puffs into the lungs every 6 (six) hours as needed for wheezing or shortness of breath. 09/08/19  Yes Clarisa [name] A, FNP  amLODipine (NORVASC) 10 MG tablet TAKE ONE TABLET BY MOUTH EVERY DAY 02/28/23 02/28/24 Yes Willy Eddy, MD  carvedilol (COREG) 25 MG tablet TAKE ONE TABLET BY MOUTH 2 TIMES A DAY 02/28/23 02/28/24 Yes Willy Eddy, MD  furosemide (LASIX) 40 MG tablet Take 1 tablet (40 mg total) by mouth daily. 05/01/22  Yes Hackney, Inetta Fermo A, FNP  oxyCODONE-acetaminophen (PERCOCET) 5-325 MG tablet Take 1 tablet by mouth every 4 (four) hours as needed for severe pain. 02/28/23 02/28/24 Yes Willy Eddy, MD  sacubitril-valsartan (ENTRESTO) 97-103 MG Take 1 tablet by mouth 2 (two) times daily. 02/28/23  Yes Willy Eddy, MD  spironolactone (ALDACTONE) 25 MG tablet TAKE ONE TABLET BY MOUTH EVERY DAY 05/01/22 07/11/23 Yes Clarisa [name] A,  FNP  Blood Pressure KIT 1 kit by Does not apply route daily. 12/23/19   Iloabachie, Chioma E, NP  gabapentin (NEURONTIN) 300 MG capsule Take 300 mg by mouth 2 (two) times daily.    [provider]  pravastatin (PRAVACHOL) 20 MG tablet TAKE ONE TABLET BY MOUTH EVERY DAY 02/28/23 02/28/24  Willy Eddy, MD  tiotropium (SPIRIVA) 18 MCG inhalation capsule Place 1 capsule (18 mcg total) into inhaler and inhale daily. Patient not taking: Reported on 05/01/2022 12/23/19   Rolm Gala, NP    Past Medical History: Past Medical History:  Diagnosis Date   COPD (chronic obstructive pulmonary disease) (HCC)    Diabetes mellitus without complication (HCC)    Heart failure with mid-range ejection fraction (HCC)    a. 2019 reported neg stress test and nl cors on cath in [facility]; b. 07/2019 Echo: EF 25-30%, Gr2 DD, glob HK. Mod reduced RV fxn w/ volume overload. Mod dil RA. Mild to mod TR. Mod elev PASP; b. 12/2019 Echo: EF 50-55%; c. 05/2020 Echo: EF 45-50%, glob HK, sev LVH, GrII DD, nl RV size/fxn, mildly BAE, mild AoV sclerosis, mildly dilated Ao root.   Hypertension    NICM (nonischemic cardiomyopathy) (HCC)    Noncompliance    Pleural effusion on right    a. 07/2019 Thoracentesis: 300 ml.   Tobacco abuse     Past Surgical History: History reviewed. No pertinent surgical history.  Family  History: History reviewed. No pertinent family history.  Social History: Social History   Socioeconomic History   Marital status: Single    Spouse name: Not on file   Number of children: 2   Years of education: Not on file   Highest education level: High school graduate  Occupational History   Occupation: unemployed  Tobacco Use   Smoking status: Every Day    Current packs/day: 0.50    Average packs/day: 0.3 packs/day for 24.9 years (7.4 ttl pk-yrs)    Types: Cigarettes    Start date: 2020   Smokeless tobacco: Never   Tobacco comments:    a pack lasts a week (appx. 4 days)  Vaping  Use   Vaping status: Never Used  Substance and Sexual Activity   Alcohol use: Never   Drug use: Not Currently   Sexual activity: Not Currently  Other Topics Concern   Not on file  Social History Narrative   Patient moved to [place] from [place] in March 2020, to take care of his Mother. She lives in a community managed by her church, "independent living for seniors." Patient lives with his Mother, does not drive, and reports he mostly keeps to himself.   Social Determinants of Health   Financial Resource Strain: High Risk (05/06/2022)   Overall Financial Resource Strain (CARDIA)    Difficulty of Paying Living Expenses: Hard  Food Insecurity: No Food Insecurity (07/11/2023)   Hunger Vital Sign    Worried About Running Out of Food in the Last Year: Never true    Ran Out of Food in the Last Year: Never true  Transportation Needs: Unmet Transportation Needs (07/11/2023)   PRAPARE - Transportation    Lack of Transportation (Medical): Yes    Lack of Transportation (Non-Medical): Yes  Physical Activity: Insufficiently Active (01/20/2020)   Exercise Vital Sign    Days of Exercise per Week: 2 days    Minutes of Exercise per Session: 30 min  Stress: No Stress Concern Present (08/04/2019)   [employer] of Occupational Health - Occupational Stress Questionnaire    Feeling of Stress : Not at all  Social Connections: Unknown (01/20/2020)   Social Connection and Isolation Panel [NHANES]    Frequency of Communication with Friends and Family: Once a week    Frequency of Social Gatherings with Friends and Family: Once a week    Attends Religious Services: 1 to 4 times per year    Active Member of Clubs or Organizations: Yes    Attends Banker Meetings: Never    Marital Status: Not on file    Allergies:  No Known Allergies  Objective:    Vital Signs:   Temp:  [97.9 F (36.6 C)-98.5 F (36.9 C)] 98.4 F (36.9 C) (11/18 0819) Pulse Rate:  [43-98] 98 (11/18  0819) Resp:  [16-20] 17 (11/18 0819) BP: (102-122)/(72-92) 122/85 (11/18 0819) SpO2:  [100 %] 100 % (11/18 0819) Last BM Date : 07/12/23  Weight change: Filed Weights   07/11/23 0224 07/11/23 1200  Weight: 78.9 kg 78.9 kg    Intake/Output:   Intake/Output Summary (Last 24 hours) at 07/14/2023 0911 Last data filed at 07/14/2023 0600 Gross per 24 hour  Intake 730 ml  Output 2450 ml  Net -1720 ml      Physical Exam    General: Sitting on the side of the bed. No resp difficulty HEENT: normal Neck: supple. JVP to jaw  . Carotids 2+ bilat; no bruits. No lymphadenopathy or thyromegaly  appreciated. Cor: PMI nondisplaced. Regular rate & rhythm. No rubs, gallops or murmurs. Lungs: clear Abdomen: soft, nontender, nondistended. No hepatosplenomegaly. No bruits or masses. Good bowel sounds. Extremities: no cyanosis, clubbing, rash, R and LLE 3+ edema. Dressing R foot and LLE.  Neuro: alert & orientedx3, cranial nerves grossly intact. moves all 4 extremities w/o difficulty. Affect pleasant   Telemetry   A flutter   EKG    A flutter 128 bpm   Labs   Basic Metabolic Panel: Recent Labs  Lab 07/10/23 1659 07/11/23 0518 07/12/23 1612 07/13/23 0425 07/14/23 0425  NA 137 140  --  135 137  K 3.4* 3.1*  --  3.8 3.3*  CL 99 101  --  96* 94*  CO2 29 26  --  30 33*  GLUCOSE 92 144*  --  105* 106*  BUN 33* 29*  --  25* 33*  CREATININE 1.23 1.09  --  1.23 1.37*  CALCIUM 8.9 8.1*  --  8.0* 8.3*  MG  --  2.1 1.9 1.9 1.9  PHOS  --  3.2  --   --   --     Liver Function Tests: Recent Labs  Lab 07/10/23 1659 07/14/23 0425  AST 190* 48*  ALT 287* 115*  ALKPHOS 242* 122  BILITOT 4.6* 1.9*  PROT 7.2 5.3*  ALBUMIN 3.7 2.4*   Recent Labs  Lab 07/10/23 2212  LIPASE 34   Recent Labs  Lab 07/10/23 2212  AMMONIA 31    CBC: Recent Labs  Lab 07/10/23 1659 07/11/23 0518 07/12/23 1612 07/13/23 0425 07/14/23 0425  WBC 10.0 10.3 10.1 9.2 9.9  NEUTROABS 7.7  --   --    --   --   HGB 14.3 12.4* 12.7* 14.0 12.7*  HCT 44.0 37.7* 38.7* 42.6 38.9*  MCV 103.3* 101.3* 100.8* 101.7* 101.0*  PLT 235 210 190 204 175    Cardiac Enzymes: No results for input(s): "CKTOTAL", "CKMB", "CKMBINDEX", "TROPONINI" in the last 168 hours.  BNP: BNP (last 3 results) Recent Labs    07/10/23 1700  BNP 2,153.9*    ProBNP (last 3 results) No results for input(s): "PROBNP" in the last 8760 hours.   CBG: No results for input(s): "GLUCAP" in the last 168 hours.  Coagulation Studies: Recent Labs    07/14/23 0425  LABPROT 16.6*  INR 1.3*     Imaging   No results found.   Medications:     Current Medications:  carvedilol  6.25 mg Oral BID WC   cephALEXin  500 mg Oral Q8H   dapagliflozin propanediol  10 mg Oral Daily   furosemide  40 mg Intravenous BID   gabapentin  300 mg Oral BID   potassium chloride  40 mEq Oral BID   pravastatin  20 mg Oral q1800   sacubitril-valsartan  1 tablet Oral BID   spironolactone  25 mg Oral Daily    Infusions:  heparin 1,300 Units/hr (07/14/23 0342)   magnesium sulfate bolus IVPB        Patient Profile  Kevin [name] is a 67 year old with a history of NICM, HFimpEF, HTN, DMII, COPD, and tobacco abuse.   Admitted with A/C HFrEF and new onset A fib.    Assessment/Plan   1. A/C HFrEF, NICM  Had cath years ago in [place]. No records.  EF down to 20-25% with moderate LVH. from previous 45-50% in 2021. Ideally would pursue CMRI to assess for amyloid. Check myeloma panel.  On exam marked  volume overload. Increase lasix 80 mg twice a day.  Once diuresed will need TEE/DC-CV.  Continue low dose bb, entresto, farxiga, and spironolactone.  Follow BMET.  Hold off on cath for now.  Needs daily weight and strict I/O.   2. A flutter RVR New  TSH ok. Continue low dose bb.  Continue heparin drip. Set up TEE/DC-CV once diuresed.    3. Lower extremity wounds/Cellulits  WOC following.   4. DMII   5. SDOH Consult  SW. He does not drive.    Length of Stay: 4  Kevin Maskell, NP  07/14/2023, 9:11 AM  Advanced Heart Failure Team Pager [phone] (M-F; 7a - 5p)  Please contact [facility] for night-coverage after hours (4p -7a ) and weekends on [email or web address]

## 2023-07-14 NOTE — Consult Note (Addendum)
PHARMACY CONSULT NOTE - ELECTROLYTES  Pharmacy Consult for Electrolyte Monitoring and Replacement   Recent Labs: Height: 5\' 8"  (172.7 cm) Weight: 78.9 kg (173 lb 15.1 oz) IBW/kg (Calculated) : 68.4 Estimated Creatinine Clearance: 51.3 mL/min (A) (by C-G formula based on SCr of 1.37 mg/dL (H)). Potassium (mmol/L)  Date Value  07/14/2023 3.3 (L)   Magnesium (mg/dL)  Date Value  82/95/6213 1.9   Calcium (mg/dL)  Date Value  08/65/7846 8.3 (L)   Albumin (g/dL)  Date Value  96/29/5284 2.4 (L)  10/04/2020 4.5   Phosphorus (mg/dL)  Date Value  13/24/4010 3.2   Sodium (mmol/L)  Date Value  07/14/2023 137  10/04/2020 141    Assessment  Kevin [name] is a 67 y.o. male presenting with acute on chronic heart failure and atrial flutter. PMH significant for HFimpEF, HTN, DM, COPD, and tobacco use. ECHO 25%. May require cardioversion. Patient previously refused IV. Patient's renal function currently stable. Pharmacy has been consulted to monitor and replace electrolytes.   Scr trending up while on IV lasix.   Diet: Cardiac MIVF: N/A Pertinent medications:  Entresto 97/103mg  BID Spironolactone 25mg  daily Furosemide 40 mg BID IV Farxiga 10 mg daily.   Goal of Therapy: Electrolytes WNL Goal K >4.0; today, 07/14/2023 K = 3.3 Goal Mg > 2.0; today, 07/14/2023 Mg = 1.9  Plan:  Give KCL 40 mEq po BID x 2 doses Mg 2g IV x 1.  Check BMP, Mg with AM labs  Thank you for involving pharmacy in this patient's care.   Ronnald Ramp, PharmD, BCPS 07/14/2023 9:29 AM

## 2023-07-14 NOTE — Plan of Care (Signed)

## 2023-07-14 NOTE — Progress Notes (Addendum)
ANTICOAGULATION CONSULT NOTE  Pharmacy Consult for heparin infusion Indication: Aflutter  No Known Allergies  Patient Measurements: Height: 5\' 8"  (172.7 cm) Weight: 78.9 kg (173 lb 15.1 oz) IBW/kg (Calculated) : 68.4 Heparin Dosing Weight: 78.9 kg  Vital Signs: Temp: 98.4 F (36.9 C) (11/18 0819) BP: 122/85 (11/18 0819) Pulse Rate: 98 (11/18 0819)  Labs: Recent Labs    07/12/23 1612 07/13/23 0425 07/13/23 1044 07/13/23 1657 07/14/23 0107 07/14/23 0425 07/14/23 1008  HGB 12.7* 14.0  --   --   --  12.7*  --   HCT 38.7* 42.6  --   --   --  38.9*  --   PLT 190 204  --   --   --  175  --   LABPROT  --   --   --   --   --  16.6*  --   INR  --   --   --   --   --  1.3*  --   HEPARINUNFRC  --  <0.10*   < > <0.10* <0.10*  --  0.51  CREATININE  --  1.23  --   --   --  1.37*  --    < > = values in this interval not displayed.    Estimated Creatinine Clearance: 51.3 mL/min (A) (by C-G formula based on SCr of 1.37 mg/dL (H)).   Medical History: Past Medical History:  Diagnosis Date   COPD (chronic obstructive pulmonary disease) (HCC)    Diabetes mellitus without complication (HCC)    Heart failure with mid-range ejection fraction (HCC)    a. 2019 reported neg stress test and nl cors on cath in [facility]; b. 07/2019 Echo: EF 25-30%, Gr2 DD, glob HK. Mod reduced RV fxn w/ volume overload. Mod dil RA. Mild to mod TR. Mod elev PASP; b. 12/2019 Echo: EF 50-55%; c. 05/2020 Echo: EF 45-50%, glob HK, sev LVH, GrII DD, nl RV size/fxn, mildly BAE, mild AoV sclerosis, mildly dilated Ao root.   Hypertension    NICM (nonischemic cardiomyopathy) (HCC)    Noncompliance    Pleural effusion on right    a. 07/2019 Thoracentesis: 300 ml.   Tobacco abuse    Medications Patient not on chronic anticoagulation prior to admission, per chart review.  Assessment: Pt is a 67 yo male presenting to ED c/o bilateral lower extremity swelling, found with elevated BNP and elevated troponin. Patient also  had Aflutter with rate 128 bpm. Currently diuresing with lasix. Increased lasix from 40 mg BID to 80 mg BID today. Per cardio plan to do TEE once euvolemic. CHAD2DS-VASc score 4 (71-74 yo, CHF hx, HTN, T2DM). Continue to rate control with beta blocker. Pharmacy has been consulted to manage heparin continuous infusion.   Goal of Therapy:  Heparin level 0.3-0.7 units/ml Monitor platelets by anticoagulation protocol: Yes  Monitoring: 11/15 0838 HL 0.40 Therapeutic x 1 11/15 1530 HL 0.30 Therapeutic x 2 (barely) 11/16 0730 heparin drip stopped- pt pulled out IV and now refusing IV 11/17 pt agrees to have heparin drip restarted 11/17 1657 HL < 0.10 See plan 11/18 0107 HL < 0.10 11/18 11/18 HL 0.51 Therapeutic, x1   Plan:  Continue heparin infusion 1,300 units per hour Per nurse no further IV loss at this time Repeat HL 6 hrs Daily CBC with AM labs  Effie Shy, PharmD Pharmacy Resident  07/14/2023 10:54 AM

## 2023-07-14 NOTE — Consult Note (Signed)
PHARMACY CONSULT NOTE - ELECTROLYTES  Pharmacy Consult for Electrolyte Monitoring and Replacement   Recent Labs: Height: 5\' 8"  (172.7 cm) Weight: 78.9 kg (173 lb 15.1 oz) IBW/kg (Calculated) : 68.4 Estimated Creatinine Clearance: 51.3 mL/min (A) (by C-G formula based on SCr of 1.37 mg/dL (H)). Potassium (mmol/L)  Date Value  07/14/2023 3.3 (L)   Magnesium (mg/dL)  Date Value  02/19/9484 1.9   Calcium (mg/dL)  Date Value  46/27/0350 8.3 (L)   Albumin (g/dL)  Date Value  09/38/1829 2.4 (L)  10/04/2020 4.5   Phosphorus (mg/dL)  Date Value  93/71/6967 3.2   Sodium (mmol/L)  Date Value  07/14/2023 137  10/04/2020 141    Assessment  Kevin [name] is a 67 y.o. male presenting with acute on chronic heart failure and atrial flutter. PMH significant for HFimpEF, HTN, DM, COPD, and tobacco use. ECHO 25%. May require cardioversion. Patient previously refused IV. Patient's renal function currently stable. Pharmacy has been consulted to monitor and replace electrolytes.   Diet: Cardiac MIVF: N/A Pertinent medications:  Entresto 97/103mg  BID Spironolactone 25mg  daily Furosemide 40 mg BID  Goal of Therapy: Electrolytes WNL Goal K >4.0; today, 07/14/2023 K = 3.3 Goal Mg > 2.0; today, 07/14/2023 Mg = 1.9  Plan:  Give KCL 40 mEq po BID x 2 doses Hold off replacing Mg at this time, continue to monitor Check BMP, Mg with AM labs  Thank you for involving pharmacy in this patient's care.   Effie Shy, PharmD Pharmacy Resident  07/14/2023 8:16 AM

## 2023-07-14 NOTE — Progress Notes (Signed)
Occupational Therapy Treatment Patient Details Name: Kevin [name] MRN: [id number] DOB: [birth year] Today's Date: 07/14/2023   History of present illness Pt is 67 y/o admitted 07/10/23 for Acute on chronic combined systolic and diastolic CHF and a-flutter. PMHx includes: HTN, peripheral neuropathy, dyslipidemia, and COPD.   OT comments  Pt is seated on EOB on arrival. Lethargic as he has been restless at night and tired during the day. NT/sitter present as pt needed to use the bathroom. Encourgaed him to walk to the bathroom or use BSC, but pt declined and wished to stand to use urinal at EOB. Required Min A x2 for STS from EOB and CGA to maintain balance while using urinal. Encouraged to perform oral care or bathing at sink, pt declining all stating "I'm not doing that right now, maybe later." Noted with lateral lean as pt dozing off seated at EOB and assisting with return to supine. Min A x2 for STS again to lateral step to [facility] with CGA and return to supine with SUP. He denies pain. Left in bed with all needs in place and will cont to require skilled acute OT services to maximize his safety and IND to return to PLOF. 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       If plan is discharge home, recommend the following:  A little help with walking and/or transfers;A little help with bathing/dressing/bathroom;Help with stairs or ramp for entrance;Assist for transportation;Assistance with cooking/housework   Equipment Recommendations  Other (comment);Tub/shower seat (RW)    Recommendations for Other Services      Precautions / Restrictions Restrictions Weight Bearing Restrictions: No       Mobility Bed Mobility Overal bed mobility: Needs Assistance Bed Mobility: Sit to Supine       Sit to supine: Supervision, HOB elevated, Used rails   General bed mobility comments: seated EOB on entry to room, required SUP and  increased cueing to return to supine at end of session    Transfers Overall transfer level: Needs assistance Equipment used: 1 person hand held assist Transfers: Sit to/from Stand Sit to Stand: Min assist, +2 safety/equipment           General transfer comment: Min a X2  for STS from EOB today as pt is lethargic and not following directions well, refusing any ADL care, etc. performed lateral scooting at EOB with CGA     Balance Overall balance assessment: Needs assistance Sitting-balance support: Feet supported Sitting balance-Leahy Scale: Fair Sitting balance - Comments: steady, however lethargic with posterior lean noted at times Postural control: Posterior lean Standing balance support: During functional activity, Single extremity supported Standing balance-Leahy Scale: Fair Standing balance comment: needs CGA to maintain standing balance from external support                           ADL either performed or assessed with clinical judgement   ADL Overall ADL's : Needs assistance/impaired                             Toileting- Clothing Manipulation and Hygiene: Contact guard assist Toileting - Clothing Manipulation Details (indicate cue type and reason): pt reclining to walk to the bathroom or use BSC, wished to use urinal standing at bedside; required CGA to maintain standing balance during use  Extremity/Trunk Assessment Upper Extremity Assessment Upper Extremity Assessment: Overall WFL for tasks assessed   Lower Extremity Assessment Lower Extremity Assessment: Generalized weakness        Vision       Perception     Praxis      Cognition Arousal: Lethargic Behavior During Therapy: WFL for tasks assessed/performed Overall Cognitive Status: Difficult to assess                                 General Comments: lethargic, seems as if he has been restless at night and now tired this morning         Exercises Other Exercises Other Exercises: Edu on importance of participating with therapy to maximize his strength and prevent further weakness or functional decline.    Shoulder Instructions       General Comments      Pertinent Vitals/ Pain       Pain Assessment Pain Assessment: No/denies pain  Home Living                                          Prior Functioning/Environment              Frequency  Min 1X/week        Progress Toward Goals  OT Goals(current goals can now be found in the care plan section)  Progress towards OT goals: Progressing toward goals  Acute Rehab OT Goals Patient Stated Goal: return home OT Goal Formulation: With patient Time For Goal Achievement: 07/25/23 Potential to Achieve Goals: Good  Plan      Co-evaluation                 AM-PAC OT "6 Clicks" Daily Activity     Outcome Measure   Help from another person eating meals?: None Help from another person taking care of personal grooming?: None Help from another person toileting, which includes using toliet, bedpan, or urinal?: A Little Help from another person bathing (including washing, rinsing, drying)?: A Little Help from another person to put on and taking off regular upper body clothing?: None Help from another person to put on and taking off regular lower body clothing?: A Little 6 Click Score: 21    End of Session    OT Visit Diagnosis: Other abnormalities of gait and mobility (R26.89);Unsteadiness on feet (R26.81)   Activity Tolerance Patient tolerated treatment well   Patient Left in bed;with call bell/phone within reach;with nursing/sitter in room   Nurse Communication Mobility status        Time: 1020-1037 OT Time Calculation (min): 17 min  Charges: OT General Charges $OT Visit: 1 Visit OT Treatments $Self Care/Home Management : 8-22 mins  Imraan Wendell, OTR/L  07/14/23, 12:29 PM   Talayia Hjort E Niyonna Betsill 07/14/2023, 12:29 PM

## 2023-07-14 NOTE — Progress Notes (Signed)
Education Assessment and Provision:  Detailed education and instructions provided on heart failure disease management including the following:  Signs and symptoms of Heart Failure When to call the physician Importance of daily weights Low sodium diet Fluid restriction Medication management Anticipated future follow-up appointments  Patient education given on each of the above topics.  Patient acknowledges understanding via teach back method and acceptance of all instructions.  Education Materials:  "Living Better With Heart Failure" Booklet, HF zone tool, & Daily Weight Tracker Tool.  Patient has scale at home: Yes Patient has pill box at home: No-patient will get one.  Patient HF education completed and understands all information. Scheduled him for a follow-upon 07/23/23 @ 2:00 pm at the [name] after discharge.  Appointment was then cancelled because pt stated, that he would not come to the appointment.   Navigator will sign off at this time.  [name], RN, BSN [facility] Heart Failure Navigator Secure Chat Only

## 2023-07-14 NOTE — Progress Notes (Signed)
PT Cancellation Note  Patient Details Name: Kevin [name] MRN: [id number] DOB: Jul 05, 1956   Cancelled Treatment:    Reason Eval/Treat Not Completed: Patient declined, no reason specified;Fatigue/lethargy limiting ability to participate. Pt received in bed with RN and declined PT session. Pt reports that they are feeling fine and comfortable, therefore does not want to participate in Tx session. Will re-attempt at a later date.  Adalis Gatti Sauvignon Howard SPT, LAT, ATC  Koal Eslinger Sauvignon-Howard 07/14/2023, 10:44 AM

## 2023-07-14 NOTE — Progress Notes (Signed)
[facility] Progress Note  Patient: Kevin [name]    WUX:[id number]  DOA: 07/10/2023     Date of Service: the patient was seen and examined on 07/14/2023  Chief Complaint  Patient presents with   Leg Swelling   Brief hospital course: 67 year old with CHF with reduced EF, COPD, DM 2, HTN comes to the ED with worsening dyspnea on exertion and lower extremity swelling.  Patient was found to be in CHF exacerbation started on IV diuretics.  Repeat echo shows EF 25%.  Occasionally declining medication but family is helpful. GDMT and diuresis per cardiology.    Assessment and Plan:  Principal Problem:   Acute on chronic combined systolic and diastolic CHF (congestive heart failure) (HCC) Active Problems:   Hypokalemia   Essential hypertension   Peripheral neuropathy   Dyslipidemia    Acute on chronic combined systolic and diastolic CHF (congestive heart failure) (HCC), EF 45% Bilateral lower extremity swelling with some blistering Medication noncompliance, previous echo 45% now 25%. - GDMT and diuretics per cardiology. Empiric Keflex for 5 days for possible LE cellulitis. (EOT 11/19) BNP 1517 elevated   Atrial flutter with RVR - TSH normal, on heparin drip.  Continue Coreg - May require cardioversion if he does not get back to normal sinus rhythm.   Elevated creatinine, most likely due to urinary retention In-N-Out cath was done twice as per RN 11/18 patient is refusing Foley catheter insertion Continue to encourage for Foley insertion if unable to void Monitor renal functions daily and urine output   Hypokalemia Repleted as needed   Dyslipidemia -Pravastatin   COPD with tobacco use -As needed bronchodilators     Peripheral neuropathy -Gabapentin   Essential hypertension -Ongoing cardiac medication adjustments   Mild macrocytic anemia  Transferrin saturation 16%, slightly low, folate within normal range.  B12 level 1477 elevated   Declining medications  occasionally but sisters were helpful.   PT/OT-HH.  Face-to-face completed   Body mass index is 25.04 kg/m.  Interventions:  Diet: Heart healthy diet DVT Prophylaxis: Therapeutic Anticoagulation with heparin IV infusion    Advance goals of care discussion: Full code  Family Communication: family was not present at bedside, at the time of interview.  The pt provided permission to discuss medical plan with the family. Opportunity was given to ask question and all questions were answered satisfactorily.   Disposition:  Pt is from home, admitted with CHF, developed atrial flutter, still has tachycardia and on heparin IV infusion, may need cardioversion, which precludes a safe discharge. Discharge to home, when stable, cleared by cardiology, may need few days to improve.  Subjective: No significant events overnight, patient denies any complaints, he refused Foley catheter insertion, patient is trying to void but unsuccessful.  RN was advised to continue to try to encourage for Foley catheter if he is unable to void, no need of In-N-Out catheter, need to keep Foley catheter for 1 week. Patient denied any chest pain or palpitations, no shortness of breath.  Physical Exam: General: NAD, lying comfortably Appear in no distress, affect appropriate Eyes: PERRLA ENT: Oral Mucosa Clear, moist  Neck: no JVD,  Cardiovascular: Irregular rhythm, no Murmur,  Respiratory: good respiratory effort, Bilateral Air entry equal and Decreased, no Crackles, no wheezes Abdomen: Bowel Sound present, Soft and no tenderness,  Skin: no rashes Extremities: 4+ pedal edema, no calf tenderness,  Neurologic: without any new focal findings Gait not checked due to patient safety concerns  Vitals:   07/14/23 0354 07/14/23 0704 07/14/23  0819 07/14/23 1231  BP: (!) 118/92  122/85 109/65  Pulse: (!) 43  98   Resp: 20  17 18   Temp: 98.5 F (36.9 C)  98.4 F (36.9 C) 98.1 F (36.7 C)  TempSrc:      SpO2: 100%   100% 94%  Weight:  74.7 kg    Height:        Intake/Output Summary (Last 24 hours) at 07/14/2023 1542 Last data filed at 07/14/2023 1540 Gross per 24 hour  Intake 1133.27 ml  Output 1750 ml  Net -616.73 ml   Filed Weights   07/11/23 0224 07/11/23 1200 07/14/23 0704  Weight: 78.9 kg 78.9 kg 74.7 kg    Data Reviewed: I have personally reviewed and interpreted daily labs, tele strips, imagings as discussed above. I reviewed all nursing notes, pharmacy notes, vitals, pertinent old records I have discussed plan of care as described above with RN and patient/family.  CBC: Recent Labs  Lab 07/10/23 1659 07/11/23 0518 07/12/23 1612 07/13/23 0425 07/14/23 0425  WBC 10.0 10.3 10.1 9.2 9.9  NEUTROABS 7.7  --   --   --   --   HGB 14.3 12.4* 12.7* 14.0 12.7*  HCT 44.0 37.7* 38.7* 42.6 38.9*  MCV 103.3* 101.3* 100.8* 101.7* 101.0*  PLT 235 210 190 204 175   Basic Metabolic Panel: Recent Labs  Lab 07/10/23 1659 07/11/23 0518 07/12/23 1612 07/13/23 0425 07/14/23 0425 07/14/23 1008  NA 137 140  --  135 137  --   K 3.4* 3.1*  --  3.8 3.3*  --   CL 99 101  --  96* 94*  --   CO2 29 26  --  30 33*  --   GLUCOSE 92 144*  --  105* 106*  --   BUN 33* 29*  --  25* 33*  --   CREATININE 1.23 1.09  --  1.23 1.37*  --   CALCIUM 8.9 8.1*  --  8.0* 8.3*  --   MG  --  2.1 1.9 1.9 1.9  --   PHOS  --  3.2  --   --   --  2.7    Studies: No results found.  Scheduled Meds:  carvedilol  6.25 mg Oral BID WC   cephALEXin  500 mg Oral Q8H   dapagliflozin propanediol  10 mg Oral Daily   furosemide  80 mg Intravenous BID   gabapentin  300 mg Oral BID   potassium chloride  40 mEq Oral BID   pravastatin  20 mg Oral q1800   sacubitril-valsartan  1 tablet Oral BID   spironolactone  25 mg Oral Daily   Continuous Infusions:  heparin 1,300 Units/hr (07/14/23 1035)   PRN Meds: acetaminophen **OR** acetaminophen, guaiFENesin, hydrALAZINE, ipratropium-albuterol, magnesium hydroxide, ondansetron  **OR** ondansetron (ZOFRAN) IV, oxyCODONE-acetaminophen, senna-docusate, traZODone  Time spent: 35 minutes  Author: Gillis Santa. MD [facility] 07/14/2023 3:42 PM  To reach On-call, see care teams to locate the attending and reach out to them via [email or web address]. If 7PM-7AM, please contact night-coverage If you still have difficulty reaching the attending provider, please page the [facility] (Director on Call) for [facility] on amion for assistance.

## 2023-07-14 NOTE — Consult Note (Signed)
WOC Nurse Consult Note: Reason for Consult: bilateral foot edema and sloughing History CHF/COPD/HTN. Followed by cardiology/CHF clinic  Wound type: partial thickness skin loss right dorsal foot related to edema Pressure Injury POA: NA Measurement: see nursing flow sheets Wound bed: pink, clean Drainage (amount, consistency, odor) weeping Periwound: edema Dressing procedure/placement/frequency: Cover open area with single layer of xeroform gauze, top with ABD pads, wrap from toes to knees with kerlix and 4"ACE wrap.  Wrap bilateral LEs this way. Important to wrap from toes to knees.   FU with outpatient wound care center or MD for consideration for compression needs long term. Would not compress inpatient without ABIs    Re consult if needed, will not follow at this time. Thanks  Megann Easterwood [employer], RN,CWOCN, CNS, CWON-AP [phone])

## 2023-07-14 NOTE — TOC Progression Note (Signed)
Transition of Care [facility]) - Progression Note    Patient Details  Name: Kevin [name] MRN: [id number] Date of Birth: 1955/09/03  [facility] CM/SW Contact  Truddie Hidden, RN Phone Number: 07/14/2023, 9:20 AM  Clinical Narrative:    TOC continuing to follow patient's progress throughout discharge planning.        Expected Discharge Plan and Services                                   HH Arranged: RN, PT, OT [facility] Agency: [facility] Date [facility] Agency Contacted: 07/13/23 Time HH Agency Contacted: [phone] Representative spoke with at [facility] Agency: Confirmed with Kandee Keen   Social Determinants of Health (SDOH) Interventions SDOH Screenings   Food Insecurity: No Food Insecurity (07/11/2023)  Housing: Low Risk  (07/11/2023)  Transportation Needs: Unmet Transportation Needs (07/11/2023)  Utilities: Not At Risk (07/11/2023)  Depression (PHQ2-9): Low Risk  (09/18/2021)  Financial Resource Strain: High Risk (05/06/2022)  Physical Activity: Insufficiently Active (01/20/2020)  Social Connections: Unknown (01/20/2020)  Stress: No Stress Concern Present (08/04/2019)  Tobacco Use: High Risk (07/11/2023)    Readmission Risk Interventions     No data to display

## 2023-07-14 NOTE — TOC Progression Note (Signed)
Transition of Care [facility]) - Progression Note    Patient Details  Name: Kevin [name] MRN: [id number] Date of Birth: [birth year]  Transition of Care [facility]) CM/SW Contact  Truddie Hidden, RN Phone Number: 07/14/2023, 2:56 PM  Clinical Narrative:    Spoke with patient at bedside regarding discharge plan. He declined to receive RW.         Expected Discharge Plan and Services                                   HH Arranged: RN, PT, OT [facility] Agency: [facility] Date [facility] Agency Contacted: 07/13/23 Time HH Agency Contacted: [phone] Representative spoke with at [facility] Agency: Confirmed with Kandee Keen   Social Determinants of Health (SDOH) Interventions SDOH Screenings   Food Insecurity: No Food Insecurity (07/11/2023)  Housing: Low Risk  (07/14/2023)  Transportation Needs: No Transportation Needs (07/14/2023)  Recent Concern: Transportation Needs - Unmet Transportation Needs (07/11/2023)  Utilities: Not At Risk (07/11/2023)  Alcohol Screen: Low Risk  (07/14/2023)  Depression (PHQ2-9): Low Risk  (09/18/2021)  Financial Resource Strain: Low Risk  (07/14/2023)  Physical Activity: Insufficiently Active (01/20/2020)  Social Connections: Unknown (01/20/2020)  Stress: No Stress Concern Present (08/04/2019)  Tobacco Use: High Risk (07/11/2023)    Readmission Risk Interventions     No data to display

## 2023-07-15 DIAGNOSIS — I4892 Unspecified atrial flutter: Secondary | ICD-10-CM | POA: Diagnosis not present

## 2023-07-15 DIAGNOSIS — I5043 Acute on chronic combined systolic (congestive) and diastolic (congestive) heart failure: Secondary | ICD-10-CM | POA: Diagnosis not present

## 2023-07-15 LAB — MAGNESIUM: Magnesium: 2 mg/dL (ref 1.7–2.4)

## 2023-07-15 LAB — CBC
HCT: 37 % — ABNORMAL LOW (ref 39.0–52.0)
Hemoglobin: 12.3 g/dL — ABNORMAL LOW (ref 13.0–17.0)
MCH: 32.5 pg (ref 26.0–34.0)
MCHC: 33.2 g/dL (ref 30.0–36.0)
MCV: 97.6 fL (ref 80.0–100.0)
Platelets: 180 10*3/uL (ref 150–400)
RBC: 3.79 MIL/uL — ABNORMAL LOW (ref 4.22–5.81)
RDW: 15.5 % (ref 11.5–15.5)
WBC: 8.3 10*3/uL (ref 4.0–10.5)
nRBC: 0 % (ref 0.0–0.2)

## 2023-07-15 LAB — BASIC METABOLIC PANEL
Anion gap: 7 (ref 5–15)
BUN: 28 mg/dL — ABNORMAL HIGH (ref 8–23)
CO2: 29 mmol/L (ref 22–32)
Calcium: 8.2 mg/dL — ABNORMAL LOW (ref 8.9–10.3)
Chloride: 100 mmol/L (ref 98–111)
Creatinine, Ser: 1.29 mg/dL — ABNORMAL HIGH (ref 0.61–1.24)
GFR, Estimated: >60 mL/min (ref >60)
Glucose, Bld: 103 mg/dL — ABNORMAL HIGH (ref 70–99)
Potassium: 4.2 mmol/L (ref 3.5–5.1)
Sodium: 136 mmol/L (ref 135–145)

## 2023-07-15 LAB — HEPARIN LEVEL (UNFRACTIONATED)
Heparin Unfractionated: 0.55 [IU]/mL (ref 0.30–0.70)
Heparin Unfractionated: 0.5 [IU]/mL (ref 0.30–0.70)

## 2023-07-15 LAB — PHOSPHORUS: Phosphorus: 2.8 mg/dL (ref 2.5–4.6)

## 2023-07-15 MED ORDER — CEPHALEXIN 500 MG PO CAPS
500.0000 mg | ORAL_CAPSULE | Freq: Four times a day (QID) | ORAL | Status: AC
  Administered 2023-07-15 – 2023-07-16 (×4): 500 mg via ORAL
  Filled 2023-07-15 (×4): qty 1

## 2023-07-15 MED ORDER — POLYSACCHARIDE IRON COMPLEX 150 MG PO CAPS
150.0000 mg | ORAL_CAPSULE | Freq: Every day | ORAL | Status: DC
  Administered 2023-07-15 – 2023-07-31 (×17): 150 mg via ORAL
  Filled 2023-07-15 (×19): qty 1

## 2023-07-15 MED ORDER — POTASSIUM CHLORIDE CRYS ER 20 MEQ PO TBCR
40.0000 meq | EXTENDED_RELEASE_TABLET | Freq: Once | ORAL | Status: AC
  Administered 2023-07-15: 40 meq via ORAL
  Filled 2023-07-15: qty 2

## 2023-07-15 MED ORDER — LIDOCAINE HCL URETHRAL/MUCOSAL 2 % EX GEL
1.0000 | Freq: Once | CUTANEOUS | Status: AC
  Administered 2023-07-15: 1 via URETHRAL
  Filled 2023-07-15: qty 5

## 2023-07-15 MED ORDER — DIGOXIN 125 MCG PO TABS
0.1250 mg | ORAL_TABLET | Freq: Every day | ORAL | Status: DC
Start: 2023-07-15 — End: 2023-07-21
  Administered 2023-07-15 – 2023-07-20 (×6): 0.125 mg via ORAL
  Filled 2023-07-15 (×7): qty 1

## 2023-07-15 MED ORDER — VITAMIN D (ERGOCALCIFEROL) 1.25 MG (50000 UNIT) PO CAPS
50000.0000 [IU] | ORAL_CAPSULE | ORAL | Status: DC
  Administered 2023-07-15 – 2023-07-29 (×3): 50000 [IU] via ORAL
  Filled 2023-07-15 (×3): qty 1

## 2023-07-15 MED ORDER — APIXABAN 5 MG PO TABS
5.0000 mg | ORAL_TABLET | Freq: Two times a day (BID) | ORAL | Status: DC
  Administered 2023-07-15 – 2023-07-31 (×33): 5 mg via ORAL
  Filled 2023-07-15 (×33): qty 1

## 2023-07-15 MED ORDER — CEPHALEXIN 500 MG PO CAPS: 500.0000 mg | ORAL_CAPSULE | Freq: Four times a day (QID) | ORAL | Status: DC

## 2023-07-15 NOTE — Care Management Important Message (Signed)
Important Message  Patient Details  Name: Kevin [name] MRN: [id number] Date of Birth: 1955/11/22   Important Message Given:  Yes - Medicare IM     Bernadette Hoit 07/15/2023, 11:02 AM

## 2023-07-15 NOTE — Progress Notes (Signed)
[facility] Progress Note  Patient: Kevin [name]    MVH:[id number]  DOA: 07/10/2023     Date of Service: the patient was seen and examined on 07/15/2023  Chief Complaint  Patient presents with   Leg Swelling   Brief hospital course: 67 year old with CHF with reduced EF, COPD, DM 2, HTN comes to the ED with worsening dyspnea on exertion and lower extremity swelling.  Patient was found to be in CHF exacerbation started on IV diuretics.  Repeat echo shows EF 25%.  Occasionally declining medication but family is helpful. GDMT and diuresis per cardiology.    Assessment and Plan:  Principal Problem:   Acute on chronic combined systolic and diastolic CHF (congestive heart failure) (HCC) Active Problems:   Hypokalemia   Essential hypertension   Peripheral neuropathy   Dyslipidemia    Acute on chronic combined systolic and diastolic CHF (congestive heart failure) (HCC), EF 45% Bilateral lower extremity swelling with some blistering Medication noncompliance, previous echo 45% now 25%. - GDMT and diuretics per cardiology. Coreg 6.25 mg p.o. twice daily, Entresto 49-51 mg p.o. twice daily, Aldactone 25 mg p.o. daily, Farxiga 10 mg p.o. daily Lasix 80 mg IV twice daily Empiric Keflex for 5 days for possible LE cellulitis. (EOT 11/19) BNP 1517 elevated   Atrial flutter with RVR - TSH normal, s/p Heparin drip d/c'd on 11/19 Continue Coreg - May require cardioversion if he does not get back to normal sinus rhythm. 11/19 started Eliquis 5 mg p.o. twice daily and digoxin 0.125 mg p.o. daily   Urinary retention, causing elevated creatinine level In-N-Out cath was done twice as per RN on 11/18 and patient refused indwelling Foley catheter.   11/19 still patient is unable to void completely, having full incontinence.  Today patient agreed to insert Foley catheter.   Hypokalemia, potassium repleted.  Resolved. Monitor electrolytes and replete as needed.  Dyslipidemia -Pravastatin    COPD with tobacco use -As needed bronchodilators     Peripheral neuropathy -Gabapentin   Essential hypertension -Ongoing cardiac medication adjustments   Mild macrocytic anemia  Transferrin saturation 16%, slightly low, folate within normal range.  B12 level 1477 elevated Started oral iron supplement  Vitamin D deficiency: started vitamin D 50,000 units p.o. weekly, follow with PCP to repeat vitamin D level after 3 to 6 months.  Declining medications occasionally but sisters were helpful.   PT/OT-HH.  Face-to-face completed   Body mass index is 25.04 kg/m.  Interventions:  Diet: Heart healthy diet DVT Prophylaxis: Therapeutic Anticoagulation with heparin IV infusion    Advance goals of care discussion: Full code  Family Communication: family was not present at bedside, at the time of interview.  The pt provided permission to discuss medical plan with the family. Opportunity was given to ask question and all questions were answered satisfactorily.   Disposition:  Pt is from home, admitted with CHF, developed atrial flutter, still has tachycardia and on heparin IV infusion, may need cardioversion, which precludes a safe discharge. Discharge to home, when stable, cleared by cardiology, may need few days to improve.  Subjective: No significant events overnight, patient was sitting comfortably in the recliner, stated bilateral lower extremity edema is same, no any other complaints, no palpitations or chest pain.  Denied any shortness of breath. Patient has very poor insight, stated that he is able to pee but as per RN he still has urinary retention and having overflow incontinence.  But patient agreed for Foley catheter insertion.   Physical Exam: General:  NAD, lying comfortably Appear in no distress, affect appropriate Eyes: PERRLA ENT: Oral Mucosa Clear, moist  Neck: no JVD,  Cardiovascular: Irregular rhythm, no Murmur,  Respiratory: good respiratory effort, Bilateral  Air entry equal and Decreased, no Crackles, no wheezes Abdomen: Bowel Sound present, Soft and no tenderness,  Skin: no rashes Extremities: 4+ pedal edema, no calf tenderness,  Neurologic: without any new focal findings Gait not checked due to patient safety concerns  Vitals:   07/14/23 1927 07/14/23 2318 07/15/23 0344 07/15/23 0741  BP: 96/64 111/76 111/87 103/79  Pulse: 82 84 92 86  Resp: 17 16 16 15   Temp: 98.8 F (37.1 C) 98.4 F (36.9 C) 98.4 F (36.9 C)   TempSrc: Oral Oral Oral   SpO2: 97% 100% 100% 95%  Weight:      Height:        Intake/Output Summary (Last 24 hours) at 07/15/2023 1122 Last data filed at 07/15/2023 1107 Gross per 24 hour  Intake 389.12 ml  Output 2650 ml  Net -2260.88 ml   Filed Weights   07/11/23 0224 07/11/23 1200 07/14/23 0704  Weight: 78.9 kg 78.9 kg 74.7 kg    Data Reviewed: I have personally reviewed and interpreted daily labs, tele strips, imagings as discussed above. I reviewed all nursing notes, pharmacy notes, vitals, pertinent old records I have discussed plan of care as described above with RN and patient/family.  CBC: Recent Labs  Lab 07/10/23 1659 07/11/23 0518 07/12/23 1612 07/13/23 0425 07/14/23 0425 07/15/23 0453  WBC 10.0 10.3 10.1 9.2 9.9 8.3  NEUTROABS 7.7  --   --   --   --   --   HGB 14.3 12.4* 12.7* 14.0 12.7* 12.3*  HCT 44.0 37.7* 38.7* 42.6 38.9* 37.0*  MCV 103.3* 101.3* 100.8* 101.7* 101.0* 97.6  PLT 235 210 190 204 175 180   Basic Metabolic Panel: Recent Labs  Lab 07/10/23 1659 07/11/23 0518 07/12/23 1612 07/13/23 0425 07/14/23 0425 07/14/23 1008 07/15/23 0453  NA 137 140  --  135 137  --  136  K 3.4* 3.1*  --  3.8 3.3*  --  4.2  CL 99 101  --  96* 94*  --  100  CO2 29 26  --  30 33*  --  29  GLUCOSE 92 144*  --  105* 106*  --  103*  BUN 33* 29*  --  25* 33*  --  28*  CREATININE 1.23 1.09  --  1.23 1.37*  --  1.29*  CALCIUM 8.9 8.1*  --  8.0* 8.3*  --  8.2*  MG  --  2.1 1.9 1.9 1.9  --  2.0   PHOS  --  3.2  --   --   --  2.7 2.8    Studies: No results found.  Scheduled Meds:  apixaban  5 mg Oral BID   carvedilol  6.25 mg Oral BID WC   cephALEXin  500 mg Oral Q8H   dapagliflozin propanediol  10 mg Oral Daily   digoxin  0.125 mg Oral Daily   furosemide  80 mg Intravenous BID   gabapentin  300 mg Oral BID   iron polysaccharides  150 mg Oral Daily   pravastatin  20 mg Oral q1800   sacubitril-valsartan  1 tablet Oral BID   spironolactone  25 mg Oral Daily   Vitamin D (Ergocalciferol)  50,000 Units Oral Q7 days   Continuous Infusions:   PRN Meds: acetaminophen **OR** acetaminophen, guaiFENesin, hydrALAZINE, ipratropium-albuterol,  magnesium hydroxide, ondansetron **OR** ondansetron (ZOFRAN) IV, oxyCODONE-acetaminophen, senna-docusate, traZODone  Time spent: 55 minutes  Author: Gillis Santa. MD [facility] 07/15/2023 11:22 AM  To reach On-call, see care teams to locate the attending and reach out to them via [email or web address]. If 7PM-7AM, please contact night-coverage If you still have difficulty reaching the attending provider, please page the [facility] (Director on Call) for [facility] on amion for assistance.

## 2023-07-15 NOTE — Plan of Care (Signed)
  Problem: Education: Goal: Knowledge of General Education information will improve Description: Including pain rating scale, medication(s)/side effects and non-pharmacologic comfort measures Outcome: Not Progressing   

## 2023-07-15 NOTE — Progress Notes (Signed)
ANTICOAGULATION CONSULT NOTE  Pharmacy Consult for heparin infusion Indication: Aflutter  No Known Allergies  Patient Measurements: Height: 5\' 8"  (172.7 cm) Weight: 74.7 kg (164 lb 10.9 oz) IBW/kg (Calculated) : 68.4 Heparin Dosing Weight: 78.9 kg  Vital Signs: Temp: 98.4 F (36.9 C) (11/19 0344) Temp Source: Oral (11/19 0344) BP: 111/87 (11/19 0344) Pulse Rate: 92 (11/19 0344)  Labs: Recent Labs    07/13/23 0425 07/13/23 1044 07/14/23 0425 07/14/23 1008 07/14/23 1837 07/15/23 0453  HGB 14.0  --  12.7*  --   --  12.3*  HCT 42.6  --  38.9*  --   --  37.0*  PLT 204  --  175  --   --  180  LABPROT  --   --  16.6*  --   --   --   INR  --   --  1.3*  --   --   --   HEPARINUNFRC <0.10*   < >  --  0.51 0.83* 0.55  CREATININE 1.23  --  1.37*  --   --  1.29*   < > = values in this interval not displayed.    Estimated Creatinine Clearance: 54.5 mL/min (A) (by C-G formula based on SCr of 1.29 mg/dL (H)).   Medical History: Past Medical History:  Diagnosis Date   COPD (chronic obstructive pulmonary disease) (HCC)    Diabetes mellitus without complication (HCC)    Heart failure with mid-range ejection fraction (HCC)    a. 2019 reported neg stress test and nl cors on cath in [facility]; b. 07/2019 Echo: EF 25-30%, Gr2 DD, glob HK. Mod reduced RV fxn w/ volume overload. Mod dil RA. Mild to mod TR. Mod elev PASP; b. 12/2019 Echo: EF 50-55%; c. 05/2020 Echo: EF 45-50%, glob HK, sev LVH, GrII DD, nl RV size/fxn, mildly BAE, mild AoV sclerosis, mildly dilated Ao root.   Hypertension    NICM (nonischemic cardiomyopathy) (HCC)    Noncompliance    Pleural effusion on right    a. 07/2019 Thoracentesis: 300 ml.   Tobacco abuse    Medications Patient not on chronic anticoagulation prior to admission, per chart review.  Assessment: Pt is a 67 yo male presenting to ED c/o bilateral lower extremity swelling, found with elevated BNP and elevated troponin. Patient also had Aflutter with  rate 128 bpm. Currently diuresing with lasix. Increased lasix from 40 mg BID to 80 mg BID today. Per cardio plan to do TEE once euvolemic and consider cardioversion. CHAD2DS-VASc score 4 (20-74 yo, CHF hx, HTN, T2DM). Continue to rate control with beta blocker. Pharmacy has been consulted to manage heparin continuous infusion.   Goal of Therapy:  Heparin level 0.3-0.7 units/ml Monitor platelets by anticoagulation protocol: Yes  Monitoring: 11/15 0838 HL 0.40 Therapeutic x 1 11/15 1530 HL 0.30 Therapeutic x 2 (barely) 11/16 0730 heparin drip stopped- pt pulled out IV and now refusing IV 11/17 pt agrees to have heparin drip restarted 11/17 1657 HL < 0.10 See plan 11/18 0107 HL < 0.10 11/18 1008 HL 0.51 Therapeutic, x1  11/18 1837 HL 0.83 Supratherapeutic 11/19 0453 HL 0.55 Therapeutic x 1  Plan:  Continue heparin infusion rate to 1,200 units per hour Repeat heparin level 6 hours  Daily CBC with AM labs  Effie Shy, PharmD Pharmacy Resident  07/15/2023 7:32 AM

## 2023-07-15 NOTE — Progress Notes (Signed)
OT Cancellation Note  Patient Details Name: Kevin [name] MRN: [id number] DOB: Dec 18, 1955   Cancelled Treatment:    Reason Eval/Treat Not Completed: Patient declined, no reason specified. Pt seems more confused/fixated on cathter today asking OT and sitter in room to move his bed to the corner of the room and adjust him so that the cathter could be fixed. Edu that nursing staff would have to do this and there was a medical reason he had catheter in place. Notified nurse who reports she had been in and adjusted it 30 mins prior to OT visit. Pt declining all activity including taking a bed bath, sitting EOB/getting OOB. Unsure if pt will be safe to return home and care for himself and his mom, however unable to give assessment d/t pt refusals.  Constance Goltz 07/15/2023, 2:35 PM

## 2023-07-15 NOTE — Consult Note (Signed)
PHARMACY CONSULT NOTE - ELECTROLYTES  Pharmacy Consult for Electrolyte Monitoring and Replacement   Recent Labs: Height: 5\' 8"  (172.7 cm) Weight: 74.7 kg (164 lb 10.9 oz) IBW/kg (Calculated) : 68.4 Estimated Creatinine Clearance: 54.5 mL/min (A) (by C-G formula based on SCr of 1.29 mg/dL (H)). Potassium (mmol/L)  Date Value  07/15/2023 4.2   Magnesium (mg/dL)  Date Value  66/44/0347 2.0   Calcium (mg/dL)  Date Value  42/59/5638 8.2 (L)   Albumin (g/dL)  Date Value  75/64/3329 2.4 (L)  10/04/2020 4.5   Phosphorus (mg/dL)  Date Value  51/88/4166 2.8   Sodium (mmol/L)  Date Value  07/15/2023 136  10/04/2020 141    Assessment  Kevin [name] is a 67 y.o. male presenting with acute on chronic heart failure and atrial flutter. PMH significant for HF, HTN, DM, COPD, and tobacco use. ECHO 25%. May require cardioversion. Patient previously refused IV. Monitor patients renal function; elevated 1.29 from baseline ~1.0.  Pharmacy has been consulted to monitor and replace electrolytes.   Diet: Cardiac MIVF: N/A Pertinent medications:  Entresto 49/51 mg BID Spironolactone 25mg  daily Furosemide 80 mg BID  Goal of Therapy: Electrolytes WNL Goal K >4.0; today, 07/15/2023 K = 4.2 Goal Mg > 2.0; today, 07/15/2023 Mg = 2.0  Plan:  No replace indicated at this time Check BMP, Mg with AM labs  Thank you for involving pharmacy in this patient's care.   Effie Shy, PharmD Pharmacy Resident  07/15/2023 7:41 AM

## 2023-07-15 NOTE — Progress Notes (Signed)
PT Cancellation Note  Patient Details Name: Kevin [name] MRN: [id number] DOB: [birth year]   Cancelled Treatment:    Reason Eval/Treat Not Completed: Patient declined, no reason specified;Fatigue/lethargy limiting ability to participate. Pt received in bed with RN at bedside and refuses PT session. RN reports that pt has been sleeping all day and not alert/cooperative with activity. Will re-attempt skilled PT sessions with new PT orders if pt is willing to participate.  Wright Gravely [employer] SPT, LAT, ATC  Kevin [name] 07/15/2023, 1:15 PM

## 2023-07-15 NOTE — Progress Notes (Addendum)
Advanced Heart Failure Rounding Note  PCP-Cardiologist: Debbe Odea, MD   Subjective:   Yesterday diuretics increased IV lasix 80 mg twice a day. I/O not accurate. No weight for today.    Denies SOB. His sister is at his bedside.    Objective:   Weight Range: 74.7 kg Body mass index is 25.04 kg/m.   Vital Signs:   Temp:  [98.1 F (36.7 C)-98.8 F (37.1 C)] 98.4 F (36.9 C) (11/19 0344) Pulse Rate:  [73-98] 86 (11/19 0741) Resp:  [15-18] 15 (11/19 0741) BP: (93-122)/(64-87) 103/79 (11/19 0741) SpO2:  [94 %-100 %] 95 % (11/19 0741) Last BM Date : 07/13/23  Weight change: Filed Weights   07/11/23 0224 07/11/23 1200 07/14/23 0704  Weight: 78.9 kg 78.9 kg 74.7 kg    Intake/Output:   Intake/Output Summary (Last 24 hours) at 07/15/2023 0757 Last data filed at 07/15/2023 0657 Gross per 24 hour  Intake 706.24 ml  Output 2125 ml  Net -1418.76 ml      Physical Exam    General: In bed.  No resp difficulty. Sitter at bedside.  HEENT: Normal Neck: Supple. JVP  8-9. Carotids 2+ bilat; no bruits. No lymphadenopathy or thyromegaly appreciated. Cor: PMI nondisplaced. Irregular rate & rhythm. No rubs, gallops or murmurs. Lungs: Clear Abdomen: Soft, nontender, nondistended. No hepatosplenomegaly. No bruits or masses. Good bowel sounds. Extremities: No cyanosis, clubbing, rash, 2+ edema. R and LLE dressings.  Neuro: Alert & orientedx3, cranial nerves grossly intact. moves all 4 extremities w/o difficulty. Affect pleasant   Telemetry   A fib 90-100s with PVCs and brief NSVT  EKG    N/A   Labs    CBC Recent Labs    07/14/23 0425 07/15/23 0453  WBC 9.9 8.3  HGB 12.7* 12.3*  HCT 38.9* 37.0*  MCV 101.0* 97.6  PLT 175 180   Basic Metabolic Panel Recent Labs    52/84/13 0425 07/14/23 1008 07/15/23 0453  NA 137  --  136  K 3.3*  --  4.2  CL 94*  --  100  CO2 33*  --  29  GLUCOSE 106*  --  103*  BUN 33*  --  28*  CREATININE 1.37*  --  1.29*   CALCIUM 8.3*  --  8.2*  MG 1.9  --  2.0  PHOS  --  2.7 2.8   Liver Function Tests Recent Labs    07/14/23 0425  AST 48*  ALT 115*  ALKPHOS 122  BILITOT 1.9*  PROT 5.3*  ALBUMIN 2.4*   No results for input(s): "LIPASE", "AMYLASE" in the last 72 hours. Cardiac Enzymes No results for input(s): "CKTOTAL", "CKMB", "CKMBINDEX", "TROPONINI" in the last 72 hours.  BNP: BNP (last 3 results) Recent Labs    07/10/23 1700 07/14/23 1008  BNP 2,153.9* 1,517.1*    ProBNP (last 3 results) No results for input(s): "PROBNP" in the last 8760 hours.   D-Dimer No results for input(s): "DDIMER" in the last 72 hours. Hemoglobin A1C Recent Labs    07/14/23 0425  HGBA1C 6.0*   Fasting Lipid Panel No results for input(s): "CHOL", "HDL", "LDLCALC", "TRIG", "CHOLHDL", "LDLDIRECT" in the last 72 hours. Thyroid Function Tests No results for input(s): "TSH", "T4TOTAL", "T3FREE", "THYROIDAB" in the last 72 hours.  Invalid input(s): "FREET3"  Other results:   Imaging    No results found.   Medications:     Scheduled Medications:  carvedilol  6.25 mg Oral BID WC   cephALEXin  500 mg  Oral Q8H   dapagliflozin propanediol  10 mg Oral Daily   furosemide  80 mg Intravenous BID   gabapentin  300 mg Oral BID   lidocaine  1 Application Urethral Once   pravastatin  20 mg Oral q1800   sacubitril-valsartan  1 tablet Oral BID   spironolactone  25 mg Oral Daily    Infusions:  heparin 1,200 Units/hr (07/15/23 1610)    PRN Medications: acetaminophen **OR** acetaminophen, guaiFENesin, hydrALAZINE, ipratropium-albuterol, magnesium hydroxide, ondansetron **OR** ondansetron (ZOFRAN) IV, oxyCODONE-acetaminophen, senna-docusate, traZODone    Patient Profile   Mr Kevin [name] is a 67 year old with a history of NICM, HFimpEF, HTN, DMII, COPD, and tobacco abuse.    Admitted with A/C HFrEF and new onset A fib.   Assessment/Plan  1. A/C HFrEF, NICM  Had cath years ago in [place]. No  records.  EF down to 20-25% with moderate LVH. from previous 45-50% in 2021. Ideally would pursue CMRI to assess for amyloid. Check myeloma panel.  Volume status improving. Continue IV lasix 80 mg twice a day.  Once diuresed will need TEE/DC-CV.  Continue low dose bb, entresto, farxiga, and spironolactone.  Follow BMET.  Hold off on cath for now.  Needs daily weight and strict I/O.    2. A flutter RVR New  TSH ok. Continue low dose bb.  Continue heparin drip. Set up TEE/DC-CV once diuresed.  We discussed TEE/DC-CV with him and his sister. He is agreeable.   3. Lower extremity wounds/Cellulits  WOC following.    4. DMII    5. SDOH Consult SW. He does not drive.   Discussed heart failure follow up at discharge and importance of medication compliance. He is open to heart failure follow up in the clinic.    Length of Stay: 5  Jahnaya Branscome, NP  07/15/2023, 7:57 AM  Advanced Heart Failure Team Pager [phone] (M-F; 7a - 5p)  Please contact [facility] for night-coverage after hours (5p -7a ) and weekends on [email or web address]

## 2023-07-16 DIAGNOSIS — I5043 Acute on chronic combined systolic (congestive) and diastolic (congestive) heart failure: Secondary | ICD-10-CM | POA: Diagnosis not present

## 2023-07-16 LAB — CBC
HCT: 34.2 % — ABNORMAL LOW (ref 39.0–52.0)
Hemoglobin: 11.4 g/dL — ABNORMAL LOW (ref 13.0–17.0)
MCH: 32.4 pg (ref 26.0–34.0)
MCHC: 33.3 g/dL (ref 30.0–36.0)
MCV: 97.2 fL (ref 80.0–100.0)
Platelets: 166 10*3/uL (ref 150–400)
RBC: 3.52 MIL/uL — ABNORMAL LOW (ref 4.22–5.81)
RDW: 15.2 % (ref 11.5–15.5)
WBC: 10 10*3/uL (ref 4.0–10.5)
nRBC: 0 % (ref 0.0–0.2)

## 2023-07-16 LAB — BASIC METABOLIC PANEL
Anion gap: 7 (ref 5–15)
BUN: 28 mg/dL — ABNORMAL HIGH (ref 8–23)
CO2: 29 mmol/L (ref 22–32)
Calcium: 8.7 mg/dL — ABNORMAL LOW (ref 8.9–10.3)
Chloride: 104 mmol/L (ref 98–111)
Creatinine, Ser: 1.18 mg/dL (ref 0.61–1.24)
GFR, Estimated: >60 mL/min (ref >60)
Glucose, Bld: 104 mg/dL — ABNORMAL HIGH (ref 70–99)
Potassium: 4.8 mmol/L (ref 3.5–5.1)
Sodium: 140 mmol/L (ref 135–145)

## 2023-07-16 LAB — PHOSPHORUS: Phosphorus: 3.1 mg/dL (ref 2.5–4.6)

## 2023-07-16 LAB — MAGNESIUM: Magnesium: 2 mg/dL (ref 1.7–2.4)

## 2023-07-16 MED ORDER — CHLORHEXIDINE GLUCONATE CLOTH 2 % EX PADS
6.0000 | MEDICATED_PAD | Freq: Every day | CUTANEOUS | Status: DC
  Administered 2023-07-16 – 2023-07-31 (×16): 6 via TOPICAL

## 2023-07-16 MED ORDER — FUROSEMIDE 10 MG/ML IJ SOLN: 80.0000 mg | Freq: Once | INTRAMUSCULAR | Status: DC

## 2023-07-16 MED ORDER — FUROSEMIDE 10 MG/ML IJ SOLN
80.0000 mg | Freq: Once | INTRAMUSCULAR | Status: AC
  Administered 2023-07-16: 80 mg via INTRAVENOUS
  Filled 2023-07-16: qty 8

## 2023-07-16 MED ORDER — ACETAMINOPHEN 650 MG RE SUPP: 650.0000 mg | Freq: Four times a day (QID) | RECTAL | Status: DC | PRN

## 2023-07-16 MED ORDER — OLANZAPINE 5 MG PO TBDP
5.0000 mg | ORAL_TABLET | Freq: Once | ORAL | Status: DC
  Filled 2023-07-16: qty 1

## 2023-07-16 MED ORDER — ACETAMINOPHEN 325 MG PO TABS
650.0000 mg | ORAL_TABLET | Freq: Four times a day (QID) | ORAL | Status: DC | PRN
  Administered 2023-07-22: 650 mg via ORAL
  Filled 2023-07-16: qty 2

## 2023-07-16 NOTE — Progress Notes (Signed)
Patient is Aox3, disoriented to situation. Asking for foley catheter to be removed. Patient educated numerous times throughout the night the purpose of foley catheter with risks and benefits. Patient stated that he understood. Patient has 1:1 sitter for safety. Patient seen removing gown, bedding, leads and threatening to self remove foley. Patient reoriented, gown changed, bedding, foley assessed for patency and leads put back on patient. Patient reeducated by nurse. Around 2354 patient still very restless, Patient received PRN trazadone at 2120, lights dimmed, decreased stimulation and attended to patient needs. Notified Larkin Ina, NP. Olanzapine PO ordered. By time Olanzapine arrived from pharmacy patient was calm, cooperative and asleep. Med not given. Care ongoing.

## 2023-07-16 NOTE — Progress Notes (Signed)
[facility] Progress Note  Patient: Kevin [name]    CZY:[id number]  DOA: 07/10/2023     Date of Service: the patient was seen and examined on 07/16/2023  Chief Complaint  Patient presents with   Leg Swelling   Brief hospital course: 67 year old with CHF with reduced EF, COPD, DM 2, HTN comes to the ED with worsening dyspnea on exertion and lower extremity swelling.  Patient was found to be in CHF exacerbation started on IV diuretics.  Repeat echo shows EF 25%.  Occasionally declining medication but family is helpful. GDMT and diuresis per cardiology.    Assessment and Plan:  Principal Problem:   Acute on chronic combined systolic and diastolic CHF (congestive heart failure) (HCC) Active Problems:   Hypokalemia   Essential hypertension   Peripheral neuropathy   Dyslipidemia    Acute on chronic combined systolic and diastolic CHF (congestive heart failure) (HCC), EF 45% Bilateral lower extremity swelling with some blistering Medication noncompliance, previous echo 45% now 25%. - GDMT and diuretics per cardiology. Coreg 6.25 mg p.o. twice daily, Entresto 49-51 mg p.o. twice daily, Aldactone 25 mg p.o. daily, Farxiga 10 mg p.o. daily Lasix 80 mg IV twice daily Empiric Keflex for 5 days for possible LE cellulitis. (EOT 11/19) BNP 1517 elevated   Atrial flutter with RVR - TSH normal, s/p Heparin drip d/c'd on 11/19 Continue Coreg - May require cardioversion if he does not get back to normal sinus rhythm. 11/19 started Eliquis 5 mg p.o. twice daily and digoxin 0.125 mg p.o. daily 11/20 patient is n.p.o. for TEE today  Urinary retention, causing elevated creatinine level In-N-Out cath was done twice as per RN on 11/18 and patient refused indwelling Foley catheter.   11/19 Pt was unable to void, had overflow incontinence. agreed for Foley catheter insertion.   Hypokalemia, potassium repleted.  Resolved. Monitor electrolytes and replete as  needed.  Dyslipidemia -Pravastatin   COPD with tobacco use -As needed bronchodilators     Peripheral neuropathy -Gabapentin   Essential hypertension -Ongoing cardiac medication adjustments   Mild macrocytic anemia  Transferrin saturation 16%, slightly low, folate within normal range.  B12 level 1477 elevated Started oral iron supplement  Vitamin D deficiency: started vitamin D 50,000 units p.o. weekly, follow with PCP to repeat vitamin D level after 3 to 6 months.  Declining medications occasionally but sisters were helpful.   PT/OT-HH.  Face-to-face completed   Body mass index is 25.04 kg/m.  Interventions:  Diet: Heart healthy diet, NPO for TEE on 11/20 DVT Prophylaxis: Therapeutic Anticoagulation with Eliquis    Advance goals of care discussion: Full code  Family Communication: family was not present at bedside, at the time of interview.  The pt provided permission to discuss medical plan with the family. Opportunity was given to ask question and all questions were answered satisfactorily.   Disposition:  Pt is from home, admitted with CHF, developed atrial flutter,  may need cardioversion, s/p IV lasix, and Hep gtt, needs TEE scheduled on 11/20, still has significant lower extremity edema, which precludes a safe discharge. Discharge to home, when stable, cleared by cardiology, may need few days to improve.  Subjective: No significant events overnight, patient was hungry and wanted to eat food, advised to wait for TEE to be done and then we will feed him, denied any chest pain or palpitation.  Feels improvement in the lower extremity edema, no any other complaints.   Physical Exam: General: NAD, lying comfortably Appear in no distress, affect  appropriate Eyes: PERRLA ENT: Oral Mucosa Clear, moist  Neck: no JVD,  Cardiovascular: Irregular rhythm, no Murmur,  Respiratory: good respiratory effort, Bilateral Air entry equal and Decreased, no Crackles, no  wheezes Abdomen: Bowel Sound present, Soft and no tenderness,  Skin: no rashes Extremities: 2-3+ edema R>L, no calf tenderness,  Neurologic: without any new focal findings Gait not checked due to patient safety concerns  Vitals:   07/16/23 0436 07/16/23 0848 07/16/23 1259 07/16/23 1334  BP: 108/76 (!) 109/55 95/76   Pulse: 88 (!) 45 (!) 38 75  Resp:  15 15   Temp: 98.8 F (37.1 C) 97.8 F (36.6 C) (!) 97.4 F (36.3 C)   TempSrc: Oral     SpO2: 100% 100% 100%   Weight:      Height:        Intake/Output Summary (Last 24 hours) at 07/16/2023 1526 Last data filed at 07/16/2023 1415 Gross per 24 hour  Intake 474 ml  Output 4140 ml  Net -3666 ml   Filed Weights   07/11/23 0224 07/11/23 1200 07/14/23 0704  Weight: 78.9 kg 78.9 kg 74.7 kg    Data Reviewed: I have personally reviewed and interpreted daily labs, tele strips, imagings as discussed above. I reviewed all nursing notes, pharmacy notes, vitals, pertinent old records I have discussed plan of care as described above with RN and patient/family.  CBC: Recent Labs  Lab 07/10/23 1659 07/11/23 0518 07/12/23 1612 07/13/23 0425 07/14/23 0425 07/15/23 0453 07/16/23 0512  WBC 10.0   < > 10.1 9.2 9.9 8.3 10.0  NEUTROABS 7.7  --   --   --   --   --   --   HGB 14.3   < > 12.7* 14.0 12.7* 12.3* 11.4*  HCT 44.0   < > 38.7* 42.6 38.9* 37.0* 34.2*  MCV 103.3*   < > 100.8* 101.7* 101.0* 97.6 97.2  PLT 235   < > 190 204 175 180 166   < > = values in this interval not displayed.   Basic Metabolic Panel: Recent Labs  Lab 07/11/23 0518 07/12/23 1612 07/13/23 0425 07/14/23 0425 07/14/23 1008 07/15/23 0453 07/16/23 0512  NA 140  --  135 137  --  136 140  K 3.1*  --  3.8 3.3*  --  4.2 4.8  CL 101  --  96* 94*  --  100 104  CO2 26  --  30 33*  --  29 29  GLUCOSE 144*  --  105* 106*  --  103* 104*  BUN 29*  --  25* 33*  --  28* 28*  CREATININE 1.09  --  1.23 1.37*  --  1.29* 1.18  CALCIUM 8.1*  --  8.0* 8.3*  --  8.2*  8.7*  MG 2.1 1.9 1.9 1.9  --  2.0 2.0  PHOS 3.2  --   --   --  2.7 2.8 3.1    Studies: No results found.  Scheduled Meds:  apixaban  5 mg Oral BID   carvedilol  6.25 mg Oral BID WC   Chlorhexidine Gluconate Cloth  6 each Topical Daily   dapagliflozin propanediol  10 mg Oral Daily   digoxin  0.125 mg Oral Daily   gabapentin  300 mg Oral BID   iron polysaccharides  150 mg Oral Daily   OLANZapine zydis  5 mg Oral Once   pravastatin  20 mg Oral q1800   sacubitril-valsartan  1 tablet Oral BID  spironolactone  25 mg Oral Daily   Vitamin D (Ergocalciferol)  50,000 Units Oral Q7 days   Continuous Infusions:   PRN Meds: acetaminophen **OR** acetaminophen, guaiFENesin, hydrALAZINE, ipratropium-albuterol, magnesium hydroxide, ondansetron **OR** ondansetron (ZOFRAN) IV, oxyCODONE-acetaminophen, senna-docusate, traZODone  Time spent: 40 minutes  Author: Gillis Santa. MD [facility] 07/16/2023 3:26 PM  To reach On-call, see care teams to locate the attending and reach out to them via [email or web address]. If 7PM-7AM, please contact night-coverage If you still have difficulty reaching the attending provider, please page the [facility] (Director on Call) for [facility] on amion for assistance.

## 2023-07-16 NOTE — Progress Notes (Signed)
Advanced Heart Failure Rounding Note  PCP-Cardiologist: Debbe Odea, MD   Subjective:   - Feels much better today. Reports dyspnea is significant improved.  - 4.4L urine output yesterday  Objective:   Weight Range: 74.7 kg Body mass index is 25.04 kg/m.   Vital Signs:   Temp:  [97.8 F (36.6 C)-99.1 F (37.3 C)] 97.8 F (36.6 C) (11/20 0848) Pulse Rate:  [45-97] 45 (11/20 0848) Resp:  [15-18] 15 (11/20 0848) BP: (94-109)/(55-94) 109/55 (11/20 0848) SpO2:  [100 %] 100 % (11/20 0848) Last BM Date : 07/14/23  Weight change: Filed Weights   07/11/23 0224 07/11/23 1200 07/14/23 0704  Weight: 78.9 kg 78.9 kg 74.7 kg    Intake/Output:   Intake/Output Summary (Last 24 hours) at 07/16/2023 0905 Last data filed at 07/16/2023 0858 Gross per 24 hour  Intake 516.88 ml  Output 4615 ml  Net -4098.12 ml      Physical Exam    General: In bed.  No resp difficulty. Sitter at bedside.  HEENT: Normal Neck: Supple. JVP  10 Cor: PMI nondisplaced. Irregular rhythm Lungs: Clear Abdomen: Soft, nontender, nondistended. No hepatosplenomegaly. No bruits or masses. Good bowel sounds. Extremities: No cyanosis, clubbing, rash, 2+ edema. R and LLE dressings.  Neuro: Alert & orientedx3, cranial nerves grossly intact. moves all 4 extremities w/o difficulty. Affect pleasant   Telemetry   A fib 90-100s with PVCs and brief NSVT  EKG    N/A   Labs    CBC Recent Labs    07/15/23 0453 07/16/23 0512  WBC 8.3 10.0  HGB 12.3* 11.4*  HCT 37.0* 34.2*  MCV 97.6 97.2  PLT 180 166   Basic Metabolic Panel Recent Labs    16/10/96 0453 07/16/23 0512  NA 136 140  K 4.2 4.8  CL 100 104  CO2 29 29  GLUCOSE 103* 104*  BUN 28* 28*  CREATININE 1.29* 1.18  CALCIUM 8.2* 8.7*  MG 2.0 2.0  PHOS 2.8 3.1   Liver Function Tests Recent Labs    07/14/23 0425  AST 48*  ALT 115*  ALKPHOS 122  BILITOT 1.9*  PROT 5.3*  ALBUMIN 2.4*   No results for input(s): "LIPASE",  "AMYLASE" in the last 72 hours. Cardiac Enzymes No results for input(s): "CKTOTAL", "CKMB", "CKMBINDEX", "TROPONINI" in the last 72 hours.  BNP: BNP (last 3 results) Recent Labs    07/10/23 1700 07/14/23 1008  BNP 2,153.9* 1,517.1*    ProBNP (last 3 results) No results for input(s): "PROBNP" in the last 8760 hours.   D-Dimer No results for input(s): "DDIMER" in the last 72 hours. Hemoglobin A1C Recent Labs    07/14/23 0425  HGBA1C 6.0*   Fasting Lipid Panel No results for input(s): "CHOL", "HDL", "LDLCALC", "TRIG", "CHOLHDL", "LDLDIRECT" in the last 72 hours. Thyroid Function Tests No results for input(s): "TSH", "T4TOTAL", "T3FREE", "THYROIDAB" in the last 72 hours.  Invalid input(s): "FREET3"  Other results:   Imaging    No results found.   Medications:     Scheduled Medications:  apixaban  5 mg Oral BID   carvedilol  6.25 mg Oral BID WC   cephALEXin  500 mg Oral Q6H   dapagliflozin propanediol  10 mg Oral Daily   digoxin  0.125 mg Oral Daily   furosemide  80 mg Intravenous Once   gabapentin  300 mg Oral BID   iron polysaccharides  150 mg Oral Daily   OLANZapine zydis  5 mg Oral Once   pravastatin  20 mg Oral q1800   sacubitril-valsartan  1 tablet Oral BID   spironolactone  25 mg Oral Daily   Vitamin D (Ergocalciferol)  50,000 Units Oral Q7 days    Infusions:    PRN Medications: acetaminophen **OR** acetaminophen, guaiFENesin, hydrALAZINE, ipratropium-albuterol, magnesium hydroxide, ondansetron **OR** ondansetron (ZOFRAN) IV, oxyCODONE-acetaminophen, senna-docusate, traZODone    Patient Profile   Kevin [name] is a 67 year old with a history of NICM, HFimpEF, HTN, DMII, COPD, and tobacco abuse.    Admitted with A/C HFrEF and new onset A fib.   Assessment/Plan  1. A/C HFrEF, NICM  - Likely nonischemic cardiomyopathy; reports cath in [place] years ago but no records available.  - Remains in atrial flutter today - 4.4L urine output  yesterday; JVP remains elevated. Will continue IV diuresis.  - Continue coreg 6.25mg  BID, Farxiga 10mg  daily, digoxin daily, Entresto 49/51mg  BID, spironolactone 25mg  daily.    2. A flutter RVR New  - Apixaban 5mg  BID  - Plan on TEE/DCCV within the next 24h pending availability.    3. Lower extremity wounds/Cellulits  WOC following.    4. DMII    5. SDOH Consult SW. He does not drive.   Discussed heart failure follow up at discharge and importance of medication compliance. He is open to heart failure follow up in the clinic.    Length of Stay: 6  Adine Heimann, DO  07/16/2023, 9:05 AM  Advanced Heart Failure Team Pager [phone] (M-F; 7a - 5p)  Please contact [facility] for night-coverage after hours (5p -7a ) and weekends on [email or web address]

## 2023-07-16 NOTE — Progress Notes (Signed)
Occupational Therapy Treatment Patient Details Name: Kevin [name] MRN: [id number] DOB: 1955/10/26 Today's Date: 07/16/2023   History of present illness Pt is 67 y/o admitted 07/10/23 for Acute on chronic combined systolic and diastolic CHF and a-flutter. PMHx includes: HTN, peripheral neuropathy, dyslipidemia, and COPD.   OT comments  Pt is supine in bed on arrival. Pleasant and agreeable to OT session. He denies pain. Pt performed bed mobility with SUP. Needs MIN A for STS from EOB to RW and CGA for SPT from bed to recliner. Pt washed his face seated in recliner with set up assist. Declined further activity. He was left seated in recliner with all needs in place and sitter in room. He remains weak with mild balance deficits  will cont to require skilled acute OT services to maximize his safety and IND to return to PLOF.       If plan is discharge home, recommend the following:  A little help with walking and/or transfers;A little help with bathing/dressing/bathroom;Help with stairs or ramp for entrance;Assist for transportation;Assistance with cooking/housework   Equipment Recommendations  Other (comment);Tub/shower seat (RW)    Recommendations for Other Services      Precautions / Restrictions Restrictions Weight Bearing Restrictions: No       Mobility Bed Mobility Overal bed mobility: Needs Assistance Bed Mobility: Supine to Sit     Supine to sit: Supervision     General bed mobility comments: SUP for supine to sit at EOB and scooting to EOB    Transfers Overall transfer level: Needs assistance Equipment used: Rolling walker (2 wheels) Transfers: Sit to/from Stand, Bed to chair/wheelchair/BSC Sit to Stand: Min assist           General transfer comment: Min A for STS from EOB to RW and CGA for transfer to recliner using RW     Balance Overall balance assessment: Needs assistance Sitting-balance support: Feet supported Sitting balance-Leahy Scale: Good      Standing balance support: During functional activity, Reliant on assistive device for balance, Bilateral upper extremity supported Standing balance-Leahy Scale: Fair Standing balance comment: SBA to maintain balance but pt reliant on AD                           ADL either performed or assessed with clinical judgement   ADL Overall ADL's : Needs assistance/impaired     Grooming: Wash/dry face;Set up Grooming Details (indicate cue type and reason): pt declined oral care, but agreeable to wash face seated in recliner with set up assist                                    Extremity/Trunk Assessment Upper Extremity Assessment Upper Extremity Assessment: Overall WFL for tasks assessed   Lower Extremity Assessment Lower Extremity Assessment: Generalized weakness        Vision       Perception     Praxis      Cognition Arousal: Alert Behavior During Therapy: WFL for tasks assessed/performed Overall Cognitive Status: Difficult to assess                                          Exercises      Shoulder Instructions       General Comments  Pertinent Vitals/ Pain       Pain Assessment Pain Assessment: No/denies pain  Home Living                                          Prior Functioning/Environment              Frequency  Min 1X/week        Progress Toward Goals  OT Goals(current goals can now be found in the care plan section)  Progress towards OT goals: Progressing toward goals  Acute Rehab OT Goals Patient Stated Goal: return home OT Goal Formulation: With patient Time For Goal Achievement: 07/25/23 Potential to Achieve Goals: Good  Plan      Co-evaluation                 AM-PAC OT "6 Clicks" Daily Activity     Outcome Measure   Help from another person eating meals?: None Help from another person taking care of personal grooming?: None Help from another person  toileting, which includes using toliet, bedpan, or urinal?: A Little Help from another person bathing (including washing, rinsing, drying)?: A Little Help from another person to put on and taking off regular upper body clothing?: None Help from another person to put on and taking off regular lower body clothing?: A Little 6 Click Score: 21    End of Session Equipment Utilized During Treatment: Rolling walker (2 wheels)  OT Visit Diagnosis: Other abnormalities of gait and mobility (R26.89);Unsteadiness on feet (R26.81)   Activity Tolerance Patient tolerated treatment well   Patient Left in bed;with call bell/phone within reach;with nursing/sitter in room   Nurse Communication Mobility status        Time: 6578-4696 OT Time Calculation (min): 11 min  Charges: OT General Charges $OT Visit: 1 Visit OT Treatments $Therapeutic Activity: 8-22 mins  Corita Allinson, OTR/L  07/16/23, 10:55 AM   Constance Goltz 07/16/2023, 10:53 AM

## 2023-07-16 NOTE — Consult Note (Signed)
PHARMACY CONSULT NOTE - ELECTROLYTES  Pharmacy Consult for Electrolyte Monitoring and Replacement   Recent Labs: Height: 5\' 8"  (172.7 cm) Weight: 74.7 kg (164 lb 10.9 oz) IBW/kg (Calculated) : 68.4 Estimated Creatinine Clearance: 59.6 mL/min (by C-G formula based on SCr of 1.18 mg/dL). Potassium (mmol/L)  Date Value  07/16/2023 4.8   Magnesium (mg/dL)  Date Value  78/46/9629 2.0   Calcium (mg/dL)  Date Value  52/84/1324 8.7 (L)   Albumin (g/dL)  Date Value  40/05/2724 2.4 (L)  10/04/2020 4.5   Phosphorus (mg/dL)  Date Value  36/64/4034 3.1   Sodium (mmol/L)  Date Value  07/16/2023 140  10/04/2020 141    Assessment  Kevin [name] is a 67 y.o. male presenting with acute on chronic heart failure and atrial flutter. PMH significant for HF, HTN, DM, COPD, and tobacco use. ECHO 25%. May require cardioversion. Patient previously refused IV. Monitor patients renal function; elevated 1.35 >> 1.29 from baseline ~1.0.  Pharmacy has been consulted to monitor and replace electrolytes.   Diet: Cardiac MIVF: N/A Pertinent medications:  Entresto 49/51 mg BID Spironolactone 25 mg daily  Goal of Therapy: Electrolytes WNL Goal K >4.0; today, 07/16/2023 K = 5.0 Goal Mg > 2.0; today, 07/16/2023 Mg = 1.9  Plan:  No replace indicated at this time Monitor for K up-trending  Check BMP with AM labs  Thank you for involving pharmacy in this patient's care.   Effie Shy, PharmD Pharmacy Resident  07/16/2023 12:12 PM

## 2023-07-17 ENCOUNTER — Inpatient Hospital Stay: Payer: Medicare HMO | Admitting: Anesthesiology

## 2023-07-17 ENCOUNTER — Inpatient Hospital Stay
Admit: 2023-07-17 | Discharge: 2023-07-17 | Disposition: A | Discharge status: Home / Self Care | Payer: Medicare HMO | Attending: Cardiology | Admitting: Cardiology

## 2023-07-17 ENCOUNTER — Encounter
Admission: EM | Disposition: A | Discharge status: Home / Self Care | Payer: Self-pay | Source: Home / Self Care | Attending: Student

## 2023-07-17 DIAGNOSIS — I5043 Acute on chronic combined systolic (congestive) and diastolic (congestive) heart failure: Secondary | ICD-10-CM | POA: Diagnosis not present

## 2023-07-17 HISTORY — PX: TEE WITHOUT CARDIOVERSION: SHX5443

## 2023-07-17 HISTORY — PX: CARDIOVERSION: SHX1299

## 2023-07-17 LAB — MAGNESIUM: Magnesium: 1.9 mg/dL (ref 1.7–2.4)

## 2023-07-17 LAB — BASIC METABOLIC PANEL
Anion gap: 10 (ref 5–15)
BUN: 36 mg/dL — ABNORMAL HIGH (ref 8–23)
CO2: 26 mmol/L (ref 22–32)
Calcium: 8.2 mg/dL — ABNORMAL LOW (ref 8.9–10.3)
Chloride: 98 mmol/L (ref 98–111)
Creatinine, Ser: 1.35 mg/dL — ABNORMAL HIGH (ref 0.61–1.24)
GFR, Estimated: 58 mL/min — ABNORMAL LOW (ref >60)
Glucose, Bld: 100 mg/dL — ABNORMAL HIGH (ref 70–99)
Potassium: 5 mmol/L (ref 3.5–5.1)
Sodium: 134 mmol/L — ABNORMAL LOW (ref 135–145)

## 2023-07-17 LAB — RENAL FUNCTION PANEL
Albumin: 2.7 g/dL — ABNORMAL LOW (ref 3.5–5.0)
Anion gap: 10 (ref 5–15)
BUN: 34 mg/dL — ABNORMAL HIGH (ref 8–23)
CO2: 22 mmol/L (ref 22–32)
Calcium: 8.2 mg/dL — ABNORMAL LOW (ref 8.9–10.3)
Chloride: 102 mmol/L (ref 98–111)
Creatinine, Ser: 1.21 mg/dL (ref 0.61–1.24)
GFR, Estimated: >60 mL/min (ref >60)
Glucose, Bld: 70 mg/dL (ref 70–99)
Phosphorus: 3.9 mg/dL (ref 2.5–4.6)
Potassium: 4.7 mmol/L (ref 3.5–5.1)
Sodium: 134 mmol/L — ABNORMAL LOW (ref 135–145)

## 2023-07-17 LAB — CBC
HCT: 33.5 % — ABNORMAL LOW (ref 39.0–52.0)
Hemoglobin: 11.2 g/dL — ABNORMAL LOW (ref 13.0–17.0)
MCH: 32.5 pg (ref 26.0–34.0)
MCHC: 33.4 g/dL (ref 30.0–36.0)
MCV: 97.1 fL (ref 80.0–100.0)
Platelets: 170 10*3/uL (ref 150–400)
RBC: 3.45 MIL/uL — ABNORMAL LOW (ref 4.22–5.81)
RDW: 15.1 % (ref 11.5–15.5)
WBC: 9.5 10*3/uL (ref 4.0–10.5)
nRBC: 0 % (ref 0.0–0.2)

## 2023-07-17 LAB — PHOSPHORUS: Phosphorus: 3.9 mg/dL (ref 2.5–4.6)

## 2023-07-17 SURGERY — CARDIOVERSION
Anesthesia: General

## 2023-07-17 MED ORDER — BUTAMBEN-TETRACAINE-BENZOCAINE 2-2-14 % EX AERO
INHALATION_SPRAY | CUTANEOUS | Status: AC
  Filled 2023-07-17: qty 5

## 2023-07-17 MED ORDER — PHENAZOPYRIDINE HCL 200 MG PO TABS
200.0000 mg | ORAL_TABLET | Freq: Once | ORAL | Status: AC
  Administered 2023-07-17: 200 mg via ORAL
  Filled 2023-07-17: qty 1

## 2023-07-17 MED ORDER — PROPOFOL 10 MG/ML IV BOLUS
INTRAVENOUS | Status: DC | PRN
  Administered 2023-07-17: 20 mg via INTRAVENOUS
  Administered 2023-07-17: 40 mg via INTRAVENOUS
  Administered 2023-07-17 (×2): 20 mg via INTRAVENOUS

## 2023-07-17 MED ORDER — EPHEDRINE SULFATE (PRESSORS) 50 MG/ML IJ SOLN
INTRAMUSCULAR | Status: DC | PRN
  Administered 2023-07-17: 5 mg via INTRAVENOUS

## 2023-07-17 MED ORDER — PHENYLEPHRINE HCL (PRESSORS) 10 MG/ML IV SOLN
INTRAVENOUS | Status: DC | PRN
  Administered 2023-07-17: 100 ug via INTRAVENOUS

## 2023-07-17 MED ORDER — LIDOCAINE HCL (PF) 2 % IJ SOLN
INTRAMUSCULAR | Status: DC | PRN
  Administered 2023-07-17: 20 mg via INTRADERMAL

## 2023-07-17 MED ORDER — SODIUM CHLORIDE 0.9 % IV SOLN: INTRAVENOUS | Status: DC | PRN

## 2023-07-17 MED ORDER — SACUBITRIL-VALSARTAN 24-26 MG PO TABS
1.0000 | ORAL_TABLET | Freq: Two times a day (BID) | ORAL | Status: DC
  Administered 2023-07-17 – 2023-07-20 (×7): 1 via ORAL
  Filled 2023-07-17 (×7): qty 1

## 2023-07-17 MED ORDER — LIDOCAINE VISCOUS HCL 2 % MT SOLN
OROMUCOSAL | Status: AC
  Filled 2023-07-17: qty 15

## 2023-07-17 MED ORDER — SPIRONOLACTONE 12.5 MG HALF TABLET
12.5000 mg | ORAL_TABLET | Freq: Every day | ORAL | Status: DC
  Filled 2023-07-17: qty 1

## 2023-07-17 MED ORDER — SACUBITRIL-VALSARTAN 24-26 MG PO TABS
1.0000 | ORAL_TABLET | Freq: Two times a day (BID) | ORAL | Status: DC
Start: 2023-07-17 — End: 2023-07-17

## 2023-07-17 MED ORDER — PROPOFOL 10 MG/ML IV BOLUS
INTRAVENOUS | Status: AC
  Filled 2023-07-17: qty 20

## 2023-07-17 NOTE — Transfer of Care (Signed)
Immediate Anesthesia Transfer of Care Note  Patient: Kevin [name]  Procedure(s) Performed: CARDIOVERSION TRANSESOPHAGEAL ECHOCARDIOGRAM (TEE)  Patient Location: PACU and [facility]  Anesthesia Type:MAC  Level of Consciousness: drowsy  Airway & Oxygen Therapy: Patient Spontanous Breathing and Patient connected to nasal cannula oxygen  Post-op Assessment: Report given to RN and Post -op Vital signs reviewed and stable  Post vital signs: Reviewed and stable  Last Vitals:  Vitals Value Taken Time  BP 100/73   Temp    Pulse 81   Resp 16   SpO2 99     Last Pain:  Vitals:   07/17/23 1214  TempSrc: Oral  PainSc: 0-No pain      Patients Stated Pain Goal: 0 (07/17/23 0100)  Complications: No notable events documented.

## 2023-07-17 NOTE — Anesthesia Postprocedure Evaluation (Signed)
Anesthesia Post Note  Patient: Kevin [name]  Procedure(s) Performed: CARDIOVERSION TRANSESOPHAGEAL ECHOCARDIOGRAM (TEE)  Patient location during evaluation: Endoscopy Anesthesia Type: General Level of consciousness: awake and alert Pain management: pain level controlled Vital Signs Assessment: post-procedure vital signs reviewed and stable Respiratory status: spontaneous breathing, nonlabored ventilation, respiratory function stable and patient connected to nasal cannula oxygen Cardiovascular status: blood pressure returned to baseline and stable Postop Assessment: no apparent nausea or vomiting Anesthetic complications: no   No notable events documented.   Last Vitals:  Vitals:   07/17/23 1330 07/17/23 1345  BP: 95/67 109/78  Pulse: 82 82  Resp: 16 17  Temp:    SpO2: 99% 100%    Last Pain:  Vitals:   07/17/23 1214  TempSrc: Oral  PainSc: 0-No pain                 Louie Boston

## 2023-07-17 NOTE — Anesthesia Preprocedure Evaluation (Signed)
Anesthesia Evaluation  Patient identified by MRN, date of birth, ID band Patient awake    Reviewed: Allergy & Precautions, NPO status , Patient's Chart, lab work & pertinent test results  History of Anesthesia Complications Negative for: history of anesthetic complications  Airway Mallampati: III  TM Distance: >3 FB Neck ROM: full    Dental   Pulmonary COPD,  COPD inhaler, Current Smoker   Pulmonary exam normal        Cardiovascular hypertension, On Medications +CHF  + dysrhythmias Atrial Fibrillation  Rhythm:Irregular Rate:Tachycardia  Echo 07/12/2023 IMPRESSIONS     1. Left ventricular ejection fraction, by estimation, is 20 to 25%. Left  ventricular ejection fraction by 3D volume is 20 %. The left ventricle has  severely decreased function. The left ventricle has no regional wall  motion abnormalities. There is  moderate left ventricular hypertrophy. Left ventricular diastolic  parameters are indeterminate. The average left ventricular global  longitudinal strain is -6.3 %.   2. Right ventricular systolic function is moderately reduced. The right  ventricular size is normal. There is normal pulmonary artery systolic  pressure. The estimated right ventricular systolic pressure is 35.9 mmHg.   3. Left atrial size was moderately dilated.   4. Right atrial size was moderately dilated.   5. The mitral valve is normal in structure. Mild mitral valve  regurgitation. No evidence of mitral stenosis.   6. Tricuspid valve regurgitation is moderate to severe.   7. The aortic valve is tricuspid. Aortic valve regurgitation is not  visualized. No aortic stenosis is present.   8. Severely dilated pulmonary artery.   9. The inferior vena cava is normal in size with greater than 50%  respiratory variability, suggesting right atrial pressure of 3 mmHg.      Neuro/Psych  Neuromuscular disease  negative psych ROS   GI/Hepatic negative  GI ROS, Neg liver ROS,,,  Endo/Other  negative endocrine ROSdiabetes, Type 2    Renal/GU negative Renal ROS  negative genitourinary   Musculoskeletal   Abdominal   Peds  Hematology negative hematology ROS (+)   Anesthesia Other Findings Past Medical History: No date: COPD (chronic obstructive pulmonary disease) (HCC) No date: Diabetes mellitus without complication (HCC) No date: Heart failure with mid-range ejection fraction (HCC)     Comment:  a. 2019 reported neg stress test and nl cors on cath in               [facility]; b. 07/2019 Echo: EF 25-30%, Gr2 DD, glob HK.               Mod reduced RV fxn w/ volume overload. Mod dil RA. Mild               to mod TR. Mod elev PASP; b. 12/2019 Echo: EF 50-55%; c.               05/2020 Echo: EF 45-50%, glob HK, sev LVH, GrII DD, nl RV              size/fxn, mildly BAE, mild AoV sclerosis, mildly dilated               Ao root. No date: Hypertension No date: NICM (nonischemic cardiomyopathy) (HCC) No date: Noncompliance No date: Pleural effusion on right     Comment:  a. 07/2019 Thoracentesis: 300 ml. No date: Tobacco abuse  History reviewed. No pertinent surgical history.  BMI    Body Mass Index: 25.04 kg/m  Reproductive/Obstetrics negative OB ROS                              Anesthesia Physical Anesthesia Plan  ASA: 3  Anesthesia Plan: General   Post-op Pain Management: Minimal or no pain anticipated   Induction: Intravenous  PONV Risk Score and Plan: 1 and Propofol infusion and TIVA  Airway Management Planned: Natural Airway and Nasal Cannula  Additional Equipment:   Intra-op Plan:   Post-operative Plan:   Informed Consent: I have reviewed the patients History and Physical, chart, labs and discussed the procedure including the risks, benefits and alternatives for the proposed anesthesia with the patient or authorized representative who has indicated his/her understanding and  acceptance.     Dental Advisory Given  Plan Discussed with: Anesthesiologist, CRNA and Surgeon  Anesthesia Plan Comments: (Patient consented for risks of anesthesia including but not limited to:  - adverse reactions to medications - risk of airway placement if required - damage to eyes, teeth, lips or other oral mucosa - nerve damage due to positioning  - sore throat or hoarseness - Damage to heart, brain, nerves, lungs, other parts of body or loss of life  Patient voiced understanding and assent.)         Anesthesia Quick Evaluation

## 2023-07-17 NOTE — CV Procedure (Signed)
   TRANSESOPHAGEAL ECHOCARDIOGRAM GUIDED DIRECT CURRENT CARDIOVERSION  NAME:  Kevin [name]    MRN: [id number] DOB:  28-Apr-1956    ADMIT DATE: 07/10/2023  INDICATIONS: Symptomatic atrial flutter  PROCEDURE:   Informed consent was obtained prior to the procedure. The risks, benefits and alternatives for the procedure were discussed and the patient comprehended these risks.  Risks include, but are not limited to, cough, sore throat, vomiting, nausea, somnolence, esophageal and stomach trauma or perforation, bleeding, low blood pressure, aspiration, pneumonia, infection, trauma to the teeth and death.    After a procedural time-out, the oropharynx was anesthetized and the patient was sedated by the anesthesia service. The transesophageal probe was inserted in the esophagus and stomach without difficulty and multiple views were obtained. Sedation by anesthesia  COMPLICATIONS:    Complications: No complications Patient tolerated procedure well.  KEY FINDINGS:  LVEF 20-25% with severe LVH Severely reduced RV function.  Mild TR No LAA thrombus  Full report to follow.   CARDIOVERSION:     Indications:  Symptomatic Atrial Flutter  Procedure Details:  Once the TEE was complete, the patient had the defibrillator pads placed in the anterior and posterior position. Once an appropriate level of sedation was confirmed, the patient was cardioverted x 1 with 200J of biphasic synchronized energy.  The patient converted to NSR.  There were no apparent complications.  The patient had normal neuro status and respiratory status post procedure with vitals stable as recorded elsewhere.  Adequate airway was maintained throughout and vital signs monitored per protocol.  Lanika Colgate Advanced Heart Failure 4:51 PM

## 2023-07-17 NOTE — Progress Notes (Signed)
*  PRELIMINARY RESULTS* Echocardiogram Echocardiogram Transesophageal has been performed.  [name] 07/17/2023, 1:10 PM

## 2023-07-17 NOTE — Consult Note (Addendum)
PHARMACY CONSULT NOTE - ELECTROLYTES  Pharmacy Consult for Electrolyte Monitoring and Replacement   Recent Labs: Height: 5\' 8"  (172.7 cm) Weight: 74.7 kg (164 lb 10.9 oz) IBW/kg (Calculated) : 68.4 Estimated Creatinine Clearance: 52.1 mL/min (A) (by C-G formula based on SCr of 1.35 mg/dL (H)). Potassium (mmol/L)  Date Value  07/17/2023 5.0   Magnesium (mg/dL)  Date Value  40/98/1191 1.9   Calcium (mg/dL)  Date Value  47/82/9562 8.2 (L)   Albumin (g/dL)  Date Value  13/03/6577 2.4 (L)  10/04/2020 4.5   Phosphorus (mg/dL)  Date Value  46/96/2952 3.9   Sodium (mmol/L)  Date Value  07/17/2023 134 (L)  10/04/2020 141    Assessment  Kevin [name] is a 67 y.o. male presenting with acute on chronic heart failure and atrial flutter. PMH significant for HF, HTN, DM, COPD, and tobacco use. ECHO 25%. May require cardioversion. Patient previously refused IV. Monitor patients renal function; elevated 1.35 >> 1.29 from baseline ~1.0.  Pharmacy has been consulted to monitor and replace electrolytes.   Diet: Cardiac MIVF: N/A Pertinent medications:  Entresto 49/51 mg BID Spironolactone 25 mg daily  Goal of Therapy: Electrolytes WNL Goal K >4.0; today, 07/17/2023 K = 5.0 Goal Mg > 2.0; today, 07/17/2023 Mg = 1.9  Plan:  No replace indicated at this time Monitor for K up-trending  May need to add a potassium binder and decrease spironolactone  Check BMP with AM labs  Thank you for involving pharmacy in this patient's care.   Effie Shy, PharmD Pharmacy Resident  07/17/2023 8:03 AM

## 2023-07-17 NOTE — Progress Notes (Signed)
[facility] Progress Note  Patient: Kevin [name]    ZOX:[id number]  DOA: 07/10/2023     Date of Service: the patient was seen and examined on 07/17/2023  Chief Complaint  Patient presents with   Leg Swelling   Brief hospital course: 67 year old with CHF with reduced EF, COPD, DM 2, HTN comes to the ED with worsening dyspnea on exertion and lower extremity swelling.  Patient was found to be in CHF exacerbation started on IV diuretics.  Repeat echo shows EF 25%.  Occasionally declining medication but family is helpful. GDMT and diuresis per cardiology.    Assessment and Plan:  Principal Problem:   Acute on chronic combined systolic and diastolic CHF (congestive heart failure) (HCC) Active Problems:   Hypokalemia   Essential hypertension   Peripheral neuropathy   Dyslipidemia    Acute on chronic combined systolic and diastolic CHF (congestive heart failure) (HCC), EF 45% Bilateral lower extremity swelling with some blistering Medication noncompliance, previous echo 45% now 25%. - GDMT and diuretics per cardiology. Coreg 6.25 mg p.o. twice daily, Entresto 49-51 mg p.o. twice daily, Aldactone 25 mg p.o. daily, Farxiga 10 mg p.o. daily S/p Lasix 80 mg IV BID till 11/20 am dose S/p Empiric Keflex for 5 days for possible LE cellulitis. (EOT 11/19) BNP 1517 elevated   Atrial flutter with RVR - TSH normal, s/p Heparin drip d/c'd on 11/19 Continue Coreg - May require cardioversion if he does not get back to normal sinus rhythm. 11/19 started Eliquis 5 mg p.o. twice daily and digoxin 0.125 mg p.o. daily 11/21 patient is n.p.o. for TEE today  Urinary retention, causing elevated creatinine level In-N-Out cath was done twice as per RN on 11/18 and patient refused indwelling Foley catheter.   11/19 Pt was unable to void, had overflow incontinence. agreed for Foley catheter insertion. 11/21 Cr 1.35 slightly elevated, continue to monitor  Hypokalemia, potassium repleted.   Resolved. Monitor electrolytes and replete as needed.  Dyslipidemia -Pravastatin   COPD with tobacco use -As needed bronchodilators     Peripheral neuropathy -Gabapentin   Essential hypertension Continue Coreg, Entresto and Aldactone Blood pressure soft, continue to monitor and titrate medication accordingly Cardiology following    Mild macrocytic anemia  Transferrin saturation 16%, slightly low, folate within normal range.  B12 level 1477 elevated Started oral iron supplement  Vitamin D deficiency: started vitamin D 50,000 units p.o. weekly, follow with PCP to repeat vitamin D level after 3 to 6 months.  Declining medications occasionally but sisters were helpful.   PT/OT-HH.  Face-to-face completed   Body mass index is 25.04 kg/m.  Interventions:  Diet: Heart healthy diet, NPO for TEE on 11/21 DVT Prophylaxis: Therapeutic Anticoagulation with Eliquis    Advance goals of care discussion: Full code  Family Communication: family was not present at bedside, at the time of interview.  The pt provided permission to discuss medical plan with the family. Opportunity was given to ask question and all questions were answered satisfactorily.   Disposition:  Pt is from home, admitted with CHF, developed atrial flutter,  may need cardioversion, s/p IV lasix, and Hep gtt, needs TEE scheduled on 11/21, edema improved, gradually getting better, patient can be discharged home in couple of days when cleared by cardiology.  Subjective: No significant events overnight, patient feels improvement in the lower extremity edema, patient is awaiting for TEE today, n.p.o.  Denied any chest pain or palpitation, no any other complaints.   Physical Exam: General: NAD, lying comfortably  Appear in no distress, affect appropriate Eyes: PERRLA ENT: Oral Mucosa Clear, moist  Neck: no JVD,  Cardiovascular: Irregular rhythm, no Murmur,  Respiratory: good respiratory effort, Bilateral Air entry  equal and Decreased, no Crackles, no wheezes Abdomen: Bowel Sound present, Soft and no tenderness,  Skin: no rashes Extremities: mild edema R>L, no calf tenderness, significantly improved, Neurologic: without any new focal findings Gait not checked due to patient safety concerns  Vitals:   07/17/23 1214 07/17/23 1325 07/17/23 1330 07/17/23 1345  BP: 121/73 92/75 95/67  109/78  Pulse: (!) 105 83 82 82  Resp: 20 20 16 17   Temp: 97.9 F (36.6 C)     TempSrc: Oral     SpO2: 97% 100% 99% 100%  Weight:      Height:        Intake/Output Summary (Last 24 hours) at 07/17/2023 1632 Last data filed at 07/17/2023 1303 Gross per 24 hour  Intake 540 ml  Output 1825 ml  Net -1285 ml   Filed Weights   07/11/23 0224 07/11/23 1200 07/14/23 0704  Weight: 78.9 kg 78.9 kg 74.7 kg    Data Reviewed: I have personally reviewed and interpreted daily labs, tele strips, imagings as discussed above. I reviewed all nursing notes, pharmacy notes, vitals, pertinent old records I have discussed plan of care as described above with RN and patient/family.  CBC: Recent Labs  Lab 07/10/23 1659 07/11/23 0518 07/13/23 0425 07/14/23 0425 07/15/23 0453 07/16/23 0512 07/17/23 0633  WBC 10.0   < > 9.2 9.9 8.3 10.0 9.5  NEUTROABS 7.7  --   --   --   --   --   --   HGB 14.3   < > 14.0 12.7* 12.3* 11.4* 11.2*  HCT 44.0   < > 42.6 38.9* 37.0* 34.2* 33.5*  MCV 103.3*   < > 101.7* 101.0* 97.6 97.2 97.1  PLT 235   < > 204 175 180 166 170   < > = values in this interval not displayed.   Basic Metabolic Panel: Recent Labs  Lab 07/13/23 0425 07/14/23 0425 07/14/23 1008 07/15/23 0453 07/16/23 0512 07/17/23 0633 07/17/23 1420  NA 135 137  --  136 140 134* 134*  K 3.8 3.3*  --  4.2 4.8 5.0 4.7  CL 96* 94*  --  100 104 98 102  CO2 30 33*  --  29 29 26 22   GLUCOSE 105* 106*  --  103* 104* 100* 70  BUN 25* 33*  --  28* 28* 36* 34*  CREATININE 1.23 1.37*  --  1.29* 1.18 1.35* 1.21  CALCIUM 8.0* 8.3*  --   8.2* 8.7* 8.2* 8.2*  MG 1.9 1.9  --  2.0 2.0 1.9  --   PHOS  --   --  2.7 2.8 3.1 3.9 3.9    Studies: No results found.  Scheduled Meds:  apixaban  5 mg Oral BID   carvedilol  6.25 mg Oral BID WC   Chlorhexidine Gluconate Cloth  6 each Topical Daily   dapagliflozin propanediol  10 mg Oral Daily   digoxin  0.125 mg Oral Daily   gabapentin  300 mg Oral BID   iron polysaccharides  150 mg Oral Daily   OLANZapine zydis  5 mg Oral Once   pravastatin  20 mg Oral q1800   sacubitril-valsartan  1 tablet Oral BID   [START ON 07/18/2023] spironolactone  12.5 mg Oral Daily   Vitamin D (Ergocalciferol)  50,000 Units Oral Q7  days   Continuous Infusions:   PRN Meds: acetaminophen **OR** acetaminophen, guaiFENesin, hydrALAZINE, ipratropium-albuterol, magnesium hydroxide, ondansetron **OR** ondansetron (ZOFRAN) IV, oxyCODONE-acetaminophen, senna-docusate, traZODone  Time spent: 40 minutes  Author: Gillis Santa. MD [facility] 07/17/2023 4:32 PM  To reach On-call, see care teams to locate the attending and reach out to them via [email or web address]. If 7PM-7AM, please contact night-coverage If you still have difficulty reaching the attending provider, please page the [facility] (Director on Call) for [facility] on amion for assistance.

## 2023-07-18 ENCOUNTER — Encounter: Payer: Self-pay | Admitting: Cardiology

## 2023-07-18 ENCOUNTER — Inpatient Hospital Stay (HOSPITAL_COMMUNITY): Payer: Medicare HMO

## 2023-07-18 DIAGNOSIS — I420 Dilated cardiomyopathy: Secondary | ICD-10-CM | POA: Diagnosis not present

## 2023-07-18 DIAGNOSIS — I4892 Unspecified atrial flutter: Secondary | ICD-10-CM | POA: Diagnosis not present

## 2023-07-18 DIAGNOSIS — I5043 Acute on chronic combined systolic (congestive) and diastolic (congestive) heart failure: Secondary | ICD-10-CM | POA: Diagnosis not present

## 2023-07-18 LAB — CBC
HCT: 33.3 % — ABNORMAL LOW (ref 39.0–52.0)
Hemoglobin: 11 g/dL — ABNORMAL LOW (ref 13.0–17.0)
MCH: 32.6 pg (ref 26.0–34.0)
MCHC: 33 g/dL (ref 30.0–36.0)
MCV: 98.8 fL (ref 80.0–100.0)
Platelets: 172 10*3/uL (ref 150–400)
RBC: 3.37 MIL/uL — ABNORMAL LOW (ref 4.22–5.81)
RDW: 15 % (ref 11.5–15.5)
WBC: 7.7 10*3/uL (ref 4.0–10.5)
nRBC: 0 % (ref 0.0–0.2)

## 2023-07-18 LAB — BASIC METABOLIC PANEL
Anion gap: 10 (ref 5–15)
BUN: 33 mg/dL — ABNORMAL HIGH (ref 8–23)
CO2: 25 mmol/L (ref 22–32)
Calcium: 8.4 mg/dL — ABNORMAL LOW (ref 8.9–10.3)
Chloride: 98 mmol/L (ref 98–111)
Creatinine, Ser: 1.06 mg/dL (ref 0.61–1.24)
GFR, Estimated: >60 mL/min (ref >60)
Glucose, Bld: 108 mg/dL — ABNORMAL HIGH (ref 70–99)
Potassium: 4.7 mmol/L (ref 3.5–5.1)
Sodium: 133 mmol/L — ABNORMAL LOW (ref 135–145)

## 2023-07-18 LAB — MAGNESIUM: Magnesium: 2.1 mg/dL (ref 1.7–2.4)

## 2023-07-18 MED ORDER — GADOBUTROL 1 MMOL/ML IV SOLN
10.0000 mL | Freq: Once | INTRAVENOUS | Status: AC | PRN
  Administered 2023-07-18: 10 mL via INTRAVENOUS

## 2023-07-18 MED ORDER — FUROSEMIDE 40 MG PO TABS: 40.0000 mg | ORAL_TABLET | Freq: Every day | ORAL | Status: DC

## 2023-07-18 MED ORDER — SPIRONOLACTONE 12.5 MG HALF TABLET
12.5000 mg | ORAL_TABLET | Freq: Every day | ORAL | Status: DC
  Administered 2023-07-19 – 2023-07-20 (×2): 12.5 mg via ORAL
  Filled 2023-07-18 (×3): qty 1

## 2023-07-18 MED ORDER — FUROSEMIDE 40 MG PO TABS
60.0000 mg | ORAL_TABLET | Freq: Every day | ORAL | Status: DC
Start: 2023-07-18 — End: 2023-07-31
  Administered 2023-07-18 – 2023-07-31 (×14): 60 mg via ORAL
  Filled 2023-07-18 (×14): qty 1

## 2023-07-18 NOTE — Care Management Important Message (Signed)
Important Message  Patient Details  Name: Kevin [name] MRN: [id number] Date of Birth: 1955-09-02   Important Message Given:  Yes - Medicare IM     Olegario Messier A Trini Christiansen 07/18/2023, 1:51 PM

## 2023-07-18 NOTE — Consult Note (Addendum)
PHARMACY CONSULT NOTE - ELECTROLYTES  Pharmacy Consult for Electrolyte Monitoring and Replacement   Recent Labs: Height: 5\' 8"  (172.7 cm) Weight: 74.7 kg (164 lb 10.9 oz) IBW/kg (Calculated) : 68.4 Estimated Creatinine Clearance: 66.3 mL/min (by C-G formula based on SCr of 1.06 mg/dL). Potassium (mmol/L)  Date Value  07/18/2023 4.7   Magnesium (mg/dL)  Date Value  40/98/1191 2.1   Calcium (mg/dL)  Date Value  47/82/9562 8.4 (L)   Albumin (g/dL)  Date Value  13/03/6577 2.7 (L)  10/04/2020 4.5   Phosphorus (mg/dL)  Date Value  46/96/2952 3.9   Sodium (mmol/L)  Date Value  07/18/2023 133 (L)  10/04/2020 141    Assessment  Kevin [name] is a 67 y.o. male presenting with acute on chronic heart failure and atrial flutter. PMH significant for HF, HTN, DM, COPD, and tobacco use. ECHO 25%. Patient previously refused IV. Patient has history of declining medications. Monitor patients renal function fluctuating today 1.06; baseline ~1.0. Patient was cardioverted to NSR 11/21. Pharmacy has been consulted to monitor and replace electrolytes.   Diet: Cardiac MIVF: N/A Pertinent medications:  Entresto 49/51 mg BID Spironolactone 12.5 mg daily - decreased from 25/d 11/21  Goal of Therapy: Electrolytes WNL Goal K >4.0; today, 07/18/2023 K = 4.7 Goal Mg > 2.0; today, 07/18/2023 Mg = 2.1  Plan:  No replace indicated at this time Monitor for elevated K May need to add a potassium binder Check BMP with AM labs  Thank you for involving pharmacy in this patient's care.   Effie Shy, PharmD Pharmacy Resident  07/18/2023 7:51 AM

## 2023-07-18 NOTE — Progress Notes (Signed)
Occupational Therapy Treatment Patient Details Name: Kevin [name] MRN: [id number] DOB: [birth year] Today's Date: 07/18/2023   History of present illness Pt is 67 y/o admitted 07/10/23 for Acute on chronic combined systolic and diastolic CHF and a-flutter. PMHx includes: HTN, peripheral neuropathy, dyslipidemia, and COPD.   OT comments  Pt is supine in bed on arrival. Pleasant and agreeable to OT session. He reports minimal discomfort to catheter region. Pt performed bed mobility with SUP. BP monitored throughout session, 104/80 at EOB and in standing. Pt denies any dizziness/lightheadedness. Mod A to don socks seated EOB. Performed STS with Min A from EOB to RW x2. Ambulated ~60 feet using RW with CGA and close W/C follow and forward flexed posture. Fatigued at end of session and left seated in recliner with all needs in place. Linens changed and all questions and concerns from sisters in room answered. Recommend new PT consult for continued mobility now that pt is agreeable to therapy. Changed DC recommendation to STR to maximize his strength, safety and IND to return to PLOF. Will cont to follow for acute OT services.       If plan is discharge home, recommend the following:  A little help with walking and/or transfers;Help with stairs or ramp for entrance;Assist for transportation;Assistance with cooking/housework;A lot of help with bathing/dressing/bathroom;Direct supervision/assist for financial management;Supervision due to cognitive status;Direct supervision/assist for medications management   Equipment Recommendations  Other (comment);Tub/shower bench (RW)    Recommendations for Other Services PT consult    Precautions / Restrictions Restrictions Weight Bearing Restrictions: No       Mobility Bed Mobility Overal bed mobility: Needs Assistance Bed Mobility: Supine to Sit     Supine to sit: Supervision          Transfers Overall transfer level: Needs assistance Equipment  used: Rolling walker (2 wheels) Transfers: Sit to/from Stand Sit to Stand: Min assist           General transfer comment: Min A for STS from EOB, CGA to Min A from recliner d/t elevated surface with pillow under buttocks and cueing to scoot forward in chair; ambulated ~60 feet using RW with CGA and close recliner follow     Balance Overall balance assessment: Needs assistance Sitting-balance support: Feet supported Sitting balance-Leahy Scale: Good Sitting balance - Comments: no LOB while seated at EOB   Standing balance support: During functional activity, Reliant on assistive device for balance, Bilateral upper extremity supported Standing balance-Leahy Scale: Fair Standing balance comment: CGA for safety in standing and during mobility as pt likes to place forearms on walker, but able to hold properly with cueing; forward flexed posture noted                           ADL either performed or assessed with clinical judgement   ADL Overall ADL's : Needs assistance/impaired                     Lower Body Dressing: Moderate assistance Lower Body Dressing Details (indicate cue type and reason): increased time and need for assistance with R foot, able to don on L foot with Min A             Functional mobility during ADLs: Contact guard assist;+2 for safety/equipment;Rolling walker (2 wheels)      Extremity/Trunk Assessment Upper Extremity Assessment Upper Extremity Assessment: Overall WFL for tasks assessed   Lower Extremity Assessment Lower Extremity Assessment: Generalized weakness  Vision       Perception     Praxis      Cognition Arousal: Alert Behavior During Therapy: WFL for tasks assessed/performed Overall Cognitive Status: Impaired/Different from baseline Area of Impairment: Following commands, Safety/judgement, Awareness, Problem solving                       Following Commands: Follows one step commands with  increased time       General Comments: sisters present for today's session and report cognition is improving, but he is still not at baseline, trouble following directions needs increased cueing and time        Exercises      Shoulder Instructions       General Comments bil feet wrappings    Pertinent Vitals/ Pain       Pain Assessment Pain Assessment: Faces Faces Pain Scale: Hurts a little bit Pain Location: catheter region discomfort Pain Descriptors / Indicators: Discomfort Pain Intervention(s): Monitored during session  Home Living                                          Prior Functioning/Environment              Frequency  Min 1X/week        Progress Toward Goals  OT Goals(current goals can now be found in the care plan section)  Progress towards OT goals: Progressing toward goals  Acute Rehab OT Goals Patient Stated Goal: improve strength OT Goal Formulation: With patient Time For Goal Achievement: 07/25/23 Potential to Achieve Goals: Good  Plan      Co-evaluation                 AM-PAC OT "6 Clicks" Daily Activity     Outcome Measure   Help from another person eating meals?: None Help from another person taking care of personal grooming?: None Help from another person toileting, which includes using toliet, bedpan, or urinal?: A Lot Help from another person bathing (including washing, rinsing, drying)?: A Lot Help from another person to put on and taking off regular upper body clothing?: None Help from another person to put on and taking off regular lower body clothing?: A Lot 6 Click Score: 18    End of Session Equipment Utilized During Treatment: Rolling walker (2 wheels)  OT Visit Diagnosis: Other abnormalities of gait and mobility (R26.89);Unsteadiness on feet (R26.81)   Activity Tolerance Patient tolerated treatment well   Patient Left with family/visitor present;with chair alarm set;in chair;with call  bell/phone within reach   Nurse Communication Mobility status        Time: 9604-5409 OT Time Calculation (min): 48 min  Charges: OT General Charges $OT Visit: 1 Visit OT Treatments $Self Care/Home Management : 8-22 mins $Therapeutic Activity: 23-37 mins  Makari Sanko, OTR/L  07/18/23, 3:53 PM   Teirra Carapia E [place] 07/18/2023, 3:49 PM

## 2023-07-18 NOTE — Progress Notes (Signed)
Advanced Heart Failure Rounding Note  PCP-Cardiologist: Debbe Odea, MD   Subjective:   - Plan on TEE/DCCV today. Reports that he still has some dyspnea. Continues to have difficulty with ambulation due to LE wounds.   Objective:   Weight Range: 74.7 kg Body mass index is 25.04 kg/m.   Vital Signs:   Temp:  [97.8 F (36.6 C)-98.8 F (37.1 C)] 97.8 F (36.6 C) (11/22 0854) Pulse Rate:  [74-105] 74 (11/22 0854) Resp:  [16-20] 20 (11/22 0519) BP: (92-121)/(67-83) 94/83 (11/22 0854) SpO2:  [97 %-100 %] 97 % (11/22 0854) Last BM Date : 07/14/23  Weight change: Filed Weights   07/11/23 0224 07/11/23 1200 07/14/23 0704  Weight: 78.9 kg 78.9 kg 74.7 kg    Intake/Output:   Intake/Output Summary (Last 24 hours) at 07/18/2023 1208 Last data filed at 07/18/2023 1029 Gross per 24 hour  Intake 1377 ml  Output 1150 ml  Net 227 ml      Physical Exam    General: sitting in bed awaiting TEE HEENT: Normal Neck: Supple. JVP  8 Cor: PMI nondisplaced. Irregular rhythm Lungs: Clear Abdomen: Soft, nontender, nondistended. No hepatosplenomegaly. No bruits or masses. Good bowel sounds. Extremities: No cyanosis, clubbing, rash, 2+ edema. R and LLE dressings.  Neuro: Alert & orientedx3, cranial nerves grossly intact. moves all 4 extremities w/o difficulty. Affect pleasant   Telemetry   A fib 90-100s with PVCs and brief NSVT  EKG    N/A   Labs    CBC Recent Labs    07/17/23 0633 07/18/23 0457  WBC 9.5 7.7  HGB 11.2* 11.0*  HCT 33.5* 33.3*  MCV 97.1 98.8  PLT 170 172   Basic Metabolic Panel Recent Labs    29/56/21 0633 07/17/23 1420 07/18/23 0457  NA 134* 134* 133*  K 5.0 4.7 4.7  CL 98 102 98  CO2 26 22 25   GLUCOSE 100* 70 108*  BUN 36* 34* 33*  CREATININE 1.35* 1.21 1.06  CALCIUM 8.2* 8.2* 8.4*  MG 1.9  --  2.1  PHOS 3.9 3.9  --    Liver Function Tests Recent Labs    07/17/23 1420  ALBUMIN 2.7*   No results for input(s): "LIPASE",  "AMYLASE" in the last 72 hours. Cardiac Enzymes No results for input(s): "CKTOTAL", "CKMB", "CKMBINDEX", "TROPONINI" in the last 72 hours.  BNP: BNP (last 3 results) Recent Labs    07/10/23 1700 07/14/23 1008  BNP 2,153.9* 1,517.1*     Other results:   Imaging    No results found.   Medications:     Scheduled Medications:  apixaban  5 mg Oral BID   carvedilol  6.25 mg Oral BID WC   Chlorhexidine Gluconate Cloth  6 each Topical Daily   dapagliflozin propanediol  10 mg Oral Daily   digoxin  0.125 mg Oral Daily   furosemide  60 mg Oral Daily   gabapentin  300 mg Oral BID   iron polysaccharides  150 mg Oral Daily   OLANZapine zydis  5 mg Oral Once   pravastatin  20 mg Oral q1800   sacubitril-valsartan  1 tablet Oral BID   [START ON 07/19/2023] spironolactone  12.5 mg Oral Daily   Vitamin D (Ergocalciferol)  50,000 Units Oral Q7 days    Infusions:    PRN Medications: acetaminophen **OR** acetaminophen, guaiFENesin, hydrALAZINE, ipratropium-albuterol, magnesium hydroxide, ondansetron **OR** ondansetron (ZOFRAN) IV, oxyCODONE-acetaminophen, senna-docusate, traZODone    Patient Profile   Kevin [name] is a 67 year  old with a history of NICM, HFimpEF, HTN, DMII, COPD, and tobacco abuse.    Admitted with A/C HFrEF and new onset A fib.   Assessment/Plan  1. A/C HFrEF, NICM  - Likely nonischemic cardiomyopathy; reports cath in [place] years ago but no records available.  - Hold off IV diuresis tonight.  - TEE/DCCV today.  - Continue coreg 6.25mg  BID, Farxiga 10mg  daily, digoxin daily, Entresto 49/51mg  BID, spironolactone 25mg  daily.    2. A flutter RVR New  - Apixaban 5mg  BID  - TEE/DCCV today.    3. Lower extremity wounds/Cellulits  WOC following.    4. DMII    5. SDOH Consult SW. He does not drive.   Discussed heart failure follow up at discharge and importance of medication compliance. He is open to heart failure follow up in the clinic.     Length of Stay: 8  Kevin Kastelic, DO  07/18/2023, 12:08 PM  Advanced Heart Failure Team Pager [phone] (M-F; 7a - 5p)  Please contact [facility] for night-coverage after hours (5p -7a ) and weekends on [email or web address]

## 2023-07-18 NOTE — Progress Notes (Signed)
[facility] Progress Note  Patient: Kevin [name]    QIO:[id number]  DOA: 07/10/2023     Date of Service: the patient was seen and examined on 07/18/2023  Chief Complaint  Patient presents with   Leg Swelling   Brief hospital course: 67 year old with CHF with reduced EF, COPD, DM 2, HTN comes to the ED with worsening dyspnea on exertion and lower extremity swelling.  Patient was found to be in CHF exacerbation started on IV diuretics.  Repeat echo shows EF 25%.  Occasionally declining medication but family is helpful. GDMT and diuresis per cardiology.    Assessment and Plan:  Principal Problem:   Acute on chronic combined systolic and diastolic CHF (congestive heart failure) (HCC) Active Problems:   Hypokalemia   Essential hypertension   Peripheral neuropathy   Dyslipidemia    Acute on chronic combined systolic and diastolic CHF (congestive heart failure) (HCC), EF 45% Bilateral lower extremity swelling with some blistering Medication noncompliance, previous echo 45% now 25%. - GDMT and diuretics per cardiology. Coreg 6.25 mg p.o. twice daily, Farxiga 10 mg p.o. daily, digoxin 1.25 mcg p.o. daily Entresto 24-26 mg p.o. twice daily, Aldactone 12.5 mg p.o. daily,  S/p Lasix 80 mg IV BID till 11/20 am dose S/p Empiric Keflex for 5 days for possible LE cellulitis. (EOT 11/19) BNP 1517 elevated   Atrial flutter with RVR s/p cardioversion, converted to sinus rhythm TSH normal, s/p Heparin drip d/c'd on 11/19 Continue Coreg 11/19 started Eliquis 5 mg p.o. twice daily and digoxin 0.125 mg p.o. daily 11/21 s/p TEE/DCCV, verted to sinus rhythm   Urinary retention, causing elevated creatinine level In-N-Out cath was done twice as per RN on 11/18 and patient refused indwelling Foley catheter.   11/19 Pt was unable to void, had overflow incontinence. agreed for Foley catheter insertion. 11/22 Cr 1.06 continue to monitor D/w urologist Dr. Lonna Cobb, recommended to keep Foley for 1 week  and follow-up as an outpatient.  Hypokalemia, potassium repleted.  Resolved. Monitor electrolytes and replete as needed.  Dyslipidemia -Pravastatin   COPD with tobacco use -As needed bronchodilators     Peripheral neuropathy -Gabapentin   Essential hypertension Continue Coreg, Entresto and Aldactone Blood pressure soft, continue to monitor and titrate medication accordingly Cardiology following    Mild macrocytic anemia  Transferrin saturation 16%, slightly low, folate within normal range.  B12 level 1477 elevated Started oral iron supplement  Vitamin D deficiency: started vitamin D 50,000 units p.o. weekly, follow with PCP to repeat vitamin D level after 3 to 6 months.  Declining medications occasionally but sisters were helpful.   PT/OT-HH.  Face-to-face completed   Body mass index is 25.04 kg/m.  Interventions:  Diet: Heart healthy diet, NPO for TEE on 11/21 DVT Prophylaxis: Therapeutic Anticoagulation with Eliquis    Advance goals of care discussion: Full code  Family Communication: family was not present at bedside, at the time of interview.  The pt provided permission to discuss medical plan with the family. Opportunity was given to ask question and all questions were answered satisfactorily.   Disposition:  Pt is from home, admitted with CHF, developed atrial flutter,  may need cardioversion, s/p IV lasix, and Hep gtt, needs TEE scheduled on 11/21, edema improved, gradually getting better, patient can be discharged home in couple of days when cleared by cardiology.  Subjective: No significant events overnight, patient feels improvement in the lower extremity edema, patient is awaiting for TEE today, n.p.o.  Denied any chest pain or palpitation,  no any other complaints.   Physical Exam: General: NAD, lying comfortably Appear in no distress, affect appropriate Eyes: PERRLA ENT: Oral Mucosa Clear, moist  Neck: no JVD,  Cardiovascular: SR, S1-S2 audible, no  Murmur,  Respiratory: good respiratory effort, Bilateral Air entry equal and Decreased, no Crackles, no wheezes Abdomen: Bowel Sound present, Soft and no tenderness,  Skin: no rashes Extremities: mild edema, no calf tenderness.  Edema almost resolved Neurologic: without any new focal findings Gait not checked due to patient safety concerns  Vitals:   07/17/23 1942 07/17/23 2316 07/18/23 0519 07/18/23 0854  BP: 103/72 102/74 99/76 94/83   Pulse: 80 75 74 74  Resp: 20  20   Temp: 98.4 F (36.9 C) 98.8 F (37.1 C) 98.1 F (36.7 C) 97.8 F (36.6 C)  TempSrc: Oral Oral  Oral  SpO2: 99% 100% 97% 97%  Weight:      Height:        Intake/Output Summary (Last 24 hours) at 07/18/2023 1404 Last data filed at 07/18/2023 1200 Gross per 24 hour  Intake 1077 ml  Output 2250 ml  Net -1173 ml   Filed Weights   07/11/23 0224 07/11/23 1200 07/14/23 0704  Weight: 78.9 kg 78.9 kg 74.7 kg    Data Reviewed: I have personally reviewed and interpreted daily labs, tele strips, imagings as discussed above. I reviewed all nursing notes, pharmacy notes, vitals, pertinent old records I have discussed plan of care as described above with RN and patient/family.  CBC: Recent Labs  Lab 07/14/23 0425 07/15/23 0453 07/16/23 0512 07/17/23 0633 07/18/23 0457  WBC 9.9 8.3 10.0 9.5 7.7  HGB 12.7* 12.3* 11.4* 11.2* 11.0*  HCT 38.9* 37.0* 34.2* 33.5* 33.3*  MCV 101.0* 97.6 97.2 97.1 98.8  PLT 175 180 166 170 172   Basic Metabolic Panel: Recent Labs  Lab 07/14/23 0425 07/14/23 1008 07/15/23 0453 07/16/23 0512 07/17/23 0633 07/17/23 1420 07/18/23 0457  NA 137  --  136 140 134* 134* 133*  K 3.3*  --  4.2 4.8 5.0 4.7 4.7  CL 94*  --  100 104 98 102 98  CO2 33*  --  29 29 26 22 25   GLUCOSE 106*  --  103* 104* 100* 70 108*  BUN 33*  --  28* 28* 36* 34* 33*  CREATININE 1.37*  --  1.29* 1.18 1.35* 1.21 1.06  CALCIUM 8.3*  --  8.2* 8.7* 8.2* 8.2* 8.4*  MG 1.9  --  2.0 2.0 1.9  --  2.1  PHOS  --   2.7 2.8 3.1 3.9 3.9  --     Studies: No results found.  Scheduled Meds:  apixaban  5 mg Oral BID   carvedilol  6.25 mg Oral BID WC   Chlorhexidine Gluconate Cloth  6 each Topical Daily   dapagliflozin propanediol  10 mg Oral Daily   digoxin  0.125 mg Oral Daily   furosemide  60 mg Oral Daily   gabapentin  300 mg Oral BID   iron polysaccharides  150 mg Oral Daily   OLANZapine zydis  5 mg Oral Once   pravastatin  20 mg Oral q1800   sacubitril-valsartan  1 tablet Oral BID   [START ON 07/19/2023] spironolactone  12.5 mg Oral Daily   Vitamin D (Ergocalciferol)  50,000 Units Oral Q7 days   Continuous Infusions:   PRN Meds: acetaminophen **OR** acetaminophen, guaiFENesin, hydrALAZINE, ipratropium-albuterol, magnesium hydroxide, ondansetron **OR** ondansetron (ZOFRAN) IV, oxyCODONE-acetaminophen, senna-docusate, traZODone  Time spent: 40 minutes  Author: Gillis Santa. MD [facility] 07/18/2023 2:04 PM  To reach On-call, see care teams to locate the attending and reach out to them via [email or web address]. If 7PM-7AM, please contact night-coverage If you still have difficulty reaching the attending provider, please page the [facility] (Director on Call) for [facility] on amion for assistance.

## 2023-07-18 NOTE — Plan of Care (Signed)
  Problem: Education: Goal: Knowledge of General Education information will improve Description Including pain rating scale, medication(s)/side effects and non-pharmacologic comfort measures Outcome: Progressing   

## 2023-07-18 NOTE — Progress Notes (Signed)
Advanced Heart Failure Rounding Note  PCP-Cardiologist: Debbe Odea, MD   Subjective:   - TEE/DCCV yesterday.  - Improvement in dyspnea - EKG this AM with NSR.   Objective:   Weight Range: 74.7 kg Body mass index is 25.04 kg/m.   Vital Signs:   Temp:  [97.8 F (36.6 C)-98.8 F (37.1 C)] 97.8 F (36.6 C) (11/22 0854) Pulse Rate:  [74-105] 74 (11/22 0854) Resp:  [16-20] 20 (11/22 0519) BP: (92-121)/(67-83) 94/83 (11/22 0854) SpO2:  [97 %-100 %] 97 % (11/22 0854) Last BM Date : 07/14/23  Weight change: Filed Weights   07/11/23 0224 07/11/23 1200 07/14/23 0704  Weight: 78.9 kg 78.9 kg 74.7 kg    Intake/Output:   Intake/Output Summary (Last 24 hours) at 07/18/2023 1040 Last data filed at 07/18/2023 1029 Gross per 24 hour  Intake 1377 ml  Output 1150 ml  Net 227 ml      Physical Exam    General: In bed.  No resp difficulty. Sitter at bedside.  HEENT: Normal Neck: Supple. JVP 10-11 Cor: PMI nondisplaced. RRR.  Lungs: Clear Abdomen: Soft, nontender, nondistended. No hepatosplenomegaly. No bruits or masses. Good bowel sounds. Extremities: No cyanosis, clubbing, rash, 2+ edema. R and LLE dressings.  Neuro: Alert & orientedx3, cranial nerves grossly intact. moves all 4 extremities w/o difficulty. Affect pleasant   Telemetry   NSR w/ PACs  EKG    N/A   Labs    CBC Recent Labs    07/17/23 0633 07/18/23 0457  WBC 9.5 7.7  HGB 11.2* 11.0*  HCT 33.5* 33.3*  MCV 97.1 98.8  PLT 170 172   Basic Metabolic Panel Recent Labs    78/29/56 0633 07/17/23 1420 07/18/23 0457  NA 134* 134* 133*  K 5.0 4.7 4.7  CL 98 102 98  CO2 26 22 25   GLUCOSE 100* 70 108*  BUN 36* 34* 33*  CREATININE 1.35* 1.21 1.06  CALCIUM 8.2* 8.2* 8.4*  MG 1.9  --  2.1  PHOS 3.9 3.9  --    Liver Function Tests Recent Labs    07/17/23 1420  ALBUMIN 2.7*   BNP: BNP (last 3 results) Recent Labs    07/10/23 1700 07/14/23 1008  BNP 2,153.9* 1,517.1*    Other  results:   Imaging    No results found.   Medications:     Scheduled Medications:  apixaban  5 mg Oral BID   carvedilol  6.25 mg Oral BID WC   Chlorhexidine Gluconate Cloth  6 each Topical Daily   dapagliflozin propanediol  10 mg Oral Daily   digoxin  0.125 mg Oral Daily   furosemide  60 mg Oral Daily   gabapentin  300 mg Oral BID   iron polysaccharides  150 mg Oral Daily   OLANZapine zydis  5 mg Oral Once   pravastatin  20 mg Oral q1800   sacubitril-valsartan  1 tablet Oral BID   [START ON 07/19/2023] spironolactone  12.5 mg Oral Daily   Vitamin D (Ergocalciferol)  50,000 Units Oral Q7 days    Infusions:    PRN Medications: acetaminophen **OR** acetaminophen, guaiFENesin, hydrALAZINE, ipratropium-albuterol, magnesium hydroxide, ondansetron **OR** ondansetron (ZOFRAN) IV, oxyCODONE-acetaminophen, senna-docusate, traZODone    Patient Profile   Kevin [name] is a 67 year old with a history of NICM, HFimpEF, HTN, DMII, COPD, and tobacco abuse.    Admitted with A/C HFrEF and new onset A fib.   Assessment/Plan  1. A/C HFrEF, NICM  - Likely nonischemic  cardiomyopathy; reports cath in [place] years ago but no records available.  - Continue coreg 6.25mg  BID, Farxiga 10mg  daily, digoxin daily, Entresto 49/51mg  BID, spironolactone 25mg  daily.  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  - Cardiac MRI ordered yesterday. He can be discharged home after CMR on above medication regimen from  HF standpoint. He is now in normal sinus rhythm and euvolemic on exam. Will arrange close follow up in [name].  - Of note, if CMR is positive for amyloid, will need to discontinue digoxin.  - Start lasix 60mg  daily.  - Heart failure follow up on 07/28/23   2. A flutter RVR - Apixaban 5mg  BID  - Now in normal sinus rhythm   3. Lower extremity wounds/Cellulits  WOC following.    4. DMII    5. SDOH Consult SW. He  does not drive.   Discussed heart failure follow up at discharge and importance of medication compliance. He is open to heart failure follow up in the clinic.    Length of Stay: 8  Kevin Klas, DO  07/18/2023, 10:40 AM  Advanced Heart Failure Team Pager [phone] (M-F; 7a - 5p)  Please contact [facility] for night-coverage after hours (5p -7a ) and weekends on [email or web address]

## 2023-07-19 DIAGNOSIS — I5043 Acute on chronic combined systolic (congestive) and diastolic (congestive) heart failure: Secondary | ICD-10-CM | POA: Diagnosis not present

## 2023-07-19 LAB — BASIC METABOLIC PANEL
Anion gap: 5 (ref 5–15)
BUN: 30 mg/dL — ABNORMAL HIGH (ref 8–23)
CO2: 27 mmol/L (ref 22–32)
Calcium: 8.4 mg/dL — ABNORMAL LOW (ref 8.9–10.3)
Chloride: 102 mmol/L (ref 98–111)
Creatinine, Ser: 1.11 mg/dL (ref 0.61–1.24)
GFR, Estimated: >60 mL/min (ref >60)
Glucose, Bld: 112 mg/dL — ABNORMAL HIGH (ref 70–99)
Potassium: 4.4 mmol/L (ref 3.5–5.1)
Sodium: 134 mmol/L — ABNORMAL LOW (ref 135–145)

## 2023-07-19 LAB — CBC
HCT: 34.8 % — ABNORMAL LOW (ref 39.0–52.0)
Hemoglobin: 11.5 g/dL — ABNORMAL LOW (ref 13.0–17.0)
MCH: 33 pg (ref 26.0–34.0)
MCHC: 33 g/dL (ref 30.0–36.0)
MCV: 100 fL (ref 80.0–100.0)
Platelets: 175 10*3/uL (ref 150–400)
RBC: 3.48 MIL/uL — ABNORMAL LOW (ref 4.22–5.81)
RDW: 14.7 % (ref 11.5–15.5)
WBC: 8.9 10*3/uL (ref 4.0–10.5)
nRBC: 0 % (ref 0.0–0.2)

## 2023-07-19 LAB — MAGNESIUM: Magnesium: 2.1 mg/dL (ref 1.7–2.4)

## 2023-07-19 MED ORDER — TAMSULOSIN HCL 0.4 MG PO CAPS
0.4000 mg | ORAL_CAPSULE | Freq: Every day | ORAL | Status: DC
  Administered 2023-07-19 – 2023-07-20 (×3): 0.4 mg via ORAL
  Filled 2023-07-19 (×3): qty 1

## 2023-07-19 MED ORDER — HALOPERIDOL LACTATE 5 MG/ML IJ SOLN
2.0000 mg | Freq: Once | INTRAMUSCULAR | Status: AC
  Administered 2023-07-19: 2 mg via INTRAVENOUS
  Filled 2023-07-19: qty 1

## 2023-07-19 MED ORDER — QUETIAPINE FUMARATE 25 MG PO TABS
50.0000 mg | ORAL_TABLET | Freq: Two times a day (BID) | ORAL | Status: DC
  Administered 2023-07-19 – 2023-07-31 (×25): 50 mg via ORAL
  Filled 2023-07-19 (×25): qty 2

## 2023-07-19 NOTE — Evaluation (Signed)
Physical Therapy Evaluation Patient Details Name: Kevin [name] MRN: [id number] DOB: Dec 31, 1955 Today's Date: 07/19/2023  History of Present Illness  Pt is a 67 y/o admitted 07/10/23 for acute on chronic combined systolic and diastolic CHF and a flutter. PMH includes: HTN, peripheral neuropathy, dyslipidemia, and COPD.  Clinical Impression  Pt presented seated in chair covered in soiled blanket and with telemetry box removed. He was A & O x 2 with orientation to place and situation. Pt demonstrating cognitive deficits especially with communication often times slurring words and answering questions with word salad. Nursing present to report that pt had cut his catheter off and that he had to receive Haldol to stop him from being agitated. PT unable to determine whether this is pt's cognitive baseline, but either way, he appears unsafe to return home especially given his lack of safety awareness. He had to be redirected from furniture surfing in room and to maintain a closer proximity to walker. He required little to no physical assistance to perform these tasks, but due to his cognitive impairments is unsafe to return home at this point. He lives with his mom, and he reports taking care of her, so it does not appear that he has another family member to help keep him safe at home.         If plan is discharge home, recommend the following: A little help with walking and/or transfers;Direct supervision/assist for medications management;Supervision due to cognitive status;Assistance with cooking/housework;Assistance with feeding   Can travel by private vehicle        Equipment Recommendations    Recommendations for Other Services  Speech consult    Functional Status Assessment Patient has had a recent decline in their functional status and/or demonstrates limited ability to make significant improvements in function in a reasonable and predictable amount of time     Precautions / Restrictions  Precautions Precautions: None      Mobility  Bed Mobility                    Transfers Overall transfer level: Independent   Transfers: Sit to/from Stand Sit to Stand: Independent                Ambulation/Gait Ambulation/Gait assistance: Modified independent (Device/Increase time) Gait Distance (Feet): 50 Feet Assistive device: Rolling walker (2 wheels) Gait Pattern/deviations: Wide base of support, Trunk flexed Gait velocity: Slow        Stairs            Wheelchair Mobility     Tilt Bed    Modified Rankin (Stroke Patients Only)       Balance Overall balance assessment: Modified Independent Sitting-balance support: Feet supported Sitting balance-Leahy Scale: Normal     Standing balance support: Bilateral upper extremity supported Standing balance-Leahy Scale: Good                               Pertinent Vitals/Pain Pain Assessment Pain Assessment: No/denies pain    Home Living                          Prior Function                       Extremity/Trunk Assessment   Upper Extremity Assessment Upper Extremity Assessment: Defer to OT evaluation    Lower Extremity Assessment Lower Extremity Assessment: Overall WFL for  tasks assessed    Cervical / Trunk Assessment Cervical / Trunk Assessment: Normal  Communication   Communication Communication: Difficulty communicating thoughts/reduced clarity of speech Cueing Techniques: Verbal cues  Cognition Arousal: Alert Behavior During Therapy: Impulsive   Area of Impairment: Safety/judgement, Orientation                 Orientation Level: Time, Situation     Following Commands: Follows one step commands consistently Safety/Judgement: Decreased awareness of safety, Decreased awareness of deficits              General Comments      Exercises     Assessment/Plan    PT Assessment Patient needs continued PT services  PT Problem  List Decreased cognition;Decreased mobility       PT Treatment Interventions Gait training;Therapeutic activities;Patient/family education;Neuromuscular re-education;Therapeutic exercise    PT Goals (Current goals can be found in the Care Plan section)  Acute Rehab PT Goals PT Goal Formulation: Patient unable to participate in goal setting    Frequency Min 4X/week     Co-evaluation               AM-PAC PT "6 Clicks" Mobility  Outcome Measure Help needed turning from your back to your side while in a flat bed without using bedrails?: None Help needed moving from lying on your back to sitting on the side of a flat bed without using bedrails?: None Help needed moving to and from a bed to a chair (including a wheelchair)?: None Help needed standing up from a chair using your arms (e.g., wheelchair or bedside chair)?: None Help needed to walk in hospital room?: A Lot Help needed climbing 3-5 steps with a railing? : A Little 6 Click Score: 21    End of Session Equipment Utilized During Treatment: Gait belt Activity Tolerance: Patient tolerated treatment well Patient left: in chair;with nursing/sitter in room Nurse Communication: Mobility status PT Visit Diagnosis: Other abnormalities of gait and mobility (R26.89);Difficulty in walking, not elsewhere classified (R26.2);Adult, failure to thrive (R62.7);Other (comment) (Cognitive deficits that are limiting his safety awareness)    Time: 1130-1155 PT Time Calculation (min) (ACUTE ONLY): 25 min   Charges:   PT Evaluation $PT Eval Moderate Complexity: 1 Mod PT Treatments $Therapeutic Activity: 8-22 mins PT General Charges $$ ACUTE PT VISIT: 1 Visit       Ellin Goodie PT, DPT  [facility] [address] [place], [address] Phone: [phone]   Fax:  [phone]

## 2023-07-19 NOTE — Progress Notes (Signed)
Patient has been confused, anxious, thru out the shift. He pulled out his PIV at the beginning of the shift. Multiple attempts to get out of bed. Multiple times removing his leads wrapping the box up and putting it on the counter.  The patient was found  with the foley bag on the floor. Without the yellow insertion tubing attached. On assessment the retaining strip was found on the floor as well.  The patient was questioned about where was the yellow insertion section of the foley. He said he didn't know.  The entire room was searched including the garbage cans.  The yellow foley tubing was found inside of the cabinet under the window. It was cut into pieces sitting inside of the wound care toolkit. Scissors and tweezers were also found.  Everything has been thrown away.

## 2023-07-19 NOTE — Progress Notes (Signed)
Bladder scan shows 610cc, patient was educate regarding benefits of foley catheter but patient refused after several attempt. MD made aware.

## 2023-07-19 NOTE — Progress Notes (Signed)
[facility] Progress Note  Patient: Kevin [name]    DGL:[id number]  DOA: 07/10/2023     Date of Service: the patient was seen and examined on 07/19/2023  Chief Complaint  Patient presents with   Leg Swelling   Brief hospital course: 67 year old with CHF with reduced EF, COPD, DM 2, HTN comes to the ED with worsening dyspnea on exertion and lower extremity swelling.  Patient was found to be in CHF exacerbation started on IV diuretics.  Repeat echo shows EF 25%.  Occasionally declining medication but family is helpful. GDMT and diuresis per cardiology.    Assessment and Plan:  Principal Problem:   Acute on chronic combined systolic and diastolic CHF (congestive heart failure) (HCC) Active Problems:   Hypokalemia   Essential hypertension   Peripheral neuropathy   Dyslipidemia    Acute on chronic combined systolic and diastolic CHF (congestive heart failure) (HCC), EF 45% Bilateral lower extremity swelling with some blistering Medication noncompliance, previous echo 45% now 25%. - GDMT and diuretics per cardiology. Coreg 6.25 mg p.o. twice daily, Farxiga 10 mg p.o. daily, digoxin 1.25 mcg p.o. daily Entresto 24-26 mg p.o. twice daily, Aldactone 12.5 mg p.o. daily,  S/p Lasix 80 mg IV BID till 11/20 am dose S/p Empiric Keflex for 5 days for possible LE cellulitis. (EOT 11/19) BNP 1517 elevated   Atrial flutter with RVR s/p cardioversion, converted to sinus rhythm TSH normal, s/p Heparin drip d/c'd on 11/19 Continue Coreg 11/19 started Eliquis 5 mg p.o. twice daily and digoxin 0.125 mg p.o. daily 11/21 s/p TEE/DCCV, verted to sinus rhythm   Urinary retention, causing elevated creatinine level In-N-Out cath was done twice as per RN on 11/18 and patient refused indwelling Foley catheter.   11/19 Pt was unable to void, had overflow incontinence. agreed for Foley catheter insertion. 11/22 Cr 1.06 continue to monitor D/w urologist Dr. Lonna Cobb, recommended to keep Foley for 1 week  and follow-up as an outpatient. Started Flomax 0.4 mg p.o. daily 11/23 last night patient got confused and he pulled out IV line and pulled out Foley catheter as well. Patient is still retaining urine and refusing Foley catheter insertion.  RN was advised to continue bladder scan and reinsert Foley catheter when patient agrees.  Hypokalemia, potassium repleted.  Resolved. Monitor electrolytes and replete as needed.  Dyslipidemia -Pravastatin   COPD with tobacco use -As needed bronchodilators     Peripheral neuropathy -Gabapentin   Essential hypertension Continue Coreg, Entresto and Aldactone Blood pressure soft, continue to monitor and titrate medication accordingly Cardiology following    Mild macrocytic anemia  Transferrin saturation 16%, slightly low, folate within normal range.  B12 level 1477 elevated Started oral iron supplement  Vitamin D deficiency: started vitamin D 50,000 units p.o. weekly, follow with PCP to repeat vitamin D level after 3 to 6 months.  Declining medications occasionally but sisters were helpful.   Acute delirium and mild cognitive decline, unable to understand his medical situation. Patient was started on Seroquel 25 mg p.o. twice daily, on 11/23 increased dose Seroquel 50 mg p.o. twice daily. Haldol 2 mg IV one-time dose given on 11/23  PT/OT-HH.  Face-to-face completed   Body mass index is 23.9 kg/m.  Interventions:  Diet: Heart healthy diet DVT Prophylaxis: Therapeutic Anticoagulation with Eliquis    Advance goals of care discussion: Full code  Family Communication: family was not present at bedside, at the time of interview.  The pt provided permission to discuss medical plan with the family.  Opportunity was given to ask question and all questions were answered satisfactorily.   Disposition:  Pt is from home, admitted with CHF, developed atrial flutter,  may need cardioversion, s/p IV lasix, and Hep gtt, needs TEE scheduled on 11/21,  edema improved, gradually getting better, patient can be discharged home in couple of days when cleared by cardiology.  Subjective: Overnight patient got confused and he pulled IV line and pulled Foley catheter.  In the morning time patient was still refusing reinsertion of Foley catheter, he is retaining urine.  Patient does not understand his medical situation, it seems mild cognitive decline in his mental status.  Patient was sitting comfortably on the bed, denied any complaints except unable to urinate and overflow incontinence.  RN was advised to continue bladder scan and reinsert Foley when patient agrees.   Physical Exam: General: NAD, lying comfortably Appear in no distress, affect appropriate Eyes: PERRLA ENT: Oral Mucosa Clear, moist  Neck: no JVD,  Cardiovascular: SR, S1-S2 audible, no Murmur,  Respiratory: good respiratory effort, Bilateral Air entry equal and Decreased, no Crackles, no wheezes Abdomen: Bowel Sound present, Soft and no tenderness,  Skin: no rashes Extremities: mild edema, no calf tenderness.  Edema almost resolved Neurologic: without any new focal findings Gait not checked due to patient safety concerns  Vitals:   07/19/23 0411 07/19/23 0700 07/19/23 0729 07/19/23 1300  BP: (!) 132/95  131/89   Pulse: 75  76 77  Resp: 18  16   Temp: (!) 95 F (35 C)  98.2 F (36.8 C)   TempSrc:      SpO2: 100%  100% 100%  Weight:  71.3 kg    Height:        Intake/Output Summary (Last 24 hours) at 07/19/2023 1441 Last data filed at 07/19/2023 1158 Gross per 24 hour  Intake 720 ml  Output 1250 ml  Net -530 ml   Filed Weights   07/11/23 1200 07/14/23 0704 07/19/23 0700  Weight: 78.9 kg 74.7 kg 71.3 kg    Data Reviewed: I have personally reviewed and interpreted daily labs, tele strips, imagings as discussed above. I reviewed all nursing notes, pharmacy notes, vitals, pertinent old records I have discussed plan of care as described above with RN and  patient/family.  CBC: Recent Labs  Lab 07/15/23 0453 07/16/23 0512 07/17/23 0633 07/18/23 0457 07/19/23 0434  WBC 8.3 10.0 9.5 7.7 8.9  HGB 12.3* 11.4* 11.2* 11.0* 11.5*  HCT 37.0* 34.2* 33.5* 33.3* 34.8*  MCV 97.6 97.2 97.1 98.8 100.0  PLT 180 166 170 172 175   Basic Metabolic Panel: Recent Labs  Lab 07/14/23 1008 07/15/23 0453 07/16/23 0512 07/17/23 0633 07/17/23 1420 07/18/23 0457 07/19/23 0434  NA  --  136 140 134* 134* 133* 134*  K  --  4.2 4.8 5.0 4.7 4.7 4.4  CL  --  100 104 98 102 98 102  CO2  --  29 29 26 22 25 27   GLUCOSE  --  103* 104* 100* 70 108* 112*  BUN  --  28* 28* 36* 34* 33* 30*  CREATININE  --  1.29* 1.18 1.35* 1.21 1.06 1.11  CALCIUM  --  8.2* 8.7* 8.2* 8.2* 8.4* 8.4*  MG  --  2.0 2.0 1.9  --  2.1 2.1  PHOS 2.7 2.8 3.1 3.9 3.9  --   --     Studies: No results found.  Scheduled Meds:  apixaban  5 mg Oral BID   carvedilol  6.25  mg Oral BID WC   Chlorhexidine Gluconate Cloth  6 each Topical Daily   dapagliflozin propanediol  10 mg Oral Daily   digoxin  0.125 mg Oral Daily   furosemide  60 mg Oral Daily   gabapentin  300 mg Oral BID   iron polysaccharides  150 mg Oral Daily   OLANZapine zydis  5 mg Oral Once   pravastatin  20 mg Oral q1800   QUEtiapine  50 mg Oral BID   sacubitril-valsartan  1 tablet Oral BID   spironolactone  12.5 mg Oral Daily   tamsulosin  0.4 mg Oral QPC supper   Vitamin D (Ergocalciferol)  50,000 Units Oral Q7 days   Continuous Infusions:   PRN Meds: acetaminophen **OR** acetaminophen, guaiFENesin, hydrALAZINE, ipratropium-albuterol, magnesium hydroxide, ondansetron **OR** ondansetron (ZOFRAN) IV, oxyCODONE-acetaminophen, senna-docusate, traZODone  Time spent: 40 minutes  Author: Gillis Santa. MD [facility] 07/19/2023 2:41 PM  To reach On-call, see care teams to locate the attending and reach out to them via [email or web address]. If 7PM-7AM, please contact night-coverage If you still have difficulty reaching  the attending provider, please page the [facility] (Director on Call) for [facility] on amion for assistance.

## 2023-07-19 NOTE — Consult Note (Signed)
PHARMACY CONSULT NOTE - ELECTROLYTES  Pharmacy Consult for Electrolyte Monitoring and Replacement   Recent Labs: Height: 5\' 8"  (172.7 cm) Weight: 74.7 kg (164 lb 10.9 oz) IBW/kg (Calculated) : 68.4 Estimated Creatinine Clearance: 63.3 mL/min (by C-G formula based on SCr of 1.11 mg/dL). Potassium (mmol/L)  Date Value  07/19/2023 4.4   Magnesium (mg/dL)  Date Value  40/98/1191 2.1   Calcium (mg/dL)  Date Value  47/82/9562 8.4 (L)   Albumin (g/dL)  Date Value  13/03/6577 2.7 (L)  10/04/2020 4.5   Phosphorus (mg/dL)  Date Value  46/96/2952 3.9   Sodium (mmol/L)  Date Value  07/19/2023 134 (L)  10/04/2020 141    Assessment  Kevin [name] is a 67 y.o. male presenting with acute on chronic heart failure and atrial flutter. PMH significant for HF, HTN, DM, COPD, and tobacco use. ECHO 25%. Patient previously refused IV. Patient has history of declining medications. Monitor patients renal function fluctuating today 1.06; baseline ~1.0. Patient was cardioverted to NSR 11/21. Pharmacy has been consulted to monitor and replace electrolytes.   Diet: Cardiac MIVF: N/A Pertinent medications:  Entresto 49/51 mg BID Spironolactone 12.5 mg daily - decreased from 25/d 11/21  Goal of Therapy: Electrolytes WNL Goal K >4.0; today, 07/19/2023 K = 4.7 Goal Mg > 2.0; today, 07/19/2023 Mg = 2.1  Plan:  No replace indicated at this time Electrolytes have remained stable Pharmacy to sign off. Please re-consult if further assistance is needed  Thank you for involving pharmacy in this patient's care.   Bettey Costa, PharmD Clinical Pharmacist 07/19/2023 8:33 AM

## 2023-07-20 DIAGNOSIS — I5043 Acute on chronic combined systolic (congestive) and diastolic (congestive) heart failure: Secondary | ICD-10-CM | POA: Diagnosis not present

## 2023-07-20 LAB — BASIC METABOLIC PANEL
Anion gap: 7 (ref 5–15)
BUN: 30 mg/dL — ABNORMAL HIGH (ref 8–23)
CO2: 29 mmol/L (ref 22–32)
Calcium: 8.8 mg/dL — ABNORMAL LOW (ref 8.9–10.3)
Chloride: 103 mmol/L (ref 98–111)
Creatinine, Ser: 1.11 mg/dL (ref 0.61–1.24)
GFR, Estimated: >60 mL/min (ref >60)
Glucose, Bld: 106 mg/dL — ABNORMAL HIGH (ref 70–99)
Potassium: 4.4 mmol/L (ref 3.5–5.1)
Sodium: 139 mmol/L (ref 135–145)

## 2023-07-20 LAB — CBC
HCT: 35.8 % — ABNORMAL LOW (ref 39.0–52.0)
Hemoglobin: 11.9 g/dL — ABNORMAL LOW (ref 13.0–17.0)
MCH: 32.4 pg (ref 26.0–34.0)
MCHC: 33.2 g/dL (ref 30.0–36.0)
MCV: 97.5 fL (ref 80.0–100.0)
Platelets: 200 10*3/uL (ref 150–400)
RBC: 3.67 MIL/uL — ABNORMAL LOW (ref 4.22–5.81)
RDW: 14.5 % (ref 11.5–15.5)
WBC: 6.6 10*3/uL (ref 4.0–10.5)
nRBC: 0 % (ref 0.0–0.2)

## 2023-07-20 LAB — DIGOXIN LEVEL: Digoxin Level: 0.6 ng/mL — ABNORMAL LOW (ref 0.8–2.0)

## 2023-07-20 LAB — MAGNESIUM: Magnesium: 2.5 mg/dL — ABNORMAL HIGH (ref 1.7–2.4)

## 2023-07-20 NOTE — Progress Notes (Signed)
[facility] Progress Note  Patient: Kevin [name]    VOZ:[id number]  DOA: 07/10/2023     Date of Service: the patient was seen and examined on 07/20/2023  Chief Complaint  Patient presents with   Leg Swelling   Brief hospital course: 67 year old with CHF with reduced EF, COPD, DM 2, HTN comes to the ED with worsening dyspnea on exertion and lower extremity swelling.  Patient was found to be in CHF exacerbation started on IV diuretics.  Repeat echo shows EF 25%.  Occasionally declining medication but family is helpful. GDMT and diuresis per cardiology.    Assessment and Plan:  Principal Problem:   Acute on chronic combined systolic and diastolic CHF (congestive heart failure) (HCC) Active Problems:   Hypokalemia   Essential hypertension   Peripheral neuropathy   Dyslipidemia    Acute on chronic combined systolic and diastolic CHF (congestive heart failure) (HCC), EF 45% Bilateral lower extremity swelling with some blistering Medication noncompliance, previous echo 45% now 25%. - GDMT and diuretics per cardiology. Coreg 6.25 mg p.o. twice daily, Farxiga 10 mg p.o. daily, digoxin 1.25 mcg p.o. daily Entresto 24-26 mg p.o. twice daily, Aldactone 12.5 mg p.o. daily,  S/p Lasix 80 mg IV BID till 11/20 am dose S/p Empiric Keflex for 5 days for possible LE cellulitis. (EOT 11/19) BNP 1517 elevated   Atrial flutter with RVR s/p cardioversion, converted to sinus rhythm TSH normal, s/p Heparin drip d/c'd on 11/19 Continue Coreg 11/19 started Eliquis 5 mg p.o. twice daily and digoxin 0.125 mg p.o. daily 11/21 s/p TEE/DCCV, verted to sinus rhythm   Urinary retention, causing elevated creatinine level In-N-Out cath was done twice as per RN on 11/18 and patient refused indwelling Foley catheter.   11/19 Pt was unable to void, had overflow incontinence. agreed for Foley catheter insertion. 11/22 Cr 1.06 continue to monitor D/w urologist Dr. Lonna Cobb, recommended to keep Foley for 1 week  and follow-up as an outpatient. Started Flomax 0.4 mg p.o. daily 11/23 last night patient got confused and he pulled out IV line and pulled out Foley catheter as well. Patient is still retaining urine and refusing Foley catheter insertion.  RN was advised to continue bladder scan and reinsert Foley catheter when patient agrees.  Hypokalemia, potassium repleted.  Resolved. Monitor electrolytes and replete as needed.  Dyslipidemia -Pravastatin   COPD with tobacco use -As needed bronchodilators     Peripheral neuropathy -Gabapentin   Essential hypertension Continue Coreg, Entresto and Aldactone Blood pressure soft, continue to monitor and titrate medication accordingly Cardiology following    Mild macrocytic anemia  Transferrin saturation 16%, slightly low, folate within normal range.  B12 level 1477 elevated Started oral iron supplement  Vitamin D deficiency: started vitamin D 50,000 units p.o. weekly, follow with PCP to repeat vitamin D level after 3 to 6 months.  Declining medications occasionally but sisters were helpful.   Acute delirium and mild cognitive decline, unable to understand his medical situation. Patient was started on Seroquel 25 mg p.o. twice daily, on 11/23 increased dose Seroquel 50 mg p.o. twice daily. Haldol 2 mg IV one-time dose given on 11/23  PT/OT-HH.  Face-to-face completed   Body mass index is 23.2 kg/m.  Interventions:  Diet: Heart healthy diet DVT Prophylaxis: Therapeutic Anticoagulation with Eliquis    Advance goals of care discussion: Full code  Family Communication: family was not present at bedside, at the time of interview.  The pt provided permission to discuss medical plan with the family.  Opportunity was given to ask question and all questions were answered satisfactorily.   Disposition:  Pt is from home, admitted with CHF, developed atrial flutter,  s/p TEE and cardioversion, s/p IV lasix, and Hep gtt, clinically stable,  medically optimized to discharge. Follow TOC for discharge planning, patient will need SNF placement.   Subjective: No significant events overnight, patient was lying comfortably, stated that he is able to void, still does not have Foley catheter.  Denies any complaints, no chest pain or palpitation no shortness of breath.   Physical Exam: General: NAD, lying comfortably Appear in no distress, affect appropriate Eyes: PERRLA ENT: Oral Mucosa Clear, moist  Neck: no JVD,  Cardiovascular: SR, S1-S2 audible, no Murmur,  Respiratory: good respiratory effort, Bilateral Air entry equal and Decreased, no Crackles, no wheezes Abdomen: Bowel Sound present, Soft and no tenderness,  Skin: no rashes Extremities: mild edema, no calf tenderness.  Edema almost resolved Neurologic: without any new focal findings Gait not checked due to patient safety concerns  Vitals:   07/20/23 0456 07/20/23 0457 07/20/23 0808 07/20/23 1201  BP: (!) 145/78  120/70 117/74  Pulse: 70  65 64  Resp: 16  16 18   Temp: 98.1 F (36.7 C)  98.2 F (36.8 C) 97.7 F (36.5 C)  TempSrc: Oral     SpO2: 100%  100% 100%  Weight:  69.2 kg    Height:        Intake/Output Summary (Last 24 hours) at 07/20/2023 1313 Last data filed at 07/20/2023 1216 Gross per 24 hour  Intake --  Output 1465 ml  Net -1465 ml   Filed Weights   07/14/23 0704 07/19/23 0700 07/20/23 0457  Weight: 74.7 kg 71.3 kg 69.2 kg    Data Reviewed: I have personally reviewed and interpreted daily labs, tele strips, imagings as discussed above. I reviewed all nursing notes, pharmacy notes, vitals, pertinent old records I have discussed plan of care as described above with RN and patient/family.  CBC: Recent Labs  Lab 07/16/23 0512 07/17/23 0633 07/18/23 0457 07/19/23 0434 07/20/23 0510  WBC 10.0 9.5 7.7 8.9 6.6  HGB 11.4* 11.2* 11.0* 11.5* 11.9*  HCT 34.2* 33.5* 33.3* 34.8* 35.8*  MCV 97.2 97.1 98.8 100.0 97.5  PLT 166 170 172 175 200    Basic Metabolic Panel: Recent Labs  Lab 07/14/23 1008 07/15/23 0453 07/16/23 0512 07/17/23 0633 07/17/23 1420 07/18/23 0457 07/19/23 0434 07/20/23 0510  NA  --  136 140 134* 134* 133* 134* 139  K  --  4.2 4.8 5.0 4.7 4.7 4.4 4.4  CL  --  100 104 98 102 98 102 103  CO2  --  29 29 26 22 25 27 29   GLUCOSE  --  103* 104* 100* 70 108* 112* 106*  BUN  --  28* 28* 36* 34* 33* 30* 30*  CREATININE  --  1.29* 1.18 1.35* 1.21 1.06 1.11 1.11  CALCIUM  --  8.2* 8.7* 8.2* 8.2* 8.4* 8.4* 8.8*  MG  --  2.0 2.0 1.9  --  2.1 2.1 2.5*  PHOS 2.7 2.8 3.1 3.9 3.9  --   --   --     Studies: No results found.  Scheduled Meds:  apixaban  5 mg Oral BID   carvedilol  6.25 mg Oral BID WC   Chlorhexidine Gluconate Cloth  6 each Topical Daily   dapagliflozin propanediol  10 mg Oral Daily   digoxin  0.125 mg Oral Daily   furosemide  60 mg Oral Daily   gabapentin  300 mg Oral BID   iron polysaccharides  150 mg Oral Daily   OLANZapine zydis  5 mg Oral Once   pravastatin  20 mg Oral q1800   QUEtiapine  50 mg Oral BID   sacubitril-valsartan  1 tablet Oral BID   spironolactone  12.5 mg Oral Daily   tamsulosin  0.4 mg Oral QPC supper   Vitamin D (Ergocalciferol)  50,000 Units Oral Q7 days   Continuous Infusions:   PRN Meds: acetaminophen **OR** acetaminophen, guaiFENesin, hydrALAZINE, ipratropium-albuterol, magnesium hydroxide, ondansetron **OR** ondansetron (ZOFRAN) IV, oxyCODONE-acetaminophen, senna-docusate, traZODone  Time spent: 40 minutes  Author: Gillis Santa. MD [facility] 07/20/2023 1:13 PM  To reach On-call, see care teams to locate the attending and reach out to them via [email or web address]. If 7PM-7AM, please contact night-coverage If you still have difficulty reaching the attending provider, please page the [facility] (Director on Call) for [facility] on amion for assistance.

## 2023-07-20 NOTE — TOC Progression Note (Addendum)
Transition of Care [facility]) - Progression Note    Patient Details  Name: Kevin [name] MRN: [id number] Date of Birth: 06-13-1956  [facility] of [facility]) CM/SW Contact  Bing Quarry, RN Phone Number: 07/20/2023, 1:47 PM  Clinical Narrative: 11/24: Patient reported to not be oriented to time/situation and during the night became more confused pulling at lines and Foley catheter PT recommendations aer for STR/SNF. Due to orientation deficits, RN CM attempted to reach emergency contact. Bayard Beaver, Sister, at [phone] and left a VM with RN CM call back number to discuss discharge planning. PT notes reports patient lives with and took care of mother.    New PASRR obtained today #[id number] A  Gabriel Cirri MSN RN CM  Care Management Department.  [place]  [facility] Direct Dial: [phone] Main Office Phone: [phone] Weekends Only           Expected Discharge Plan and Services                                   HH Arranged: RN, PT, OT [facility] Agency: [facility] Date [facility] Agency Contacted: 07/13/23 Time HH Agency Contacted: [phone] Representative spoke with at [facility] Agency: Confirmed with Kandee Keen   Social Determinants of Health (SDOH) Interventions SDOH Screenings   Food Insecurity: No Food Insecurity (07/11/2023)  Housing: Low Risk  (07/14/2023)  Transportation Needs: No Transportation Needs (07/14/2023)  Recent Concern: Transportation Needs - Unmet Transportation Needs (07/11/2023)  Utilities: Not At Risk (07/11/2023)  Alcohol Screen: Low Risk  (07/14/2023)  Depression (PHQ2-9): Low Risk  (09/18/2021)  Financial Resource Strain: Low Risk  (07/14/2023)  Physical Activity: Insufficiently Active (01/20/2020)  Social Connections: Unknown (01/20/2020)  Stress: No Stress Concern Present (08/04/2019)  Tobacco Use: High Risk (07/11/2023)    Readmission Risk Interventions     No data to display

## 2023-07-20 NOTE — Progress Notes (Signed)
Occupational Therapy Treatment Patient Details Name: Kevin [name] MRN: [id number] DOB: 1956/01/11 Today's Date: 07/20/2023   History of present illness Pt is a 67 y/o admitted 07/10/23 for acute on chronic combined systolic and diastolic CHF and a flutter. PMH includes: HTN, peripheral neuropathy, dyslipidemia, and COPD.   OT comments  Upon entering the room, pt supine in bed and agreeable to OT intervention. Pt performing supine >sit with supervision to EOB. Pt needing min A to stand with HHA. Pt bed linens and gown soiled and he is assisted with changing gown while seated on commode and linen changed. Pt needing min A to change gown and then stands with assist to stand at sink with RW for oral care. Pt with very tangential speech throughout and needing min cuing to redirect to task. Pt ambulates with min guard and use of RW to recliner chair at end of session and chair alarm activated. Call bell and all needed items within reach upon exiting room.       If plan is discharge home, recommend the following:  A little help with walking and/or transfers;Help with stairs or ramp for entrance;Assist for transportation;Assistance with cooking/housework;A lot of help with bathing/dressing/bathroom;Direct supervision/assist for financial management;Supervision due to cognitive status;Direct supervision/assist for medications management   Equipment Recommendations  Other (comment);Tub/shower bench (RW)       Precautions / Restrictions Precautions Precautions: None       Mobility Bed Mobility Overal bed mobility: Needs Assistance Bed Mobility: Supine to Sit     Supine to sit: Supervision          Transfers Overall transfer level: Independent Equipment used: Rolling walker (2 wheels), 1 person hand held assist Transfers: Sit to/from Stand Sit to Stand: Min assist, Contact guard assist                 Balance Overall balance assessment: Needs assistance Sitting-balance support:  Feet supported Sitting balance-Leahy Scale: Good     Standing balance support: Bilateral upper extremity supported, Reliant on assistive device for balance, During functional activity Standing balance-Leahy Scale: Fair                             ADL either performed or assessed with clinical judgement   ADL Overall ADL's : Needs assistance/impaired     Grooming: Oral care;Wash/dry hands;Standing;Contact guard assist           Upper Body Dressing : Minimal assistance;Sitting       Toilet Transfer: Contact guard assist;Rolling walker (2 wheels);Regular Toilet   Toileting- Clothing Manipulation and Hygiene: Contact guard assist;Sit to/from stand              Extremity/Trunk Assessment Upper Extremity Assessment Upper Extremity Assessment: Generalized weakness   Lower Extremity Assessment Lower Extremity Assessment: Generalized weakness        Vision Patient Visual Report: No change from baseline            Cognition Arousal: Alert Behavior During Therapy: Impulsive Overall Cognitive Status: No family/caregiver present to determine baseline cognitive functioning Area of Impairment: Safety/judgement, Orientation, Attention, Following commands, Awareness, Problem solving                 Orientation Level: Time, Situation Current Attention Level: Focused   Following Commands: Follows one step commands consistently, Follows one step commands with increased time Safety/Judgement: Decreased awareness of safety, Decreased awareness of deficits Awareness: Intellectual Problem Solving: Slow processing, Requires verbal cues  General Comments: Pt with very tangential speech requiring cuing for safety awareness and redirection                   Pertinent Vitals/ Pain       Pain Assessment Pain Assessment: No/denies pain         Frequency  Min 1X/week        Progress Toward Goals  OT Goals(current goals can now be found in the care  plan section)  Progress towards OT goals: Progressing toward goals      AM-PAC OT "6 Clicks" Daily Activity     Outcome Measure   Help from another person eating meals?: None Help from another person taking care of personal grooming?: None Help from another person toileting, which includes using toliet, bedpan, or urinal?: A Lot Help from another person bathing (including washing, rinsing, drying)?: A Lot Help from another person to put on and taking off regular upper body clothing?: None Help from another person to put on and taking off regular lower body clothing?: A Lot 6 Click Score: 18    End of Session Equipment Utilized During Treatment: Rolling walker (2 wheels)  OT Visit Diagnosis: Other abnormalities of gait and mobility (R26.89);Unsteadiness on feet (R26.81)   Activity Tolerance Patient tolerated treatment well   Patient Left with chair alarm set;in chair;with call bell/phone within reach   Nurse Communication Mobility status        Time: 9604-5409 OT Time Calculation (min): 23 min  Charges: OT General Charges $OT Visit: 1 Visit OT Treatments $Self Care/Home Management : 23-37 mins  Jackquline Denmark, MS, OTR/L , CBIS ascom [phone]  07/20/23, 11:28 AM

## 2023-07-20 NOTE — Plan of Care (Signed)
  Problem: Clinical Measurements: Goal: Will remain free from infection Outcome: Progressing Goal: Diagnostic test results will improve Outcome: Progressing Goal: Cardiovascular complication will be avoided Outcome: Progressing   Problem: Nutrition: Goal: Adequate nutrition will be maintained Outcome: Progressing   Problem: Elimination: Goal: Will not experience complications related to urinary retention Outcome: Progressing   Problem: Safety: Goal: Ability to remain free from injury will improve Outcome: Progressing   Problem: Skin Integrity: Goal: Risk for impaired skin integrity will decrease Outcome: Progressing

## 2023-07-21 DIAGNOSIS — I5043 Acute on chronic combined systolic (congestive) and diastolic (congestive) heart failure: Secondary | ICD-10-CM | POA: Diagnosis not present

## 2023-07-21 LAB — BASIC METABOLIC PANEL
Anion gap: 8 (ref 5–15)
BUN: 28 mg/dL — ABNORMAL HIGH (ref 8–23)
CO2: 26 mmol/L (ref 22–32)
Calcium: 8.3 mg/dL — ABNORMAL LOW (ref 8.9–10.3)
Chloride: 103 mmol/L (ref 98–111)
Creatinine, Ser: 1.2 mg/dL (ref 0.61–1.24)
GFR, Estimated: >60 mL/min (ref >60)
Glucose, Bld: 124 mg/dL — ABNORMAL HIGH (ref 70–99)
Potassium: 3.9 mmol/L (ref 3.5–5.1)
Sodium: 137 mmol/L (ref 135–145)

## 2023-07-21 LAB — CBC
HCT: 34.1 % — ABNORMAL LOW (ref 39.0–52.0)
Hemoglobin: 11.4 g/dL — ABNORMAL LOW (ref 13.0–17.0)
MCH: 32.6 pg (ref 26.0–34.0)
MCHC: 33.4 g/dL (ref 30.0–36.0)
MCV: 97.4 fL (ref 80.0–100.0)
Platelets: 219 10*3/uL (ref 150–400)
RBC: 3.5 MIL/uL — ABNORMAL LOW (ref 4.22–5.81)
RDW: 14.6 % (ref 11.5–15.5)
WBC: 6.8 10*3/uL (ref 4.0–10.5)
nRBC: 0 % (ref 0.0–0.2)

## 2023-07-21 LAB — MAGNESIUM: Magnesium: 2.2 mg/dL (ref 1.7–2.4)

## 2023-07-21 MED ORDER — FINASTERIDE 5 MG PO TABS
5.0000 mg | ORAL_TABLET | Freq: Every evening | ORAL | Status: DC
  Administered 2023-07-21 – 2023-07-30 (×10): 5 mg via ORAL
  Filled 2023-07-21 (×10): qty 1

## 2023-07-21 MED ORDER — DIGOXIN 125 MCG PO TABS
0.1250 mg | ORAL_TABLET | Freq: Every day | ORAL | Status: DC
  Administered 2023-07-21 – 2023-07-31 (×11): 0.125 mg via ORAL
  Filled 2023-07-21 (×11): qty 1

## 2023-07-21 MED ORDER — MEDIHONEY WOUND/BURN DRESSING EX PSTE
1.0000 | PASTE | Freq: Every day | CUTANEOUS | Status: DC
  Filled 2023-07-21: qty 44

## 2023-07-21 MED ORDER — TAMSULOSIN HCL 0.4 MG PO CAPS
0.8000 mg | ORAL_CAPSULE | Freq: Every day | ORAL | Status: DC
  Administered 2023-07-21 – 2023-07-23 (×3): 0.8 mg via ORAL
  Filled 2023-07-21 (×3): qty 2

## 2023-07-21 MED ORDER — SACUBITRIL-VALSARTAN 49-51 MG PO TABS
1.0000 | ORAL_TABLET | Freq: Two times a day (BID) | ORAL | Status: DC
  Administered 2023-07-21 – 2023-07-31 (×21): 1 via ORAL
  Filled 2023-07-21 (×21): qty 1

## 2023-07-21 MED ORDER — SPIRONOLACTONE 25 MG PO TABS
25.0000 mg | ORAL_TABLET | Freq: Every day | ORAL | Status: DC
  Administered 2023-07-21 – 2023-07-31 (×11): 25 mg via ORAL
  Filled 2023-07-21 (×11): qty 1

## 2023-07-21 NOTE — Progress Notes (Signed)
Patient ID: Kevin [name], male   DOB: 11-26-1955, 67 y.o.   MRN: [id number]     Advanced Heart Failure Rounding Note  PCP-Cardiologist: Debbe Odea, MD   Subjective:    No complaints this morning.  Says he has has walked in the hall without dyspnea.    Objective:   Weight Range: 69 kg Body mass index is 23.13 kg/m.   Vital Signs:   Temp:  [97.6 F (36.4 C)-99.1 F (37.3 C)] 99.1 F (37.3 C) (11/25 0801) Pulse Rate:  [58-78] 70 (11/25 0801) Resp:  [16-22] 18 (11/25 0801) BP: (92-126)/(68-74) 92/68 (11/25 0801) SpO2:  [92 %-100 %] 98 % (11/25 0801) Weight:  [69 kg] 69 kg (11/25 0500) Last BM Date : 07/20/23  Weight change: Filed Weights   07/19/23 0700 07/20/23 0457 07/21/23 0500  Weight: 71.3 kg 69.2 kg 69 kg    Intake/Output:   Intake/Output Summary (Last 24 hours) at 07/21/2023 0806 Last data filed at 07/21/2023 0400 Gross per 24 hour  Intake 780 ml  Output 1592 ml  Net -812 ml      Physical Exam    General:  Well appearing. No resp difficulty HEENT: Normal Neck: Supple. JVP 8. Carotids 2+ bilat; no bruits. No lymphadenopathy or thyromegaly appreciated. Cor: PMI nondisplaced. Regular rate & rhythm. No rubs, gallops or murmurs. Lungs: Clear Abdomen: Soft, nontender, nondistended. No hepatosplenomegaly. No bruits or masses. Good bowel sounds. Extremities: No cyanosis, clubbing, rash, edema Neuro: Alert & orientedx3, cranial nerves grossly intact. moves all 4 extremities w/o difficulty. Affect pleasant   Telemetry   NSR 70s (personally reviewed)   Labs    CBC Recent Labs    07/20/23 0510 07/21/23 0420  WBC 6.6 6.8  HGB 11.9* 11.4*  HCT 35.8* 34.1*  MCV 97.5 97.4  PLT 200 219   Basic Metabolic Panel Recent Labs    82/95/62 0510 07/21/23 0420  NA 139 137  K 4.4 3.9  CL 103 103  CO2 29 26  GLUCOSE 106* 124*  BUN 30* 28*  CREATININE 1.11 1.20  CALCIUM 8.8* 8.3*  MG 2.5* 2.2   Liver Function Tests No results for input(s): "AST",  "ALT", "ALKPHOS", "BILITOT", "PROT", "ALBUMIN" in the last 72 hours. No results for input(s): "LIPASE", "AMYLASE" in the last 72 hours. Cardiac Enzymes No results for input(s): "CKTOTAL", "CKMB", "CKMBINDEX", "TROPONINI" in the last 72 hours.  BNP: BNP (last 3 results) Recent Labs    07/10/23 1700 07/14/23 1008  BNP 2,153.9* 1,517.1*    ProBNP (last 3 results) No results for input(s): "PROBNP" in the last 8760 hours.   D-Dimer No results for input(s): "DDIMER" in the last 72 hours. Hemoglobin A1C No results for input(s): "HGBA1C" in the last 72 hours. Fasting Lipid Panel No results for input(s): "CHOL", "HDL", "LDLCALC", "TRIG", "CHOLHDL", "LDLDIRECT" in the last 72 hours. Thyroid Function Tests No results for input(s): "TSH", "T4TOTAL", "T3FREE", "THYROIDAB" in the last 72 hours.  Invalid input(s): "FREET3"  Other results:   Imaging    No results found.   Medications:     Scheduled Medications:  apixaban  5 mg Oral BID   carvedilol  6.25 mg Oral BID WC   Chlorhexidine Gluconate Cloth  6 each Topical Daily   dapagliflozin propanediol  10 mg Oral Daily   furosemide  60 mg Oral Daily   gabapentin  300 mg Oral BID   iron polysaccharides  150 mg Oral Daily   OLANZapine zydis  5 mg Oral Once   pravastatin  20 mg Oral q1800   QUEtiapine  50 mg Oral BID   sacubitril-valsartan  1 tablet Oral BID   spironolactone  25 mg Oral Daily   tamsulosin  0.4 mg Oral QPC supper   Vitamin D (Ergocalciferol)  50,000 Units Oral Q7 days    Infusions:   PRN Medications: acetaminophen **OR** acetaminophen, guaiFENesin, hydrALAZINE, ipratropium-albuterol, magnesium hydroxide, ondansetron **OR** ondansetron (ZOFRAN) IV, oxyCODONE-acetaminophen, senna-docusate, traZODone    Assessment/Plan   1. 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  Initial diagnosis made in [place], sounds like he may have had a cardiac cath at that time with no significant  CAD.  Most recent prior echo with E 45-50% but was admitted in atrial flutter/RVR with echo showing Ef 20-25%, moderate LVH, moderate RV dysfunction, and moderate-severe TR.  He is now s/p TEE-guided DCCV.  Cardiac MRI showed LV E 26%, normal wall thickness, mid-wall basal septal LGE (nonspecific, seen with dilated cardiomyopathies), RV E 28%, ECV 33%.  Cardiac MRI was not suggestive of cardiac amyloidosis.  Possible tachycardia-mediated cardiomyopathy (or tachy-mediated worsening of pre-existing CMP).Marland Kitchen  He is not significantly volume overloaded on exam today.  - Continue Lasix 60 mg daily.  - Increase Entresto to 49/51 bid.  - Increase spironolactone to 25 mg daily.  - Continue Farxiga 10 mg daily.  - Continue Coreg 6.25 mg bid.  - Repeat echo in NSR after a couple months.  If not improved, should get LHC/RHC.  2. Atrial flutter: Now in NSR s/p DCCV.  Admitted with AFL with RVR.   - Continue apixaban.  - Should see EP as outpatient for consideration of ablation.  3. Acute delirium: Seems improved.  4. COPD: Active smoker.  Encouraged him to quit.  5. Disposition: Think he is ready for d/c from cardiac perspective, awaiting SNF.   Length of Stay: 54  Marca Ancona, MD  07/21/2023, 8:06 AM  Advanced Heart Failure Team Pager [phone] (M-F; 7a - 5p)  Please contact [facility] for night-coverage after hours (5p -7a ) and weekends on [email or web address]

## 2023-07-21 NOTE — Progress Notes (Addendum)
Pt called this RN to the room due to "stinging" in his left hip.  Upon assessment it was noted that the pt has 2 blistered areas on his left hip.  1 is fluid filled and intact, the other is open and has scant serous drainage.  When asked what happened the pt relates that he spilled his coffee on himself at breakfast this morning.  He states that his doctor was in the room at the time of the spill.  Wound documented and dressed with foam.  Will continue to monitor.

## 2023-07-21 NOTE — Consult Note (Addendum)
WOC Nurse Consult Note: please see WOC consult note from 07/14/2023  Reason for Consult: bilateral foot wounds  Wound type: full thickness ? R/t edema/uncontrolled CHF  Pressure Injury POA: NA  Measurement: 1.  R dorsal foot 2 cm x 6 cm x 0.1 cm 60% tan necrotic 40% pink moist; R medial foot full thickness 5 cm x 1 cm x 0.1 cm 50% pink moist 50% tan necrotic  2.  L lateral foot 3 cm x 2 cm x 0.1 cm 60% pink moist 40% brown necrotic; L lower posterior leg 4 cm x 3 cm x 0.1 cm 75% pink moist 25% tan  Wound bed: as above  Drainage (amount, consistency, odor) heavy tan exudate  Periwound: edema peeling skin  Dressing procedure/placement/frequency: Cleanse B feet and L lower posterior leg wounds with Vashe wound cleanser [place] [phone]), apply Medihoney to wound beds daily, cover with dry gauze, Kerlix roll gauze and tape   Discharge planning is for patient to go to SNF.    POC discussed with patient, bedside nurse and primary MD.    Thank you,    Priscella Mann MSN, RN-BC, CWOCN [phone]

## 2023-07-21 NOTE — Progress Notes (Signed)
Bladder scan result 700 cc. Patient refusing for RN or NT to insert foley catheter and refusing for in and out cath to be performed. Notified Dr. Lucianne Muss that patient is refusing. MD asked RN to keep trying to convince patient to allow catheter to be placed. Patient states he wants to wait until tomorrow and when RN stated "we can't wait that long your bladder could explode." Patient states he wants to wait until 8 o'clock tonight. RN made patient aware that it can not wait that long. RN will leave room and attempt again at later time.

## 2023-07-21 NOTE — Progress Notes (Signed)
Physical Therapy Treatment Patient Details Name: Kevin [name] MRN: [id number] DOB: 11-25-1955 Today's Date: 07/21/2023   History of Present Illness Pt is a 67 y/o admitted 07/10/23 for acute on chronic combined systolic and diastolic CHF and a flutter. PMH includes: HTN, peripheral neuropathy, dyslipidemia, and COPD.    PT Comments  Pt received in bed and agreed to PT session. Pt performed bed mobility SUP, STS with the use of RW (2wheels) CGA, and amb ~225ft in hallway with RW CGA. VC necessary throughout session to assist with the reinforcement of upright posture during amb as pt presented FHRS/trunk flexed. Pt tolerated Tx well today, however continues to demonstrate inconsistency with participation and activity tolerance per OT/PT sessions. Pt will continue to benefit from skilled PT sessions to improve strength and functional mobility to maximize safety/return to PLOF following D/C.    If plan is discharge home, recommend the following: A little help with walking and/or transfers;Direct supervision/assist for medications management;Supervision due to cognitive status;Assistance with cooking/housework;Assistance with feeding   Can travel by private vehicle        Equipment Recommendations  Rolling walker (2 wheels)    Recommendations for Other Services       Precautions / Restrictions Precautions Precautions: Fall Restrictions Weight Bearing Restrictions: No     Mobility  Bed Mobility Overal bed mobility: Needs Assistance Bed Mobility: Supine to Sit     Supine to sit: Supervision     General bed mobility comments: Pt performed bed mobility SUP and did not report any s/sx relative to dizziness.    Transfers Overall transfer level: Needs assistance Equipment used: Rolling walker (2 wheels) Transfers: Sit to/from Stand Sit to Stand: Contact guard assist           General transfer comment: Pt performed STS with the use of RW (2wheels) and did not report any s/sx  relative to dizziness.    Ambulation/Gait Ambulation/Gait assistance: Contact guard assist Gait Distance (Feet): 200 Feet Assistive device: Rolling walker (2 wheels) Gait Pattern/deviations: Step-through pattern Gait velocity: WNL     General Gait Details: Pt amb in hallway with the use of RW (2wheels) CGA. VC necessary for posture.   Stairs             Wheelchair Mobility     Tilt Bed    Modified Rankin (Stroke Patients Only)       Balance Overall balance assessment: Needs assistance Sitting-balance support: Feet supported Sitting balance-Leahy Scale: Good     Standing balance support: Bilateral upper extremity supported, Reliant on assistive device for balance, During functional activity Standing balance-Leahy Scale: Fair                              Cognition Arousal: Alert Behavior During Therapy: WFL for tasks assessed/performed Overall Cognitive Status: No family/caregiver present to determine baseline cognitive functioning                                 General Comments: AOx4. Pt pleasant and willing to participate in PT session.        Exercises      General Comments        Pertinent Vitals/Pain Pain Assessment Pain Assessment: No/denies pain    Home Living  Prior Function            PT Goals (current goals can now be found in the care plan section) Acute Rehab PT Goals Patient Stated Goal: Goal not stated PT Goal Formulation: Patient unable to participate in goal setting Time For Goal Achievement: 08/02/23 Potential to Achieve Goals: Good Progress towards PT goals: Progressing toward goals    Frequency    Min 1X/week      PT Plan      Co-evaluation              AM-PAC PT "6 Clicks" Mobility   Outcome Measure  Help needed turning from your back to your side while in a flat bed without using bedrails?: None Help needed moving from lying on your back  to sitting on the side of a flat bed without using bedrails?: None Help needed moving to and from a bed to a chair (including a wheelchair)?: None Help needed standing up from a chair using your arms (e.g., wheelchair or bedside chair)?: None Help needed to walk in hospital room?: A Lot Help needed climbing 3-5 steps with a railing? : A Little 6 Click Score: 21    End of Session Equipment Utilized During Treatment: Gait belt Activity Tolerance: Patient tolerated treatment well Patient left: in chair;with call bell/phone within reach;with chair alarm set Nurse Communication: Mobility status PT Visit Diagnosis: Other abnormalities of gait and mobility (R26.89);Difficulty in walking, not elsewhere classified (R26.2);Adult, failure to thrive (R62.7);Other (comment) (Cognitive deficits which have limited his saftey awareness.)     Time: 1131-1150 PT Time Calculation (min) (ACUTE ONLY): 19 min  Charges:    $Gait Training: 8-22 mins PT General Charges $$ ACUTE PT VISIT: 1 Visit                     Kevin [name] SPT, LAT, ATC   Kevin [name] 07/21/2023, 1:41 PM

## 2023-07-21 NOTE — Progress Notes (Signed)
[facility] Progress Note  Patient: Kevin [name]    WUJ:[id number]  DOA: 07/10/2023     Date of Service: the patient was seen and examined on 07/21/2023  Chief Complaint  Patient presents with   Leg Swelling   Brief hospital course: 67 year old with CHF with reduced EF, COPD, DM 2, HTN comes to the ED with worsening dyspnea on exertion and lower extremity swelling.  Patient was found to be in CHF exacerbation started on IV diuretics.  Repeat echo shows EF 25%.  Occasionally declining medication but family is helpful. GDMT and diuresis per cardiology.    Assessment and Plan:  Principal Problem:   Acute on chronic combined systolic and diastolic CHF (congestive heart failure) (HCC) Active Problems:   Hypokalemia   Essential hypertension   Peripheral neuropathy   Dyslipidemia    Acute on chronic biventricular systolic CHF (congestive heart failure)  EF 45% Bilateral lower extremity swelling with some blistering Medication noncompliance, previous echo 45% now 25%. 11/16 TTE LVEF 20 to 25%, severely decreased LV function no regional wall motion abnormality, diastolic parameters are indeterminate.  Right ventricular systolic function is moderately reduced. Cardiac MRI showed dilated nonischemic cardiomyopathy, biventricular failure. - GDMT and diuretics per cardiology. Coreg 6.25 mg p.o. twice daily, Farxiga 10 mg p.o. daily, digoxin 1.25 mcg p.o. daily 11/25 Increased Entresto 49-51 mg p.o. twice daily, Aldactone 25 mg p.o. daily,  S/p Lasix 80 mg IV BID till 11/20 am dose. And on 11/22 started Lasix 60 mg pod S/p Empiric Keflex for 5 days for possible LE cellulitis. (EOT 11/19) BNP 1517 elevated  Atrial flutter with RVR s/p cardioversion, converted to sinus rhythm TSH normal, s/p Heparin drip d/c'd on 11/19 Continue Coreg 11/19 started Eliquis 5 mg p.o. twice daily and digoxin 0.125 mg p.o. daily 11/21 s/p TEE/DCCV, verted to sinus rhythm   Urinary retention, causing  elevated creatinine level In-N-Out cath was done twice as per RN on 11/18 and patient refused indwelling Foley catheter.   11/19 Pt was unable to void, had overflow incontinence. agreed for Foley catheter insertion. 11/22 Cr 1.06 continue to monitor D/w urologist Dr. Lonna Cobb, recommended to keep Foley for 1 week and follow-up as an outpatient. Started Flomax 0.4 mg p.o. daily 11/23 last night patient got confused and he pulled out IV line and pulled out Foley catheter as well. Patient is still retaining urine and refusing Foley catheter insertion.  RN was advised to continue bladder scan and reinsert Foley catheter when patient agrees. 11/25 bladder scan 658 cc, In-N-Out catheter was done and 700 mL urine was collected.  Patient refused indwelling Foley catheter insertion.  Consulted, patient is still thinking about it. 11/25 increased to Flomax 0.8 mg p.o. nightly and started Proscar.  Hypokalemia, potassium repleted.  Resolved. Monitor electrolytes and replete as needed.  Dyslipidemia -Pravastatin   COPD with tobacco use -As needed bronchodilators     Peripheral neuropathy -Gabapentin   Essential hypertension Continue Coreg, Entresto and Aldactone Blood pressure soft, continue to monitor and titrate medication accordingly Cardiology following    Mild macrocytic anemia  Transferrin saturation 16%, slightly low, folate within normal range.  B12 level 1477 elevated Started oral iron supplement  Vitamin D deficiency: started vitamin D 50,000 units p.o. weekly, follow with PCP to repeat vitamin D level after 3 to 6 months.  Declining medications occasionally but sisters were helpful.   Acute delirium and mild cognitive decline, unable to understand his medical situation. Patient was started on Seroquel 25 mg p.o.  twice daily, on 11/23 increased dose Seroquel 50 mg p.o. twice daily. Haldol 2 mg IV one-time dose given on 11/23  PT/OT-HH.  Face-to-face completed   Body mass  index is 23.13 kg/m.  Interventions:  Diet: Heart healthy diet DVT Prophylaxis: Therapeutic Anticoagulation with Eliquis    Advance goals of care discussion: Full code  Family Communication: family was not present at bedside, at the time of interview.  The pt provided permission to discuss medical plan with the family. Opportunity was given to ask question and all questions were answered satisfactorily.   Disposition:  Pt is from home, admitted with CHF, developed atrial flutter,  s/p TEE and cardioversion, s/p IV lasix, and Hep gtt, clinically stable, medically optimized to discharge. Follow TOC for discharge planning, patient needs SNF placement.   Subjective: No significant events overnight, patient was lying comfortably, and he was dozing off, sleepy.  Patient was counseled regarding Foley catheter insertion but he is still refusing.  Patient denied any chest pain or palpitation, no shortness of breath.   Physical Exam: General: NAD, lying comfortably Appear in no distress, affect appropriate Eyes: PERRLA ENT: Oral Mucosa Clear, moist  Neck: no JVD,  Cardiovascular: SR, S1-S2 audible, no Murmur,  Respiratory: good respiratory effort, Bilateral Air entry equal and Decreased, no Crackles, no wheezes Abdomen: Bowel Sound present, Soft and no tenderness,  Skin: no rashes Extremities: mild edema, no calf tenderness.  Edema almost resolved Neurologic: without any new focal findings Gait not checked due to patient safety concerns  Vitals:   07/21/23 0801 07/21/23 0918 07/21/23 0920 07/21/23 1142  BP: 92/68 120/69  (!) 160/120  Pulse: 70 63 61 70  Resp: 18 15  18   Temp: 99.1 F (37.3 C)   98.3 F (36.8 C)  TempSrc: Oral   Oral  SpO2: 98% 100%  100%  Weight:      Height:        Intake/Output Summary (Last 24 hours) at 07/21/2023 1252 Last data filed at 07/21/2023 0855 Gross per 24 hour  Intake 540 ml  Output 2117 ml  Net -1577 ml   Filed Weights   07/19/23 0700  07/20/23 0457 07/21/23 0500  Weight: 71.3 kg 69.2 kg 69 kg    Data Reviewed: I have personally reviewed and interpreted daily labs, tele strips, imagings as discussed above. I reviewed all nursing notes, pharmacy notes, vitals, pertinent old records I have discussed plan of care as described above with RN and patient/family.  CBC: Recent Labs  Lab 07/17/23 0633 07/18/23 0457 07/19/23 0434 07/20/23 0510 07/21/23 0420  WBC 9.5 7.7 8.9 6.6 6.8  HGB 11.2* 11.0* 11.5* 11.9* 11.4*  HCT 33.5* 33.3* 34.8* 35.8* 34.1*  MCV 97.1 98.8 100.0 97.5 97.4  PLT 170 172 175 200 219   Basic Metabolic Panel: Recent Labs  Lab 07/15/23 0453 07/16/23 0512 07/17/23 0633 07/17/23 1420 07/18/23 0457 07/19/23 0434 07/20/23 0510 07/21/23 0420  NA 136 140 134* 134* 133* 134* 139 137  K 4.2 4.8 5.0 4.7 4.7 4.4 4.4 3.9  CL 100 104 98 102 98 102 103 103  CO2 29 29 26 22 25 27 29 26   GLUCOSE 103* 104* 100* 70 108* 112* 106* 124*  BUN 28* 28* 36* 34* 33* 30* 30* 28*  CREATININE 1.29* 1.18 1.35* 1.21 1.06 1.11 1.11 1.20  CALCIUM 8.2* 8.7* 8.2* 8.2* 8.4* 8.4* 8.8* 8.3*  MG 2.0 2.0 1.9  --  2.1 2.1 2.5* 2.2  PHOS 2.8 3.1 3.9 3.9  --   --   --   --  Studies: No results found.  Scheduled Meds:  apixaban  5 mg Oral BID   carvedilol  6.25 mg Oral BID WC   Chlorhexidine Gluconate Cloth  6 each Topical Daily   dapagliflozin propanediol  10 mg Oral Daily   digoxin  0.125 mg Oral Daily   finasteride  5 mg Oral QPM   furosemide  60 mg Oral Daily   gabapentin  300 mg Oral BID   iron polysaccharides  150 mg Oral Daily   OLANZapine zydis  5 mg Oral Once   pravastatin  20 mg Oral q1800   QUEtiapine  50 mg Oral BID   sacubitril-valsartan  1 tablet Oral BID   spironolactone  25 mg Oral Daily   tamsulosin  0.8 mg Oral QPC supper   Vitamin D (Ergocalciferol)  50,000 Units Oral Q7 days   Continuous Infusions:   PRN Meds: acetaminophen **OR** acetaminophen, guaiFENesin, hydrALAZINE,  ipratropium-albuterol, magnesium hydroxide, ondansetron **OR** ondansetron (ZOFRAN) IV, oxyCODONE-acetaminophen, senna-docusate, traZODone  Time spent: 40 minutes  Author: Gillis Santa. MD [facility] 07/21/2023 12:52 PM  To reach On-call, see care teams to locate the attending and reach out to them via [email or web address]. If 7PM-7AM, please contact night-coverage If you still have difficulty reaching the attending provider, please page the [facility] (Director on Call) for [facility] on amion for assistance.

## 2023-07-21 NOTE — Plan of Care (Signed)
  Problem: Clinical Measurements: Goal: Will remain free from infection Outcome: Progressing   Problem: Nutrition: Goal: Adequate nutrition will be maintained Outcome: Progressing   Problem: Safety: Goal: Ability to remain free from injury will improve Outcome: Progressing   Problem: Cardiac: Goal: Ability to achieve and maintain adequate cardiopulmonary perfusion will improve Outcome: Progressing

## 2023-07-21 NOTE — Progress Notes (Signed)
Bladder scanned and 658 cc result. Patient refuses foley catheter insertion. Educated patient on benefit of foley insertion opposed to frequent in and out catheterization. Patient verbalized understanding but still refuses foley insertion. In and out cath performed with 700 cc of urine returned.

## 2023-07-22 DIAGNOSIS — I5043 Acute on chronic combined systolic (congestive) and diastolic (congestive) heart failure: Secondary | ICD-10-CM | POA: Diagnosis not present

## 2023-07-22 LAB — BASIC METABOLIC PANEL
Anion gap: 6 (ref 5–15)
BUN: 24 mg/dL — ABNORMAL HIGH (ref 8–23)
CO2: 28 mmol/L (ref 22–32)
Calcium: 8.1 mg/dL — ABNORMAL LOW (ref 8.9–10.3)
Chloride: 105 mmol/L (ref 98–111)
Creatinine, Ser: 1 mg/dL (ref 0.61–1.24)
GFR, Estimated: >60 mL/min (ref >60)
Glucose, Bld: 100 mg/dL — ABNORMAL HIGH (ref 70–99)
Potassium: 4 mmol/L (ref 3.5–5.1)
Sodium: 139 mmol/L (ref 135–145)

## 2023-07-22 LAB — CBC
HCT: 35.9 % — ABNORMAL LOW (ref 39.0–52.0)
Hemoglobin: 11.8 g/dL — ABNORMAL LOW (ref 13.0–17.0)
MCH: 32.6 pg (ref 26.0–34.0)
MCHC: 32.9 g/dL (ref 30.0–36.0)
MCV: 99.2 fL (ref 80.0–100.0)
Platelets: 228 10*3/uL (ref 150–400)
RBC: 3.62 MIL/uL — ABNORMAL LOW (ref 4.22–5.81)
RDW: 14.3 % (ref 11.5–15.5)
WBC: 7.1 10*3/uL (ref 4.0–10.5)
nRBC: 0 % (ref 0.0–0.2)

## 2023-07-22 LAB — MAGNESIUM: Magnesium: 2 mg/dL (ref 1.7–2.4)

## 2023-07-22 LAB — DIGOXIN LEVEL: Digoxin Level: 0.8 ng/mL (ref 0.8–2.0)

## 2023-07-22 MED ORDER — MEDIHONEY WOUND/BURN DRESSING EX PSTE
1.0000 | PASTE | Freq: Every day | CUTANEOUS | Status: DC
  Administered 2023-07-22 – 2023-07-31 (×10): 1 via TOPICAL
  Filled 2023-07-22 (×2): qty 44

## 2023-07-22 NOTE — TOC Progression Note (Signed)
Transition of Care [facility]) - Progression Note    Patient Details  Name: Kevin [name] MRN: [id number] Date of Birth: May 23, 1956  Transition of Care [facility]) CM/SW Contact  Truddie Hidden, RN Phone Number: 07/22/2023, 11:26 AM  Clinical Narrative:    Attempt to reach patient's sister, Judeth Cornfield. No answer. VM box full. Unable to leave a message.   Spoke with patient's sister, Steward Drone regarding therapy's recommendation for SNF. Patient is alert and oriented x2. Steward Drone inquired about why patient's cognition has changed from yesterday. She is requesting to speak with MD. Steward Drone had concerns of "Throwing" the patient into a SNF. RNCM advised therapy's recommendation for LOC at discharge is not based on his current cognition.  MD notified.         Expected Discharge Plan and Services                                   HH Arranged: RN, PT, OT [facility] Agency: [facility] Date [facility] Agency Contacted: 07/13/23 Time HH Agency Contacted: [phone] Representative spoke with at [facility] Agency: Confirmed with Kandee Keen   Social Determinants of Health (SDOH) Interventions SDOH Screenings   Food Insecurity: No Food Insecurity (07/11/2023)  Housing: Low Risk  (07/14/2023)  Transportation Needs: No Transportation Needs (07/14/2023)  Recent Concern: Transportation Needs - Unmet Transportation Needs (07/11/2023)  Utilities: Not At Risk (07/11/2023)  Alcohol Screen: Low Risk  (07/14/2023)  Depression (PHQ2-9): Low Risk  (09/18/2021)  Financial Resource Strain: Low Risk  (07/14/2023)  Physical Activity: Insufficiently Active (01/20/2020)  Social Connections: Unknown (01/20/2020)  Stress: No Stress Concern Present (08/04/2019)  Tobacco Use: High Risk (07/11/2023)    Readmission Risk Interventions     No data to display

## 2023-07-22 NOTE — Progress Notes (Signed)
[facility] Progress Note  Patient: Kevin [name]    ION:[id number]  DOA: 07/10/2023     Date of Service: the patient was seen and examined on 07/22/2023  Chief Complaint  Patient presents with   Leg Swelling   Brief hospital course: 67 year old with CHF with reduced EF, COPD, DM 2, HTN comes to the ED with worsening dyspnea on exertion and lower extremity swelling.  Patient was found to be in CHF exacerbation started on IV diuretics.  Repeat echo shows EF 25%.  Occasionally declining medication but family is helpful. GDMT and diuresis per cardiology.    Assessment and Plan:  Principal Problem:   Acute on chronic combined systolic and diastolic CHF (congestive heart failure) (HCC) Active Problems:   Hypokalemia   Essential hypertension   Peripheral neuropathy   Dyslipidemia   # Acute on chronic biventricular systolic CHF (congestive heart failure)  EF 20-25% Bilateral lower extremity swelling with some blistering. Edema resolved Medication noncompliance, previous echo 45% now 25%. 11/16 TTE LVEF 20 to 25%, severely decreased LV function no regional wall motion abnormality, diastolic parameters are indeterminate.  Right ventricular systolic function is moderately reduced. Cardiac MRI showed dilated nonischemic cardiomyopathy, biventricular failure. - GDMT and diuretics per cardiology. Coreg 6.25 mg p.o. twice daily, Farxiga 10 mg p.o. daily, digoxin 1.25 mcg p.o. daily 11/25 Increased Entresto 49-51 mg p.o. twice daily, Aldactone 25 mg p.o. daily,  S/p Lasix 80 mg IV BID till 11/20 am dose. And on 11/22 started Lasix 60 mg pod S/p Empiric Keflex for 5 days for possible LE cellulitis. (EOT 11/19) BNP 1517 elevated  Atrial flutter with RVR s/p cardioversion, converted to sinus rhythm TSH normal, s/p Heparin drip d/c'd on 11/19 Continue Coreg 11/19 started Eliquis 5 mg p.o. twice daily and digoxin 0.125 mg p.o. daily 11/21 s/p TEE/DCCV, converted to sinus rhythm   Urinary  retention, causing elevated creatinine level In-N-Out cath was done twice as per RN on 11/18 and patient refused indwelling Foley catheter.   11/19 Pt was unable to void, had overflow incontinence. agreed for Foley catheter insertion. 11/22 Cr 1.06 continue to monitor D/w urologist Dr. Lonna Cobb, recommended to keep Foley for 1 week and follow-up as an outpatient. Started Flomax 0.4 mg p.o. daily 11/23 last night patient got confused and he pulled out IV line and cut his Foley catheter with scissors. Patient is still retaining urine and refusing Foley catheter insertion.  RN was advised to continue bladder scan and reinsert Foley catheter when patient agrees. 11/25 bladder scan 658 cc, In-N-Out catheter was done and 700 mL urine was collected.  Patient refused indwelling Foley catheter insertion.  Consulted, patient is still thinking about it. 11/25 increased to Flomax 0.8 mg p.o. nightly and started Proscar. 11/26 Foley catheter was inserted last night, finally patient agreed, continue to keep Foley catheter for 7 days and then follow voiding trial  Hypokalemia, potassium repleted.  Resolved. Monitor electrolytes and replete as needed.  Dyslipidemia -Pravastatin   COPD with tobacco use -As needed bronchodilators     Peripheral neuropathy -Gabapentin   Essential hypertension Continue Coreg, Entresto and Aldactone Blood pressure soft, continue to monitor and titrate medication accordingly Cardiology following    Mild macrocytic anemia  Transferrin saturation 16%, slightly low, folate within normal range.  B12 level 1477 elevated Started oral iron supplement  Vitamin D deficiency: started vitamin D 50,000 units p.o. weekly, follow with PCP to repeat vitamin D level after 3 to 6 months.  Declining medications occasionally but  sisters were helpful.  However patient is taking his medications, gradually understanding.   Acute delirium and mild cognitive decline, unable to understand  his medical situation. Patient was started on Seroquel 25 mg p.o. twice daily, on 11/23 increased dose Seroquel 50 mg p.o. twice daily. Haldol 2 mg IV one-time dose given on 11/23   Body mass index is 23.23 kg/m.  Interventions:  Diet: Heart healthy diet DVT Prophylaxis: Therapeutic Anticoagulation with Eliquis    Advance goals of care discussion: Full code  Family Communication: family was not present at bedside, at the time of interview.  The pt provided permission to discuss medical plan with the family. Opportunity was given to ask question and all questions were answered satisfactorily.  11/26 management plan discussed with patient's sister over the phone, all question and concerns answered.  Disposition:  Pt is from home, admitted with CHF, developed atrial flutter,  s/p TEE and cardioversion, s/p IV lasix, and Hep gtt, clinically stable, medically optimized to discharge. Follow TOC for discharge planning, patient needs SNF placement.   Subjective: No significant events overnight, patient finally agreed for Foley catheter insertion last night which was inserted, patient slept well.  Denied any chest pain or palpitation no shortness of breath, feeling better.  No complaints.   Physical Exam: General: NAD, lying comfortably Appear in no distress, affect appropriate Eyes: PERRLA ENT: Oral Mucosa Clear, moist  Neck: no JVD,  Cardiovascular: SR, S1-S2 audible, no Murmur,  Respiratory: good respiratory effort, Bilateral Air entry equal and Decreased, no Crackles, no wheezes Abdomen: Bowel Sound present, Soft and no tenderness,  Skin: Full-thickness wound dorsum of bilateral feet, continue dressing as per wound care RN Extremities: mild edema, no calf tenderness.  Edema almost resolved Neurologic: without any new focal findings Gait not checked due to patient safety concerns  Vitals:   07/22/23 0424 07/22/23 0442 07/22/23 0756 07/22/23 1203  BP:  112/78 118/75 (!) 89/66   Pulse:  62 64 68  Resp:  14    Temp:  98.8 F (37.1 C) 98.1 F (36.7 C) 97.7 F (36.5 C)  TempSrc:  Oral    SpO2:  100% 99% 99%  Weight: 69.3 kg     Height:        Intake/Output Summary (Last 24 hours) at 07/22/2023 1417 Last data filed at 07/22/2023 0600 Gross per 24 hour  Intake --  Output 2450 ml  Net -2450 ml   Filed Weights   07/20/23 0457 07/21/23 0500 07/22/23 0424  Weight: 69.2 kg 69 kg 69.3 kg    Data Reviewed: I have personally reviewed and interpreted daily labs, tele strips, imagings as discussed above. I reviewed all nursing notes, pharmacy notes, vitals, pertinent old records I have discussed plan of care as described above with RN and patient/family.  CBC: Recent Labs  Lab 07/18/23 0457 07/19/23 0434 07/20/23 0510 07/21/23 0420 07/22/23 0433  WBC 7.7 8.9 6.6 6.8 7.1  HGB 11.0* 11.5* 11.9* 11.4* 11.8*  HCT 33.3* 34.8* 35.8* 34.1* 35.9*  MCV 98.8 100.0 97.5 97.4 99.2  PLT 172 175 200 219 228   Basic Metabolic Panel: Recent Labs  Lab 07/16/23 0512 07/17/23 0633 07/17/23 1420 07/18/23 0457 07/19/23 0434 07/20/23 0510 07/21/23 0420 07/22/23 0433  NA 140 134* 134* 133* 134* 139 137 139  K 4.8 5.0 4.7 4.7 4.4 4.4 3.9 4.0  CL 104 98 102 98 102 103 103 105  CO2 29 26 22 25 27 29 26 28   GLUCOSE 104* 100* 70  108* 112* 106* 124* 100*  BUN 28* 36* 34* 33* 30* 30* 28* 24*  CREATININE 1.18 1.35* 1.21 1.06 1.11 1.11 1.20 1.00  CALCIUM 8.7* 8.2* 8.2* 8.4* 8.4* 8.8* 8.3* 8.1*  MG 2.0 1.9  --  2.1 2.1 2.5* 2.2 2.0  PHOS 3.1 3.9 3.9  --   --   --   --   --     Studies: No results found.  Scheduled Meds:  apixaban  5 mg Oral BID   carvedilol  6.25 mg Oral BID WC   Chlorhexidine Gluconate Cloth  6 each Topical Daily   dapagliflozin propanediol  10 mg Oral Daily   digoxin  0.125 mg Oral Daily   finasteride  5 mg Oral QPM   furosemide  60 mg Oral Daily   gabapentin  300 mg Oral BID   iron polysaccharides  150 mg Oral Daily   leptospermum manuka  honey  1 Application Topical Daily   OLANZapine zydis  5 mg Oral Once   pravastatin  20 mg Oral q1800   QUEtiapine  50 mg Oral BID   sacubitril-valsartan  1 tablet Oral BID   spironolactone  25 mg Oral Daily   tamsulosin  0.8 mg Oral QPC supper   Vitamin D (Ergocalciferol)  50,000 Units Oral Q7 days   Continuous Infusions:   PRN Meds: acetaminophen **OR** acetaminophen, guaiFENesin, hydrALAZINE, ipratropium-albuterol, magnesium hydroxide, ondansetron **OR** ondansetron (ZOFRAN) IV, oxyCODONE-acetaminophen, senna-docusate, traZODone  Time spent: 40 minutes  Author: Gillis Santa. MD [facility] 07/22/2023 2:17 PM  To reach On-call, see care teams to locate the attending and reach out to them via [email or web address]. If 7PM-7AM, please contact night-coverage If you still have difficulty reaching the attending provider, please page the [facility] (Director on Call) for [facility] on amion for assistance.

## 2023-07-22 NOTE — Progress Notes (Signed)
Occupational Therapy Treatment Patient Details Name: Kevin [name] MRN: [id number] DOB: 1956/06/15 Today's Date: 07/22/2023   History of present illness Pt is a 67 y/o admitted 07/10/23 for acute on chronic combined systolic and diastolic CHF and a flutter. PMH includes: HTN, peripheral neuropathy, dyslipidemia, and COPD.   OT comments  Chart reviewed to date, pt greeted in bed, agreeable to OT tx session targeting improving ADL independence. Pt is alert, oriented to self and place, not oriented to date or situation. Pt is making progress in activity tolerance/ ADL performance however continues to require CGA for amb in room with RW, MIN A for dynamic grooming standing tasks at sink level. MAX A required for LB dressing. Frequent vcs throughout for appropriate posture/technique with RW. Pt is left in chair, all needs met. Vss throughout with no c/o dizziness throughout. OT will continue to follow acutely.       If plan is discharge home, recommend the following:  A little help with walking and/or transfers;Help with stairs or ramp for entrance;Assist for transportation;Assistance with cooking/housework;A lot of help with bathing/dressing/bathroom;Direct supervision/assist for financial management;Supervision due to cognitive status;Direct supervision/assist for medications management   Equipment Recommendations  Other (comment);Tub/shower bench (RW)    Recommendations for Other Services      Precautions / Restrictions Precautions Precautions: Fall Restrictions Weight Bearing Restrictions: No       Mobility Bed Mobility Overal bed mobility: Needs Assistance Bed Mobility: Supine to Sit     Supine to sit: Supervision, HOB elevated          Transfers Overall transfer level: Needs assistance Equipment used: Rolling walker (2 wheels) Transfers: Sit to/from Stand Sit to Stand: Contact guard assist                 Balance Overall balance assessment: Needs  assistance Sitting-balance support: Feet supported Sitting balance-Leahy Scale: Good     Standing balance support: Bilateral upper extremity supported, Reliant on assistive device for balance, During functional activity Standing balance-Leahy Scale: Fair                             ADL either performed or assessed with clinical judgement   ADL Overall ADL's : Needs assistance/impaired     Grooming: Oral care;Wash/dry hands;Standing;Contact guard assist;Minimal assistance Grooming Details (indicate cue type and reason): with RW at sink level, MIN A with dynamic standing tasks             Lower Body Dressing: Maximal assistance;Sitting/lateral leans Lower Body Dressing Details (indicate cue type and reason): B socks on this date Toilet Transfer: Contact guard assist;Rolling walker (2 wheels);Ambulation Toilet Transfer Details (indicate cue type and reason): simulated to beside commode, frequent vcs for posture/RW use         Functional mobility during ADLs: Contact guard assist;Rolling walker (2 wheels) (approx 20' in room with RW with frequent vcs for technique/posture)      Extremity/Trunk Assessment              Vision       Perception     Praxis      Cognition Arousal: Alert Behavior During Therapy: WFL for tasks assessed/performed Overall Cognitive Status: No family/caregiver present to determine baseline cognitive functioning Area of Impairment: Safety/judgement, Orientation, Attention, Following commands, Awareness, Problem solving                 Orientation Level: Disoriented to, Time, Situation Current Attention Level: Focused  Memory: Decreased short-term memory Following Commands: Follows one step commands consistently, Follows one step commands with increased time Safety/Judgement: Decreased awareness of safety, Decreased awareness of deficits Awareness: Intellectual Problem Solving: Slow processing, Requires verbal cues           Exercises Other Exercises Other Exercises: edu re: importance of continued progressing mobility    Shoulder Instructions       General Comments B feet wrapped, blister on L hip, no c/o dizziness throughout; spo2 >97% on RA, HR in 80s bpm after mobility    Pertinent Vitals/ Pain       Pain Assessment Pain Assessment: No/denies pain  Home Living                                          Prior Functioning/Environment              Frequency  Min 1X/week        Progress Toward Goals  OT Goals(current goals can now be found in the care plan section)  Progress towards OT goals: Progressing toward goals  Acute Rehab OT Goals Time For Goal Achievement: 07/25/23  Plan      Co-evaluation                 AM-PAC OT "6 Clicks" Daily Activity     Outcome Measure   Help from another person eating meals?: None Help from another person taking care of personal grooming?: None Help from another person toileting, which includes using toliet, bedpan, or urinal?: A Little Help from another person bathing (including washing, rinsing, drying)?: A Little Help from another person to put on and taking off regular upper body clothing?: None Help from another person to put on and taking off regular lower body clothing?: A Lot 6 Click Score: 20    End of Session Equipment Utilized During Treatment: Rolling walker (2 wheels)  OT Visit Diagnosis: Other abnormalities of gait and mobility (R26.89);Unsteadiness on feet (R26.81)   Activity Tolerance Patient tolerated treatment well   Patient Left with chair alarm set;in chair;with call bell/phone within reach   Nurse Communication Mobility status        Time: 1610-9604 OT Time Calculation (min): 19 min  Charges: OT General Charges $OT Visit: 1 Visit OT Treatments $Self Care/Home Management : 8-22 mins  Oleta Mouse, OTD OTR/L  07/22/23, 12:56 PM

## 2023-07-22 NOTE — Progress Notes (Addendum)
Patient ID: Kevin [name], male   DOB: [birth year], 67 y.o.   MRN: [id number]     Advanced Heart Failure Rounding Note  PCP-Cardiologist: Debbe Odea, MD   Subjective:    Feels ok this morning. Pleasant. Able to get rest last night.   Objective:   Weight Range: 69.3 kg Body mass index is 23.23 kg/m.   Vital Signs:   Temp:  [98 F (36.7 C)-99.1 F (37.3 C)] 98.8 F (37.1 C) (11/26 0442) Pulse Rate:  [61-72] 62 (11/26 0442) Resp:  [14-18] 14 (11/26 0442) BP: (92-160)/(64-120) 112/78 (11/26 0442) SpO2:  [97 %-100 %] 100 % (11/26 0442) Weight:  [69.3 kg] 69.3 kg (11/26 0424) Last BM Date : 07/20/23  Weight change: Filed Weights   07/20/23 0457 07/21/23 0500 07/22/23 0424  Weight: 69.2 kg 69 kg 69.3 kg    Intake/Output:   Intake/Output Summary (Last 24 hours) at 07/22/2023 0740 Last data filed at 07/22/2023 0600 Gross per 24 hour  Intake --  Output 3225 ml  Net -3225 ml      Physical Exam    General:  elderly appearing.  No respiratory difficulty HEENT: normal Neck: supple. JVD ~7 cm. Carotids 2+ bilat; no bruits. No lymphadenopathy or thyromegaly appreciated. Cor: PMI nondisplaced. Regular rate & rhythm. No rubs, gallops or murmurs. Lungs: clear, diminished bases Abdomen: soft, nontender, nondistended. No hepatosplenomegaly. No bruits or masses. Good bowel sounds. Extremities: no cyanosis, clubbing, rash, edema. Feet wrapped.  Neuro: alert & oriented x 3, cranial nerves grossly intact. moves all 4 extremities w/o difficulty. Affect pleasant.    Telemetry   NSR 60s, 14 beat NSVT (personally reviewed)  Labs    CBC Recent Labs    07/21/23 0420 07/22/23 0433  WBC 6.8 7.1  HGB 11.4* 11.8*  HCT 34.1* 35.9*  MCV 97.4 99.2  PLT 219 228   Basic Metabolic Panel Recent Labs    19/14/78 0420 07/22/23 0433  NA 137 139  K 3.9 4.0  CL 103 105  CO2 26 28  GLUCOSE 124* 100*  BUN 28* 24*  CREATININE 1.20 1.00  CALCIUM 8.3* 8.1*  MG 2.2 2.0   Liver  Function Tests No results for input(s): "AST", "ALT", "ALKPHOS", "BILITOT", "PROT", "ALBUMIN" in the last 72 hours. No results for input(s): "LIPASE", "AMYLASE" in the last 72 hours. Cardiac Enzymes No results for input(s): "CKTOTAL", "CKMB", "CKMBINDEX", "TROPONINI" in the last 72 hours.  BNP: BNP (last 3 results) Recent Labs    07/10/23 1700 07/14/23 1008  BNP 2,153.9* 1,517.1*    ProBNP (last 3 results) No results for input(s): "PROBNP" in the last 8760 hours.   D-Dimer No results for input(s): "DDIMER" in the last 72 hours. Hemoglobin A1C No results for input(s): "HGBA1C" in the last 72 hours. Fasting Lipid Panel No results for input(s): "CHOL", "HDL", "LDLCALC", "TRIG", "CHOLHDL", "LDLDIRECT" in the last 72 hours. Thyroid Function Tests No results for input(s): "TSH", "T4TOTAL", "T3FREE", "THYROIDAB" in the last 72 hours.  Invalid input(s): "FREET3"  Other results:   Imaging    No results found.   Medications:     Scheduled Medications:  apixaban  5 mg Oral BID   carvedilol  6.25 mg Oral BID WC   Chlorhexidine Gluconate Cloth  6 each Topical Daily   dapagliflozin propanediol  10 mg Oral Daily   digoxin  0.125 mg Oral Daily   finasteride  5 mg Oral QPM   furosemide  60 mg Oral Daily   gabapentin  300 mg Oral  BID   iron polysaccharides  150 mg Oral Daily   leptospermum manuka honey  1 Application Topical Daily   OLANZapine zydis  5 mg Oral Once   pravastatin  20 mg Oral q1800   QUEtiapine  50 mg Oral BID   sacubitril-valsartan  1 tablet Oral BID   spironolactone  25 mg Oral Daily   tamsulosin  0.8 mg Oral QPC supper   Vitamin D (Ergocalciferol)  50,000 Units Oral Q7 days    Infusions:   PRN Medications: acetaminophen **OR** acetaminophen, guaiFENesin, hydrALAZINE, ipratropium-albuterol, magnesium hydroxide, ondansetron **OR** ondansetron (ZOFRAN) IV, oxyCODONE-acetaminophen, senna-docusate, traZODone    Assessment/Plan   1. Acute on chronic  systolic CHF: Possible nonischemic cardiomyopathy.  Initial diagnosis made in [place], sounds like he may have had a cardiac cath at that time with no significant CAD.  Most recent prior echo with E 45-50% but was admitted in atrial flutter/RVR with echo showing EF 20-25%, moderate LVH, moderate RV dysfunction, and moderate-severe TR.  He is now s/p TEE-guided DCCV.  Cardiac MRI showed LV EF 26%, normal wall thickness, mid-wall basal septal LGE (nonspecific, seen with dilated cardiomyopathies), RV EF 28%, ECV 33%.  Cardiac MRI was not suggestive of cardiac amyloidosis.  Possible tachycardia-mediated cardiomyopathy (or tachy-mediated worsening of pre-existing CMP).  He is not significantly volume overloaded on exam today.  - Continue Lasix 60 mg daily.  - Continue Entresto to 49/51 bid.  - Continue spironolactone to 25 mg daily.  - Continue Farxiga 10 mg daily.  - Continue Coreg 6.25 mg bid.  - Continue Digoxin 0.125 mcg daily. Level 0.6, repeat today. If trending up will cut back dose.  - Repeat echo in NSR after a couple months.  If not improved, should get LHC/RHC.  2. Atrial flutter: Now in NSR s/p DCCV.  Admitted with AFL with RVR.   - Continue apixaban.  - Should see EP as outpatient for consideration of ablation.  3. Acute delirium: Seems improved.  4. COPD: Active smoker.  Encouraged him to quit.  5. Disposition: Think he is ready for d/c from cardiac perspective, awaiting SNF.   Length of Stay: 12  Alen Bleacher, NP  07/22/2023, 7:40 AM  Advanced Heart Failure Team Pager [phone] (M-F; 7a - 5p)  Please contact [facility] for night-coverage after hours (5p -7a ) and weekends on [email or web address]   Patient seen with NP, agree with the above note   No complaints this morning. Creatinine stable.  BP is ok. He remains in NSR.   General: NAD Neck: No JVD, no thyromegaly or thyroid nodule.  Lungs: Clear to auscultation bilaterally with normal respiratory effort. CV: Nondisplaced PMI.   Heart regular S1/S2, no S3/S4, no murmur.  No peripheral edema.    Abdomen: Soft, nontender, no hepatosplenomegaly, no distention.  Skin: Intact without lesions or rashes.  Neurologic: Alert and oriented x 3.  Psych: Normal affect. Extremities: No clubbing or cyanosis.  HEENT: Normal.   I would continue his current medication regimen, he looks euvolemic.  He remains in NSR after DCCV.  He is awaiting SNF.  Will make cardiology followup.   Marca Ancona 07/22/2023 8:57 AM

## 2023-07-23 ENCOUNTER — Encounter: Payer: Medicare HMO | Admitting: Family

## 2023-07-23 DIAGNOSIS — I5043 Acute on chronic combined systolic (congestive) and diastolic (congestive) heart failure: Secondary | ICD-10-CM | POA: Diagnosis not present

## 2023-07-23 LAB — BASIC METABOLIC PANEL
Anion gap: 6 (ref 5–15)
BUN: 24 mg/dL — ABNORMAL HIGH (ref 8–23)
CO2: 28 mmol/L (ref 22–32)
Calcium: 8.4 mg/dL — ABNORMAL LOW (ref 8.9–10.3)
Chloride: 104 mmol/L (ref 98–111)
Creatinine, Ser: 1.16 mg/dL (ref 0.61–1.24)
GFR, Estimated: >60 mL/min (ref >60)
Glucose, Bld: 98 mg/dL (ref 70–99)
Potassium: 4.3 mmol/L (ref 3.5–5.1)
Sodium: 138 mmol/L (ref 135–145)

## 2023-07-23 LAB — CBC
HCT: 35.4 % — ABNORMAL LOW (ref 39.0–52.0)
Hemoglobin: 11.7 g/dL — ABNORMAL LOW (ref 13.0–17.0)
MCH: 32.3 pg (ref 26.0–34.0)
MCHC: 33.1 g/dL (ref 30.0–36.0)
MCV: 97.8 fL (ref 80.0–100.0)
Platelets: 257 10*3/uL (ref 150–400)
RBC: 3.62 MIL/uL — ABNORMAL LOW (ref 4.22–5.81)
RDW: 14.3 % (ref 11.5–15.5)
WBC: 7.8 10*3/uL (ref 4.0–10.5)
nRBC: 0 % (ref 0.0–0.2)

## 2023-07-23 LAB — MAGNESIUM: Magnesium: 2.2 mg/dL (ref 1.7–2.4)

## 2023-07-23 NOTE — Progress Notes (Signed)
[facility] Progress Note  Patient: Kevin [name]    ION:[id number]  DOA: 07/10/2023     Date of Service: the patient was seen and examined on 07/23/2023  Chief Complaint  Patient presents with   Leg Swelling   Brief hospital course: 67 year old with CHF with reduced EF, COPD, DM 2, HTN comes to the ED with worsening dyspnea on exertion and lower extremity swelling.  Patient was found to be in CHF exacerbation started on IV diuretics.  Repeat echo shows EF 25%.  Occasionally declining medication but family is helpful. GDMT and diuresis per cardiology.    Assessment and Plan:  Principal Problem:   Acute on chronic combined systolic and diastolic CHF (congestive heart failure) (HCC) Active Problems:   Hypokalemia   Essential hypertension   Peripheral neuropathy   Dyslipidemia   # Acute on chronic biventricular systolic CHF (congestive heart failure)  EF 20-25% Bilateral lower extremity swelling with some blistering. Edema resolved Medication noncompliance, previous echo 45% now 25%. 11/16 TTE LVEF 20 to 25%, severely decreased LV function no regional wall motion abnormality, diastolic parameters are indeterminate.  Right ventricular systolic function is moderately reduced. Cardiac MRI showed dilated nonischemic cardiomyopathy, biventricular failure. - GDMT and diuretics per cardiology. Coreg 6.25 mg p.o. twice daily, Farxiga 10 mg p.o. daily, digoxin 1.25 mcg p.o. daily 11/25 Increased Entresto 49-51 mg p.o. twice daily, Aldactone 25 mg p.o. daily,  S/p Lasix 80 mg IV BID till 11/20 am dose. And on 11/22 started Lasix 60 mg pod S/p Empiric Keflex for 5 days for possible LE cellulitis. (EOT 11/19) BNP 1517 elevated  Atrial flutter with RVR s/p cardioversion, converted to sinus rhythm TSH normal, s/p Heparin drip d/c'd on 11/19 Continue Coreg 11/19 started Eliquis 5 mg p.o. twice daily and digoxin 0.125 mg p.o. daily 11/21 s/p TEE/DCCV, converted to sinus rhythm   Urinary  retention, causing elevated creatinine level In-N-Out cath was done twice as per RN on 11/18 and patient refused indwelling Foley catheter.   11/19 Pt was unable to void, had overflow incontinence. agreed for Foley catheter insertion. 11/22 Cr 1.06 continue to monitor D/w urologist Dr. Lonna Cobb, recommended to keep Foley for 1 week and follow-up as an outpatient. Started Flomax 0.4 mg p.o. daily 11/23 last night patient got confused and he pulled out IV line and cut his Foley catheter with scissors. Patient is still retaining urine and refusing Foley catheter insertion.  RN was advised to continue bladder scan and reinsert Foley catheter when patient agrees. 11/25 bladder scan 658 cc, In-N-Out catheter was done and 700 mL urine was collected.  Patient refused indwelling Foley catheter insertion.  Consulted, patient is still thinking about it. 11/25 increased to Flomax 0.8 mg p.o. nightly and started Proscar. 11/26 Foley catheter was inserted last night, finally patient agreed, continue to keep Foley catheter for 7 days and then follow voiding trial  Hypokalemia, potassium repleted.  Resolved. Monitor electrolytes and replete as needed.  Dyslipidemia -Pravastatin   COPD with tobacco use -As needed bronchodilators     Peripheral neuropathy -Gabapentin   Essential hypertension Continue Coreg, Entresto and Aldactone Blood pressure soft, continue to monitor and titrate medication accordingly Cardiology following    Mild macrocytic anemia  Transferrin saturation 16%, slightly low, folate within normal range.  B12 level 1477 elevated Started oral iron supplement  Vitamin D deficiency: started vitamin D 50,000 units p.o. weekly, follow with PCP to repeat vitamin D level after 3 to 6 months.  Declining medications occasionally but  sisters were helpful.  However patient is taking his medications, gradually understanding.   Acute delirium and mild cognitive decline, unable to understand  his medical situation. Patient was started on Seroquel 25 mg p.o. twice daily, on 11/23 increased dose Seroquel 50 mg p.o. twice daily. Haldol 2 mg IV one-time dose given on 11/23   Body mass index is 23.58 kg/m.  Interventions:  Diet: Heart healthy diet DVT Prophylaxis: Therapeutic Anticoagulation with Eliquis    Advance goals of care discussion: Full code  Family Communication: family was not present at bedside, at the time of interview.  The pt provided permission to discuss medical plan with the family. Opportunity was given to ask question and all questions were answered satisfactorily.  11/26 management plan discussed with patient's sister over the phone, all question and concerns answered.  Disposition:  Pt is from home, admitted with CHF, developed atrial flutter,  s/p TEE and cardioversion, s/p IV lasix, and Hep gtt, clinically stable, medically optimized to discharge. Follow TOC for discharge planning, patient needs SNF placement.   Subjective: No significant events overnight, patient slept well overnight, he was resting comfortably, denied any complaints.  No chest pain or palpitation, no shortness of breath. Patient is awaiting for SNF placement.   Physical Exam: General: NAD, lying comfortably Appear in no distress, affect appropriate Eyes: PERRLA ENT: Oral Mucosa Clear, moist  Neck: no JVD,  Cardiovascular: SR, S1-S2 audible, no Murmur,  Respiratory: good respiratory effort, Bilateral Air entry equal and Decreased, no Crackles, no wheezes Abdomen: Bowel Sound present, Soft and no tenderness,  Skin: Full-thickness wound dorsum of bilateral feet, continue dressing as per wound care RN Extremities: mild edema, no calf tenderness.  Edema almost resolved Neurologic: without any new focal findings Gait not checked due to patient safety concerns  Vitals:   07/23/23 0100 07/23/23 0342 07/23/23 0830 07/23/23 1153  BP:  117/76 119/77 120/80  Pulse:  62 63 66  Resp:   18    Temp:  98.4 F (36.9 C) 98.6 F (37 C) 98.2 F (36.8 C)  TempSrc:   Oral Oral  SpO2:  100% 99% 100%  Weight: 70.4 kg     Height:        Intake/Output Summary (Last 24 hours) at 07/23/2023 1354 Last data filed at 07/23/2023 1029 Gross per 24 hour  Intake 240 ml  Output 2000 ml  Net -1760 ml   Filed Weights   07/21/23 0500 07/22/23 0424 07/23/23 0100  Weight: 69 kg 69.3 kg 70.4 kg    Data Reviewed: I have personally reviewed and interpreted daily labs, tele strips, imagings as discussed above. I reviewed all nursing notes, pharmacy notes, vitals, pertinent old records I have discussed plan of care as described above with RN and patient/family.  CBC: Recent Labs  Lab 07/19/23 0434 07/20/23 0510 07/21/23 0420 07/22/23 0433 07/23/23 0451  WBC 8.9 6.6 6.8 7.1 7.8  HGB 11.5* 11.9* 11.4* 11.8* 11.7*  HCT 34.8* 35.8* 34.1* 35.9* 35.4*  MCV 100.0 97.5 97.4 99.2 97.8  PLT 175 200 219 228 257   Basic Metabolic Panel: Recent Labs  Lab 07/17/23 0633 07/17/23 1420 07/18/23 0457 07/19/23 0434 07/20/23 0510 07/21/23 0420 07/22/23 0433 07/23/23 0451  NA 134* 134*   < > 134* 139 137 139 138  K 5.0 4.7   < > 4.4 4.4 3.9 4.0 4.3  CL 98 102   < > 102 103 103 105 104  CO2 26 22   < > 27 29  26 28 28   GLUCOSE 100* 70   < > 112* 106* 124* 100* 98  BUN 36* 34*   < > 30* 30* 28* 24* 24*  CREATININE 1.35* 1.21   < > 1.11 1.11 1.20 1.00 1.16  CALCIUM 8.2* 8.2*   < > 8.4* 8.8* 8.3* 8.1* 8.4*  MG 1.9  --    < > 2.1 2.5* 2.2 2.0 2.2  PHOS 3.9 3.9  --   --   --   --   --   --    < > = values in this interval not displayed.    Studies: No results found.  Scheduled Meds:  apixaban  5 mg Oral BID   carvedilol  6.25 mg Oral BID WC   Chlorhexidine Gluconate Cloth  6 each Topical Daily   dapagliflozin propanediol  10 mg Oral Daily   digoxin  0.125 mg Oral Daily   finasteride  5 mg Oral QPM   furosemide  60 mg Oral Daily   gabapentin  300 mg Oral BID   iron polysaccharides   150 mg Oral Daily   leptospermum manuka honey  1 Application Topical Daily   OLANZapine zydis  5 mg Oral Once   pravastatin  20 mg Oral q1800   QUEtiapine  50 mg Oral BID   sacubitril-valsartan  1 tablet Oral BID   spironolactone  25 mg Oral Daily   tamsulosin  0.8 mg Oral QPC supper   Vitamin D (Ergocalciferol)  50,000 Units Oral Q7 days   Continuous Infusions:   PRN Meds: acetaminophen **OR** acetaminophen, guaiFENesin, hydrALAZINE, ipratropium-albuterol, magnesium hydroxide, ondansetron **OR** ondansetron (ZOFRAN) IV, oxyCODONE-acetaminophen, senna-docusate, traZODone  Time spent: 40 minutes  Author: Gillis Santa. MD [facility] 07/23/2023 1:54 PM  To reach On-call, see care teams to locate the attending and reach out to them via [email or web address]. If 7PM-7AM, please contact night-coverage If you still have difficulty reaching the attending provider, please page the [facility] (Director on Call) for [facility] on amion for assistance.

## 2023-07-23 NOTE — Progress Notes (Signed)
Patient ID: Kevin [name], male   DOB: Mar 21, 1956, 67 y.o.   MRN: [id number]     Advanced Heart Failure Rounding Note  PCP-Cardiologist: Debbe Odea, MD   Subjective:    No complaints this morning.  Remains in NSR.    Objective:   Weight Range: 70.4 kg Body mass index is 23.58 kg/m.   Vital Signs:   Temp:  [97.7 F (36.5 C)-98.6 F (37 C)] 98.6 F (37 C) (11/27 0830) Pulse Rate:  [62-72] 63 (11/27 0830) Resp:  [18] 18 (11/27 0342) BP: (89-119)/(64-77) 119/77 (11/27 0830) SpO2:  [98 %-100 %] 99 % (11/27 0830) Weight:  [70.4 kg] 70.4 kg (11/27 0100) Last BM Date : 07/20/23  Weight change: Filed Weights   07/21/23 0500 07/22/23 0424 07/23/23 0100  Weight: 69 kg 69.3 kg 70.4 kg    Intake/Output:   Intake/Output Summary (Last 24 hours) at 07/23/2023 2130 Last data filed at 07/23/2023 0229 Gross per 24 hour  Intake --  Output 1200 ml  Net -1200 ml      Physical Exam    General: NAD Neck: No JVD, no thyromegaly or thyroid nodule.  Lungs: Clear to auscultation bilaterally with normal respiratory effort. CV: Nondisplaced PMI.  Heart regular S1/S2, no S3/S4, no murmur.  No peripheral edema.    Abdomen: Soft, nontender, no hepatosplenomegaly, no distention.  Skin: Intact without lesions or rashes.  Neurologic: Alert and oriented x 3.  Psych: Normal affect. Extremities: No clubbing or cyanosis.  HEENT: Normal.   Telemetry   NSR 70s (personally reviewed)  Labs    CBC Recent Labs    07/22/23 0433 07/23/23 0451  WBC 7.1 7.8  HGB 11.8* 11.7*  HCT 35.9* 35.4*  MCV 99.2 97.8  PLT 228 257   Basic Metabolic Panel Recent Labs    86/57/84 0433 07/23/23 0451  NA 139 138  K 4.0 4.3  CL 105 104  CO2 28 28  GLUCOSE 100* 98  BUN 24* 24*  CREATININE 1.00 1.16  CALCIUM 8.1* 8.4*  MG 2.0 2.2   Liver Function Tests No results for input(s): "AST", "ALT", "ALKPHOS", "BILITOT", "PROT", "ALBUMIN" in the last 72 hours. No results for input(s): "LIPASE",  "AMYLASE" in the last 72 hours. Cardiac Enzymes No results for input(s): "CKTOTAL", "CKMB", "CKMBINDEX", "TROPONINI" in the last 72 hours.  BNP: BNP (last 3 results) Recent Labs    07/10/23 1700 07/14/23 1008  BNP 2,153.9* 1,517.1*    ProBNP (last 3 results) No results for input(s): "PROBNP" in the last 8760 hours.   D-Dimer No results for input(s): "DDIMER" in the last 72 hours. Hemoglobin A1C No results for input(s): "HGBA1C" in the last 72 hours. Fasting Lipid Panel No results for input(s): "CHOL", "HDL", "LDLCALC", "TRIG", "CHOLHDL", "LDLDIRECT" in the last 72 hours. Thyroid Function Tests No results for input(s): "TSH", "T4TOTAL", "T3FREE", "THYROIDAB" in the last 72 hours.  Invalid input(s): "FREET3"  Other results:   Imaging    No results found.   Medications:     Scheduled Medications:  apixaban  5 mg Oral BID   carvedilol  6.25 mg Oral BID WC   Chlorhexidine Gluconate Cloth  6 each Topical Daily   dapagliflozin propanediol  10 mg Oral Daily   digoxin  0.125 mg Oral Daily   finasteride  5 mg Oral QPM   furosemide  60 mg Oral Daily   gabapentin  300 mg Oral BID   iron polysaccharides  150 mg Oral Daily   leptospermum manuka honey  1 Application Topical Daily   OLANZapine zydis  5 mg Oral Once   pravastatin  20 mg Oral q1800   QUEtiapine  50 mg Oral BID   sacubitril-valsartan  1 tablet Oral BID   spironolactone  25 mg Oral Daily   tamsulosin  0.8 mg Oral QPC supper   Vitamin D (Ergocalciferol)  50,000 Units Oral Q7 days    Infusions:   PRN Medications: acetaminophen **OR** acetaminophen, guaiFENesin, hydrALAZINE, ipratropium-albuterol, magnesium hydroxide, ondansetron **OR** ondansetron (ZOFRAN) IV, oxyCODONE-acetaminophen, senna-docusate, traZODone    Assessment/Plan   1. 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  Initial diagnosis made in [place], sounds like he may have had a cardiac cath at that time with no  significant CAD.  Most recent prior echo with E 45-50% but was admitted in atrial flutter/RVR with echo showing EF 20-25%, moderate LVH, moderate RV dysfunction, and moderate-severe TR.  He is now s/p TEE-guided DCCV.  Cardiac MRI showed LV EF 26%, normal wall thickness, mid-wall basal septal LGE (nonspecific, seen with dilated cardiomyopathies), RV EF 28%, ECV 33%.  Cardiac MRI was not suggestive of cardiac amyloidosis.  Possible tachycardia-mediated cardiomyopathy (or tachy-mediated worsening of pre-existing CMP).  He is not significantly volume overloaded on exam today.  - Continue Lasix 60 mg daily.  - Continue Entresto to 49/51 bid.  - Continue spironolactone to 25 mg daily.  - Continue Farxiga 10 mg daily.  - Continue Coreg 6.25 mg bid.  - Continue Digoxin 0.125 mcg daily, level 0.8 yesterday.  Follow level as outpatient.  - Repeat echo in NSR after a couple months.  If not improved, should get LHC/RHC.  2. Atrial flutter: Now in NSR s/p DCCV.  Admitted with AFL with RVR.   - Continue apixaban.  - Should see EP as outpatient for consideration of ablation.  3. Acute delirium: Seems improved.  4. COPD: Active smoker.  Encouraged him to quit.  5. Disposition: Think he is ready for d/c from cardiac perspective, awaiting SNF.  Would continue current cardiac medication regimen at discharge.  Cardiology to sign off, followup made.   Length of Stay: 12  Marca Ancona, MD  07/23/2023, 9:28 AM  Advanced Heart Failure Team Pager [phone] (M-F; 7a - 5p)  Please contact [facility] for night-coverage after hours (5p -7a ) and weekends on [email or web address]

## 2023-07-23 NOTE — Progress Notes (Signed)
Physical Therapy Treatment Patient Details Name: Kevin [name] MRN: [id number] DOB: 04-Feb-1956 Today's Date: 07/23/2023   History of Present Illness Pt is a 67 y/o admitted 07/10/23 for acute on chronic combined systolic and diastolic CHF and a flutter. PMH includes: HTN, peripheral neuropathy, dyslipidemia, and COPD.    PT Comments  Pt received seated EOB and agreed to PT session. Pt performed STS with the use of RW (2wheels) SUP, and amb ~253ft in hallway with RW SUP-CGA for safety. VC necessary throughout session to assist with the reinforcement of upright posture during amb as pt presented FHRS/trunk flexed. Pt ended session in recliner and has begun to demonstrate consistency thus far with activity tolerance and performance. Pt tolerated Tx well and will continue to benefit from skilled PT sessions to improve strength, activity tolerance and functional mobility to maximize safety/return to PLOF following D/C.    If plan is discharge home, recommend the following: A little help with walking and/or transfers;Direct supervision/assist for medications management;Supervision due to cognitive status;Assistance with cooking/housework;Assistance with feeding   Can travel by private vehicle        Equipment Recommendations  Rolling walker (2 wheels)    Recommendations for Other Services       Precautions / Restrictions Precautions Precautions: Fall Restrictions Weight Bearing Restrictions: No     Mobility  Bed Mobility               General bed mobility comments: Pt received seated EOB. Pt maintained seated balance IND.    Transfers Overall transfer level: Needs assistance Equipment used: Rolling walker (2 wheels) Transfers: Sit to/from Stand Sit to Stand: Supervision           General transfer comment: Pt performed STS with the use of RW (2wheels) SUP and did not report any s/sx relative to dizziness.    Ambulation/Gait Ambulation/Gait assistance: Supervision, Contact  guard assist Gait Distance (Feet): 200 Feet Assistive device: Rolling walker (2 wheels) Gait Pattern/deviations: Step-through pattern Gait velocity: WNL     General Gait Details: Pt amb in hallway with the use of RW (2wheels) SUP-CGA. VC necessary for posture.   Stairs             Wheelchair Mobility     Tilt Bed    Modified Rankin (Stroke Patients Only)       Balance Overall balance assessment: Needs assistance Sitting-balance support: Feet supported Sitting balance-Leahy Scale: Good     Standing balance support: Bilateral upper extremity supported, During functional activity Standing balance-Leahy Scale: Good                              Cognition Arousal: Alert Behavior During Therapy: WFL for tasks assessed/performed Overall Cognitive Status: No family/caregiver present to determine baseline cognitive functioning                                 General Comments: AOx4. Pt pleasant and willing to participate in PT session.        Exercises      General Comments        Pertinent Vitals/Pain Pain Assessment Pain Assessment: No/denies pain    Home Living                          Prior Function  PT Goals (current goals can now be found in the care plan section) Acute Rehab PT Goals Patient Stated Goal: Goal not stated PT Goal Formulation: Patient unable to participate in goal setting Time For Goal Achievement: 08/02/23 Potential to Achieve Goals: Good Progress towards PT goals: Progressing toward goals    Frequency    Min 1X/week      PT Plan      Co-evaluation              AM-PAC PT "6 Clicks" Mobility   Outcome Measure  Help needed turning from your back to your side while in a flat bed without using bedrails?: None Help needed moving from lying on your back to sitting on the side of a flat bed without using bedrails?: None Help needed moving to and from a bed to a chair  (including a wheelchair)?: None Help needed standing up from a chair using your arms (e.g., wheelchair or bedside chair)?: None Help needed to walk in hospital room?: A Lot Help needed climbing 3-5 steps with a railing? : A Little 6 Click Score: 21    End of Session Equipment Utilized During Treatment: Gait belt Activity Tolerance: Patient tolerated treatment well Patient left: in chair;with call bell/phone within reach;with chair alarm set Nurse Communication: Mobility status PT Visit Diagnosis: Other abnormalities of gait and mobility (R26.89);Difficulty in walking, not elsewhere classified (R26.2);Adult, failure to thrive (R62.7);Other (comment)     Time: 1610-9604 PT Time Calculation (min) (ACUTE ONLY): 19 min  Charges:    $Gait Training: 8-22 mins PT General Charges $$ ACUTE PT VISIT: 1 Visit                     Arlie Riker [place] Howard SPT, LAT, ATC  Novalynn Branaman Sauvignon-Howard 07/23/2023, 3:54 PM

## 2023-07-23 NOTE — TOC Progression Note (Signed)
Transition of Care [facility]) - Progression Note    Patient Details  Name: Kevin [name] MRN: [id number] Date of Birth: [birth year]  Transition of Care [facility]) CM/SW Contact  Margarito Liner, LCSW Phone Number: 07/23/2023, 1:19 PM  Clinical Narrative:   Patient is agreeable to SNF placement. Will start search.  Expected Discharge Plan and Services                                   HH Arranged: RN, PT, OT [facility] Agency: [facility] Date [name] Agency Contacted: 07/13/23 Time HH Agency Contacted: [phone] Representative spoke with at [facility] Agency: Confirmed with Kandee Keen   Social Determinants of Health (SDOH) Interventions SDOH Screenings   Food Insecurity: No Food Insecurity (07/11/2023)  Housing: Low Risk  (07/14/2023)  Transportation Needs: No Transportation Needs (07/14/2023)  Recent Concern: Transportation Needs - Unmet Transportation Needs (07/11/2023)  Utilities: Not At Risk (07/11/2023)  Alcohol Screen: Low Risk  (07/14/2023)  Depression (PHQ2-9): Low Risk  (09/18/2021)  Financial Resource Strain: Low Risk  (07/14/2023)  Physical Activity: Insufficiently Active (01/20/2020)  Social Connections: Unknown (01/20/2020)  Stress: No Stress Concern Present (08/04/2019)  Tobacco Use: High Risk (07/11/2023)    Readmission Risk Interventions     No data to display

## 2023-07-23 NOTE — NC FL2 (Signed)
[place] MEDICAID FL2 LEVEL OF CARE FORM     IDENTIFICATION  Patient Name: Kevin [name] Birthdate: [birth year] Sex: male Admission Date (Current Location): 07/10/2023  [facility] and [place] Number:  Chiropodist and Address:  [facility], [address], [place], [place] [address]      Provider Number: [id number]  Attending Physician Name and Address:  Gillis Santa, MD  Relative Name and Phone Number:       Current Level of Care: Hospital Recommended Level of Care: Skilled Nursing Facility Prior Approval Number:    Date Approved/Denied:   PASRR Number: [id number]  Discharge Plan: SNF    Current Diagnoses: Patient Active Problem List   Diagnosis Date Noted   Noncompliance with medication regimen 07/13/2023   Acute on chronic congestive heart failure (HCC) 07/13/2023   Transaminitis 07/12/2023   Acute on chronic combined systolic and diastolic CHF (congestive heart failure) (HCC) 07/11/2023   Essential hypertension 07/11/2023   Peripheral neuropathy 07/11/2023   Dyslipidemia 07/11/2023   Hypokalemia 07/11/2023   COPD (chronic obstructive pulmonary disease) (HCC) 06/12/2020   Chronic diastolic CHF (congestive heart failure) (HCC) 06/12/2020   Hyperlipidemia associated with type 2 diabetes mellitus (HCC) 12/23/2019   Hypertension associated with diabetes (HCC) 12/07/2019   Type 2 diabetes, controlled, with neuropathy (HCC)    Hypotension    Nonsustained ventricular tachycardia (HCC)    Elevated LFTs    Peripheral edema     Orientation RESPIRATION BLADDER Height & Weight     Self, Time, Situation, Place  Normal Incontinent, Indwelling catheter Weight: 155 lb 1.6 oz (70.4 kg) Height:  5\' 8"  (172.7 cm)  BEHAVIORAL SYMPTOMS/MOOD NEUROLOGICAL BOWEL NUTRITION STATUS   (None)   Continent Diet (Heart healthy. Fluid restriction 1800 mL)  AMBULATORY STATUS COMMUNICATION OF NEEDS Skin   Limited Assist Verbally Skin abrasions, Bruising,  Other (Comment) (Weeping. Venous stasis ulcers on both feet: ABD, gauze, honey daily. Burn on left hip: Foam prn.)                       Personal Care Assistance Level of Assistance  Bathing, Feeding, Dressing Bathing Assistance: Maximum assistance Feeding assistance: Limited assistance Dressing Assistance: Maximum assistance     Functional Limitations Info  Sight, Hearing, Speech Sight Info: Adequate Hearing Info: Adequate Speech Info: Adequate    SPECIAL CARE FACTORS FREQUENCY  PT (By licensed PT), OT (By licensed OT)     PT Frequency: 5 x week OT Frequency: 5 x week            Contractures Contractures Info: Not present    Additional Factors Info  Code Status, Allergies Code Status Info: Full code Allergies Info: NKDA           Current Medications (07/23/2023):  This is the current hospital active medication list Current Facility-Administered Medications  Medication Dose Route Frequency Provider Last Rate Last Admin   acetaminophen (TYLENOL) tablet 650 mg  650 mg Oral Q6H PRN Ronnald Ramp, RPH   650 mg at 07/22/23 1743   Or   acetaminophen (TYLENOL) suppository 650 mg  650 mg Rectal Q6H PRN Ronnald Ramp, RPH       apixaban (ELIQUIS) tablet 5 mg  5 mg Oral BID Clegg, Amy D, NP   5 mg at 07/23/23 0939   carvedilol (COREG) tablet 6.25 mg  6.25 mg Oral BID WC Iran Ouch, MD   6.25 mg at 07/23/23 8469   Chlorhexidine Gluconate Cloth  2 % PADS 6 each  6 each Topical Daily Gillis Santa, MD   6 each at 07/23/23 0940   dapagliflozin propanediol (FARXIGA) tablet 10 mg  10 mg Oral Daily Creig Hines, NP   10 mg at 07/23/23 0940   digoxin (LANOXIN) tablet 0.125 mg  0.125 mg Oral Daily Sabharwal, Aditya, DO   0.125 mg at 07/23/23 0940   finasteride (PROSCAR) tablet 5 mg  5 mg Oral QPM Gillis Santa, MD   5 mg at 07/22/23 1739   furosemide (LASIX) tablet 60 mg  60 mg Oral Daily Sabharwal, Aditya, DO   60 mg at 07/23/23 0938   gabapentin  (NEURONTIN) capsule 300 mg  300 mg Oral BID Amin, Ankit C, MD   300 mg at 07/23/23 0939   guaiFENesin (ROBITUSSIN) 100 MG/5ML liquid 5 mL  5 mL Oral Q4H PRN Amin, Ankit C, MD       hydrALAZINE (APRESOLINE) injection 10 mg  10 mg Intravenous Q4H PRN Amin, Ankit C, MD       ipratropium-albuterol (DUONEB) 0.5-2.5 (3) MG/3ML nebulizer solution 3 mL  3 mL Nebulization Q4H PRN Amin, Ankit C, MD       iron polysaccharides (NIFEREX) capsule 150 mg  150 mg Oral Daily Gillis Santa, MD   150 mg at 07/23/23 0940   leptospermum manuka honey (MEDIHONEY) paste 1 Application  1 Application Topical Daily Gillis Santa, MD   1 Application at 07/23/23 0941   magnesium hydroxide (MILK OF MAGNESIA) suspension 30 mL  30 mL Oral Daily PRN Mansy, Jan A, MD   30 mL at 07/23/23 0942   OLANZapine zydis (ZYPREXA) disintegrating tablet 5 mg  5 mg Oral Once Jawo, Modou L, NP       ondansetron (ZOFRAN) tablet 4 mg  4 mg Oral Q6H PRN Mansy, Jan A, MD       Or   ondansetron [facility]) injection 4 mg  4 mg Intravenous Q6H PRN Mansy, Jan A, MD       oxyCODONE-acetaminophen (PERCOCET/ROXICET) 5-325 MG per tablet 1 tablet  1 tablet Oral Q4H PRN Mansy, Jan A, MD   1 tablet at 07/18/23 2230   pravastatin (PRAVACHOL) tablet 20 mg  20 mg Oral q1800 Mansy, Jan A, MD   20 mg at 07/22/23 1739   QUEtiapine (SEROQUEL) tablet 50 mg  50 mg Oral BID Gillis Santa, MD   50 mg at 07/23/23 9147   sacubitril-valsartan (ENTRESTO) 49-51 mg per tablet  1 tablet Oral BID Laurey Morale, MD   1 tablet at 07/23/23 8295   senna-docusate (Senokot-S) tablet 1 tablet  1 tablet Oral QHS PRN Amin, Ankit C, MD   1 tablet at 07/18/23 2226   spironolactone (ALDACTONE) tablet 25 mg  25 mg Oral Daily Laurey Morale, MD   25 mg at 07/23/23 6213   tamsulosin (FLOMAX) capsule 0.8 mg  0.8 mg Oral QPC supper Gillis Santa, MD   0.8 mg at 07/22/23 1739   traZODone (DESYREL) tablet 25 mg  25 mg Oral QHS PRN Mansy, Jan A, MD   25 mg at 07/21/23 2122   Vitamin D  (Ergocalciferol) (DRISDOL) 1.25 MG (50000 UNIT) capsule 50,000 Units  50,000 Units Oral Q7 days Gillis Santa, MD   50,000 Units at 07/22/23 1002     Discharge Medications: Please see discharge summary for a list of discharge medications.  Relevant Imaging Results:  Relevant Lab Results:   Additional Information SS#: [id number]  Margarito Liner, LCSW

## 2023-07-24 DIAGNOSIS — I5043 Acute on chronic combined systolic (congestive) and diastolic (congestive) heart failure: Secondary | ICD-10-CM | POA: Diagnosis not present

## 2023-07-24 LAB — MAGNESIUM: Magnesium: 2.4 mg/dL (ref 1.7–2.4)

## 2023-07-24 LAB — BASIC METABOLIC PANEL
Anion gap: 8 (ref 5–15)
BUN: 25 mg/dL — ABNORMAL HIGH (ref 8–23)
CO2: 28 mmol/L (ref 22–32)
Calcium: 8.4 mg/dL — ABNORMAL LOW (ref 8.9–10.3)
Chloride: 103 mmol/L (ref 98–111)
Creatinine, Ser: 1.11 mg/dL (ref 0.61–1.24)
GFR, Estimated: >60 mL/min (ref >60)
Glucose, Bld: 145 mg/dL — ABNORMAL HIGH (ref 70–99)
Potassium: 4.5 mmol/L (ref 3.5–5.1)
Sodium: 139 mmol/L (ref 135–145)

## 2023-07-24 LAB — CBC
HCT: 35.7 % — ABNORMAL LOW (ref 39.0–52.0)
Hemoglobin: 11.9 g/dL — ABNORMAL LOW (ref 13.0–17.0)
MCH: 32.6 pg (ref 26.0–34.0)
MCHC: 33.3 g/dL (ref 30.0–36.0)
MCV: 97.8 fL (ref 80.0–100.0)
Platelets: 277 10*3/uL (ref 150–400)
RBC: 3.65 MIL/uL — ABNORMAL LOW (ref 4.22–5.81)
RDW: 14.5 % (ref 11.5–15.5)
WBC: 7.6 10*3/uL (ref 4.0–10.5)
nRBC: 0 % (ref 0.0–0.2)

## 2023-07-24 MED ORDER — TAMSULOSIN HCL 0.4 MG PO CAPS
0.4000 mg | ORAL_CAPSULE | Freq: Every day | ORAL | Status: DC
  Administered 2023-07-24 – 2023-07-30 (×7): 0.4 mg via ORAL
  Filled 2023-07-24 (×7): qty 1

## 2023-07-24 NOTE — Progress Notes (Signed)
[facility] Progress Note  Patient: Kevin [name]    IHK:[id number]  DOA: 07/10/2023     Date of Service: the patient was seen and examined on 07/24/2023  Chief Complaint  Patient presents with   Leg Swelling   Brief hospital course: 67 year old with CHF with reduced EF, COPD, DM 2, HTN comes to the ED with worsening dyspnea on exertion and lower extremity swelling.  Patient was found to be in CHF exacerbation started on IV diuretics.  Repeat echo shows EF 25%.  Occasionally declining medication but family is helpful. GDMT and diuresis per cardiology.    Assessment and Plan:  Principal Problem:   Acute on chronic combined systolic and diastolic CHF (congestive heart failure) (HCC) Active Problems:   Hypokalemia   Essential hypertension   Peripheral neuropathy   Dyslipidemia   # Acute on chronic biventricular systolic CHF (congestive heart failure)  EF 20-25% Bilateral lower extremity swelling with some blistering. Edema resolved Medication noncompliance, previous echo 45% now 25%. 11/16 TTE LVEF 20 to 25%, severely decreased LV function no regional wall motion abnormality, diastolic parameters are indeterminate.  Right ventricular systolic function is moderately reduced. Cardiac MRI showed dilated nonischemic cardiomyopathy, biventricular failure. - GDMT and diuretics per cardiology. Coreg 6.25 mg p.o. twice daily, Farxiga 10 mg p.o. daily, digoxin 1.25 mcg p.o. daily 11/25 Increased Entresto 49-51 mg p.o. twice daily, Aldactone 25 mg p.o. daily,  S/p Lasix 80 mg IV BID till 11/20 am dose. And on 11/22 started Lasix 60 mg pod S/p Empiric Keflex for 5 days for possible LE cellulitis. (EOT 11/19) BNP 1517 elevated  # Atrial flutter with RVR s/p cardioversion, converted to sinus rhythm TSH normal, s/p Heparin drip d/c'd on 11/19 Continue Coreg 11/19 started Eliquis 5 mg p.o. twice daily and digoxin 0.125 mg p.o. daily 11/21 s/p TEE/DCCV, converted to sinus rhythm   # Urinary  retention, causing elevated creatinine level In-N-Out cath was done twice as per RN on 11/18 and patient refused indwelling Foley catheter.   11/19 Pt was unable to void, had overflow incontinence. agreed for Foley catheter insertion. 11/22 Cr 1.06 continue to monitor D/w urologist Dr. Lonna Cobb, recommended to keep Foley for 1 week and follow-up as an outpatient. Started Flomax 0.4 mg p.o. daily 11/23 last night patient got confused and he pulled out IV line and cut his Foley catheter with scissors. Patient is still retaining urine and refusing Foley catheter insertion.  RN was advised to continue bladder scan and reinsert Foley catheter when patient agrees. 11/25 bladder scan 658 cc, In-N-Out catheter was done and 700 mL urine was collected.  Patient refused indwelling Foley catheter insertion.  Consulted, patient is still thinking about it. 11/25 increased to Flomax 0.8 mg p.o. nightly and started Proscar. 11/26 Foley catheter was inserted last night, finally patient agreed, continue to keep Foley catheter for 7 days and then follow voiding trial  Hypokalemia, potassium repleted.  Resolved. Monitor electrolytes and replete as needed.  Dyslipidemia -Pravastatin   COPD with tobacco use -As needed bronchodilators     Peripheral neuropathy -Gabapentin   Essential hypertension Continue Coreg, Entresto and Aldactone Blood pressure soft, continue to monitor and titrate medication accordingly Cardiology following    Mild macrocytic anemia  Transferrin saturation 16%, slightly low, folate within normal range.  B12 level 1477 elevated Started oral iron supplement  Vitamin D deficiency: started vitamin D 50,000 units p.o. weekly, follow with PCP to repeat vitamin D level after 3 to 6 months.  Declining medications  occasionally but sisters were helpful.  However patient is taking his medications, gradually understanding.   Acute delirium and mild cognitive decline, unable to understand  his medical situation. Patient was started on Seroquel 25 mg p.o. twice daily, on 11/23 increased dose of Seroquel 50 mg p.o. twice daily. Haldol 2 mg IV one-time dose given on 11/23   Body mass index is 22.93 kg/m.  Interventions:  Diet: Heart healthy diet DVT Prophylaxis: Therapeutic Anticoagulation with Eliquis    Advance goals of care discussion: Full code  Family Communication: family was not present at bedside, at the time of interview.  The pt provided permission to discuss medical plan with the family. Opportunity was given to ask question and all questions were answered satisfactorily.  11/26 management plan discussed with patient's sister over the phone, all question and concerns answered.  Disposition:  Pt is from home, admitted with CHF, developed atrial flutter,  s/p TEE and cardioversion, s/p IV lasix, and Hep gtt, clinically stable, medically optimized to discharge. Follow TOC for discharge planning, patient needs SNF placement.   Subjective: No significant events overnight, laying comfortably, denied any complaints  Physical Exam: General: NAD, lying comfortably Appear in no distress, affect appropriate Eyes: PERRLA ENT: Oral Mucosa Clear, moist  Neck: no JVD,  Cardiovascular: SR, S1-S2 audible, no Murmur,  Respiratory: good respiratory effort, Bilateral Air entry equal and Decreased, no Crackles, no wheezes Abdomen: Bowel Sound present, Soft and no tenderness,  Skin: Full-thickness wound dorsum of bilateral feet, continue dressing as per wound care RN Extremities: edema resolved, no calf tenderness.  Neurologic: without any new focal findings Gait not checked due to patient safety concerns  Vitals:   07/24/23 0551 07/24/23 0816 07/24/23 1055 07/24/23 1138  BP: (!) 145/72 113/70 110/62 120/79  Pulse: 68 (!) 59 63 66  Resp: 18 18  18   Temp: 98.6 F (37 C) (!) 97.5 F (36.4 C)  98.4 F (36.9 C)  TempSrc:  Oral  Oral  SpO2: 97% 100%  99%  Weight: 68.4 kg      Height:        Intake/Output Summary (Last 24 hours) at 07/24/2023 1423 Last data filed at 07/24/2023 1220 Gross per 24 hour  Intake 660 ml  Output 1100 ml  Net -440 ml   Filed Weights   07/22/23 0424 07/23/23 0100 07/24/23 0551  Weight: 69.3 kg 70.4 kg 68.4 kg    Data Reviewed: I have personally reviewed and interpreted daily labs, tele strips, imagings as discussed above. I reviewed all nursing notes, pharmacy notes, vitals, pertinent old records I have discussed plan of care as described above with RN and patient/family.  CBC: Recent Labs  Lab 07/20/23 0510 07/21/23 0420 07/22/23 0433 07/23/23 0451 07/24/23 0520  WBC 6.6 6.8 7.1 7.8 7.6  HGB 11.9* 11.4* 11.8* 11.7* 11.9*  HCT 35.8* 34.1* 35.9* 35.4* 35.7*  MCV 97.5 97.4 99.2 97.8 97.8  PLT 200 219 228 257 277   Basic Metabolic Panel: Recent Labs  Lab 07/20/23 0510 07/21/23 0420 07/22/23 0433 07/23/23 0451 07/24/23 0520  NA 139 137 139 138 139  K 4.4 3.9 4.0 4.3 4.5  CL 103 103 105 104 103  CO2 29 26 28 28 28   GLUCOSE 106* 124* 100* 98 145*  BUN 30* 28* 24* 24* 25*  CREATININE 1.11 1.20 1.00 1.16 1.11  CALCIUM 8.8* 8.3* 8.1* 8.4* 8.4*  MG 2.5* 2.2 2.0 2.2 2.4    Studies: No results found.  Scheduled Meds:  apixaban  5 mg Oral BID   carvedilol  6.25 mg Oral BID WC   Chlorhexidine Gluconate Cloth  6 each Topical Daily   dapagliflozin propanediol  10 mg Oral Daily   digoxin  0.125 mg Oral Daily   finasteride  5 mg Oral QPM   furosemide  60 mg Oral Daily   gabapentin  300 mg Oral BID   iron polysaccharides  150 mg Oral Daily   leptospermum manuka honey  1 Application Topical Daily   OLANZapine zydis  5 mg Oral Once   pravastatin  20 mg Oral q1800   QUEtiapine  50 mg Oral BID   sacubitril-valsartan  1 tablet Oral BID   spironolactone  25 mg Oral Daily   tamsulosin  0.4 mg Oral QPC supper   Vitamin D (Ergocalciferol)  50,000 Units Oral Q7 days   Continuous Infusions:   PRN Meds:  acetaminophen **OR** acetaminophen, guaiFENesin, hydrALAZINE, ipratropium-albuterol, magnesium hydroxide, ondansetron **OR** ondansetron (ZOFRAN) IV, oxyCODONE-acetaminophen, senna-docusate, traZODone  Time spent: 40 minutes  Author: Gillis Santa. MD [facility] 07/24/2023 2:23 PM  To reach On-call, see care teams to locate the attending and reach out to them via [email or web address]. If 7PM-7AM, please contact night-coverage If you still have difficulty reaching the attending provider, please page the [facility] (Director on Call) for [facility] on amion for assistance.

## 2023-07-24 NOTE — Plan of Care (Signed)

## 2023-07-24 NOTE — Plan of Care (Signed)

## 2023-07-25 DIAGNOSIS — I5043 Acute on chronic combined systolic (congestive) and diastolic (congestive) heart failure: Secondary | ICD-10-CM

## 2023-07-25 DIAGNOSIS — I509 Heart failure, unspecified: Secondary | ICD-10-CM

## 2023-07-25 MED ORDER — SILVER SULFADIAZINE 1 % EX CREA
TOPICAL_CREAM | Freq: Every day | CUTANEOUS | Status: DC
  Filled 2023-07-25: qty 85

## 2023-07-25 NOTE — Consult Note (Signed)
[facility] Liaison Note  07/25/2023  Kevin [name] 1956-04-19 [id number]  Location: RN Hospital Liaison screened the patient remotely at [facility].  Insurance: [facility] HMO   Kevin [name] is a 67 y.o. male who is a Primary Care Patient of Pcp, No The patient was screened for readmission hospitalization with noted high risk score for unplanned readmission risk with 1 IP/1 ED in 6 months.  The patient was assessed for potential Care Management service needs for post hospital transition for care coordination. Review of patient's electronic medical record reveals patient was admitted for Acute on Chronic CHF. Recommendations for SNF for ongoing rehab. Post hospital discharge to the SNF level of care the facility will continue to address pt's ongoing needs.  Plan: [facility] Liaison will continue to follow progress and disposition to asess for post hospital community care coordination/management needs.  Referral request for community care coordination: pending disposition.   [facility] Care Management/Population Health does not replace or interfere with any arrangements made by the Inpatient Transition of Care team.   For questions contact:   Elliot Cousin, RN, [facility] Liaison [place]   [facility], Population Health Office Hours MTWF  8:00 am-6:00 pm Direct Dial: [phone] mobile [phone] [Office toll free line] Office Hours are M-F 8:30 - 5 pm [name]

## 2023-07-25 NOTE — TOC Progression Note (Signed)
Transition of Care [facility]) - Progression Note    Patient Details  Name: Kevin [name] MRN: [id number] Date of Birth: Jan 09, 1956  Transition of Care [facility]) CM/SW Contact  Truddie Hidden, RN Phone Number: 07/25/2023, 3:59 PM  Clinical Narrative:    Spoke with patient at the bedside with his sister on the phone. They were provided with bed offers for [facility], [facility] and [facility]. They have chosen [facility].        Expected Discharge Plan and Services                                   HH Arranged: RN, PT, OT [facility] Agency: [facility] Date [facility] Agency Contacted: 07/13/23 Time HH Agency Contacted: [phone] Representative spoke with at [facility] Agency: Confirmed with Kandee Keen   Social Determinants of Health (SDOH) Interventions SDOH Screenings   Food Insecurity: No Food Insecurity (07/11/2023)  Housing: Low Risk  (07/14/2023)  Transportation Needs: No Transportation Needs (07/14/2023)  Recent Concern: Transportation Needs - Unmet Transportation Needs (07/11/2023)  Utilities: Not At Risk (07/11/2023)  Alcohol Screen: Low Risk  (07/14/2023)  Depression (PHQ2-9): Low Risk  (09/18/2021)  Financial Resource Strain: Low Risk  (07/14/2023)  Physical Activity: Insufficiently Active (01/20/2020)  Social Connections: Unknown (01/20/2020)  Stress: No Stress Concern Present (08/04/2019)  Tobacco Use: High Risk (07/11/2023)    Readmission Risk Interventions     No data to display

## 2023-07-25 NOTE — Plan of Care (Signed)
  Problem: Health Behavior/Discharge Planning: Goal: Ability to manage health-related needs will improve Outcome: Progressing   

## 2023-07-25 NOTE — Plan of Care (Signed)

## 2023-07-25 NOTE — Progress Notes (Signed)
PROGRESS NOTE    Kevin [name]  YTK:[id number] DOB: July 25, 1956 DOA: 07/10/2023 PCP: Pcp, No   Assessment & Plan:   Principal Problem:   Acute on chronic combined systolic and diastolic CHF (congestive heart failure) (HCC) Active Problems:   Hypokalemia   Peripheral edema   Essential hypertension   Peripheral neuropathy   Dyslipidemia   Transaminitis   Noncompliance with medication regimen   Acute on chronic congestive heart failure (HCC)  Assessment and Plan:  Acute on chronic biventricular systolic CHF: EF 55-73%. Continue on coreg, entresto, aldactone, lasix, farxiga as per cardio   Atrial flutter: w/ RVR s/p cardioversion & now NSR. Continue on coreg, digoxin, eliquis    Urinary retention: continue w/ foley x 7 days. Will  have to follow w/ uro, Dr. Lonna Cobb, outpatient for voiding trial. Continue on flomax, proscar    Hypokalemia: WNL    HLD: continue on statin    COPD: w/o exacerbation. Bronchodilators prn    Peripheral neuropathy: continue on gabapentin   HTN: continue on coreg, entresto, aldactone    Macrocytic anemia: b12, folate are WNL. No need for a transfusion currently. Will continue to monitor    Vitamin D deficiency: continue on vit D supplement    Mild cognitive impairment: continue on seroquel      DVT prophylaxis: eliquis  Code Status: full  Family Communication: Disposition Plan: waiting on SNF placement   Level of care: Telemetry Cardiac  Status is: Inpatient Remains inpatient appropriate because: medically stable. Waiting on SNF placement     Consultants:  Cardio   Procedures:   Antimicrobials:    Subjective: Pt c/o fatigue   Objective: Vitals:   07/24/23 1627 07/24/23 2117 07/25/23 0538 07/25/23 0728  BP: 123/74 113/72  108/69  Pulse: 65 68  64  Resp: 16 (!) 22  15  Temp: 98.3 F (36.8 C) 98.7 F (37.1 C)  98.4 F (36.9 C)  TempSrc:  Oral    SpO2: 100% 100%  98%  Weight:   68.1 kg   Height:        Intake/Output  Summary (Last 24 hours) at 07/25/2023 0853 Last data filed at 07/25/2023 0400 Gross per 24 hour  Intake 1260 ml  Output 2200 ml  Net -940 ml   Filed Weights   07/23/23 0100 07/24/23 0551 07/25/23 0538  Weight: 70.4 kg 68.4 kg 68.1 kg    Examination:  General exam: Appears calm and comfortable  Respiratory system: Clear to auscultation. Respiratory effort normal. Cardiovascular system: S1 & S2 +. No rubs, gallops or clicks Gastrointestinal system: Abdomen is nondistended, soft and nontender.Normal bowel sounds heard. Central nervous system: Alert and oriented. Moves all extremities  Psychiatry: Judgement and insight appears at baseline. Mood & affect appropriate.     Data Reviewed: I have personally reviewed following labs and imaging studies  CBC: Recent Labs  Lab 07/20/23 0510 07/21/23 0420 07/22/23 0433 07/23/23 0451 07/24/23 0520  WBC 6.6 6.8 7.1 7.8 7.6  HGB 11.9* 11.4* 11.8* 11.7* 11.9*  HCT 35.8* 34.1* 35.9* 35.4* 35.7*  MCV 97.5 97.4 99.2 97.8 97.8  PLT 200 219 228 257 277   Basic Metabolic Panel: Recent Labs  Lab 07/20/23 0510 07/21/23 0420 07/22/23 0433 07/23/23 0451 07/24/23 0520  NA 139 137 139 138 139  K 4.4 3.9 4.0 4.3 4.5  CL 103 103 105 104 103  CO2 29 26 28 28 28   GLUCOSE 106* 124* 100* 98 145*  BUN 30* 28* 24* 24* 25*  CREATININE  1.11 1.20 1.00 1.16 1.11  CALCIUM 8.8* 8.3* 8.1* 8.4* 8.4*  MG 2.5* 2.2 2.0 2.2 2.4   GFR: Estimated Creatinine Clearance: 63.1 mL/min (by C-G formula based on SCr of 1.11 mg/dL). Liver Function Tests: No results for input(s): "AST", "ALT", "ALKPHOS", "BILITOT", "PROT", "ALBUMIN" in the last 168 hours. No results for input(s): "LIPASE", "AMYLASE" in the last 168 hours. No results for input(s): "AMMONIA" in the last 168 hours. Coagulation Profile: No results for input(s): "INR", "PROTIME" in the last 168 hours. Cardiac Enzymes: No results for input(s): "CKTOTAL", "CKMB", "CKMBINDEX", "TROPONINI" in the last  168 hours. BNP (last 3 results) No results for input(s): "PROBNP" in the last 8760 hours. HbA1C: No results for input(s): "HGBA1C" in the last 72 hours. CBG: No results for input(s): "GLUCAP" in the last 168 hours. Lipid Profile: No results for input(s): "CHOL", "HDL", "LDLCALC", "TRIG", "CHOLHDL", "LDLDIRECT" in the last 72 hours. Thyroid Function Tests: No results for input(s): "TSH", "T4TOTAL", "FREET4", "T3FREE", "THYROIDAB" in the last 72 hours. Anemia Panel: No results for input(s): "VITAMINB12", "FOLATE", "FERRITIN", "TIBC", "IRON", "RETICCTPCT" in the last 72 hours. Sepsis Labs: No results for input(s): "PROCALCITON", "LATICACIDVEN" in the last 168 hours.  No results found for this or any previous visit (from the past 240 hour(s)).       Radiology Studies: No results found.      Scheduled Meds:  apixaban  5 mg Oral BID   carvedilol  6.25 mg Oral BID WC   Chlorhexidine Gluconate Cloth  6 each Topical Daily   dapagliflozin propanediol  10 mg Oral Daily   digoxin  0.125 mg Oral Daily   finasteride  5 mg Oral QPM   furosemide  60 mg Oral Daily   gabapentin  300 mg Oral BID   iron polysaccharides  150 mg Oral Daily   leptospermum manuka honey  1 Application Topical Daily   OLANZapine zydis  5 mg Oral Once   pravastatin  20 mg Oral q1800   QUEtiapine  50 mg Oral BID   sacubitril-valsartan  1 tablet Oral BID   spironolactone  25 mg Oral Daily   tamsulosin  0.4 mg Oral QPC supper   Vitamin D (Ergocalciferol)  50,000 Units Oral Q7 days   Continuous Infusions:   LOS: 15 days      Charise Killian, MD [facility] Pager [phone]  If 7PM-7AM, please contact night-coverage [email or web address] 07/25/2023, 8:53 AM

## 2023-07-25 NOTE — Progress Notes (Addendum)
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 PMH includes: HTN, peripheral neuropathy, dyslipidemia, and COPD.   OT comments  Pt engaging in ADL session from seated / standing position. Requires multimodal cuing to correct forward flexed posture and impulsive behaviors with poor safety awareness during session. Overall, UB bathing setup with cues for sequencing, minA for LB bathing and CGA for transfers/mobility. Case manager present during OT session. Discharge recommendation remains appropriate, making good progress towards goals and goals updated. OT will continue to follow for functional gains.       If plan is discharge home, recommend the following:  A little help with walking and/or transfers;Help with stairs or ramp for entrance;Assist for transportation;Assistance with cooking/housework;A lot of help with bathing/dressing/bathroom;Direct supervision/assist for financial management;Supervision due to cognitive status;Direct supervision/assist for medications management   Equipment Recommendations  Other (comment);Tub/shower bench    Recommendations for Other Services      Precautions / Restrictions Precautions Precautions: Fall Restrictions Weight Bearing Restrictions: No       Mobility Bed Mobility Overal bed mobility: Needs Assistance Bed Mobility: Supine to Sit     Supine to sit: Supervision, HOB elevated Sit to supine: Supervision, HOB elevated, Used rails        Transfers Overall transfer level: Needs assistance Equipment used: Rolling walker (2 wheels) Transfers: Sit to/from Stand Sit to Stand: Supervision                 Balance Overall balance assessment: Mild deficits observed, not formally tested Sitting-balance support: Feet  supported Sitting balance-Leahy Scale: Good Sitting balance - Comments: no LOB while seated at EOB Postural control: Posterior lean Standing balance support: Bilateral upper extremity supported, During functional activity Standing balance-Leahy Scale: Fair Standing balance comment: minA to correct posterior LOB while standing (pt attempts to turn in a circle while holding RW in air)                           ADL either performed or assessed with clinical judgement   ADL Overall ADL's : Needs assistance/impaired     Grooming: Wash/dry hands;Wash/dry face;Sitting Grooming Details (indicate cue type and reason): seated EOB Upper Body Bathing: Sitting;Minimal assistance;Cueing for safety Upper Body Bathing Details (indicate cue type and reason): cuing for sequencing Lower Body Bathing: Contact guard assist;Sit to/from stand;Sitting/lateral leans;Cueing for sequencing;Cueing for safety Lower Body Bathing Details (indicate cue type and reason): Pt demos poor safety awareness and impulsive behaviors, does not wait for OT to setup environment as OT requests, instead stands spontaonously from EOB, and attempts to ball gown up under both feet, turning in a circle holding RW up in the air. OT provides gentle redirection for safety, and pt redirects with mod multimodal cuing and increased time. LB bathing completed from safe position with CGA, cues for throughalness Upper Body Dressing : Minimal assistance;Sitting                   Functional mobility during ADLs: Contact guard assist;Rolling walker (2 wheels) General ADL Comments: Forward flexed posture throughout, pt with poor safety awareness, requires cues for upright trunk, and 1 LOB while attempting to turn in a circle holding RW in air, minA to correct      Cognition Arousal: Alert Behavior During  Therapy: WFL for tasks assessed/performed Overall Cognitive Status: No family/caregiver present to determine baseline cognitive  functioning Area of Impairment: Safety/judgement, Orientation, Attention, Following commands, Awareness, Problem solving                 Orientation Level: Disoriented to, Time, Situation Current Attention Level: Focused Memory: Decreased short-term memory Following Commands: Follows one step commands consistently, Follows one step commands with increased time Safety/Judgement: Decreased awareness of safety, Decreased awareness of deficits Awareness: Intellectual Problem Solving: Slow processing, Requires verbal cues                General Comments Bandages on both feet    Pertinent Vitals/ Pain       Pain Assessment Pain Assessment: 0-10 Pain Score: 0-No pain         Frequency  Min 1X/week        Progress Toward Goals  OT Goals(current goals can now be found in the care plan section)  Progress towards OT goals: Progressing toward goals  Acute Rehab OT Goals OT Goal Formulation: With patient Time For Goal Achievement: 07/25/23 Potential to Achieve Goals: Good  Plan         AM-PAC OT "6 Clicks" Daily Activity     Outcome Measure   Help from another person eating meals?: None Help from another person taking care of personal grooming?: None Help from another person toileting, which includes using toliet, bedpan, or urinal?: A Little Help from another person bathing (including washing, rinsing, drying)?: A Little Help from another person to put on and taking off regular upper body clothing?: None Help from another person to put on and taking off regular lower body clothing?: A Lot 6 Click Score: 20    End of Session Equipment Utilized During Treatment: Rolling walker (2 wheels)  OT Visit Diagnosis: Other abnormalities of gait and mobility (R26.89);Unsteadiness on feet (R26.81)   Activity Tolerance Patient tolerated treatment well   Patient Left in bed;with bed alarm set;with call bell/phone within reach   Nurse Communication Mobility status         Time: 1420-1445 OT Time Calculation (min): 25 min  Charges: OT General Charges $OT Visit: 1 Visit OT Evaluation $OT Re-eval: 1 Re-eval OT Treatments $Self Care/Home Management : 8-22 mins  Kristalyn Bergstresser L. Krystl Wickware, OTR/L  07/25/23, 2:55 PM

## 2023-07-25 NOTE — Consult Note (Signed)
WOC Nurse Consult Note: per patient he spilled coffee on his left hip  Reason for Consult: L hip blister that has opened  Wound type:  partial thickness skin lost post burn from coffee  Pressure Injury POA: NA  Measurement:Patient with 3 separate open areas; former blisters that opened superior L hip 4 cm x 3 cm, distal L hip 4 cm x 5 cm and medial towards left buttock 1 cm x 2 cm all partial thickness skin loss pink and moist  Wound bed: pink and moist  Drainage (amount, consistency, odor) minimal serous  Periwound: area of dark tissue between superior and distal wound beds, possibly blister that has drained with skin intact  Dressing procedure/placement/frequency: Clean L hip wound with NS, apply Silvadene cream to wounds daily, cover with Telfa non-stick pad and silicone foam or ABD pad whichever preferred.  Make sure to clean off all old Silvadene before applying new.    Discussed POC with patient and bedside nurse. WOC team will not follow. Re-consult if further needs arise.   Thank you,    Priscella Mann MSN, RN-BC, [employer] [phone]

## 2023-07-25 NOTE — Progress Notes (Signed)
Physical Therapy Treatment Patient Details Name: Kevin [name] MRN: [id number] DOB: 03-May-1956 Today's Date: 07/25/2023   History of Present Illness Pt is a 67 y/o admitted 07/10/23 for acute on chronic combined systolic and diastolic CHF and a flutter. PMH includes: HTN, peripheral neuropathy, dyslipidemia, and COPD.    PT Comments  Pt was long sitting in bed upon arrival.  He is cooperative and pleasant but does lack insight of deficits and overall safety awareness. Pt was motivated. Safely demonstrated abilities to exit bed and stand to RW. Poor standing posture throughout. Vcs for posture correction and safety. Overall pt tolerated session well but is at high risk of falls. Acute PT will continue to follow and progress per current POC.    If plan is discharge home, recommend the following: A little help with walking and/or transfers;Direct supervision/assist for medications management;Supervision due to cognitive status;Assistance with cooking/housework;Assistance with feeding     Equipment Recommendations  None recommended by PT (pt endorses using RW at baseline)       Precautions / Restrictions Precautions Precautions: Fall Restrictions Weight Bearing Restrictions: No     Mobility  Bed Mobility Overal bed mobility: Modified Independent Bed Mobility: Supine to Sit  Supine to sit: Supervision   Transfers Overall transfer level: Modified independent Equipment used: Rolling walker (2 wheels) Transfers: Sit to/from Stand Sit to Stand: Supervision    Ambulation/Gait Ambulation/Gait assistance: Contact guard assist Gait Distance (Feet): 300 Feet Assistive device: Rolling walker (2 wheels) Gait Pattern/deviations: Step-through pattern, Trunk flexed, Staggering right, Staggering left Gait velocity: WNL  General Gait Details: Ptr ambulates with poor posture however tolerated ~ 300 ft without c/o fatigue. Vitals were astable throughout ambulation.    Balance Overall balance  assessment: Mild deficits observed, not formally tested Sitting-balance support: Feet supported Sitting balance-Leahy Scale: Good Sitting balance - Comments: no LOB while seated at EOB   Standing balance support: Bilateral upper extremity supported, During functional activity Standing balance-Leahy Scale: Fair Standing balance comment: did have staggering at times but no intervention required       Cognition Arousal: Alert Behavior During Therapy: WFL for tasks assessed/performed Overall Cognitive Status: No family/caregiver present to determine baseline cognitive functioning Area of Impairment: Safety/judgement, Orientation, Attention, Following commands, Awareness, Problem solving    Orientation Level: Disoriented to, Time    Problem Solving: Slow processing General Comments: Pt was pleasant and cooperative but does have some cognition deficits observed during session           General Comments General comments (skin integrity, edema, etc.): Bandages on both feet      Pertinent Vitals/Pain Pain Assessment Pain Assessment: No/denies pain Pain Score: 0-No pain     PT Goals (current goals can now be found in the care plan section) Acute Rehab PT Goals Patient Stated Goal: return to moms house first, then hopefully return to [place] Progress towards PT goals: Progressing toward goals    Frequency    Min 1X/week       AM-PAC PT "6 Clicks" Mobility   Outcome Measure  Help needed turning from your back to your side while in a flat bed without using bedrails?: None Help needed moving from lying on your back to sitting on the side of a flat bed without using bedrails?: None Help needed moving to and from a bed to a chair (including a wheelchair)?: None Help needed standing up from a chair using your arms (e.g., wheelchair or bedside chair)?: None Help needed to walk in hospital room?:  A Little Help needed climbing 3-5 steps with a railing? : A Little 6 Click  Score: 22    End of Session   Activity Tolerance: Patient tolerated treatment well Patient left: in chair;with call bell/phone within reach;with chair alarm set Nurse Communication: Mobility status PT Visit Diagnosis: Other abnormalities of gait and mobility (R26.89);Difficulty in walking, not elsewhere classified (R26.2);Adult, failure to thrive (R62.7);Other (comment)     Time: 1610-9604 PT Time Calculation (min) (ACUTE ONLY): 10 min  Charges:    $Gait Training: 8-22 mins PT General Charges $$ ACUTE PT VISIT: 1 Visit                    Jetta Lout PTA 07/25/23, 4:36 PM

## 2023-07-26 DIAGNOSIS — I5043 Acute on chronic combined systolic (congestive) and diastolic (congestive) heart failure: Secondary | ICD-10-CM | POA: Diagnosis not present

## 2023-07-26 LAB — BASIC METABOLIC PANEL
Anion gap: 8 (ref 5–15)
BUN: 28 mg/dL — ABNORMAL HIGH (ref 8–23)
CO2: 26 mmol/L (ref 22–32)
Calcium: 8.3 mg/dL — ABNORMAL LOW (ref 8.9–10.3)
Chloride: 100 mmol/L (ref 98–111)
Creatinine, Ser: 1.11 mg/dL (ref 0.61–1.24)
GFR, Estimated: >60 mL/min (ref >60)
Glucose, Bld: 102 mg/dL — ABNORMAL HIGH (ref 70–99)
Potassium: 4.2 mmol/L (ref 3.5–5.1)
Sodium: 134 mmol/L — ABNORMAL LOW (ref 135–145)

## 2023-07-26 LAB — CBC
HCT: 37.7 % — ABNORMAL LOW (ref 39.0–52.0)
Hemoglobin: 12.6 g/dL — ABNORMAL LOW (ref 13.0–17.0)
MCH: 32.2 pg (ref 26.0–34.0)
MCHC: 33.4 g/dL (ref 30.0–36.0)
MCV: 96.4 fL (ref 80.0–100.0)
Platelets: 326 10*3/uL (ref 150–400)
RBC: 3.91 MIL/uL — ABNORMAL LOW (ref 4.22–5.81)
RDW: 14.3 % (ref 11.5–15.5)
WBC: 7.3 10*3/uL (ref 4.0–10.5)
nRBC: 0 % (ref 0.0–0.2)

## 2023-07-26 NOTE — Progress Notes (Addendum)
PROGRESS NOTE    Kevin [name]  NGE:[id number] DOB: [birth year] DOA: 07/10/2023 PCP: Pcp, No   Assessment & Plan:   Principal Problem:   Acute on chronic combined systolic and diastolic CHF (congestive heart failure) (HCC) Active Problems:   Hypokalemia   Peripheral edema   Essential hypertension   Peripheral neuropathy   Dyslipidemia   Transaminitis   Noncompliance with medication regimen   Acute on chronic congestive heart failure (HCC)  Assessment and Plan:  Acute on chronic biventricular systolic CHF: EF 40-10%. Continue on aldactone, coreg, entresto, lasix, farxiga as per cardio   Atrial flutter: w/ RVR s/p cardioversion & now NSR. Continue on eliquis, coreg, digoxin    Urinary retention: continue w/ foley x 7 days. Will  have to follow w/ uro, Dr. Lonna Cobb, outpatient for voiding trial. Continue on proscar, flomax    Hypokalemia: WNL today     HLD: continue on statin     COPD: w/o exacerbation. Bronchodilators prn    Peripheral neuropathy: continue on gabapentin   HTN: continue on aldactone, coreg, entresto    Macrocytic anemia: b12, folate are WNL. No need for a transfusion currently. Will continue to monitor    Vitamin D deficiency: continue on vitamin D supplement    Mild cognitive impairment: continue on seroquel       DVT prophylaxis: eliquis  Code Status: full  Family Communication: discussed pt's care, Steward Drone, and answered her questions Disposition Plan: waiting on SNF placement   Level of care: Telemetry Cardiac  Status is: Inpatient Remains inpatient appropriate because: medically stable. Waiting SNF placement      Consultants:  Cardio   Procedures:   Antimicrobials:    Subjective: Pt c/o malaise   Objective: Vitals:   07/26/23 0426 07/26/23 0750 07/26/23 1155 07/26/23 1212  BP: 129/72 114/67 (!) 84/57 93/61  Pulse: 68 63 71 67  Resp: 18 16  18   Temp: 98.2 F (36.8 C) 98.7 F (37.1 C) 98.4 F (36.9 C) 97.7 F (36.5 C)   TempSrc:    Oral  SpO2: 99% 97% 100% 100%  Weight: 68.3 kg     Height:        Intake/Output Summary (Last 24 hours) at 07/26/2023 1450 Last data filed at 07/26/2023 1138 Gross per 24 hour  Intake --  Output 2000 ml  Net -2000 ml   Filed Weights   07/24/23 0551 07/25/23 0538 07/26/23 0426  Weight: 68.4 kg 68.1 kg 68.3 kg    Examination:  General exam: appears comfortable  Respiratory system: decreased breath sounds b/l  Cardiovascular system: S1/S2+. No rubs or clicks  Gastrointestinal system: Abd is soft, NT, ND & hypoactive bowel sounds  Central nervous system: alert & oriented. Moves all extremities  Psychiatry: Judgement and insight appears at baseline. Mood and affect appropriate      Data Reviewed: I have personally reviewed following labs and imaging studies  CBC: Recent Labs  Lab 07/21/23 0420 07/22/23 0433 07/23/23 0451 07/24/23 0520 07/26/23 0420  WBC 6.8 7.1 7.8 7.6 7.3  HGB 11.4* 11.8* 11.7* 11.9* 12.6*  HCT 34.1* 35.9* 35.4* 35.7* 37.7*  MCV 97.4 99.2 97.8 97.8 96.4  PLT 219 228 257 277 326   Basic Metabolic Panel: Recent Labs  Lab 07/20/23 0510 07/21/23 0420 07/22/23 0433 07/23/23 0451 07/24/23 0520 07/26/23 0420  NA 139 137 139 138 139 134*  K 4.4 3.9 4.0 4.3 4.5 4.2  CL 103 103 105 104 103 100  CO2 29 26 28 28 28  26  GLUCOSE 106* 124* 100* 98 145* 102*  BUN 30* 28* 24* 24* 25* 28*  CREATININE 1.11 1.20 1.00 1.16 1.11 1.11  CALCIUM 8.8* 8.3* 8.1* 8.4* 8.4* 8.3*  MG 2.5* 2.2 2.0 2.2 2.4  --    GFR: Estimated Creatinine Clearance: 63.2 mL/min (by C-G formula based on SCr of 1.11 mg/dL). Liver Function Tests: No results for input(s): "AST", "ALT", "ALKPHOS", "BILITOT", "PROT", "ALBUMIN" in the last 168 hours. No results for input(s): "LIPASE", "AMYLASE" in the last 168 hours. No results for input(s): "AMMONIA" in the last 168 hours. Coagulation Profile: No results for input(s): "INR", "PROTIME" in the last 168 hours. Cardiac  Enzymes: No results for input(s): "CKTOTAL", "CKMB", "CKMBINDEX", "TROPONINI" in the last 168 hours. BNP (last 3 results) No results for input(s): "PROBNP" in the last 8760 hours. HbA1C: No results for input(s): "HGBA1C" in the last 72 hours. CBG: No results for input(s): "GLUCAP" in the last 168 hours. Lipid Profile: No results for input(s): "CHOL", "HDL", "LDLCALC", "TRIG", "CHOLHDL", "LDLDIRECT" in the last 72 hours. Thyroid Function Tests: No results for input(s): "TSH", "T4TOTAL", "FREET4", "T3FREE", "THYROIDAB" in the last 72 hours. Anemia Panel: No results for input(s): "VITAMINB12", "FOLATE", "FERRITIN", "TIBC", "IRON", "RETICCTPCT" in the last 72 hours. Sepsis Labs: No results for input(s): "PROCALCITON", "LATICACIDVEN" in the last 168 hours.  No results found for this or any previous visit (from the past 240 hour(s)).       Radiology Studies: No results found.      Scheduled Meds:  apixaban  5 mg Oral BID   carvedilol  6.25 mg Oral BID WC   Chlorhexidine Gluconate Cloth  6 each Topical Daily   dapagliflozin propanediol  10 mg Oral Daily   digoxin  0.125 mg Oral Daily   finasteride  5 mg Oral QPM   furosemide  60 mg Oral Daily   gabapentin  300 mg Oral BID   iron polysaccharides  150 mg Oral Daily   leptospermum manuka honey  1 Application Topical Daily   pravastatin  20 mg Oral q1800   QUEtiapine  50 mg Oral BID   sacubitril-valsartan  1 tablet Oral BID   silver sulfADIAZINE   Topical Daily   spironolactone  25 mg Oral Daily   tamsulosin  0.4 mg Oral QPC supper   Vitamin D (Ergocalciferol)  50,000 Units Oral Q7 days   Continuous Infusions:   LOS: 16 days      Charise Killian, MD [facility] Pager [phone]  If 7PM-7AM, please contact night-coverage [email or web address] 07/26/2023, 2:50 PM

## 2023-07-26 NOTE — Plan of Care (Signed)
  Problem: Education: Goal: Knowledge of General Education information will improve Description: Including pain rating scale, medication(s)/side effects and non-pharmacologic comfort measures Outcome: Progressing   Problem: Health Behavior/Discharge Planning: Goal: Ability to manage health-related needs will improve Outcome: Progressing   Problem: Clinical Measurements: Goal: Ability to maintain clinical measurements within normal limits will improve Outcome: Progressing Goal: Will remain free from infection Outcome: Progressing Goal: Diagnostic test results will improve Outcome: Progressing   Problem: Nutrition: Goal: Adequate nutrition will be maintained Outcome: Progressing   Problem: Education: Goal: Ability to demonstrate management of disease process will improve Outcome: Progressing Goal: Ability to verbalize understanding of medication therapies will improve Outcome: Progressing   Problem: Activity: Goal: Capacity to carry out activities will improve Outcome: Progressing

## 2023-07-27 DIAGNOSIS — I5043 Acute on chronic combined systolic (congestive) and diastolic (congestive) heart failure: Secondary | ICD-10-CM | POA: Diagnosis not present

## 2023-07-27 LAB — BASIC METABOLIC PANEL
Anion gap: 6 (ref 5–15)
BUN: 32 mg/dL — ABNORMAL HIGH (ref 8–23)
CO2: 28 mmol/L (ref 22–32)
Calcium: 8.5 mg/dL — ABNORMAL LOW (ref 8.9–10.3)
Chloride: 103 mmol/L (ref 98–111)
Creatinine, Ser: 1.23 mg/dL (ref 0.61–1.24)
GFR, Estimated: >60 mL/min (ref >60)
Glucose, Bld: 110 mg/dL — ABNORMAL HIGH (ref 70–99)
Potassium: 4.5 mmol/L (ref 3.5–5.1)
Sodium: 137 mmol/L (ref 135–145)

## 2023-07-27 LAB — CBC
HCT: 37.5 % — ABNORMAL LOW (ref 39.0–52.0)
Hemoglobin: 12.3 g/dL — ABNORMAL LOW (ref 13.0–17.0)
MCH: 32.6 pg (ref 26.0–34.0)
MCHC: 32.8 g/dL (ref 30.0–36.0)
MCV: 99.5 fL (ref 80.0–100.0)
Platelets: 313 10*3/uL (ref 150–400)
RBC: 3.77 MIL/uL — ABNORMAL LOW (ref 4.22–5.81)
RDW: 14.4 % (ref 11.5–15.5)
WBC: 7.9 10*3/uL (ref 4.0–10.5)
nRBC: 0 % (ref 0.0–0.2)

## 2023-07-27 NOTE — Progress Notes (Signed)
Pt adamantly refused would care/dressing changes. Several attempts made. Charge nurse Tiffany notified.

## 2023-07-27 NOTE — Progress Notes (Addendum)
PROGRESS NOTE    Kevin [name]  UJW:[id number] DOB: 1956-01-05 DOA: 07/10/2023 PCP: Pcp, No   Assessment & Plan:   Principal Problem:   Acute on chronic combined systolic and diastolic CHF (congestive heart failure) (HCC) Active Problems:   Hypokalemia   Peripheral edema   Essential hypertension   Peripheral neuropathy   Dyslipidemia   Transaminitis   Noncompliance with medication regimen   Acute on chronic congestive heart failure (HCC)  Assessment and Plan:  Acute on chronic biventricular systolic CHF: EF 56-21%. Continue on coreg, entresto, aldactone, lasix, farxigo as per cardio   Atrial flutter: w/ RVR s/p cardioversion & now NSR. Continue on coreg, digoxin, eliquis   Urinary retention: continue w/ foley x 7 days. Will  have to follow w/ uro, Dr. Lonna Cobb, outpatient for voiding trial. Continue on flomax, proscar    Hypokalemia: WNL today    HLD: continue on statin    COPD: w/o exacerbation. Bronchodilators prn    Peripheral neuropathy: continue on gabapentin   HTN: continue on coreg, aldactone, entresto    Macrocytic anemia: b12, folate are WNL. No need for transfusion currently    Vitamin D deficiency: continue on vitamin D supplement    Mild cognitive impairment: continue on seroquel      DVT prophylaxis: eliquis  Code Status: full  Family Communication:  Disposition Plan: waiting on SNF placement   Level of care: Med-Surg  Status is: Inpatient Remains inpatient appropriate because: medically stable. Waiting SNF placement      Consultants:  Cardio   Procedures:   Antimicrobials:    Subjective: Pt c/o fatigue   Objective: Vitals:   07/26/23 1943 07/26/23 2326 07/27/23 0400 07/27/23 0551  BP: (!) 100/55 129/74 110/62   Pulse: 69 65  60  Resp: 18  (!) 21   Temp: 98.4 F (36.9 C) 98.3 F (36.8 C) 98.4 F (36.9 C)   TempSrc:  Oral Oral   SpO2: 100% 99%  100%  Weight:      Height:        Intake/Output Summary (Last 24 hours) at  07/27/2023 0835 Last data filed at 07/26/2023 2336 Gross per 24 hour  Intake --  Output 2200 ml  Net -2200 ml   Filed Weights   07/24/23 0551 07/25/23 0538 07/26/23 0426  Weight: 68.4 kg 68.1 kg 68.3 kg    Examination:  General exam: appears calm & comfortable  Respiratory system: diminished breath sounds b/l  Cardiovascular system: S1 & S2+. No rubs or clicks  Gastrointestinal system: abd is soft, NT, ND & normal bowel sounds Central nervous system: alert & oriented.moves all extremities  Psychiatry: Judgement and insight appears at baseline. Appropriate mood and affect     Data Reviewed: I have personally reviewed following labs and imaging studies  CBC: Recent Labs  Lab 07/22/23 0433 07/23/23 0451 07/24/23 0520 07/26/23 0420 07/27/23 0356  WBC 7.1 7.8 7.6 7.3 7.9  HGB 11.8* 11.7* 11.9* 12.6* 12.3*  HCT 35.9* 35.4* 35.7* 37.7* 37.5*  MCV 99.2 97.8 97.8 96.4 99.5  PLT 228 257 277 326 313   Basic Metabolic Panel: Recent Labs  Lab 07/21/23 0420 07/22/23 0433 07/23/23 0451 07/24/23 0520 07/26/23 0420 07/27/23 0356  NA 137 139 138 139 134* 137  K 3.9 4.0 4.3 4.5 4.2 4.5  CL 103 105 104 103 100 103  CO2 26 28 28 28 26 28   GLUCOSE 124* 100* 98 145* 102* 110*  BUN 28* 24* 24* 25* 28* 32*  CREATININE 1.20  1.00 1.16 1.11 1.11 1.23  CALCIUM 8.3* 8.1* 8.4* 8.4* 8.3* 8.5*  MG 2.2 2.0 2.2 2.4  --   --    GFR: Estimated Creatinine Clearance: 57.1 mL/min (by C-G formula based on SCr of 1.23 mg/dL). Liver Function Tests: No results for input(s): "AST", "ALT", "ALKPHOS", "BILITOT", "PROT", "ALBUMIN" in the last 168 hours. No results for input(s): "LIPASE", "AMYLASE" in the last 168 hours. No results for input(s): "AMMONIA" in the last 168 hours. Coagulation Profile: No results for input(s): "INR", "PROTIME" in the last 168 hours. Cardiac Enzymes: No results for input(s): "CKTOTAL", "CKMB", "CKMBINDEX", "TROPONINI" in the last 168 hours. BNP (last 3 results) No  results for input(s): "PROBNP" in the last 8760 hours. HbA1C: No results for input(s): "HGBA1C" in the last 72 hours. CBG: No results for input(s): "GLUCAP" in the last 168 hours. Lipid Profile: No results for input(s): "CHOL", "HDL", "LDLCALC", "TRIG", "CHOLHDL", "LDLDIRECT" in the last 72 hours. Thyroid Function Tests: No results for input(s): "TSH", "T4TOTAL", "FREET4", "T3FREE", "THYROIDAB" in the last 72 hours. Anemia Panel: No results for input(s): "VITAMINB12", "FOLATE", "FERRITIN", "TIBC", "IRON", "RETICCTPCT" in the last 72 hours. Sepsis Labs: No results for input(s): "PROCALCITON", "LATICACIDVEN" in the last 168 hours.  No results found for this or any previous visit (from the past 240 hour(s)).       Radiology Studies: No results found.      Scheduled Meds:  apixaban  5 mg Oral BID   carvedilol  6.25 mg Oral BID WC   Chlorhexidine Gluconate Cloth  6 each Topical Daily   dapagliflozin propanediol  10 mg Oral Daily   digoxin  0.125 mg Oral Daily   finasteride  5 mg Oral QPM   furosemide  60 mg Oral Daily   gabapentin  300 mg Oral BID   iron polysaccharides  150 mg Oral Daily   leptospermum manuka honey  1 Application Topical Daily   pravastatin  20 mg Oral q1800   QUEtiapine  50 mg Oral BID   sacubitril-valsartan  1 tablet Oral BID   silver sulfADIAZINE   Topical Daily   spironolactone  25 mg Oral Daily   tamsulosin  0.4 mg Oral QPC supper   Vitamin D (Ergocalciferol)  50,000 Units Oral Q7 days   Continuous Infusions:   LOS: 17 days      Charise Killian, MD [facility] Pager [phone]  If 7PM-7AM, please contact night-coverage [email or web address] 07/27/2023, 8:35 AM

## 2023-07-27 NOTE — Plan of Care (Signed)
  Problem: Education: Goal: Knowledge of General Education information will improve Description Including pain rating scale, medication(s)/side effects and non-pharmacologic comfort measures Outcome: Progressing   

## 2023-07-27 NOTE — Progress Notes (Signed)
In report from day shift I was told that patient had cut his foley catheter tube completely through with scissors on a previous shift. On my shift tonight, pt tied his foley catheter tube in a knot. Charge RN was able to get knot untied from foley catheter tube.

## 2023-07-27 NOTE — Plan of Care (Signed)
  Problem: Education: °Goal: Knowledge of General Education information will improve °Description: Including pain rating scale, medication(s)/side effects and non-pharmacologic comfort measures °Outcome: Progressing °  °Problem: Health Behavior/Discharge Planning: °Goal: Ability to manage health-related needs will improve °Outcome: Progressing °  °Problem: Clinical Measurements: °Goal: Ability to maintain clinical measurements within normal limits will improve °Outcome: Progressing °Goal: Will remain free from infection °Outcome: Progressing °Goal: Diagnostic test results will improve °Outcome: Progressing °Goal: Respiratory complications will improve °Outcome: Progressing °Goal: Cardiovascular complication will be avoided °Outcome: Progressing °  °Problem: Nutrition: °Goal: Adequate nutrition will be maintained °Outcome: Progressing °  °Problem: Safety: °Goal: Ability to remain free from injury will improve °Outcome: Progressing °  °

## 2023-07-28 ENCOUNTER — Encounter: Payer: Medicare HMO | Admitting: Family

## 2023-07-28 DIAGNOSIS — I5043 Acute on chronic combined systolic (congestive) and diastolic (congestive) heart failure: Secondary | ICD-10-CM | POA: Diagnosis not present

## 2023-07-28 LAB — BASIC METABOLIC PANEL WITH GFR
Anion gap: 8 (ref 5–15)
BUN: 32 mg/dL — ABNORMAL HIGH (ref 8–23)
CO2: 28 mmol/L (ref 22–32)
Calcium: 8.7 mg/dL — ABNORMAL LOW (ref 8.9–10.3)
Chloride: 101 mmol/L (ref 98–111)
Creatinine, Ser: 1.28 mg/dL — ABNORMAL HIGH (ref 0.61–1.24)
GFR, Estimated: >60 mL/min
Glucose, Bld: 112 mg/dL — ABNORMAL HIGH (ref 70–99)
Potassium: 4.5 mmol/L (ref 3.5–5.1)
Sodium: 137 mmol/L (ref 135–145)

## 2023-07-28 LAB — CBC
HCT: 37.2 % — ABNORMAL LOW (ref 39.0–52.0)
Hemoglobin: 12.4 g/dL — ABNORMAL LOW (ref 13.0–17.0)
MCH: 32.5 pg (ref 26.0–34.0)
MCHC: 33.3 g/dL (ref 30.0–36.0)
MCV: 97.6 fL (ref 80.0–100.0)
Platelets: 325 K/uL (ref 150–400)
RBC: 3.81 MIL/uL — ABNORMAL LOW (ref 4.22–5.81)
RDW: 14.3 % (ref 11.5–15.5)
WBC: 7.4 K/uL (ref 4.0–10.5)
nRBC: 0 % (ref 0.0–0.2)

## 2023-07-28 NOTE — TOC Progression Note (Addendum)
Transition of Care [facility]) - Progression Note    Patient Details  Name: Kevin [name] MRN: [id number] Date of Birth: [birth year]  [facility] CM/SW Contact  Margarito Liner, LCSW Phone Number: 07/28/2023, 10:54 AM  Clinical Narrative: SNF insurance authorization was started yesterday and is pending.    1:24 pm: Insurance requested updated PT note and MD note showing medical stability. CSW uploaded into [employer] portal.  Expected Discharge Plan and Services                                   HH Arranged: RN, PT, OT [facility] Agency: [facility] Date [facility] Agency Contacted: 07/13/23 Time HH Agency Contacted: [phone] Representative spoke with at [facility] Agency: Confirmed with Kandee Keen   Social Determinants of Health (SDOH) Interventions SDOH Screenings   Food Insecurity: No Food Insecurity (07/11/2023)  Housing: Low Risk  (07/14/2023)  Transportation Needs: No Transportation Needs (07/14/2023)  Recent Concern: Transportation Needs - Unmet Transportation Needs (07/11/2023)  Utilities: Not At Risk (07/11/2023)  Alcohol Screen: Low Risk  (07/14/2023)  Depression (PHQ2-9): Low Risk  (09/18/2021)  Financial Resource Strain: Low Risk  (07/14/2023)  Physical Activity: Insufficiently Active (01/20/2020)  Social Connections: Unknown (01/20/2020)  Stress: No Stress Concern Present (08/04/2019)  Tobacco Use: High Risk (07/11/2023)    Readmission Risk Interventions     No data to display

## 2023-07-28 NOTE — Progress Notes (Signed)
PROGRESS NOTE    Swan Handzel  WGN:[id number] DOB: 09/05/1955 DOA: 07/10/2023 PCP: Pcp, No   Assessment & Plan:   Principal Problem:   Acute on chronic combined systolic and diastolic CHF (congestive heart failure) (HCC) Active Problems:   Hypokalemia   Peripheral edema   Essential hypertension   Peripheral neuropathy   Dyslipidemia   Transaminitis   Noncompliance with medication regimen   Acute on chronic congestive heart failure (HCC)  Assessment and Plan:  Acute on chronic biventricular systolic CHF: EF 78-46%. Continue on coreg, entresto, aldactone, lasix, farxigo as per cardio   Atrial flutter: w/ RVR s/p cardioversion & now NSR. Continue on eliquis, digoxin, coreg    Urinary retention: continue w/ foley x 7 days. Will  have to follow w/ uro, Dr. Lonna Cobb, outpatient for voiding trial. Continue on proscar, flomax    Hypokalemia: WNL today    HLD: continue on statin    COPD: w/o exacerbation. Bronchodilators prn    Peripheral neuropathy: continue on gabapentin   HTN: continue on entresto, coreg, aldactone     Macrocytic anemia: b12, folate are WNL. No need for a transfusion currently   Vitamin D deficiency: continue on vit D supplement    Mild cognitive impairment: continue on seroquel      DVT prophylaxis: eliquis  Code Status: full  Family Communication:  Disposition Plan: waiting on SNF placement   Level of care: Med-Surg  Status is: Inpatient Remains inpatient appropriate because: medically stable. Waiting on insurance auth     Consultants:  Cardio   Procedures:   Antimicrobials:    Subjective: Pt c/o malaise   Objective: Vitals:   07/28/23 0637 07/28/23 0815 07/28/23 1040 07/28/23 1301  BP:  135/73  90/66  Pulse:  63 66 66  Resp:  17    Temp:  98.6 F (37 C)  97.6 F (36.4 C)  TempSrc:      SpO2:  100%  100%  Weight: 69.4 kg     Height:        Intake/Output Summary (Last 24 hours) at 07/28/2023 1447 Last data filed at  07/28/2023 0655 Gross per 24 hour  Intake 680 ml  Output 1800 ml  Net -1120 ml   Filed Weights   07/25/23 0538 07/26/23 0426 07/28/23 0637  Weight: 68.1 kg 68.3 kg 69.4 kg    Examination:  General exam: appears comfortable   Respiratory system: decreased breath sounds  Cardiovascular system: S1/S2+. No rubs or clicks  Gastrointestinal system: abd is soft, NT, ND & hypoactive bowel sounds  Central nervous system: alert & oriented. Moves all extremities  Psychiatry: judgement and insight appears at baseline. Appropriate mood and affect     Data Reviewed: I have personally reviewed following labs and imaging studies  CBC: Recent Labs  Lab 07/23/23 0451 07/24/23 0520 07/26/23 0420 07/27/23 0356 07/28/23 0518  WBC 7.8 7.6 7.3 7.9 7.4  HGB 11.7* 11.9* 12.6* 12.3* 12.4*  HCT 35.4* 35.7* 37.7* 37.5* 37.2*  MCV 97.8 97.8 96.4 99.5 97.6  PLT 257 277 326 313 325   Basic Metabolic Panel: Recent Labs  Lab 07/22/23 0433 07/23/23 0451 07/24/23 0520 07/26/23 0420 07/27/23 0356 07/28/23 0518  NA 139 138 139 134* 137 137  K 4.0 4.3 4.5 4.2 4.5 4.5  CL 105 104 103 100 103 101  CO2 28 28 28 26 28 28   GLUCOSE 100* 98 145* 102* 110* 112*  BUN 24* 24* 25* 28* 32* 32*  CREATININE 1.00 1.16 1.11 1.11  1.23 1.28*  CALCIUM 8.1* 8.4* 8.4* 8.3* 8.5* 8.7*  MG 2.0 2.2 2.4  --   --   --    GFR: Estimated Creatinine Clearance: 54.9 mL/min (A) (by C-G formula based on SCr of 1.28 mg/dL (H)). Liver Function Tests: No results for input(s): "AST", "ALT", "ALKPHOS", "BILITOT", "PROT", "ALBUMIN" in the last 168 hours. No results for input(s): "LIPASE", "AMYLASE" in the last 168 hours. No results for input(s): "AMMONIA" in the last 168 hours. Coagulation Profile: No results for input(s): "INR", "PROTIME" in the last 168 hours. Cardiac Enzymes: No results for input(s): "CKTOTAL", "CKMB", "CKMBINDEX", "TROPONINI" in the last 168 hours. BNP (last 3 results) No results for input(s): "PROBNP" in  the last 8760 hours. HbA1C: No results for input(s): "HGBA1C" in the last 72 hours. CBG: No results for input(s): "GLUCAP" in the last 168 hours. Lipid Profile: No results for input(s): "CHOL", "HDL", "LDLCALC", "TRIG", "CHOLHDL", "LDLDIRECT" in the last 72 hours. Thyroid Function Tests: No results for input(s): "TSH", "T4TOTAL", "FREET4", "T3FREE", "THYROIDAB" in the last 72 hours. Anemia Panel: No results for input(s): "VITAMINB12", "FOLATE", "FERRITIN", "TIBC", "IRON", "RETICCTPCT" in the last 72 hours. Sepsis Labs: No results for input(s): "PROCALCITON", "LATICACIDVEN" in the last 168 hours.  No results found for this or any previous visit (from the past 240 hour(s)).       Radiology Studies: No results found.      Scheduled Meds:  apixaban  5 mg Oral BID   carvedilol  6.25 mg Oral BID WC   Chlorhexidine Gluconate Cloth  6 each Topical Daily   dapagliflozin propanediol  10 mg Oral Daily   digoxin  0.125 mg Oral Daily   finasteride  5 mg Oral QPM   furosemide  60 mg Oral Daily   gabapentin  300 mg Oral BID   iron polysaccharides  150 mg Oral Daily   leptospermum manuka honey  1 Application Topical Daily   pravastatin  20 mg Oral q1800   QUEtiapine  50 mg Oral BID   sacubitril-valsartan  1 tablet Oral BID   silver sulfADIAZINE   Topical Daily   spironolactone  25 mg Oral Daily   tamsulosin  0.4 mg Oral QPC supper   Vitamin D (Ergocalciferol)  50,000 Units Oral Q7 days   Continuous Infusions:   LOS: 18 days      Charise Killian, MD [facility] Pager [phone]  If 7PM-7AM, please contact night-coverage [email or web address] 07/28/2023, 2:47 PM

## 2023-07-28 NOTE — Progress Notes (Signed)
Physical Therapy Treatment Patient Details Name: Kevin [name] MRN: [id number] DOB: [birth year] Today's Date: 07/28/2023   History of Present Illness Pt is a 67 y/o admitted 07/10/23 for acute on chronic combined systolic and diastolic CHF and a flutter. PMH includes: HTN, peripheral neuropathy, dyslipidemia, and COPD.    PT Comments  Patient was cooperative throughout session. He is eager to be discharged to rehab. Patient requires safety cues with mobility for foley catheter management and is slightly impulsive with mobility. Supervision provided for transfers. Patient ambulated in hallway with frequent cues for posture with brief carry over demonstrated. One standing rest break secondary to fatigue. Patient walked a short distance without rolling walker with more pronounced flexed posture and reaching for furniture for support. Recommend to continue using rolling walker for safety with walking at this time. Anticipate patient could benefit from continued PT <3 hours/day after this hospital stay.    If plan is discharge home, recommend the following: Supervision due to cognitive status;Assist for transportation;Help with stairs or ramp for entrance;A little help with walking and/or transfers;A little help with bathing/dressing/bathroom;Direct supervision/assist for medications management;Direct supervision/assist for financial management;Assistance with cooking/housework   Can travel by private vehicle        Equipment Recommendations  None recommended by PT    Recommendations for Other Services       Precautions / Restrictions Precautions Precautions: Fall Restrictions Weight Bearing Restrictions: No     Mobility  Bed Mobility Overal bed mobility: Modified Independent                  Transfers Overall transfer level: Needs assistance Equipment used: None Transfers: Sit to/from Stand Sit to Stand: Supervision           General transfer comment: supervision for safety.  occasional safety cues required as patient is mildly impulsive and for foley catheter management with mobility    Ambulation/Gait Ambulation/Gait assistance: Contact guard assist Gait Distance (Feet): 190 Feet Assistive device: Rolling walker (2 wheels) Gait Pattern/deviations: Step-through pattern, Decreased stride length, Trunk flexed Gait velocity: decreased     General Gait Details: frequent cues for upright standing posture with brief carry over. one standing rest break where patient was leaning over on rolling walker with forearms secondary to fatigue. patient ambulated a short distance without rolling walker with more pronounced flexed posture and reaching for furniture for support. recommend to continue using rolling walker for safety   Stairs             Wheelchair Mobility     Tilt Bed    Modified Rankin (Stroke Patients Only)       Balance Overall balance assessment: Needs assistance Sitting-balance support: Feet supported Sitting balance-Leahy Scale: Good     Standing balance support: Bilateral upper extremity supported, During functional activity Standing balance-Leahy Scale: Fair Standing balance comment: relying on rolling walker or UE support in standing                            Cognition Arousal: Alert Behavior During Therapy: WFL for tasks assessed/performed Overall Cognitive Status: No family/caregiver present to determine baseline cognitive functioning Area of Impairment: Safety/judgement, Orientation, Attention, Following commands, Awareness, Problem solving                 Orientation Level: Disoriented to, Time   Memory: Decreased short-term memory Following Commands: Follows one step commands consistently, Follows one step commands with increased time Safety/Judgement: Decreased awareness of  safety, Decreased awareness of deficits              Exercises      General Comments        Pertinent Vitals/Pain  Pain Assessment Pain Assessment: No/denies pain    Home Living                          Prior Function            PT Goals (current goals can now be found in the care plan section) Acute Rehab PT Goals Patient Stated Goal: to get to rehab and then home PT Goal Formulation: With patient Time For Goal Achievement: 08/02/23 Potential to Achieve Goals: Good Progress towards PT goals: Progressing toward goals    Frequency    Min 1X/week      PT Plan      Co-evaluation              AM-PAC PT "6 Clicks" Mobility   Outcome Measure  Help needed turning from your back to your side while in a flat bed without using bedrails?: None Help needed moving from lying on your back to sitting on the side of a flat bed without using bedrails?: None Help needed moving to and from a bed to a chair (including a wheelchair)?: None Help needed standing up from a chair using your arms (e.g., wheelchair or bedside chair)?: None Help needed to walk in hospital room?: A Little Help needed climbing 3-5 steps with a railing? : A Little 6 Click Score: 22    End of Session Equipment Utilized During Treatment: Gait belt Activity Tolerance: Patient tolerated treatment well Patient left: in bed;with call bell/phone within reach Nurse Communication: Mobility status PT Visit Diagnosis: Other abnormalities of gait and mobility (R26.89);Difficulty in walking, not elsewhere classified (R26.2);Adult, failure to thrive (R62.7);Other (comment)     Time: 5188-4166 PT Time Calculation (min) (ACUTE ONLY): 24 min  Charges:    $Gait Training: 8-22 mins $Therapeutic Activity: 8-22 mins PT General Charges $$ ACUTE PT VISIT: 1 Visit                     Donna Bernard, PT, MPT   Ina Homes 07/28/2023, 1:17 PM

## 2023-07-28 NOTE — Plan of Care (Signed)
Alert and oriented. Dressing changes completed.  Awaiting disposition.   Problem: Education: Goal: Knowledge of General Education information will improve Description: Including pain rating scale, medication(s)/side effects and non-pharmacologic comfort measures Outcome: Progressing   Problem: Health Behavior/Discharge Planning: Goal: Ability to manage health-related needs will improve Outcome: Progressing   Problem: Clinical Measurements: Goal: Ability to maintain clinical measurements within normal limits will improve Outcome: Progressing Goal: Will remain free from infection Outcome: Progressing Goal: Diagnostic test results will improve Outcome: Progressing

## 2023-07-29 DIAGNOSIS — I5043 Acute on chronic combined systolic (congestive) and diastolic (congestive) heart failure: Secondary | ICD-10-CM | POA: Diagnosis not present

## 2023-07-29 LAB — CBC
HCT: 36.8 % — ABNORMAL LOW (ref 39.0–52.0)
Hemoglobin: 12.4 g/dL — ABNORMAL LOW (ref 13.0–17.0)
MCH: 32.5 pg (ref 26.0–34.0)
MCHC: 33.7 g/dL (ref 30.0–36.0)
MCV: 96.6 fL (ref 80.0–100.0)
Platelets: 324 10*3/uL (ref 150–400)
RBC: 3.81 MIL/uL — ABNORMAL LOW (ref 4.22–5.81)
RDW: 14.4 % (ref 11.5–15.5)
WBC: 7.4 10*3/uL (ref 4.0–10.5)
nRBC: 0 % (ref 0.0–0.2)

## 2023-07-29 LAB — BASIC METABOLIC PANEL
Anion gap: 9 (ref 5–15)
BUN: 31 mg/dL — ABNORMAL HIGH (ref 8–23)
CO2: 28 mmol/L (ref 22–32)
Calcium: 8.5 mg/dL — ABNORMAL LOW (ref 8.9–10.3)
Chloride: 101 mmol/L (ref 98–111)
Creatinine, Ser: 1.11 mg/dL (ref 0.61–1.24)
GFR, Estimated: >60 mL/min (ref >60)
Glucose, Bld: 106 mg/dL — ABNORMAL HIGH (ref 70–99)
Potassium: 4.3 mmol/L (ref 3.5–5.1)
Sodium: 138 mmol/L (ref 135–145)

## 2023-07-29 NOTE — TOC Progression Note (Addendum)
Transition of Care [facility]) - Progression Note    Patient Details  Name: Kevin [name] MRN: [id number] Date of Birth: 1956-04-30  Transition of Care [facility]) CM/SW Contact  Liliana Cline, LCSW Phone Number: 07/29/2023, 9:11 AM  Clinical Narrative:    [facility], [facility] is pending.  10:28- Auth still pending.  1:35- Peer to peer denied. If he wants to appeal, the number for [employer] Appeals is [phone]. It takes about 72 hours. Fax number is [phone].   2:45- Updated the patient about appeal information. He plans to call and appeal.  3:30- Attempted call to sister Judeth Cornfield to inform her about appeal option as well per MD, left VM for [facility] at [phone]        Expected Discharge Plan and Services                                   HH Arranged: RN, PT, OT [facility] Agency: [facility] Date [facility] Agency Contacted: 07/13/23 Time HH Agency Contacted: [phone] Representative spoke with at [facility] Agency: Confirmed with Kandee Keen   Social Determinants of Health (SDOH) Interventions SDOH Screenings   Food Insecurity: No Food Insecurity (07/11/2023)  Housing: Low Risk  (07/14/2023)  Transportation Needs: No Transportation Needs (07/14/2023)  Recent Concern: Transportation Needs - Unmet Transportation Needs (07/11/2023)  Utilities: Not At Risk (07/11/2023)  Alcohol Screen: Low Risk  (07/14/2023)  Depression (PHQ2-9): Low Risk  (09/18/2021)  Financial Resource Strain: Low Risk  (07/14/2023)  Physical Activity: Insufficiently Active (01/20/2020)  Social Connections: Unknown (01/20/2020)  Stress: No Stress Concern Present (08/04/2019)  Tobacco Use: High Risk (07/11/2023)    Readmission Risk Interventions     No data to display

## 2023-07-29 NOTE — Plan of Care (Signed)

## 2023-07-29 NOTE — Progress Notes (Signed)
Occupational Therapy Treatment Patient Details Name: Kevin [name] MRN: [id number] DOB: Apr 24, 1956 Today's Date: 07/29/2023   History of present illness Pt is a 67 y/o admitted 07/10/23 for acute on chronic combined systolic and diastolic CHF and a flutter. PMH includes: HTN, peripheral neuropathy, dyslipidemia, and COPD.   OT comments  Kevin [name] was seen for OT treatment on this date. Upon arrival to room pt seated in recliner with NSG staff in room finishing up wound care. Pt agreeable to OT Tx session. OT facilitated ADL management with education and assistance as described below. See ADL section for additional details regarding occupational performance. Pt continues to be functionally limited by decreased safety awareness, limited balance, & decreased activity tolerance. Pt return verbalizes understanding of education provided t/o session. Pt is progressing toward OT goals and continues to benefit from skilled OT services to maximize return to PLOF and minimize risk of future falls, injury, caregiver burden, and readmission. Will continue to follow POC as written. Discharge recommendation remains appropriate.        If plan is discharge home, recommend the following:  A little help with walking and/or transfers;Help with stairs or ramp for entrance;Assist for transportation;Assistance with cooking/housework;A lot of help with bathing/dressing/bathroom;Direct supervision/assist for financial management;Supervision due to cognitive status;Direct supervision/assist for medications management   Equipment Recommendations  Tub/shower bench    Recommendations for Other Services      Precautions / Restrictions Precautions Precautions: Fall Restrictions Weight Bearing Restrictions: No       Mobility Bed Mobility Overal bed mobility: Needs Assistance Bed Mobility: Sit to Supine       Sit to supine: Supervision, Used rails, HOB elevated        Transfers Overall transfer level: Needs  assistance Equipment used: None Transfers: Sit to/from Stand Sit to Stand: Contact guard assist, Min assist           General transfer comment: MIN A to step pivot with HHA, CGA for safety with transfers using a RW. Pt able to perform functional mobility of HH distance with CGA for safety and consistent safety cueing t/o session.     Balance Overall balance assessment: Needs assistance Sitting-balance support: Feet supported Sitting balance-Kevin [name] Scale: Good Sitting balance - Comments: steady static sitting at EOB.   Standing balance support: Bilateral upper extremity supported, During functional activity Standing balance-Kevin [name] Scale: Fair Standing balance comment: relying on rolling walker or UE support in standing                           ADL either performed or assessed with clinical judgement   ADL Overall ADL's : Needs assistance/impaired                     Lower Body Dressing: Minimal assistance;Cueing for safety;Sitting/lateral leans Lower Body Dressing Details (indicate cue type and reason): Pt requires MIN A to don bilat hospital socks this date. He is noted to have difficulty with LB access and requires assistance to bring socks over toes. He is able to pull socks up from mid-foot range. Toilet Transfer: Minimal assistance;Moderate assistance;Stand-pivot;Cueing for safety Toilet Transfer Details (indicate cue type and reason): simulated to/from recliner<>bed without a device. Decreased safety awareness appreciated.         Functional mobility during ADLs: Contact guard assist;Rolling walker (2 wheels) General ADL Comments: Forward flexed posture throughout, pt with poor safety awareness, requires cues for upright trunk and safe RW use. Multimodal cueing rquired  to attend to foley catheter bag prior to attempting functional transfers. Increased assist required during transfers w/o a device.    Extremity/Trunk Assessment Upper Extremity  Assessment Upper Extremity Assessment: Generalized weakness   Lower Extremity Assessment Lower Extremity Assessment: Generalized weakness   Cervical / Trunk Assessment Cervical / Trunk Assessment: Kyphotic    Vision Patient Visual Report: No change from baseline     Perception     Praxis      Cognition Arousal: Alert Behavior During Therapy: WFL for tasks assessed/performed Overall Cognitive Status: No family/caregiver present to determine baseline cognitive functioning Area of Impairment: Safety/judgement, Problem solving, Awareness, Memory, Following commands                     Memory: Decreased short-term memory Following Commands: Follows one step commands consistently, Follows one step commands with increased time Safety/Judgement: Decreased awareness of safety, Decreased awareness of deficits Awareness: Emergent Problem Solving: Slow processing, Difficulty sequencing, Requires verbal cues General Comments: Pt was pleasant and cooperative t/o session. Ongoing cognitive deficits appreciated during functional activity.        Exercises Other Exercises Other Exercises: OT facilitated ADL management with education and assistance as described above.    Shoulder Instructions       General Comments      Pertinent Vitals/ Pain       Pain Assessment Pain Assessment: No/denies pain  Home Living                                          Prior Functioning/Environment              Frequency  Min 1X/week        Progress Toward Goals  OT Goals(current goals can now be found in the care plan section)  Progress towards OT goals: Progressing toward goals  Acute Rehab OT Goals Patient Stated Goal: to feel better OT Goal Formulation: With patient Time For Goal Achievement: 08/08/23 Potential to Achieve Goals: Good  Plan      Co-evaluation                 AM-PAC OT "6 Clicks" Daily Activity     Outcome Measure   Help  from another person eating meals?: None Help from another person taking care of personal grooming?: None Help from another person toileting, which includes using toliet, bedpan, or urinal?: A Little Help from another person bathing (including washing, rinsing, drying)?: A Lot Help from another person to put on and taking off regular upper body clothing?: None Help from another person to put on and taking off regular lower body clothing?: A Lot 6 Click Score: 19    End of Session Equipment Utilized During Treatment: Rolling walker (2 wheels)  OT Visit Diagnosis: Other abnormalities of gait and mobility (R26.89);Unsteadiness on feet (R26.81)   Activity Tolerance Patient tolerated treatment well   Patient Left in bed;with bed alarm set;with call bell/phone within reach   Nurse Communication Mobility status        Time: 1346-1406 OT Time Calculation (min): 20 min  Charges: OT General Charges $OT Visit: 1 Visit OT Treatments $Self Care/Home Management : 8-22 mins  Rockney Ghee, M.S., OTR/L 07/29/23, 3:06 PM

## 2023-07-29 NOTE — Progress Notes (Signed)
PROGRESS NOTE    HPI was taken from Dr. Arville Care: Kevin [name] is a 67 y.o. male with medical history significant for chronic combined systolic diastolic CHF, COPD, type 2 diabetes mellitus and hypertension, who presented to the emergency room with acute onset of worsening dyspnea with associated significant lower extremity edema and dyspnea on exertion.  He admits to paroxysmal nocturnal dyspnea as well as orthopnea.  He denied any fever or chills.  No cough however has been having occasional wheezing.  He ran out of his diuretic therapy recently.  No chest pain or palpitations.  No dysuria, oliguria or hematuria or flank pain.  No nausea or vomiting or abdominal pain.   ED Course: When he was in the ER vital signs revealed a blood pressure of 143/109 with heart rate of 129.  Labs revealed a high sensitive troponin I 114 and later 125 I and BNP of 2153.9.  CBC was unremarkable and CMP EKG as reviewed by me :showed sinus tachycardia with rate 128 with left axis deviation and LVH Imaging: Two-view chest x-ray showed cardiomegaly with no acute cardiopulmonary disease.   The patient was given 40 mg of IV Lasix, 4 mg of IV morphine sulfate and 4 mg of IV Zofran.  He will be admitted to a cardiac telemetry bed for further evaluation and management.  As per Dr. Mayford Knife 11/29-12/3/24: Medically stable this week. PT/OT recs SNF. Pt's insurance denied SNF even after peer to peer. CM was updated and so was pt. Pt plans to appeal.    Kevin [name]  UVO:[id number] DOB: 04-Jun-1956 DOA: 07/10/2023 PCP: Pcp, No   Assessment & Plan:   Principal Problem:   Acute on chronic combined systolic and diastolic CHF (congestive heart failure) (HCC) Active Problems:   Hypokalemia   Peripheral edema   Essential hypertension   Peripheral neuropathy   Dyslipidemia   Transaminitis   Noncompliance with medication regimen   Acute on chronic congestive heart failure (HCC)  Assessment and Plan:  Acute on chronic  biventricular systolic CHF: EF 74-25%. Continue on coreg, entresto, aldactone, lasix, farxigo as per cardio   Atrial flutter: w/ RVR s/p cardioversion & now NSR. Continue on coreg, digoxin, eliquis    Urinary retention: continue w/ foley x 7 days. Will  have to follow w/ uro, Dr. Lonna Cobb, outpatient for voiding trial. Continue on flomax, proscar   Hypokalemia: WNL today     HLD: continue on statin    COPD: w/o exacerbation. Bronchodilators prn    Peripheral neuropathy: continue on gabapentin   HTN: continue on aldactone, entresto, coreg    Macrocytic anemia: b12, folate are WNL. No need for a transfusion currently    Vitamin D deficiency: continue on vit D supplement    Mild cognitive impairment: continue on seroquel      DVT prophylaxis: eliquis  Code Status: full  Family Communication:  Disposition Plan: pt's insurance denied SNF placement despite peer to peer. Pt plans to appeal   Level of care: Telemetry Medical  Status is: Inpatient Remains inpatient appropriate because: medically stable. Pt's insurance denied the SNF despite the peer to peer     Consultants:  Cardio   Procedures:   Antimicrobials:    Subjective: Pt c/o fatigue   Objective: Vitals:   07/28/23 2022 07/28/23 2259 07/29/23 0353 07/29/23 0832  BP: 124/75 123/76 123/77 121/76  Pulse: 69 65 (!) 58 (!) 56  Resp: 18 18 18 16   Temp: 98.4 F (36.9 C) 98.3 F (36.8 C) 98 F (  36.7 C) 98.8 F (37.1 C)  TempSrc:    Oral  SpO2: 100% 99% 100% 100%  Weight:      Height:        Intake/Output Summary (Last 24 hours) at 07/29/2023 1132 Last data filed at 07/29/2023 1017 Gross per 24 hour  Intake --  Output 2250 ml  Net -2250 ml   Filed Weights   07/25/23 0538 07/26/23 0426 07/28/23 0637  Weight: 68.1 kg 68.3 kg 69.4 kg    Examination:  General exam: appears calm  Respiratory system: diminished breath sounds b/l Cardiovascular system: S1 & S2+. No rubs or clicks  Gastrointestinal  system: abd is soft, NT, ND & normal bowel sounds  Central nervous system: alert & oriented. Moves all extremities Psychiatry: judgement and insight appears at baseline. Flat mood and affect    Data Reviewed: I have personally reviewed following labs and imaging studies  CBC: Recent Labs  Lab 07/24/23 0520 07/26/23 0420 07/27/23 0356 07/28/23 0518 07/29/23 0440  WBC 7.6 7.3 7.9 7.4 7.4  HGB 11.9* 12.6* 12.3* 12.4* 12.4*  HCT 35.7* 37.7* 37.5* 37.2* 36.8*  MCV 97.8 96.4 99.5 97.6 96.6  PLT 277 326 313 325 324   Basic Metabolic Panel: Recent Labs  Lab 07/23/23 0451 07/24/23 0520 07/26/23 0420 07/27/23 0356 07/28/23 0518 07/29/23 0440  NA 138 139 134* 137 137 138  K 4.3 4.5 4.2 4.5 4.5 4.3  CL 104 103 100 103 101 101  CO2 28 28 26 28 28 28   GLUCOSE 98 145* 102* 110* 112* 106*  BUN 24* 25* 28* 32* 32* 31*  CREATININE 1.16 1.11 1.11 1.23 1.28* 1.11  CALCIUM 8.4* 8.4* 8.3* 8.5* 8.7* 8.5*  MG 2.2 2.4  --   --   --   --    GFR: Estimated Creatinine Clearance: 63.3 mL/min (by C-G formula based on SCr of 1.11 mg/dL). Liver Function Tests: No results for input(s): "AST", "ALT", "ALKPHOS", "BILITOT", "PROT", "ALBUMIN" in the last 168 hours. No results for input(s): "LIPASE", "AMYLASE" in the last 168 hours. No results for input(s): "AMMONIA" in the last 168 hours. Coagulation Profile: No results for input(s): "INR", "PROTIME" in the last 168 hours. Cardiac Enzymes: No results for input(s): "CKTOTAL", "CKMB", "CKMBINDEX", "TROPONINI" in the last 168 hours. BNP (last 3 results) No results for input(s): "PROBNP" in the last 8760 hours. HbA1C: No results for input(s): "HGBA1C" in the last 72 hours. CBG: No results for input(s): "GLUCAP" in the last 168 hours. Lipid Profile: No results for input(s): "CHOL", "HDL", "LDLCALC", "TRIG", "CHOLHDL", "LDLDIRECT" in the last 72 hours. Thyroid Function Tests: No results for input(s): "TSH", "T4TOTAL", "FREET4", "T3FREE", "THYROIDAB"  in the last 72 hours. Anemia Panel: No results for input(s): "VITAMINB12", "FOLATE", "FERRITIN", "TIBC", "IRON", "RETICCTPCT" in the last 72 hours. Sepsis Labs: No results for input(s): "PROCALCITON", "LATICACIDVEN" in the last 168 hours.  No results found for this or any previous visit (from the past 240 hour(s)).       Radiology Studies: No results found.      Scheduled Meds:  apixaban  5 mg Oral BID   carvedilol  6.25 mg Oral BID WC   Chlorhexidine Gluconate Cloth  6 each Topical Daily   dapagliflozin propanediol  10 mg Oral Daily   digoxin  0.125 mg Oral Daily   finasteride  5 mg Oral QPM   furosemide  60 mg Oral Daily   gabapentin  300 mg Oral BID   iron polysaccharides  150 mg Oral  Daily   leptospermum manuka honey  1 Application Topical Daily   pravastatin  20 mg Oral q1800   QUEtiapine  50 mg Oral BID   sacubitril-valsartan  1 tablet Oral BID   silver sulfADIAZINE   Topical Daily   spironolactone  25 mg Oral Daily   tamsulosin  0.4 mg Oral QPC supper   Vitamin D (Ergocalciferol)  50,000 Units Oral Q7 days   Continuous Infusions:   LOS: 19 days      Charise Killian, MD [facility] Pager [phone]  If 7PM-7AM, please contact night-coverage [email or web address] 07/29/2023, 11:32 AM

## 2023-07-30 DIAGNOSIS — I5043 Acute on chronic combined systolic (congestive) and diastolic (congestive) heart failure: Secondary | ICD-10-CM | POA: Diagnosis not present

## 2023-07-30 LAB — BASIC METABOLIC PANEL
Anion gap: 7 (ref 5–15)
BUN: 36 mg/dL — ABNORMAL HIGH (ref 8–23)
CO2: 29 mmol/L (ref 22–32)
Calcium: 8.7 mg/dL — ABNORMAL LOW (ref 8.9–10.3)
Chloride: 101 mmol/L (ref 98–111)
Creatinine, Ser: 1.27 mg/dL — ABNORMAL HIGH (ref 0.61–1.24)
GFR, Estimated: >60 mL/min (ref >60)
Glucose, Bld: 131 mg/dL — ABNORMAL HIGH (ref 70–99)
Potassium: 4.4 mmol/L (ref 3.5–5.1)
Sodium: 137 mmol/L (ref 135–145)

## 2023-07-30 LAB — CBC
HCT: 40 % (ref 39.0–52.0)
Hemoglobin: 13.1 g/dL (ref 13.0–17.0)
MCH: 32.3 pg (ref 26.0–34.0)
MCHC: 32.8 g/dL (ref 30.0–36.0)
MCV: 98.8 fL (ref 80.0–100.0)
Platelets: 327 10*3/uL (ref 150–400)
RBC: 4.05 MIL/uL — ABNORMAL LOW (ref 4.22–5.81)
RDW: 14.2 % (ref 11.5–15.5)
WBC: 7.7 10*3/uL (ref 4.0–10.5)
nRBC: 0 % (ref 0.0–0.2)

## 2023-07-30 NOTE — TOC Progression Note (Signed)
Transition of Care [facility]) - Progression Note    Patient Details  Name: Kevin [name] MRN: [id number] Date of Birth: 04-08-1956  Transition of Care [facility]) CM/SW Contact  Margarito Liner, LCSW Phone Number: 07/30/2023, 12:03 PM  Clinical Narrative:   Patient said he gave the appeal information to his sister. CSW left her a voicemail with appeal phone number.  Expected Discharge Plan and Services                                   HH Arranged: RN, PT, OT [facility] Agency: [facility] Date [facility] Agency Contacted: 07/13/23 Time HH Agency Contacted: [phone] Representative spoke with at [facility] Agency: Confirmed with Kandee Keen   Social Determinants of Health (SDOH) Interventions SDOH Screenings   Food Insecurity: No Food Insecurity (07/11/2023)  Housing: Low Risk  (07/14/2023)  Transportation Needs: No Transportation Needs (07/14/2023)  Recent Concern: Transportation Needs - Unmet Transportation Needs (07/11/2023)  Utilities: Not At Risk (07/11/2023)  Alcohol Screen: Low Risk  (07/14/2023)  Depression (PHQ2-9): Low Risk  (09/18/2021)  Financial Resource Strain: Low Risk  (07/14/2023)  Physical Activity: Insufficiently Active (01/20/2020)  Social Connections: Unknown (01/20/2020)  Stress: No Stress Concern Present (08/04/2019)  Tobacco Use: High Risk (07/11/2023)    Readmission Risk Interventions     No data to display

## 2023-07-30 NOTE — Progress Notes (Signed)
PT Cancellation Note  Patient Details Name: Kevin [name] MRN: [id number] DOB: Aug 28, 1955   Cancelled Treatment:    Reason Eval/Treat Not Completed: Patient declined, no reason specified (Patient reports he already walked this morning and declined mobility at this time. PT will continue with attempts)  Donna Bernard, PT, MPT  Ina Homes 07/30/2023, 2:00 PM

## 2023-07-30 NOTE — Plan of Care (Signed)

## 2023-07-30 NOTE — Progress Notes (Addendum)
Progress Note    Kevin [name]  ZOX:[id number] DOB: Dec 26, 1955  DOA: 07/10/2023 PCP: Pcp, No      Brief Narrative:    Medical records reviewed and are as summarized below:  Kevin [name] is a 67 y.o. male with medical history significant for chronic combined systolic and diastolic CHF, COPD, type II DM, hypertension, who presented to the hospital because of worsening shortness of breath, orthopnea, paroxysmal nocturnal dyspnea and bilateral leg and feet swelling associated with formation of blisters and drainage on the legs.   He was admitted to the hospital for acute exacerbation of chronic systolic and diastolic CHF.     Assessment/Plan:   Principal Problem:   Acute on chronic combined systolic and diastolic CHF (congestive heart failure) (HCC) Active Problems:   Hypokalemia   Peripheral edema   Essential hypertension   Peripheral neuropathy   Dyslipidemia   Transaminitis   Noncompliance with medication regimen   Acute on chronic congestive heart failure (HCC)    Body mass index is 23.33 kg/m.   Acute on chronic systolic and diastolic CHF, nonischemic cardiomyopathy: Continue Lasix, carvedilol, Entresto, spironolactone, digoxin and Farxiga. 2D echo on 07/12/2023 showed EF estimated at 20 to 25%, moderate LVH, moderately reduced RV function, indeterminate LV diastolic parameters, moderate to severe tricuspid regurgitation Cardiac MRI showed severely reduced RV function and LV systolic function but no evidence of myocardial infiltrative disease.   Atrial flutter/fibrillation with RVR: S/p cardioversion on 07/17/2023.   Acute urinary retention: Discontinue Foley catheter and start voiding trial.  Continue Flomax and finasteride.   Macrocytic anemia: Improved.  H&H stable.   Left hip wound (unroofed blister wound): Continue local wound care   Comorbidities include COPD, vitamin D deficiency, hyperlipidemia, peripheral neuropathy, hypertension   Awaiting  placement to SNF.  Insurance authorization was denied. Peer to peer between physician and Manufacturing engineer was unsuccessful.  Patient and sister want to appeal again.     Diet Order             Diet Heart Room service appropriate? Yes; Fluid consistency: Thin; Fluid restriction: 1800 mL Fluid  Diet effective now                            Consultants: Cardiologist  Procedures: Cardioversion on 07/17/2023    Medications:    apixaban  5 mg Oral BID   carvedilol  6.25 mg Oral BID WC   Chlorhexidine Gluconate Cloth  6 each Topical Daily   dapagliflozin propanediol  10 mg Oral Daily   digoxin  0.125 mg Oral Daily   finasteride  5 mg Oral QPM   furosemide  60 mg Oral Daily   gabapentin  300 mg Oral BID   iron polysaccharides  150 mg Oral Daily   leptospermum manuka honey  1 Application Topical Daily   pravastatin  20 mg Oral q1800   QUEtiapine  50 mg Oral BID   sacubitril-valsartan  1 tablet Oral BID   silver sulfADIAZINE   Topical Daily   spironolactone  25 mg Oral Daily   tamsulosin  0.4 mg Oral QPC supper   Vitamin D (Ergocalciferol)  50,000 Units Oral Q7 days   Continuous Infusions:   Anti-infectives (From admission, onward)    Start     Dose/Rate Route Frequency Ordered Stop   07/15/23 1800  cephALEXin (KEFLEX) capsule 500 mg  Status:  Discontinued        500  mg Oral Every 6 hours 07/15/23 1429 07/15/23 1430   07/15/23 1600  cephALEXin (KEFLEX) capsule 500 mg        500 mg Oral Every 6 hours 07/15/23 1430 07/16/23 1207   07/11/23 1015  cephALEXin (KEFLEX) capsule 500 mg  Status:  Discontinued        500 mg Oral Every 8 hours 07/11/23 1011 07/15/23 1429              Family Communication/Anticipated D/C date and plan/Code Status   DVT prophylaxis:  apixaban (ELIQUIS) tablet 5 mg     Code Status: Full Code  Family Communication: None Disposition Plan: Plan to discharge   Status is: Inpatient Remains inpatient appropriate  because: Awaiting placement to SNF       Subjective:   Interval events noted.  Objective:    Vitals:   07/30/23 0333 07/30/23 0411 07/30/23 0805 07/30/23 0927  BP: 124/78  113/74   Pulse: 62  60 60  Resp: 17  18   Temp: 98.8 F (37.1 C)  98 F (36.7 C)   TempSrc: Oral  Oral   SpO2: 99%  99%   Weight:  69.6 kg    Height:       No data found.   Intake/Output Summary (Last 24 hours) at 07/30/2023 1522 Last data filed at 07/30/2023 1414 Gross per 24 hour  Intake 240 ml  Output 3350 ml  Net -3110 ml   Filed Weights   07/26/23 0426 07/28/23 0637 07/30/23 0411  Weight: 68.3 kg 69.4 kg 69.6 kg    Exam:  GEN: NAD SKIN: Superficial wound on left hip (patient said this was from hot coffee) EYES: No pallor or icterus ENT: MMM CV: RRR PULM: CTA B ABD: soft, ND, NT, +BS CNS: AAO x 3, non focal EXT: No edema or tenderness        Data Reviewed:   I have personally reviewed following labs and imaging studies:  Labs: Labs show the following:   Basic Metabolic Panel: Recent Labs  Lab 07/24/23 0520 07/26/23 0420 07/27/23 0356 07/28/23 0518 07/29/23 0440 07/30/23 0539  NA 139 134* 137 137 138 137  K 4.5 4.2 4.5 4.5 4.3 4.4  CL 103 100 103 101 101 101  CO2 28 26 28 28 28 29   GLUCOSE 145* 102* 110* 112* 106* 131*  BUN 25* 28* 32* 32* 31* 36*  CREATININE 1.11 1.11 1.23 1.28* 1.11 1.27*  CALCIUM 8.4* 8.3* 8.5* 8.7* 8.5* 8.7*  MG 2.4  --   --   --   --   --    GFR Estimated Creatinine Clearance: 55.4 mL/min (A) (by C-G formula based on SCr of 1.27 mg/dL (H)). Liver Function Tests: No results for input(s): "AST", "ALT", "ALKPHOS", "BILITOT", "PROT", "ALBUMIN" in the last 168 hours. No results for input(s): "LIPASE", "AMYLASE" in the last 168 hours. No results for input(s): "AMMONIA" in the last 168 hours. Coagulation profile No results for input(s): "INR", "PROTIME" in the last 168 hours.  CBC: Recent Labs  Lab 07/26/23 0420 07/27/23 0356  07/28/23 0518 07/29/23 0440 07/30/23 0539  WBC 7.3 7.9 7.4 7.4 7.7  HGB 12.6* 12.3* 12.4* 12.4* 13.1  HCT 37.7* 37.5* 37.2* 36.8* 40.0  MCV 96.4 99.5 97.6 96.6 98.8  PLT 326 313 325 324 327   Cardiac Enzymes: No results for input(s): "CKTOTAL", "CKMB", "CKMBINDEX", "TROPONINI" in the last 168 hours. BNP (last 3 results) No results for input(s): "PROBNP" in the last 8760 hours. CBG:  No results for input(s): "GLUCAP" in the last 168 hours. D-Dimer: No results for input(s): "DDIMER" in the last 72 hours. Hgb A1c: No results for input(s): "HGBA1C" in the last 72 hours. Lipid Profile: No results for input(s): "CHOL", "HDL", "LDLCALC", "TRIG", "CHOLHDL", "LDLDIRECT" in the last 72 hours. Thyroid function studies: No results for input(s): "TSH", "T4TOTAL", "T3FREE", "THYROIDAB" in the last 72 hours.  Invalid input(s): "FREET3" Anemia work up: No results for input(s): "VITAMINB12", "FOLATE", "FERRITIN", "TIBC", "IRON", "RETICCTPCT" in the last 72 hours. Sepsis Labs: Recent Labs  Lab 07/27/23 0356 07/28/23 0518 07/29/23 0440 07/30/23 0539  WBC 7.9 7.4 7.4 7.7    Microbiology No results found for this or any previous visit (from the past 240 hour(s)).  Procedures and diagnostic studies:  No results found.             LOS: 20 days   Kevin [name]  [facility]   Pager on [email or web address]. If 7PM-7AM, please contact night-coverage at [email or web address]     07/30/2023, 3:22 PM

## 2023-07-31 DIAGNOSIS — I5043 Acute on chronic combined systolic (congestive) and diastolic (congestive) heart failure: Secondary | ICD-10-CM | POA: Diagnosis not present

## 2023-07-31 MED ORDER — MEDIHONEY WOUND/BURN DRESSING EX PSTE: 1.0000 | PASTE | Freq: Every day | CUTANEOUS | 0 refills | Status: DC

## 2023-07-31 MED ORDER — APIXABAN 5 MG PO TABS: 5.0000 mg | ORAL_TABLET | Freq: Two times a day (BID) | ORAL | 0 refills | Status: DC

## 2023-07-31 MED ORDER — FUROSEMIDE 40 MG PO TABS: 60.0000 mg | ORAL_TABLET | Freq: Every day | ORAL | 0 refills | Status: DC

## 2023-07-31 MED ORDER — SACUBITRIL-VALSARTAN 49-51 MG PO TABS: 1.0000 | ORAL_TABLET | Freq: Two times a day (BID) | ORAL | 0 refills | Status: DC

## 2023-07-31 MED ORDER — SILVER SULFADIAZINE 1 % EX CREA: TOPICAL_CREAM | Freq: Every day | CUTANEOUS | 0 refills | Status: DC

## 2023-07-31 MED ORDER — DAPAGLIFLOZIN PROPANEDIOL 10 MG PO TABS: 10.0000 mg | ORAL_TABLET | Freq: Every day | ORAL | 0 refills | Status: DC

## 2023-07-31 MED ORDER — DIGOXIN 125 MCG PO TABS: 0.1250 mg | ORAL_TABLET | Freq: Every day | ORAL | 0 refills | Status: DC

## 2023-07-31 MED ORDER — TAMSULOSIN HCL 0.4 MG PO CAPS: 0.4000 mg | ORAL_CAPSULE | Freq: Every day | ORAL | 0 refills | Status: DC

## 2023-07-31 MED ORDER — VITAMIN D 50 MCG (2000 UT) PO TABS: 2000.0000 [IU] | ORAL_TABLET | Freq: Every day | ORAL | 0 refills | Status: DC

## 2023-07-31 NOTE — Progress Notes (Signed)
    Durable Medical Equipment  (From admission, onward)           Start     Ordered   07/31/23 1159  For home use only DME Hospital bed  Once       Question Answer Comment  Length of Need Lifetime   Patient has (list medical condition): CHF   The above medical condition requires: Patient requires the ability to reposition frequently   Head must be elevated greater than: 45 degrees   Bed type Semi-electric   Support Surface: Gel Overlay      07/31/23 1158   07/14/23 0930  For home use only DME Walker rolling  Once       Question Answer Comment  Walker: With 5 Inch Wheels   Patient needs a walker to treat with the following condition Physical debility      07/14/23 0930

## 2023-07-31 NOTE — Progress Notes (Signed)
Discharge instructions were reviewed with patient. IV was taken put and wound care instructions were taught to patient. Family was informed about patient's discharged and transport was arranged.

## 2023-07-31 NOTE — TOC Transition Note (Addendum)
Transition of Care [facility]) - CM/SW Discharge Note   Patient Details  Name: Kevin [name] MRN: [id number] Date of Birth: 04/28/1956  Transition of Care [facility]) CM/SW Contact:  Chapman Fitch, RN Phone Number: 07/31/2023, 2:13 PM   Clinical Narrative:     Notified that patient no longer wishes to appeal insurance for SNF and wants to return home  Met with patient at bedside and he confirms he wants to return home at discharge He is agreeable to home health services. CMS Medicare.gov Compare Post Acute Care list reviewed with patient.  Patient states he does not have a preference of home health agency.  Referral made and accepted by [place] with [facility]. Patient request hospital bed at discharge.  Referral made to Jon with [facility].   Patient agreeable to new PCP appointment.  States he does not have a preference of practice.  New patient appointment made at [facility] family practice for 08/28/23.  Dr Sherryll Burger to sign home health orders in the interim .  Patient states his sister Steward Drone will transport at discharge.  Bedside RN to notify brenda at discharge and request transport    Update:  notified that patients family out of town an unable to transport.  Safe transport arranged.  RN to have patient signed rider waiver and place in patient's chart  Final next level of care: Home w Home Health Services     Patient Goals and CMS Choice      Discharge Placement                         Discharge Plan and Services Additional resources added to the After Visit Summary for                            HH Arranged: RN, PT, OT [facility] Agency: [facility] Date [facility] Agency Contacted: 07/13/23 Time HH Agency Contacted: [phone] Representative spoke with at [facility] Agency: Confirmed with Kandee Keen  Social Determinants of Health (SDOH) Interventions SDOH Screenings   Food Insecurity: No Food Insecurity (07/11/2023)  Housing: Low Risk  (07/14/2023)  Transportation Needs: No Transportation  Needs (07/14/2023)  Recent Concern: Transportation Needs - Unmet Transportation Needs (07/11/2023)  Utilities: Not At Risk (07/11/2023)  Alcohol Screen: Low Risk  (07/14/2023)  Depression (PHQ2-9): Low Risk  (09/18/2021)  Financial Resource Strain: Low Risk  (07/14/2023)  Physical Activity: Insufficiently Active (01/20/2020)  Social Connections: Unknown (01/20/2020)  Stress: No Stress Concern Present (08/04/2019)  Tobacco Use: High Risk (07/11/2023)     Readmission Risk Interventions     No data to display

## 2023-07-31 NOTE — Discharge Summary (Signed)
Physician Discharge Summary   Patient: Kevin [name] MRN: [id number] DOB: 12-27-1955  Admit date:     07/10/2023  Discharge date: 07/31/23  Discharge Physician: Lurene Shadow   PCP: Pcp, No   Recommendations at discharge:   Follow-up with PCP in 1 week Follow-up with the CHF clinic in 1 week  Discharge Diagnoses: Principal Problem:   Acute on chronic combined systolic and diastolic CHF (congestive heart failure) (HCC) Active Problems:   Hypokalemia   Peripheral edema   Essential hypertension   Peripheral neuropathy   Dyslipidemia   Transaminitis   Noncompliance with medication regimen   Acute on chronic congestive heart failure (HCC)  Resolved Problems:   * No resolved hospital problems. *  Hospital Course:  Kevin [name] is a 67 y.o. male with medical history significant for chronic combined systolic and diastolic CHF, COPD, type II DM, hypertension, who presented to the hospital because of worsening shortness of breath, orthopnea, paroxysmal nocturnal dyspnea and bilateral leg and feet swelling associated with formation of blisters and drainage on the legs.     He was admitted to the hospital for acute exacerbation of chronic systolic and diastolic CHF.    Assessment and Plan:   Acute on chronic systolic and diastolic CHF, nonischemic cardiomyopathy: Continue Lasix, carvedilol, Entresto, spironolactone, digoxin and Farxiga. 2D echo on 07/12/2023 showed EF estimated at 20 to 25%, moderate LVH, moderately reduced RV function, indeterminate LV diastolic parameters, moderate to severe tricuspid regurgitation Cardiac MRI showed severely reduced RV function and LV systolic function but no evidence of myocardial infiltrative disease.     Atrial flutter/fibrillation with RVR: S/p cardioversion on 07/17/2023.  Continue Eliquis     Acute urinary retention: Resolved.  Foley catheter was successfully removed.  Continue Flomax at discharge.      Macrocytic anemia: Improved.  H&H  stable.     Left hip wound (unroofed blister wound): Continue local wound care     Vitamin D deficiency: He received 3 doses of high-dose vitamin D in the hospital.  Continue low-dose vitamin D supplement at discharge.   Comorbidities include COPD, vitamin D deficiency, hyperlipidemia, peripheral neuropathy, hypertension    Patient said he no longer wants to pursue rehab.  He prefers to go home today.  His condition has improved and he is deemed stable for discharge to home.  Discharge plan was discussed with Steward Drone, sister, over the phone.      Consultants: Cardiologist Procedures performed: TEE and cardioversion on 07/17/2023 Disposition: Home Diet recommendation:  Discharge Diet Orders (From admission, onward)     Start     Ordered   07/31/23 0000  Diet - low sodium heart healthy        07/31/23 1135           Cardiac diet DISCHARGE MEDICATION: Allergies as of 07/31/2023   No Known Allergies      Medication List     STOP taking these medications    amLODipine 10 MG tablet Commonly known as: NORVASC   sacubitril-valsartan 97-103 MG Commonly known as: ENTRESTO Replaced by: sacubitril-valsartan 49-51 MG   tiotropium 18 MCG inhalation capsule Commonly known as: SPIRIVA       TAKE these medications    albuterol 108 (90 Base) MCG/ACT inhaler Commonly known as: VENTOLIN HFA Inhale 2 puffs into the lungs every 6 (six) hours as needed for wheezing or shortness of breath.   apixaban 5 MG Tabs tablet Commonly known as: ELIQUIS Take 1 tablet (5 mg total) by mouth  2 (two) times daily.   Blood Pressure Kit 1 kit by Does not apply route daily.   carvedilol 25 MG tablet Commonly known as: COREG TAKE ONE TABLET BY MOUTH 2 TIMES A DAY   dapagliflozin propanediol 10 MG Tabs tablet Commonly known as: FARXIGA Take 1 tablet (10 mg total) by mouth daily. Start taking on: August 01, 2023   digoxin 0.125 MG tablet Commonly known as: LANOXIN Take 1 tablet  (0.125 mg total) by mouth daily. Start taking on: August 01, 2023   furosemide 40 MG tablet Commonly known as: LASIX Take 1.5 tablets (60 mg total) by mouth daily. What changed: how much to take   gabapentin 300 MG capsule Commonly known as: NEURONTIN Take 300 mg by mouth 2 (two) times daily.   leptospermum manuka honey Pste paste Apply 1 Application topically daily. Start taking on: August 01, 2023   oxyCODONE-acetaminophen 5-325 MG tablet Commonly known as: Percocet Take 1 tablet by mouth every 4 (four) hours as needed for severe pain.   pravastatin 20 MG tablet Commonly known as: PRAVACHOL TAKE ONE TABLET BY MOUTH EVERY DAY   sacubitril-valsartan 49-51 MG Commonly known as: ENTRESTO Take 1 tablet by mouth 2 (two) times daily. Replaces: sacubitril-valsartan 97-103 MG   silver sulfADIAZINE 1 % cream Commonly known as: SILVADENE Apply topically daily. Start taking on: August 01, 2023   spironolactone 25 MG tablet Commonly known as: ALDACTONE TAKE ONE TABLET BY MOUTH EVERY DAY   tamsulosin 0.4 MG Caps capsule Commonly known as: FLOMAX Take 1 capsule (0.4 mg total) by mouth daily after supper.   Vitamin D 50 MCG (2000 UT) tablet Take 1 tablet (2,000 Units total) by mouth daily.               Durable Medical Equipment  (From admission, onward)           Start     Ordered   07/14/23 0930  For home use only DME Walker rolling  Once       Question Answer Comment  Walker: With 5 Inch Wheels   Patient needs a walker to treat with the following condition Physical debility      07/14/23 0930              Discharge Care Instructions  (From admission, onward)           Start     Ordered   07/31/23 0000  Discharge wound care:       Comments: Wound care  Daily      Comments: Clean L hip wound with NS, apply Silvadene cream to wounds daily, cover with Telfa non-stick pad and silicone foam or ABD pad whichever preferred.  Make sure to clean off  all old Silvadene before applying new    Wound care  Daily      Comments: Cleanse B feet and L lower posterior leg wounds with Vashe wound cleanser Hart [place] [phone]), apply Medihoney to wound beds daily, cover with dry gauze, ABD pads, Kerlix roll gauze and tape   07/31/23 1135            Contact information for follow-up providers     [facility]. Go in 7 day(s).   Specialty: Cardiology Why: Hospital Follow-Up-08/04/23 @ 11:30 Pt will need transportation set up. [facility], [address], [address] Floor Please bring all medications to appointment Contact information: [address] [place] [address] [phone]  PCP Follow up in 1 week(s).          Stoioff, Verna Czech, MD Follow up in 1 week(s).   Specialty: Urology Why: For voiding trial Contact information: [address] [place] [address] [phone]              Contact information for after-discharge care     Destination     [facility] Preferred SNF .   Service: Skilled Nursing Contact information: [address] [place] [address] [phone]                    Discharge Exam: Ceasar Mons Weights   07/28/23 4034 07/30/23 0411 07/31/23 0437  Weight: 69.4 kg 69.6 kg 70.1 kg   GEN: NAD SKIN: Warm and dry EYES: No pallor or icterus ENT: MMM CV: RRR PULM: CTA B ABD: soft, ND, NT, +BS CNS: AAO x 3, non focal EXT: No edema or tenderness   Condition at discharge: good  The results of significant diagnostics from this hospitalization (including imaging, microbiology, ancillary and laboratory) are listed below for reference.   Imaging Studies: MR CARDIAC MORPHOLOGY W WO CONTRAST  Result Date: 07/18/2023 CLINICAL DATA:  NICM, eval for amyloidosis EXAM: CARDIAC MRI TECHNIQUE: The patient was scanned on a 1.5 Tesla Siemens magnet. A dedicated  cardiac coil was used. Functional imaging was done using Fiesta sequences. 2,3, and 4 chamber views were done to assess for RWMA's. Modified Simpson's rule using a short axis stack was used to calculate an ejection fraction on a dedicated work [employer]. The patient received 10 cc of Gadavist. After 10 minutes inversion recovery sequences were used to assess for infiltration and scar tissue. Velocity flow mapping performed in the ascending aorta and main pulmonary artery. CONTRAST:  10 cc  of Gadavist FINDINGS: 1. Mildly dilated left ventricular size, normal thickness, severely reduced systolic function (LVEF = 26%). There is global hypokinesis. There is a thin, midwall, basal septal LGE  . LVEDV: 242 ml LVESV: 180 ml SV: 62 ml CO: 4.3 L/min Myocardial mass: 172g LV native T1 value 1037 ms, (normal <1000 ms). LV ECV value 33 %.  (Normal <30%) 2. Normal right ventricular size and thickness. Severely reduced systolic function (RVEF = 28%). There are no regional wall motion abnormalities. 3.  Mildly dilated left and right atrial size. 4. Normal size of the aortic root, ascending aorta and pulmonary artery. 5. Trivial mitral regurgitation, no significant valvular abnormalities. 6.  Normal pericardium.  No pericardial effusion. IMPRESSION: 1. Mildly dilated LV size, severely reduced LV systolic function. LVEF 26%. 2. There is a thin, midwall, basal septal LGE. 3. Severely reduced RV function. 4. No significant valvular abnormalities. 5. No evidence for myocardial infiltrative disease. 6. Findings consistent with dilated nonischemic cardiomyopathy. Electronically Signed   By: Debbe Odea M.D.   On: 07/18/2023 14:24   MR CARDIAC VELOCITY FLOW MAP  Result Date: 07/18/2023 CLINICAL DATA:  NICM, eval for amyloidosis EXAM: CARDIAC MRI TECHNIQUE: The patient was scanned on a 1.5 Tesla Siemens magnet. A dedicated cardiac coil was used. Functional imaging was done using Fiesta sequences. 2,3, and 4  chamber views were done to assess for RWMA's. Modified Simpson's rule using a short axis stack was used to calculate an ejection fraction on a dedicated work [employer] officer, [employer]. The patient received 10 cc of Gadavist. After 10 minutes inversion recovery sequences were used to assess for infiltration and scar  tissue. Velocity flow mapping performed in the ascending aorta and main pulmonary artery. CONTRAST:  10 cc  of Gadavist FINDINGS: 1. Mildly dilated left ventricular size, normal thickness, severely reduced systolic function (LVEF = 26%). There is global hypokinesis. There is a thin, midwall, basal septal LGE  . LVEDV: 242 ml LVESV: 180 ml SV: 62 ml CO: 4.3 L/min Myocardial mass: 172g LV native T1 value 1037 ms, (normal <1000 ms). LV ECV value 33 %.  (Normal <30%) 2. Normal right ventricular size and thickness. Severely reduced systolic function (RVEF = 28%). There are no regional wall motion abnormalities. 3.  Mildly dilated left and right atrial size. 4. Normal size of the aortic root, ascending aorta and pulmonary artery. 5. Trivial mitral regurgitation, no significant valvular abnormalities. 6.  Normal pericardium.  No pericardial effusion. IMPRESSION: 1. Mildly dilated LV size, severely reduced LV systolic function. LVEF 26%. 2. There is a thin, midwall, basal septal LGE. 3. Severely reduced RV function. 4. No significant valvular abnormalities. 5. No evidence for myocardial infiltrative disease. 6. Findings consistent with dilated nonischemic cardiomyopathy. Electronically Signed   By: Debbe Odea M.D.   On: 07/18/2023 14:24   MR CARDIAC VELOCITY FLOW MAP  Result Date: 07/18/2023 CLINICAL DATA:  NICM, eval for amyloidosis EXAM: CARDIAC MRI TECHNIQUE: The patient was scanned on a 1.5 Tesla Siemens magnet. A dedicated cardiac coil was used. Functional imaging was done using Fiesta sequences. 2,3, and 4 chamber views were done to assess for RWMA's. Modified Simpson's rule using a short  axis stack was used to calculate an ejection fraction on a dedicated work [employer] officer, [employer]. The patient received 10 cc of Gadavist. After 10 minutes inversion recovery sequences were used to assess for infiltration and scar tissue. Velocity flow mapping performed in the ascending aorta and main pulmonary artery. CONTRAST:  10 cc  of Gadavist FINDINGS: 1. Mildly dilated left ventricular size, normal thickness, severely reduced systolic function (LVEF = 26%). There is global hypokinesis. There is a thin, midwall, basal septal LGE  . LVEDV: 242 ml LVESV: 180 ml SV: 62 ml CO: 4.3 L/min Myocardial mass: 172g LV native T1 value 1037 ms, (normal <1000 ms). LV ECV value 33 %.  (Normal <30%) 2. Normal right ventricular size and thickness. Severely reduced systolic function (RVEF = 28%). There are no regional wall motion abnormalities. 3.  Mildly dilated left and right atrial size. 4. Normal size of the aortic root, ascending aorta and pulmonary artery. 5. Trivial mitral regurgitation, no significant valvular abnormalities. 6.  Normal pericardium.  No pericardial effusion. IMPRESSION: 1. Mildly dilated LV size, severely reduced LV systolic function. LVEF 26%. 2. There is a thin, midwall, basal septal LGE. 3. Severely reduced RV function. 4. No significant valvular abnormalities. 5. No evidence for myocardial infiltrative disease. 6. Findings consistent with dilated nonischemic cardiomyopathy. Electronically Signed   By: Debbe Odea M.D.   On: 07/18/2023 14:24   ECHOCARDIOGRAM COMPLETE  Result Date: 07/12/2023    ECHOCARDIOGRAM REPORT   Patient Name:   Kevin [name] Date of Exam: 07/12/2023 Medical Rec #:  [id number]   Height:       68.0 in Accession #:    [id number]  Weight:       174.0 lb Date of Birth:  [birth year]    BSA:          1.926 m Patient Age:    67 years    BP:           118/79  mmHg Patient Gender: M           HR:           90 bpm. Exam Location:  [facility] Procedure: 2D Echo, 3D Echo and Strain  Analysis Indications:     CHF I50.21  History:         Patient has prior history of Echocardiogram examinations, most                  recent 06/12/2020.  Sonographer:     Overton Mam RDCS, FASE Referring Phys:  [id number] Vernetta Honey MANSY Diagnosing Phys: Julien Nordmann MD  Sonographer Comments: Global longitudinal strain was attempted. IMPRESSIONS  1. Left ventricular ejection fraction, by estimation, is 20 to 25%. Left ventricular ejection fraction by 3D volume is 20 %. The left ventricle has severely decreased function. The left ventricle has no regional wall motion abnormalities. There is moderate left ventricular hypertrophy. Left ventricular diastolic parameters are indeterminate. The average left ventricular global longitudinal strain is -6.3 %.  2. Right ventricular systolic function is moderately reduced. The right ventricular size is normal. There is normal pulmonary artery systolic pressure. The estimated right ventricular systolic pressure is 35.9 mmHg.  3. Left atrial size was moderately dilated.  4. Right atrial size was moderately dilated.  5. The mitral valve is normal in structure. Mild mitral valve regurgitation. No evidence of mitral stenosis.  6. Tricuspid valve regurgitation is moderate to severe.  7. The aortic valve is tricuspid. Aortic valve regurgitation is not visualized. No aortic stenosis is present.  8. Severely dilated pulmonary artery.  9. The inferior vena cava is normal in size with greater than 50% respiratory variability, suggesting right atrial pressure of 3 mmHg. FINDINGS  Left Ventricle: Left ventricular ejection fraction, by estimation, is 20 to 25%. Left ventricular ejection fraction by 3D volume is 20 %. The left ventricle has severely decreased function. The left ventricle has no regional wall motion abnormalities. The average left ventricular global longitudinal strain is -6.3 %. The left ventricular internal cavity size was normal in size. There is moderate left ventricular  hypertrophy. Left ventricular diastolic parameters are indeterminate. Right Ventricle: The right ventricular size is normal. No increase in right ventricular wall thickness. Right ventricular systolic function is moderately reduced. There is normal pulmonary artery systolic pressure. The tricuspid regurgitant velocity is 2.78 m/s, and with an assumed right atrial pressure of 5 mmHg, the estimated right ventricular systolic pressure is 35.9 mmHg. Left Atrium: Left atrial size was moderately dilated. Right Atrium: Right atrial size was moderately dilated. Pericardium: There is no evidence of pericardial effusion. Mitral Valve: The mitral valve is normal in structure. Mild mitral valve regurgitation. No evidence of mitral valve stenosis. Tricuspid Valve: The tricuspid valve is normal in structure. Tricuspid valve regurgitation is moderate to severe. No evidence of tricuspid stenosis. Aortic Valve: The aortic valve is tricuspid. Aortic valve regurgitation is not visualized. No aortic stenosis is present. Aortic valve peak gradient measures 5.5 mmHg. Pulmonic Valve: The pulmonic valve was normal in structure. Pulmonic valve regurgitation is not visualized. No evidence of pulmonic stenosis. Aorta: The aortic root is normal in size and structure. Pulmonary Artery: The pulmonary artery is severely dilated. Venous: The inferior vena cava is normal in size with greater than 50% respiratory variability, suggesting right atrial pressure of 3 mmHg. IAS/Shunts: No atrial level shunt detected by color flow Doppler.  LEFT VENTRICLE PLAX 2D LVIDd:         5.40 cm  Diastology LVIDs:         4.80 cm         LV e' medial:    5.11 cm/s LV PW:         1.30 cm         LV E/e' medial:  16.8 LV IVS:        1.40 cm         LV e' lateral:   7.94 cm/s LVOT diam:     2.00 cm         LV E/e' lateral: 10.8 LV SV:         34 LV SV Index:   18              2D LVOT Area:     3.14 cm        Longitudinal                                Strain                                 2D Strain GLS  -6.3 % LV Volumes (MOD)               Avg: LV vol d, MOD    148.0 ml A2C:                           3D Volume EF LV vol d, MOD    106.0 ml      LV 3D EF:    Left A4C:                                        ventricul LV vol s, MOD    127.0 ml                   ar A2C:                                        ejection LV vol s, MOD    79.3 ml                    fraction A4C:                                        by 3D LV SV MOD A2C:   21.0 ml                    volume is LV SV MOD A4C:   106.0 ml                   20 %. LV SV MOD BP:    25.4 ml                                 3D Volume EF:  3D EF:        20 %                                LV EDV:       188 ml                                LV ESV:       151 ml                                LV SV:        37 ml RIGHT VENTRICLE RV Basal diam:  3.80 cm RV S prime:     12.10 cm/s TAPSE (M-mode): 1.4 cm LEFT ATRIUM             Index        RIGHT ATRIUM           Index LA diam:        4.20 cm 2.18 cm/m   RA Area:     32.70 cm LA Vol (A2C):   98.7 ml 51.24 ml/m  RA Volume:   125.00 ml 64.89 ml/m LA Vol (A4C):   52.5 ml 27.25 ml/m LA Biplane Vol: 75.0 ml 38.93 ml/m  AORTIC VALVE                 PULMONIC VALVE AV Area (Vmax): 2.08 cm     PV Vmax:       0.74 m/s AV Vmax:        117.00 cm/s  PV Peak grad:  2.2 mmHg AV Peak Grad:   5.5 mmHg LVOT Vmax:      77.40 cm/s LVOT Vmean:     51.500 cm/s LVOT VTI:       0.109 m                              PULMONARY ARTERY AORTA                        MPA diam:        2.90 cm Ao Root diam: 3.50 cm Ao Asc diam:  3.20 cm MITRAL VALVE               TRICUSPID VALVE MV Area (PHT): 4.17 cm    TR Peak grad:   30.9 mmHg MV Decel Time: 182 msec    TR Vmax:        278.00 cm/s MV E velocity: 85.90 cm/s                            SHUNTS                            Systemic VTI:  0.11 m                            Systemic Diam: 2.00 cm Julien Nordmann MD Electronically  signed by Julien Nordmann MD Signature Date/Time: 07/12/2023/4:04:37 PM    Final    DG Chest 2 View  Result Date: 07/10/2023 CLINICAL DATA:  Bilateral feet swelling  for the past couple weeks. EXAM: CHEST - 2 VIEW COMPARISON:  Radiograph 06/12/2020 FINDINGS: Stable cardiomegaly. Aortic atherosclerotic calcification. No focal consolidation, pleural effusion, or pneumothorax. No displaced rib fractures. IMPRESSION: No acute cardiopulmonary disease.  Cardiomegaly. Electronically Signed   By: Minerva Fester M.D.   On: 07/10/2023 23:27    Microbiology: Results for orders placed or performed during the hospital encounter of 06/12/20  Respiratory Panel by RT PCR (Flu A&B, Covid) - Nasopharyngeal Swab     Status: None   Collection Time: 06/12/20  4:09 AM   Specimen: Nasopharyngeal Swab  Result Value Ref Range Status   SARS Coronavirus 2 by RT PCR NEGATIVE NEGATIVE Final    Comment: (NOTE) SARS-CoV-2 target nucleic acids are NOT DETECTED.  The SARS-CoV-2 RNA is generally detectable in upper respiratoy specimens during the acute phase of infection. The lowest concentration of SARS-CoV-2 viral copies this assay can detect is 131 copies/mL. A negative result does not preclude SARS-Cov-2 infection and should not be used as the sole basis for treatment or other patient management decisions. A negative result may occur with  improper specimen collection/handling, submission of specimen other than nasopharyngeal swab, presence of viral mutation(s) within the areas targeted by this assay, and inadequate number of viral copies (<131 copies/mL). A negative result must be combined with clinical observations, patient history, and epidemiological information. The expected result is Negative.  Fact Sheet for Patients:  https://www.moore.com/  Fact Sheet for Healthcare Providers:  https://www.young.biz/  This test is no t yet approved or cleared by the [place]  FDA and  has been authorized for detection and/or diagnosis of SARS-CoV-2 by FDA under an Emergency Use Authorization (EUA). This EUA will remain  in effect (meaning this test can be used) for the duration of the COVID-19 declaration under Section 564(b)(1) of the Act, 21 U.S.C. section 360bbb-3(b)(1), unless the authorization is terminated or revoked sooner.     Influenza A by PCR NEGATIVE NEGATIVE Final   Influenza B by PCR NEGATIVE NEGATIVE Final    Comment: (NOTE) The Xpert Xpress SARS-CoV-2/FLU/RSV assay is intended as an aid in  the diagnosis of influenza from Nasopharyngeal swab specimens and  should not be used as a sole basis for treatment. Nasal washings and  aspirates are unacceptable for Xpert Xpress SARS-CoV-2/FLU/RSV  testing.  Fact Sheet for Patients: https://www.moore.com/  Fact Sheet for Healthcare Providers: https://www.young.biz/  This test is not yet approved or cleared by the [place] FDA and  has been authorized for detection and/or diagnosis of SARS-CoV-2 by  FDA under an Emergency Use Authorization (EUA). This EUA will remain  in effect (meaning this test can be used) for the duration of the  Covid-19 declaration under Section 564(b)(1) of the Act, 21  U.S.C. section 360bbb-3(b)(1), unless the authorization is  terminated or revoked. Performed at [facility], [address] [place], [place] [address]     Labs: CBC: Recent Labs  Lab 07/26/23 0420 07/27/23 0356 07/28/23 0518 07/29/23 0440 07/30/23 0539  WBC 7.3 7.9 7.4 7.4 7.7  HGB 12.6* 12.3* 12.4* 12.4* 13.1  HCT 37.7* 37.5* 37.2* 36.8* 40.0  MCV 96.4 99.5 97.6 96.6 98.8  PLT 326 313 325 324 327   Basic Metabolic Panel: Recent Labs  Lab 07/26/23 0420 07/27/23 0356 07/28/23 0518 07/29/23 0440 07/30/23 0539  NA 134* 137 137 138 137  K 4.2 4.5 4.5 4.3 4.4  CL 100 103 101 101 101  CO2 26 28 28 28 29   GLUCOSE 102*  110* 112* 106*  131*  BUN 28* 32* 32* 31* 36*  CREATININE 1.11 1.23 1.28* 1.11 1.27*  CALCIUM 8.3* 8.5* 8.7* 8.5* 8.7*   Liver Function Tests: No results for input(s): "AST", "ALT", "ALKPHOS", "BILITOT", "PROT", "ALBUMIN" in the last 168 hours. CBG: No results for input(s): "GLUCAP" in the last 168 hours.  Discharge time spent: greater than 30 minutes.  Signed: Lurene Shadow, MD [facility] 07/31/2023

## 2023-07-31 NOTE — Care Management Important Message (Signed)
Important Message  Patient Details  Name: Kevin [name] MRN: [id number] Date of Birth: 25-Aug-1956   Important Message Given:  Yes - Medicare IM     Verita Schneiders Elenora Hawbaker 07/31/2023, 2:43 PM

## 2023-08-01 ENCOUNTER — Telehealth: Payer: Self-pay | Admitting: Family

## 2023-08-01 ENCOUNTER — Encounter: Payer: Medicare HMO | Admitting: Family

## 2023-08-01 DIAGNOSIS — I5042 Chronic combined systolic (congestive) and diastolic (congestive) heart failure: Secondary | ICD-10-CM

## 2023-08-01 NOTE — Telephone Encounter (Signed)
Called pt number and someone said wrong number will update contact when pt comes in

## 2023-08-01 NOTE — Progress Notes (Unsigned)
Advanced Heart Failure Clinic Note   Referring Physician: PCP: Pcp, No Cardiologist: Debbe Odea, MD   HPI:   Kevin [name] is a 67 y/o male with a history of HTN, COPD, tobacco use and chronic heart failure.   Echo report from 01/13/20 reviewed and showed an EF of 50-55% along with moderate LVH. Echo report from 08/04/2019 reviewed and showed an EF of 25-30% along with mild/moderate TR.   Has not been admitted or been in the ED in the last 6 months.   He presents today for a follow-up visit with no complaints.  Denies any dizziness, headaches, cough, SOB, chest pain/pressure, palpitations, abdominal distention, constipation, diarrhea, difficulty sleeping, nor pedal edema.   He weighs himself once a week. Follows a low-sodium diet and drinks mostly water.     Review of Systems: [y] = yes, [ ]  = no   General: Weight gain [ ] ; Weight loss [ ] ; Anorexia [ ] ; Fatigue [ ] ; Fever [ ] ; Chills [ ] ; Weakness [ ]   Cardiac: Chest pain/pressure [ ] ; Resting SOB [ ] ; Exertional SOB [ ] ; Orthopnea [ ] ; Pedal Edema [ ] ; Palpitations [ ] ; Syncope [ ] ; Presyncope [ ] ; Paroxysmal nocturnal dyspnea[ ]   Pulmonary: Cough [ ] ; Wheezing[ ] ; Hemoptysis[ ] ; Sputum [ ] ; Snoring [ ]   GI: Vomiting[ ] ; Dysphagia[ ] ; Melena[ ] ; Hematochezia [ ] ; Heartburn[ ] ; Abdominal pain [ ] ; Constipation [ ] ; Diarrhea [ ] ; BRBPR [ ]   GU: Hematuria[ ] ; Dysuria [ ] ; Nocturia[ ]   Vascular: Pain in legs with walking [ ] ; Pain in feet with lying flat [ ] ; Non-healing sores [ ] ; Stroke [ ] ; TIA [ ] ; Slurred speech [ ] ;  Neuro: Headaches[ ] ; Vertigo[ ] ; Seizures[ ] ; Paresthesias[ ] ;Blurred vision [ ] ; Diplopia [ ] ; Vision changes [ ]   Ortho/Skin: Arthritis [ ] ; Joint pain [ ] ; Muscle pain [ ] ; Joint swelling [ ] ; Back Pain [ ] ; Rash [ ]   Psych: Depression[ ] ; Anxiety[ ]   Heme: Bleeding problems [ ] ; Clotting disorders [ ] ; Anemia [ ]   Endocrine: Diabetes [ ] ; Thyroid dysfunction[ ]    Past Medical History:  Diagnosis Date    COPD (chronic obstructive pulmonary disease) (HCC)    Diabetes mellitus without complication (HCC)    Heart failure with mid-range ejection fraction (HCC)    a. 2019 reported neg stress test and nl cors on cath in [facility]; b. 07/2019 Echo: EF 25-30%, Gr2 DD, glob HK. Mod reduced RV fxn w/ volume overload. Mod dil RA. Mild to mod TR. Mod elev PASP; b. 12/2019 Echo: EF 50-55%; c. 05/2020 Echo: EF 45-50%, glob HK, sev LVH, GrII DD, nl RV size/fxn, mildly BAE, mild AoV sclerosis, mildly dilated Ao root.   Hypertension    NICM (nonischemic cardiomyopathy) (HCC)    Noncompliance    Pleural effusion on right    a. 07/2019 Thoracentesis: 300 ml.   Tobacco abuse     Current Outpatient Medications  Medication Sig Dispense Refill   albuterol (VENTOLIN HFA) 108 (90 Base) MCG/ACT inhaler Inhale 2 puffs into the lungs every 6 (six) hours as needed for wheezing or shortness of breath. 18 g 3   apixaban (ELIQUIS) 5 MG TABS tablet Take 1 tablet (5 mg total) by mouth 2 (two) times daily. 60 tablet 0   Blood Pressure KIT 1 kit by Does not apply route daily. 1 kit 0   carvedilol (COREG) 25 MG tablet TAKE ONE TABLET BY MOUTH 2 TIMES A DAY 180 tablet  3   Cholecalciferol (VITAMIN D) 50 MCG (2000 UT) tablet Take 1 tablet (2,000 Units total) by mouth daily. 30 tablet 0   dapagliflozin propanediol (FARXIGA) 10 MG TABS tablet Take 1 tablet (10 mg total) by mouth daily. 30 tablet 0   digoxin (LANOXIN) 0.125 MG tablet Take 1 tablet (0.125 mg total) by mouth daily. 30 tablet 0   furosemide (LASIX) 40 MG tablet Take 1.5 tablets (60 mg total) by mouth daily. 60 tablet 0   gabapentin (NEURONTIN) 300 MG capsule Take 300 mg by mouth 2 (two) times daily.     leptospermum manuka honey (MEDIHONEY) PSTE paste Apply 1 Application topically daily. 15 mL 0   oxyCODONE-acetaminophen (PERCOCET) 5-325 MG tablet Take 1 tablet by mouth every 4 (four) hours as needed for severe pain. 8 tablet 0   pravastatin (PRAVACHOL) 20 MG tablet TAKE  ONE TABLET BY MOUTH EVERY DAY 90 tablet 3   sacubitril-valsartan (ENTRESTO) 49-51 MG Take 1 tablet by mouth 2 (two) times daily. 60 tablet 0   silver sulfADIAZINE (SILVADENE) 1 % cream Apply topically daily. 50 g 0   spironolactone (ALDACTONE) 25 MG tablet TAKE ONE TABLET BY MOUTH EVERY DAY 90 tablet 3   tamsulosin (FLOMAX) 0.4 MG CAPS capsule Take 1 capsule (0.4 mg total) by mouth daily after supper. 30 capsule 0   No current facility-administered medications for this visit.    No Known Allergies    Social History   Socioeconomic History   Marital status: Single    Spouse name: Not on file   Number of children: 2   Years of education: Not on file   Highest education level: High school graduate  Occupational History   Occupation: unemployed  Tobacco Use   Smoking status: Every Day    Current packs/day: 0.50    Average packs/day: 0.3 packs/day for 24.9 years (7.5 ttl pk-yrs)    Types: Cigarettes    Start date: 2020   Smokeless tobacco: Never   Tobacco comments:    a pack lasts a week (appx. 4 days)  Vaping Use   Vaping status: Never Used  Substance and Sexual Activity   Alcohol use: Never   Drug use: Not Currently   Sexual activity: Not Currently  Other Topics Concern   Not on file  Social History Narrative   Patient moved to [place] from [place] in March 2020, to take care of his Mother. She lives in a community managed by her church, "independent living for seniors." Patient lives with his Mother, does not drive, and reports he mostly keeps to himself.   Social Determinants of Health   Financial Resource Strain: Low Risk  (07/14/2023)   Overall Financial Resource Strain (CARDIA)    Difficulty of Paying Living Expenses: Not hard at all  Food Insecurity: No Food Insecurity (07/11/2023)   Hunger Vital Sign    Worried About Running Out of Food in the Last Year: Never true    Ran Out of Food in the Last Year: Never true  Transportation Needs: No Transportation  Needs (07/14/2023)   PRAPARE - Administrator, Civil Service (Medical): No    Lack of Transportation (Non-Medical): No  Recent Concern: Transportation Needs - Unmet Transportation Needs (07/11/2023)   PRAPARE - Transportation    Lack of Transportation (Medical): Yes    Lack of Transportation (Non-Medical): Yes  Physical Activity: Insufficiently Active (01/20/2020)   Exercise Vital Sign    Days of Exercise per Week: 2 days  Minutes of Exercise per Session: 30 min  Stress: No Stress Concern Present (08/04/2019)   [employer] of Occupational Health - Occupational Stress Questionnaire    Feeling of Stress : Not at all  Social Connections: Unknown (01/20/2020)   Social Connection and Isolation Panel [NHANES]    Frequency of Communication with Friends and Family: Once a week    Frequency of Social Gatherings with Friends and Family: Once a week    Attends Religious Services: 1 to 4 times per year    Active Member of [employer] or Organizations: Yes    Attends Banker Meetings: Never    Marital Status: Not on file  Intimate Partner Violence: Not At Risk (07/11/2023)   Humiliation, Afraid, Rape, and Kick questionnaire    Fear of Current or Ex-Partner: No    Emotionally Abused: No    Physically Abused: No    Sexually Abused: No     No family history on file.     PHYSICAL EXAM: General:  Well appearing. No respiratory difficulty HEENT: normal Neck: supple. no JVD. Carotids 2+ bilat; no bruits. No lymphadenopathy or thyromegaly appreciated. Cor: PMI nondisplaced. Regular rate & rhythm. No rubs, gallops or murmurs. Lungs: clear Abdomen: soft, nontender, nondistended. No hepatosplenomegaly. No bruits or masses. Good bowel sounds. Extremities: no cyanosis, clubbing, rash, edema Neuro: alert & oriented x 3, cranial nerves grossly intact. moves all 4 extremities w/o difficulty. Affect pleasant.  ECG:   ASSESSMENT & PLAN:  1: Chronic heart failure with  preserved ejection fraction along with structural changes- - NYHA class I - euvolemic today - not weighing daily and encouraged to resume; reminded to call for an overnight weight gain of >2 pounds or a weekly weight gain of >5 pounds - weight down 5 pounds from last visit here 3 months ago - not adding salt and has been trying to read food labels; reviewed the importance of closely following a 2000mg  sodium diet  - saw cardiology (Agbor-Etang) 01/14/20 - echo needs updated so order placed  - BNP 06/12/20 was 620.0  2: HTN- - BP mildly elevated (137/99)  - saw PCP at [facility] 08/30/20 - BMP on 10/04/20 reviewed and showed sodium 141, potassium 3.8, creatinine 0.98 and GFR 94 - Ordered BMP today   3: COPD-  - using nebulizer and inhalers - smoking 2 cigarettes daily   Medication bottles reviewed.   Return in 3 months, sooner if needed.     Delma Freeze, FNP 08/01/23

## 2023-08-04 ENCOUNTER — Ambulatory Visit: Payer: Medicare HMO | Attending: Family | Admitting: Family

## 2023-08-04 ENCOUNTER — Encounter: Payer: Self-pay | Admitting: Family

## 2023-08-04 VITALS — BP 135/92 | HR 78 | Wt 156.0 lb

## 2023-08-04 DIAGNOSIS — E119 Type 2 diabetes mellitus without complications: Secondary | ICD-10-CM | POA: Insufficient documentation

## 2023-08-04 DIAGNOSIS — I110 Hypertensive heart disease with heart failure: Secondary | ICD-10-CM | POA: Diagnosis not present

## 2023-08-04 DIAGNOSIS — I480 Paroxysmal atrial fibrillation: Secondary | ICD-10-CM | POA: Insufficient documentation

## 2023-08-04 DIAGNOSIS — Z79899 Other long term (current) drug therapy: Secondary | ICD-10-CM | POA: Diagnosis not present

## 2023-08-04 DIAGNOSIS — I10 Essential (primary) hypertension: Secondary | ICD-10-CM | POA: Diagnosis not present

## 2023-08-04 DIAGNOSIS — Z7984 Long term (current) use of oral hypoglycemic drugs: Secondary | ICD-10-CM | POA: Diagnosis not present

## 2023-08-04 DIAGNOSIS — I5022 Chronic systolic (congestive) heart failure: Secondary | ICD-10-CM | POA: Diagnosis not present

## 2023-08-04 DIAGNOSIS — R0602 Shortness of breath: Secondary | ICD-10-CM | POA: Insufficient documentation

## 2023-08-04 DIAGNOSIS — E785 Hyperlipidemia, unspecified: Secondary | ICD-10-CM | POA: Diagnosis not present

## 2023-08-04 DIAGNOSIS — F17210 Nicotine dependence, cigarettes, uncomplicated: Secondary | ICD-10-CM | POA: Insufficient documentation

## 2023-08-04 DIAGNOSIS — J449 Chronic obstructive pulmonary disease, unspecified: Secondary | ICD-10-CM | POA: Diagnosis not present

## 2023-08-04 DIAGNOSIS — I5042 Chronic combined systolic (congestive) and diastolic (congestive) heart failure: Secondary | ICD-10-CM | POA: Diagnosis not present

## 2023-08-04 DIAGNOSIS — Z7901 Long term (current) use of anticoagulants: Secondary | ICD-10-CM | POA: Diagnosis not present

## 2023-08-04 NOTE — Patient Instructions (Signed)
 Go DOWN to LOWER LEVEL (LL) to have your blood work completed inside of [employer] office.  We will only call you if the results are abnormal or if the provider would like to make medication changes.

## 2023-08-05 LAB — BASIC METABOLIC PANEL
BUN/Creatinine Ratio: 17 (ref 10–24)
BUN: 20 mg/dL (ref 8–27)
CO2: 28 mmol/L (ref 20–29)
Calcium: 9.5 mg/dL (ref 8.6–10.2)
Chloride: 101 mmol/L (ref 96–106)
Creatinine, Ser: 1.15 mg/dL (ref 0.76–1.27)
Glucose: 108 mg/dL — ABNORMAL HIGH (ref 70–99)
Potassium: 5.2 mmol/L (ref 3.5–5.2)
Sodium: 143 mmol/L (ref 134–144)
eGFR: 70 mL/min/{1.73_m2} (ref >59)

## 2023-08-05 LAB — PRO B NATRIURETIC PEPTIDE: NT-Pro BNP: 630 pg/mL — ABNORMAL HIGH (ref 0–376)

## 2023-08-06 MED ORDER — SPIRONOLACTONE 25 MG PO TABS: 12.5000 mg | ORAL_TABLET | Freq: Every day | ORAL | 3 refills | Status: DC

## 2023-08-06 NOTE — Telephone Encounter (Signed)
-----   Message from Delma Freeze sent at 08/05/2023  8:00 AM EST ----- Kidney function looks good. Potassium is a little high so decrease the spironolactone to 1/2 tablet every day. BMET in 7-10 days.  (You may have to also call sister Judeth Cornfield)

## 2023-08-28 ENCOUNTER — Ambulatory Visit: Payer: Medicaid Other | Admitting: Family Medicine

## 2023-08-28 NOTE — Progress Notes (Deleted)
 New patient visit   Patient: Kevin [name]   DOB: [birth year]   68 y.o. Male  MRN: [id number] Visit Date: 08/28/2023  Today's healthcare provider: LAURAINE LOISE BUOY, DO   No chief complaint on file.  Subjective    Kevin [name] is a 68 y.o. male who presents today as a new patient to establish care.  HPI  Cardioverted 07/17/2023 Moderate fatigue with minimal exertion  ***  Past Medical History:  Diagnosis Date   COPD (chronic obstructive pulmonary disease) (HCC)    Diabetes mellitus without complication (HCC)    Heart failure with mid-range ejection fraction (HCC)    a. 2019 reported neg stress test and nl cors on cath in [facility]; b. 07/2019 Echo: EF 25-30%, Gr2 DD, glob HK. Mod reduced RV fxn w/ volume overload. Mod dil RA. Mild to mod TR. Mod elev PASP; b. 12/2019 Echo: EF 50-55%; c. 05/2020 Echo: EF 45-50%, glob HK, sev LVH, GrII DD, nl RV size/fxn, mildly BAE, mild AoV sclerosis, mildly dilated Ao root.   Hypertension    NICM (nonischemic cardiomyopathy) (HCC)    Noncompliance    Pleural effusion on right    a. 07/2019 Thoracentesis: 300 ml.   Tobacco abuse    Past Surgical History:  Procedure Laterality Date   CARDIOVERSION N/A 07/17/2023   Procedure: CARDIOVERSION;  Surgeon: Gardenia Led, DO;  Location: [facility] ORS;  Service: Cardiovascular;  Laterality: N/A;   TEE WITHOUT CARDIOVERSION N/A 07/17/2023   Procedure: TRANSESOPHAGEAL ECHOCARDIOGRAM (TEE);  Surgeon: Gardenia Led, DO;  Location: [facility] ORS;  Service: Cardiovascular;  Laterality: N/A;   Family Status  Relation Name Status   Mother  Alive   Father  Deceased  No partnership data on file   No family history on file. Social History   Socioeconomic History   Marital status: Single    Spouse name: Not on file   Number of children: 2   Years of education: Not on file   Highest education level: High school graduate  Occupational History   Occupation: unemployed  Tobacco Use   Smoking status: Every Day     Current packs/day: 0.50    Average packs/day: 0.3 packs/day for 25.0 years (7.5 ttl pk-yrs)    Types: Cigarettes    Start date: 2020   Smokeless tobacco: Never   Tobacco comments:    a pack lasts a week (appx. 4 days)  Vaping Use   Vaping status: Never Used  Substance and Sexual Activity   Alcohol use: Never   Drug use: Not Currently   Sexual activity: Not Currently  Other Topics Concern   Not on file  Social History Narrative   Patient moved to [place]  from [place]  in March 2020, to take care of his Mother. She lives in a community managed by her church, independent living for seniors. Patient lives with his Mother, does not drive, and reports he mostly keeps to himself.   Social Drivers of Corporate Investment Banker Strain: Low Risk  (07/14/2023)   Overall Financial Resource Strain (CARDIA)    Difficulty of Paying Living Expenses: Not hard at all  Food Insecurity: No Food Insecurity (07/11/2023)   Hunger Vital Sign    Worried About Running Out of Food in the Last Year: Never true    Ran Out of Food in the Last Year: Never true  Transportation Needs: No Transportation Needs (07/14/2023)   PRAPARE - Administrator, Civil Service (Medical): No    Lack  of Transportation (Non-Medical): No  Recent Concern: Transportation Needs - Unmet Transportation Needs (07/11/2023)   PRAPARE - Transportation    Lack of Transportation (Medical): Yes    Lack of Transportation (Non-Medical): Yes  Physical Activity: Insufficiently Active (01/20/2020)   Exercise Vital Sign    Days of Exercise per Week: 2 days    Minutes of Exercise per Session: 30 min  Stress: No Stress Concern Present (08/04/2019)   [employer] of Occupational Health - Occupational Stress Questionnaire    Feeling of Stress : Not at all  Social Connections: Unknown (01/20/2020)   Social Connection and Isolation Panel [NHANES]    Frequency of Communication with Friends and Family: Once a week     Frequency of Social Gatherings with Friends and Family: Once a week    Attends Religious Services: 1 to 4 times per year    Active Member of [employer] or Organizations: Yes    Attends Banker Meetings: Never    Marital Status: Not on file   Outpatient Medications Prior to Visit  Medication Sig   albuterol  (VENTOLIN  HFA) 108 (90 Base) MCG/ACT inhaler Inhale 2 puffs into the lungs every 6 (six) hours as needed for wheezing or shortness of breath.   apixaban  (ELIQUIS ) 5 MG TABS tablet Take 1 tablet (5 mg total) by mouth 2 (two) times daily.   Blood Pressure KIT 1 kit by Does not apply route daily.   carvedilol  (COREG ) 25 MG tablet TAKE ONE TABLET BY MOUTH 2 TIMES A DAY   Cholecalciferol (VITAMIN D ) 50 MCG (2000 UT) tablet Take 1 tablet (2,000 Units total) by mouth daily.   dapagliflozin  propanediol (FARXIGA ) 10 MG TABS tablet Take 1 tablet (10 mg total) by mouth daily.   digoxin  (LANOXIN ) 0.125 MG tablet Take 1 tablet (0.125 mg total) by mouth daily.   furosemide  (LASIX ) 40 MG tablet Take 1.5 tablets (60 mg total) by mouth daily.   gabapentin  (NEURONTIN ) 300 MG capsule Take 300 mg by mouth 2 (two) times daily.   leptospermum manuka honey (MEDIHONEY) PSTE paste Apply 1 Application topically daily.   pravastatin  (PRAVACHOL ) 20 MG tablet TAKE ONE TABLET BY MOUTH EVERY DAY   sacubitril -valsartan  (ENTRESTO ) 49-51 MG Take 1 tablet by mouth 2 (two) times daily.   silver  sulfADIAZINE  (SILVADENE ) 1 % cream Apply topically daily.   spironolactone  (ALDACTONE ) 25 MG tablet Take 0.5 tablets (12.5 mg total) by mouth daily.   tamsulosin  (FLOMAX ) 0.4 MG CAPS capsule Take 1 capsule (0.4 mg total) by mouth daily after supper.   No facility-administered medications prior to visit.   No Known Allergies  Immunization History  Administered Date(s) Administered   PFIZER(Purple Top)SARS-COV-2 Vaccination 11/11/2019, 12/08/2019, 06/10/2020    Health Maintenance  Topic Date Due   Medicare Annual  Wellness (AWV)  Never done   Pneumonia Vaccine 46+ Years old (1 of 2 - PCV) Never done   OPHTHALMOLOGY EXAM  Never done   Diabetic kidney evaluation - Urine ACR  Never done   Hepatitis C Screening  Never done   DTaP/Tdap/Td (1 - Tdap) Never done   Colonoscopy  Never done   Zoster Vaccines- Shingrix (1 of 2) Never done   FOOT EXAM  05/04/2021   INFLUENZA VACCINE  Never done   COVID-19 Vaccine (4 - 2024-25 season) 04/27/2023   HEMOGLOBIN A1C  01/11/2024   Diabetic kidney evaluation - eGFR measurement  08/03/2024   HPV VACCINES  Aged Out    Patient Care Team: Pcp, No as PCP -  General Darliss Rogue, MD as PCP - Cardiology (Cardiology)  Review of Systems  {Insert previous labs (optional):23779} {See past labs  Heme  Chem  Endocrine  Serology  Results Review (optional):1}   Objective    There were no vitals taken for this visit. {Insert last BP/Wt (optional):23777}{See vitals history (optional):1}   Physical Exam   Depression Screen    09/18/2021    1:12 PM 05/16/2021   12:43 PM  PHQ 2/9 Scores  PHQ - 2 Score 0 0   No results found for any visits on 08/28/23.  Assessment & Plan     There are no diagnoses linked to this encounter.   ***  No follow-ups on file.     I discussed the assessment and treatment plan with the patient  The patient was provided an opportunity to ask questions and all were answered. The patient agreed with the plan and demonstrated an understanding of the instructions.   The patient was advised to call back or seek an in-person evaluation if the symptoms worsen or if the condition fails to improve as anticipated.    LAURAINE LOISE BUOY, DO  [facility] [phone] (phone) [phone] (fax)  [facility]

## 2023-09-04 ENCOUNTER — Ambulatory Visit: Payer: Medicaid Other | Admitting: Urology

## 2023-09-04 ENCOUNTER — Ambulatory Visit: Payer: Medicaid Other | Admitting: Physician Assistant

## 2023-09-04 NOTE — Progress Notes (Deleted)
 Advanced Heart Failure Clinic Note    PCP: Pcp, No Cardiologist: Redell Cave, MD (last seen 05/21)  Chief Complaint:  HPI:  Mr Kohan is a 68 y/o male with a history of HTN, dyslipidemia, transaminitis, vitamin D  deficiency, T2DM, atrial flutter/ fibrillation, (s/p cardioversion 07/17/23), COPD, tobacco use and chronic heart failure.   Admitted 07/10/23 due to worsening shortness of breath, orthopnea, PND and worsening swelling in legs/ feet. Found to have HF exacerbation. Echo 07/12/2023 showed EF estimated at 20 to 25%, moderate LVH, moderately reduced RV function, indeterminate LV diastolic parameters, moderate to severe tricuspid regurgitation. Cardiac MRI showed severely reduced RV function and LV systolic function but no evidence of myocardial infiltrative disease. Cardioverted 07/17/23.  Echo 08/04/2019: EF of 25-30% along with mild/moderate TR.  Echo 01/13/20: EF of 50-55% with moderate LVH.  Echo 07/12/23: EF 20-25% with moderate LVH, normal PA pressure of 35.9 mmHg, moderate LAE, mild MR, moderate/severe TR TEE 07/17/23: no thrombus  cMRI 07/18/23:  ECV 33% 1. Mildly dilated LV size, severely reduced LV systolic function. LVEF 26%.  2. There is a thin, midwall, basal septal LGE. 3. Severely reduced RV function. 4. No significant valvular abnormalities. 5. No evidence for myocardial infiltrative disease. 6. Findings consistent with dilated nonischemic cardiomyopathy.  He presents today with his sister, for a HF follow-up visit with a chief complaint of   After getting labs back at last visit, K+ a little high so spironolactone  was decreased to 12.5mg  daily. Did not get repeat lab work drawn.   ROS: All systems negative except as listed in HPI, PMH and Problem List.  SH:  Social History   Socioeconomic History   Marital status: Single    Spouse name: Not on file   Number of children: 2   Years of education: Not on file   Highest education level: High school  graduate  Occupational History   Occupation: unemployed  Tobacco Use   Smoking status: Every Day    Current packs/day: 0.50    Average packs/day: 0.3 packs/day for 25.0 years (7.5 ttl pk-yrs)    Types: Cigarettes    Start date: 2020   Smokeless tobacco: Never   Tobacco comments:    a pack lasts a week (appx. 4 days)  Vaping Use   Vaping status: Never Used  Substance and Sexual Activity   Alcohol use: Never   Drug use: Not Currently   Sexual activity: Not Currently  Other Topics Concern   Not on file  Social History Narrative   Patient moved to [place]  from [place]  in March 2020, to take care of his Mother. She lives in a community managed by her church, independent living for seniors. Patient lives with his Mother, does not drive, and reports he mostly keeps to himself.   Social Drivers of Corporate Investment Banker Strain: Low Risk  (07/14/2023)   Overall Financial Resource Strain (CARDIA)    Difficulty of Paying Living Expenses: Not hard at all  Food Insecurity: No Food Insecurity (07/11/2023)   Hunger Vital Sign    Worried About Running Out of Food in the Last Year: Never true    Ran Out of Food in the Last Year: Never true  Transportation Needs: No Transportation Needs (07/14/2023)   PRAPARE - Administrator, Civil Service (Medical): No    Lack of Transportation (Non-Medical): No  Recent Concern: Transportation Needs - Unmet Transportation Needs (07/11/2023)   PRAPARE - Transportation    Lack  of Transportation (Medical): Yes    Lack of Transportation (Non-Medical): Yes  Physical Activity: Insufficiently Active (01/20/2020)   Exercise Vital Sign    Days of Exercise per Week: 2 days    Minutes of Exercise per Session: 30 min  Stress: No Stress Concern Present (08/04/2019)   [employer] of Occupational Health - Occupational Stress Questionnaire    Feeling of Stress : Not at all  Social Connections: Unknown (01/20/2020)   Social Connection  and Isolation Panel [NHANES]    Frequency of Communication with Friends and Family: Once a week    Frequency of Social Gatherings with Friends and Family: Once a week    Attends Religious Services: 1 to 4 times per year    Active Member of [employer] or Organizations: Yes    Attends Banker Meetings: Never    Marital Status: Not on file  Intimate Partner Violence: Not At Risk (07/11/2023)   Humiliation, Afraid, Rape, and Kick questionnaire    Fear of Current or Ex-Partner: No    Emotionally Abused: No    Physically Abused: No    Sexually Abused: No    FH: No family history on file.  Past Medical History:  Diagnosis Date   COPD (chronic obstructive pulmonary disease) (HCC)    Diabetes mellitus without complication (HCC)    Heart failure with mid-range ejection fraction (HCC)    a. 2019 reported neg stress test and nl cors on cath in [facility]; b. 07/2019 Echo: EF 25-30%, Gr2 DD, glob HK. Mod reduced RV fxn w/ volume overload. Mod dil RA. Mild to mod TR. Mod elev PASP; b. 12/2019 Echo: EF 50-55%; c. 05/2020 Echo: EF 45-50%, glob HK, sev LVH, GrII DD, nl RV size/fxn, mildly BAE, mild AoV sclerosis, mildly dilated Ao root.   Hypertension    NICM (nonischemic cardiomyopathy) (HCC)    Noncompliance    Pleural effusion on right    a. 07/2019 Thoracentesis: 300 ml.   Tobacco abuse     Current Outpatient Medications  Medication Sig Dispense Refill   albuterol  (VENTOLIN  HFA) 108 (90 Base) MCG/ACT inhaler Inhale 2 puffs into the lungs every 6 (six) hours as needed for wheezing or shortness of breath. 18 g 3   apixaban  (ELIQUIS ) 5 MG TABS tablet Take 1 tablet (5 mg total) by mouth 2 (two) times daily. 60 tablet 0   Blood Pressure KIT 1 kit by Does not apply route daily. 1 kit 0   carvedilol  (COREG ) 25 MG tablet TAKE ONE TABLET BY MOUTH 2 TIMES A DAY 180 tablet 3   Cholecalciferol (VITAMIN D ) 50 MCG (2000 UT) tablet Take 1 tablet (2,000 Units total) by mouth daily. 30 tablet 0    dapagliflozin  propanediol (FARXIGA ) 10 MG TABS tablet Take 1 tablet (10 mg total) by mouth daily. 30 tablet 0   digoxin  (LANOXIN ) 0.125 MG tablet Take 1 tablet (0.125 mg total) by mouth daily. 30 tablet 0   furosemide  (LASIX ) 40 MG tablet Take 1.5 tablets (60 mg total) by mouth daily. 60 tablet 0   gabapentin  (NEURONTIN ) 300 MG capsule Take 300 mg by mouth 2 (two) times daily.     leptospermum manuka honey (MEDIHONEY) PSTE paste Apply 1 Application topically daily. 15 mL 0   pravastatin  (PRAVACHOL ) 20 MG tablet TAKE ONE TABLET BY MOUTH EVERY DAY 90 tablet 3   sacubitril -valsartan  (ENTRESTO ) 49-51 MG Take 1 tablet by mouth 2 (two) times daily. 60 tablet 0   silver  sulfADIAZINE  (SILVADENE ) 1 % cream  Apply topically daily. 50 g 0   spironolactone  (ALDACTONE ) 25 MG tablet Take 0.5 tablets (12.5 mg total) by mouth daily. 45 tablet 3   tamsulosin  (FLOMAX ) 0.4 MG CAPS capsule Take 1 capsule (0.4 mg total) by mouth daily after supper. 30 capsule 0   No current facility-administered medications for this visit.      PHYSICAL EXAM:  General:  Well appearing. No resp difficulty HEENT: normal Neck: supple. JVP flat. Carotids 2+ bilaterally; no bruits. No lymphadenopathy or thryomegaly appreciated. Cor: PMI normal. Regular rate & rhythm. No rubs, gallops or murmurs. Lungs: clear Abdomen: soft, nontender, nondistended. No hepatosplenomegaly. No bruits or masses.  Extremities: no cyanosis, clubbing, rash, edema Neuro: alert & orientedx3, cranial nerves grossly intact. Moves all 4 extremities w/o difficulty. Affect pleasant.   ECG:   ASSESSMENT & PLAN:  1: Chronic heart failure with reduced ejection fraction- - suspect due to HTN - NYHA class III - euvolemic today - not weighing daily as he needs new scales - weight 156 pounds from last visit here 1 month  ago - Echo 08/04/2019: EF of 25-30% along with mild/moderate TR.  - Echo 01/13/20: EF of 50-55% with moderate LVH.  - Echo 07/12/23: EF  20-25% with moderate LVH, normal PA pressure of 35.9 mmHg, moderate LAE, mild MR, moderate/severe TR - TEE 07/17/23: no thrombus - cMRI 07/18/23:    ECV 33%   1. Mildly dilated LV size, severely reduced LV systolic function. LVEF 26%.    2. There is a thin, midwall, basal septal LGE.   3. Severely reduced RV function.   4. No significant valvular abnormalities.   5. No evidence for myocardial infiltrative disease.   6. Findings consistent with dilated nonischemic cardiomyopathy. - not adding salt and has been trying to read food labels; reviewed the importance of closely following a 2000mg  sodium diet  - continue carvedilol  25mg  BID - continue farxiga  10mg  daily - continue digoxin  0.125mg  daily - continue furosemide  60mg  daily - continue entresto  49/51mg  BID - continue spironolactone  12.5mg  daily - BMET today - saw cardiology (Agbor-Etang) 05/21; waiting on cardiology appt to be scheduled - BNP 07/14/23 was 1517.1  2: HTN- - BP  - NS for new PCP 01/25 - BMP 08/04/23 reviewed and showed sodium 143, potassium 5.2, creatinine 1.15 and GFR 70 - BMET today  3: COPD-  - using nebulizer and inhalers - smoking 3 cigarettes daily  4: PAF- - cardioverted 07/17/23 - continue apixaban  5mg  BID

## 2023-09-05 ENCOUNTER — Encounter: Payer: Self-pay | Admitting: Urology

## 2023-09-08 ENCOUNTER — Telehealth: Payer: Self-pay | Admitting: Family

## 2023-09-08 ENCOUNTER — Encounter: Payer: Medicare HMO | Admitting: Family

## 2023-09-08 NOTE — Telephone Encounter (Signed)
 Patient did not show for his Heart Failure Clinic appointment on 09/08/23.

## 2023-10-03 ENCOUNTER — Telehealth: Payer: Self-pay | Admitting: Family

## 2023-10-03 NOTE — Telephone Encounter (Signed)
 Unable to lvm someone answered saying wrong number

## 2023-10-15 ENCOUNTER — Telehealth: Payer: Self-pay | Admitting: Family

## 2023-10-15 NOTE — Telephone Encounter (Signed)
LM on patient's voicemail about rescheduling his missed [name] appt on 09/08/23. Reached his sister, Kevin [name], who lives in [place] and she says that she will relay this message to the patient as well.

## 2024-04-03 ENCOUNTER — Other Ambulatory Visit: Payer: Self-pay

## 2024-04-03 ENCOUNTER — Inpatient Hospital Stay: Admission: EM | Admit: 2024-04-03 | Discharge: 2024-04-09 | DRG: 291 | Attending: Student | Admitting: Student

## 2024-04-03 ENCOUNTER — Inpatient Hospital Stay

## 2024-04-03 ENCOUNTER — Emergency Department

## 2024-04-03 DIAGNOSIS — Z7901 Long term (current) use of anticoagulants: Secondary | ICD-10-CM | POA: Diagnosis not present

## 2024-04-03 DIAGNOSIS — R601 Generalized edema: Secondary | ICD-10-CM | POA: Diagnosis present

## 2024-04-03 DIAGNOSIS — Z79899 Other long term (current) drug therapy: Secondary | ICD-10-CM | POA: Diagnosis not present

## 2024-04-03 DIAGNOSIS — I5043 Acute on chronic combined systolic (congestive) and diastolic (congestive) heart failure: Secondary | ICD-10-CM | POA: Diagnosis present

## 2024-04-03 DIAGNOSIS — D72829 Elevated white blood cell count, unspecified: Secondary | ICD-10-CM | POA: Diagnosis present

## 2024-04-03 DIAGNOSIS — I110 Hypertensive heart disease with heart failure: Secondary | ICD-10-CM | POA: Diagnosis present

## 2024-04-03 DIAGNOSIS — J449 Chronic obstructive pulmonary disease, unspecified: Secondary | ICD-10-CM | POA: Diagnosis present

## 2024-04-03 DIAGNOSIS — I480 Paroxysmal atrial fibrillation: Secondary | ICD-10-CM | POA: Diagnosis present

## 2024-04-03 DIAGNOSIS — I10 Essential (primary) hypertension: Secondary | ICD-10-CM

## 2024-04-03 DIAGNOSIS — I509 Heart failure, unspecified: Principal | ICD-10-CM

## 2024-04-03 DIAGNOSIS — E611 Iron deficiency: Secondary | ICD-10-CM | POA: Diagnosis present

## 2024-04-03 DIAGNOSIS — N179 Acute kidney failure, unspecified: Secondary | ICD-10-CM | POA: Diagnosis present

## 2024-04-03 DIAGNOSIS — E559 Vitamin D deficiency, unspecified: Secondary | ICD-10-CM | POA: Diagnosis present

## 2024-04-03 DIAGNOSIS — R600 Localized edema: Secondary | ICD-10-CM

## 2024-04-03 DIAGNOSIS — I420 Dilated cardiomyopathy: Secondary | ICD-10-CM | POA: Diagnosis present

## 2024-04-03 DIAGNOSIS — I472 Ventricular tachycardia, unspecified: Secondary | ICD-10-CM | POA: Diagnosis not present

## 2024-04-03 DIAGNOSIS — K761 Chronic passive congestion of liver: Secondary | ICD-10-CM | POA: Diagnosis present

## 2024-04-03 DIAGNOSIS — E119 Type 2 diabetes mellitus without complications: Secondary | ICD-10-CM | POA: Diagnosis present

## 2024-04-03 DIAGNOSIS — R7989 Other specified abnormal findings of blood chemistry: Secondary | ICD-10-CM

## 2024-04-03 DIAGNOSIS — E1140 Type 2 diabetes mellitus with diabetic neuropathy, unspecified: Secondary | ICD-10-CM

## 2024-04-03 DIAGNOSIS — Z87891 Personal history of nicotine dependence: Secondary | ICD-10-CM | POA: Diagnosis not present

## 2024-04-03 DIAGNOSIS — I161 Hypertensive emergency: Secondary | ICD-10-CM | POA: Diagnosis present

## 2024-04-03 LAB — COMPREHENSIVE METABOLIC PANEL WITH GFR
ALT: 99 U/L — ABNORMAL HIGH (ref 0–44)
AST: 70 U/L — ABNORMAL HIGH (ref 15–41)
Albumin: 3.2 g/dL — ABNORMAL LOW (ref 3.5–5.0)
Alkaline Phosphatase: 309 U/L — ABNORMAL HIGH (ref 38–126)
Anion gap: 11 (ref 5–15)
BUN: 45 mg/dL — ABNORMAL HIGH (ref 8–23)
CO2: 28 mmol/L (ref 22–32)
Calcium: 8.8 mg/dL — ABNORMAL LOW (ref 8.9–10.3)
Chloride: 103 mmol/L (ref 98–111)
Creatinine, Ser: 1.41 mg/dL — ABNORMAL HIGH (ref 0.61–1.24)
GFR, Estimated: 55 mL/min — ABNORMAL LOW (ref >60)
Glucose, Bld: 112 mg/dL — ABNORMAL HIGH (ref 70–99)
Potassium: 3.8 mmol/L (ref 3.5–5.1)
Sodium: 142 mmol/L (ref 135–145)
Total Bilirubin: 2.1 mg/dL — ABNORMAL HIGH (ref 0.0–1.2)
Total Protein: 7.1 g/dL (ref 6.5–8.1)

## 2024-04-03 LAB — CBC WITH DIFFERENTIAL/PLATELET
Abs Immature Granulocytes: 0.04 K/uL (ref 0.00–0.07)
Basophils Absolute: 0 K/uL (ref 0.0–0.1)
Basophils Relative: 0 %
Eosinophils Absolute: 0.1 K/uL (ref 0.0–0.5)
Eosinophils Relative: 1 %
HCT: 43.8 % (ref 39.0–52.0)
Hemoglobin: 13.9 g/dL (ref 13.0–17.0)
Immature Granulocytes: 0 %
Lymphocytes Relative: 23 %
Lymphs Abs: 2 K/uL (ref 0.7–4.0)
MCH: 31.7 pg (ref 26.0–34.0)
MCHC: 31.7 g/dL (ref 30.0–36.0)
MCV: 99.8 fL (ref 80.0–100.0)
Monocytes Absolute: 0.9 K/uL (ref 0.1–1.0)
Monocytes Relative: 10 %
Neutro Abs: 5.9 K/uL (ref 1.7–7.7)
Neutrophils Relative %: 66 %
Platelets: 253 K/uL (ref 150–400)
RBC: 4.39 MIL/uL (ref 4.22–5.81)
RDW: 14.8 % (ref 11.5–15.5)
WBC: 8.9 K/uL (ref 4.0–10.5)
nRBC: 0.3 % — ABNORMAL HIGH (ref 0.0–0.2)

## 2024-04-03 LAB — BLOOD GAS, VENOUS
Acid-Base Excess: 7.1 mmol/L — ABNORMAL HIGH (ref 0.0–2.0)
Bicarbonate: 33.9 mmol/L — ABNORMAL HIGH (ref 20.0–28.0)
Patient temperature: 37
pCO2, Ven: 56 mmHg (ref 44–60)
pH, Ven: 7.39 (ref 7.25–7.43)

## 2024-04-03 LAB — URINALYSIS, W/ REFLEX TO CULTURE (INFECTION SUSPECTED)
Bacteria, UA: NONE SEEN
Bilirubin Urine: NEGATIVE
Glucose, UA: NEGATIVE mg/dL
Hgb urine dipstick: NEGATIVE
Ketones, ur: NEGATIVE mg/dL
Leukocytes,Ua: NEGATIVE
Nitrite: NEGATIVE
Protein, ur: NEGATIVE mg/dL
RBC / HPF: 0 RBC/hpf (ref 0–5)
Specific Gravity, Urine: 1.006 (ref 1.005–1.030)
WBC, UA: 0 WBC/hpf (ref 0–5)
pH: 5 (ref 5.0–8.0)

## 2024-04-03 LAB — LIPASE, BLOOD: Lipase: 27 U/L (ref 11–51)

## 2024-04-03 LAB — TROPONIN I (HIGH SENSITIVITY)
Troponin I (High Sensitivity): 65 ng/L — ABNORMAL HIGH (ref <18)
Troponin I (High Sensitivity): 54 ng/L — ABNORMAL HIGH (ref <18)

## 2024-04-03 LAB — PROTIME-INR
INR: 1.3 — ABNORMAL HIGH (ref 0.8–1.2)
Prothrombin Time: 16.6 s — ABNORMAL HIGH (ref 11.4–15.2)

## 2024-04-03 LAB — LACTIC ACID, PLASMA: Lactic Acid, Venous: 1.7 mmol/L (ref 0.5–1.9)

## 2024-04-03 LAB — BRAIN NATRIURETIC PEPTIDE: B Natriuretic Peptide: 3957.9 pg/mL — ABNORMAL HIGH (ref 0.0–100.0)

## 2024-04-03 MED ORDER — POTASSIUM CHLORIDE CRYS ER 20 MEQ PO TBCR
40.0000 meq | EXTENDED_RELEASE_TABLET | Freq: Once | ORAL | Status: AC
  Administered 2024-04-03: 40 meq via ORAL
  Filled 2024-04-03: qty 2

## 2024-04-03 MED ORDER — CARVEDILOL 25 MG PO TABS
25.0000 mg | ORAL_TABLET | Freq: Two times a day (BID) | ORAL | Status: DC
  Filled 2024-04-03: qty 1

## 2024-04-03 MED ORDER — FUROSEMIDE 10 MG/ML IJ SOLN
60.0000 mg | Freq: Once | INTRAMUSCULAR | Status: AC
  Administered 2024-04-03: 60 mg via INTRAVENOUS
  Filled 2024-04-03: qty 8

## 2024-04-03 MED ORDER — SODIUM CHLORIDE 0.9% FLUSH
3.0000 mL | Freq: Two times a day (BID) | INTRAVENOUS | Status: DC
  Administered 2024-04-03 – 2024-04-06 (×9): 3 mL via INTRAVENOUS
  Administered 2024-04-06: 10 mL via INTRAVENOUS
  Administered 2024-04-06: 3 mL via INTRAVENOUS
  Administered 2024-04-06: 10 mL via INTRAVENOUS
  Administered 2024-04-07 – 2024-04-08 (×6): 3 mL via INTRAVENOUS

## 2024-04-03 MED ORDER — ACETAMINOPHEN 325 MG PO TABS
650.0000 mg | ORAL_TABLET | Freq: Four times a day (QID) | ORAL | Status: DC | PRN
  Administered 2024-04-03 – 2024-04-08 (×14): 650 mg via ORAL
  Filled 2024-04-03 (×11): qty 2

## 2024-04-03 MED ORDER — APIXABAN 5 MG PO TABS
5.0000 mg | ORAL_TABLET | Freq: Two times a day (BID) | ORAL | Status: DC
  Administered 2024-04-03 – 2024-04-09 (×19): 5 mg via ORAL
  Filled 2024-04-03 (×13): qty 1

## 2024-04-03 MED ORDER — HYDRALAZINE HCL 20 MG/ML IJ SOLN
5.0000 mg | Freq: Four times a day (QID) | INTRAMUSCULAR | Status: DC | PRN
  Administered 2024-04-03 – 2024-04-04 (×2): 5 mg via INTRAVENOUS
  Filled 2024-04-03 (×2): qty 1

## 2024-04-03 MED ORDER — SODIUM CHLORIDE 0.9% FLUSH
3.0000 mL | INTRAVENOUS | Status: DC | PRN
  Administered 2024-04-03: 3 mL via INTRAVENOUS

## 2024-04-03 MED ORDER — DIGOXIN 125 MCG PO TABS
0.1250 mg | ORAL_TABLET | Freq: Every day | ORAL | Status: DC
  Filled 2024-04-03: qty 1

## 2024-04-03 MED ORDER — HYDRALAZINE HCL 25 MG PO TABS
25.0000 mg | ORAL_TABLET | Freq: Three times a day (TID) | ORAL | Status: DC
  Administered 2024-04-03 – 2024-04-09 (×28): 25 mg via ORAL
  Filled 2024-04-03 (×19): qty 1

## 2024-04-03 MED ORDER — FUROSEMIDE 10 MG/ML IJ SOLN
40.0000 mg | Freq: Two times a day (BID) | INTRAMUSCULAR | Status: DC
  Administered 2024-04-03 – 2024-04-05 (×5): 40 mg via INTRAVENOUS
  Filled 2024-04-03 (×4): qty 4

## 2024-04-03 MED ORDER — ONDANSETRON HCL 4 MG/2ML IJ SOLN: 4.0000 mg | Freq: Four times a day (QID) | INTRAMUSCULAR | Status: DC | PRN

## 2024-04-03 MED ORDER — NITROGLYCERIN 2 % TD OINT
1.0000 [in_us] | TOPICAL_OINTMENT | Freq: Once | TRANSDERMAL | Status: AC
  Administered 2024-04-03: 1 [in_us] via TOPICAL
  Filled 2024-04-03: qty 1

## 2024-04-03 MED ORDER — ISOSORBIDE MONONITRATE ER 30 MG PO TB24
30.0000 mg | ORAL_TABLET | Freq: Every day | ORAL | Status: DC
  Administered 2024-04-03 – 2024-04-09 (×10): 30 mg via ORAL
  Filled 2024-04-03 (×7): qty 1

## 2024-04-03 MED ORDER — CARVEDILOL 3.125 MG PO TABS
3.1250 mg | ORAL_TABLET | Freq: Two times a day (BID) | ORAL | Status: DC
  Administered 2024-04-03 – 2024-04-09 (×18): 3.125 mg via ORAL
  Filled 2024-04-03 (×12): qty 1

## 2024-04-03 MED ORDER — TAMSULOSIN HCL 0.4 MG PO CAPS
0.4000 mg | ORAL_CAPSULE | Freq: Every day | ORAL | Status: DC
Start: 2024-04-03 — End: 2024-04-09
  Administered 2024-04-03 – 2024-04-08 (×9): 0.4 mg via ORAL
  Filled 2024-04-03 (×6): qty 1

## 2024-04-03 MED ORDER — SODIUM CHLORIDE 0.9 % IV SOLN: 250.0000 mL | INTRAVENOUS | Status: AC | PRN

## 2024-04-03 NOTE — TOC CM/SW Note (Addendum)
..  Transition of Care [facility]) - Inpatient Brief Assessment   Patient Details  Name: Kevin [name] MRN: [id number] Date of Birth: 1956-04-10  Transition of Care [facility]) CM/SW Contact:    Edsel DELENA Fischer, LCSW Phone Number: 04/03/2024, 9:13 AM   Clinical Narrative:    SW to handoff to heart failure team to follow up with pt.  If additional needs are present, sw to address  Transition of Care Asessment:

## 2024-04-03 NOTE — ED Notes (Addendum)
 4+ pitting edema w/ serous weeping noted in feet bilaterally, 3+ pitting edema in lower legs bilaterally. Skin tear on top of right foot noted. Pt repositioned in bed to elevate feet above level of heart with pillows. Pt AOX4, NAD noted, pt is pleasant and cooperative. Law enforcement at bedside. Lung sounds clear bilaterally, pt denies SHOB/dyspnea.

## 2024-04-03 NOTE — ED Notes (Signed)
 Report given to [employer] at [employer].

## 2024-04-03 NOTE — ED Notes (Signed)
 Patient w/ Ultrasound

## 2024-04-03 NOTE — ED Provider Notes (Signed)
 [facility] Provider Note    Event Date/Time   First MD Initiated Contact with Patient 04/03/24 0406     (approximate)   History   No chief complaint on file.   HPI Kevin [name] is a 68 y.o. male who [facility] by EMS from jail accompanied by law enforcement.  He has a history of CHF and he said that gradually over time his feet and legs become increasingly swollen.  Over the last 1 to 2 days he has developed a cough and a feeling of tightness all throughout his abdomen.  No chest pain, some shortness of breath associated with exertion.  Feels like it does when he has too much fluid.  He said he has been urinating normally.  No recent accidents or injuries.  No recent fever.     Physical Exam   ED Triage Vitals  Encounter Vitals Group     BP 04/03/24 0450 (!) 185/125     Girls Systolic BP Percentile --      Girls Diastolic BP Percentile --      Boys Systolic BP Percentile --      Boys Diastolic BP Percentile --      Pulse Rate 04/03/24 0450 71     Resp 04/03/24 0450 18     Temp 04/03/24 0450 97.6 F (36.4 C)     Temp Source 04/03/24 0450 Oral     SpO2 04/03/24 0450 100 %     Weight 04/03/24 0411 90.3 kg (199 lb)     Height 04/03/24 0411 1.702 m (5' 7)     Head Circumference --      Peak Flow --      Pain Score 04/03/24 0450 7     Pain Loc --      Pain Education --      Exclude from Growth Chart --       Most recent vital signs: Vitals:   04/03/24 0450  BP: (!) 185/125  Pulse: 71  Resp: 18  Temp: 97.6 F (36.4 C)  SpO2: 100%    General: Awake, no obvious distress, alert and oriented. CV:  Normal heart sounds.  Regular rate and rhythm.  Extensive 3+ peripheral edema in bilateral lower extremities with some skin changes of weeping but no evidence of active infection Resp:  Normal effort. Speaking easily and comfortably, no accessory muscle usage nor intercostal retractions.  Lungs clear to auscultation. Abd:  Abdomen is nontender to  palpation but is tense, consistent with anasarca   ED Results / Procedures / Treatments   Labs (all labs ordered are listed, but only abnormal results are displayed) Labs Reviewed  COMPREHENSIVE METABOLIC PANEL WITH GFR - Abnormal; Notable for the following components:      Result Value   Glucose, Bld 112 (*)    BUN 45 (*)    Creatinine, Ser 1.41 (*)    Calcium  8.8 (*)    Albumin 3.2 (*)    AST 70 (*)    ALT 99 (*)    Alkaline Phosphatase 309 (*)    Total Bilirubin 2.1 (*)    GFR, Estimated 55 (*)    All other components within normal limits  CBC WITH DIFFERENTIAL/PLATELET - Abnormal; Notable for the following components:   nRBC 0.3 (*)    All other components within normal limits  PROTIME-INR - Abnormal; Notable for the following components:   Prothrombin Time 16.6 (*)    INR 1.3 (*)    All other  components within normal limits  BRAIN NATRIURETIC PEPTIDE - Abnormal; Notable for the following components:   B Natriuretic Peptide 3,957.9 (*)    All other components within normal limits  BLOOD GAS, VENOUS - Abnormal; Notable for the following components:   Bicarbonate 33.9 (*)    Acid-Base Excess 7.1 (*)    All other components within normal limits  TROPONIN I (HIGH SENSITIVITY) - Abnormal; Notable for the following components:   Troponin I (High Sensitivity) 65 (*)    All other components within normal limits  CULTURE, BLOOD (SINGLE)  LACTIC ACID, PLASMA  LIPASE, BLOOD  URINALYSIS, W/ REFLEX TO CULTURE (INFECTION SUSPECTED)  TROPONIN I (HIGH SENSITIVITY)     EKG  ED ECG REPORT I, Darleene Dome, the attending physician, personally viewed and interpreted this ECG.  Date: 04/03/2024 EKG Time: 4:28 AM Rate: 65 Rhythm: normal sinus rhythm QRS Axis: Left axis deviation Intervals: Short PR interval with nonspecific interventricular conduction delay ST/T Wave abnormalities: Non-specific ST segment / T-wave changes, but no clear evidence of acute ischemia. Narrative  Interpretation: no definitive evidence of acute ischemia; does not meet STEMI criteria.    RADIOLOGY See ED course for details   PROCEDURES:  Critical Care performed: Yes, see critical care procedure note(s)  .Critical Care  Performed by: Dome Darleene, MD Authorized by: Dome Darleene, MD   Critical care provider statement:    Critical care time (minutes):  30   Critical care time was exclusive of:  Separately billable procedures and treating other patients   Critical care was necessary to treat or prevent imminent or life-threatening deterioration of the following conditions:  Cardiac failure   Critical care was time spent personally by me on the following activities:  Development of treatment plan with patient or surrogate, evaluation of patient's response to treatment, examination of patient, obtaining history from patient or surrogate, ordering and performing treatments and interventions, ordering and review of laboratory studies, ordering and review of radiographic studies, pulse oximetry, re-evaluation of patient's condition and review of old charts     IMPRESSION / MDM / ASSESSMENT AND PLAN / ED COURSE  I reviewed the triage vital signs and the nursing notes.                              Differential diagnosis includes, but is not limited to, CHF exacerbation, acute infection such as pneumoni high-sensitivity troponin, BNP, lipase, CMP, lactic acid a, cellulitis, intra-abdominal infection  Patient's presentation is most consistent with acute presentation with potential threat to life or bodily function.  Labs/studies ordered: CMP, CBC with differential, lipase, BNP, high-sensitivity troponin, lactic acid, blood culture, urinalysis, chest x-ray, EKG  Interventions/Medications given:  Medications  furosemide  (LASIX ) injection 60 mg (60 mg Intravenous Given 04/03/24 0621)  nitroGLYCERIN  (NITROGLYN) 2 % ointment 1 inch (1 inch Topical Given 04/03/24 0622)    (Note:  hospital  course my include additional interventions and/or labs/studies not listed above.)   Strongly suspect a CHF exacerbation, no obvious sign of infection, labs notable for hypertension, does not meet SIRS criteria.  Unclear if patient has been able to be consistent with his medication regimen while in jail.  Patient clearly needs diuresis.  I independently viewed and interpreted the patient's chest x-ray and I see no obvious pulmonary edema or infectious process, but it is likely developing given the recent onset of his cough.  Awaiting her lab results but anticipate need for hospitalization given the  degree of peripheral edema and anasarca     Clinical Course as of 04/03/24 0631  Sat Apr 03, 2024  0546 Labs consistent with at least a degree of demand ischemia with troponin of 65 in the setting of chronic disease and a CHF exacerbation.  No concern for ACS at this point.  He has some nonspecific LFT elevation but no abdominal pain and no tenderness to palpation.  I am consulting hospitalist team for admission and I have ordered furosemide  60 mg IV (he reportedly takes 60 mg oral furosemide  daily at home).  I also ordered 1 inch of nitroglycerin  paste to reduce his hypertension and reduce his preload [CF]  0550 I am consulting the hospitalist team for admission.   [CF]  [id number] I consulted by phone with the admitting hospitalist, and they will admit the patient - Dr. Lawence [CF]  [phone] B Natriuretic Peptide(!): 3,957.9 [CF]    Clinical Course User Index [CF] Gordan Huxley, MD     FINAL CLINICAL IMPRESSION(S) / ED DIAGNOSES   Final diagnoses:  Acute on chronic congestive heart failure, unspecified heart failure type (HCC)  Hypertension, unspecified type  Peripheral edema  Anasarca  Elevated LFTs  Elevated troponin level     Rx / DC Orders   ED Discharge Orders     None        Note:  This document was prepared using Dragon voice recognition software and may include unintentional  dictation errors.   Gordan Huxley, MD 04/03/24 [id number]

## 2024-04-03 NOTE — ED Notes (Addendum)
 Pt moves around frequently and leads come off often/removes leads often despite education- checking on pt routinely

## 2024-04-03 NOTE — ED Notes (Signed)
 Pt informed to remain NPO until US  is completed. Pt verbalizes understanding.

## 2024-04-03 NOTE — ED Triage Notes (Signed)
 Pt arrived via EMS from [place] [facility] for CHF exacerbation. Pitting edema noted to bilateral feet. Pt also reports a new cough.

## 2024-04-03 NOTE — H&P (Addendum)
 History and Physical    [name]:[id number] DOB: March 19, 1956 DOA: 04/03/2024  PCP: Pcp, No (Confirm with patient/family/NH records and if not entered, this has to be entered at [facility] point of entry) Patient coming from: [place] [facility]  I have personally briefly reviewed patient's old medical records in [facility]  Chief Complaint: Shortness of breath, leg swelling  HPI: Kevin [name] is a 68 y.o. 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  Patient reported that he has a history of CHF and taking Entresto  and Aldactone  and Lasix  daily, and has been compliant with his CHF medications.  Recently, he has been in [place] [facility] since March this year, and gradually he started to notice increasing swelling of bilateral feet and legs, and increasing exertional dyspnea at least 1+ month.  This week, he started to develop a dry cough, but denied any chest pain.  Last 2 nights he could not lie flat due to increasing cough and shortness of breath at night.  No fever or chills.  In addition he also complained about feeling no appetite and poor intake for last 2 to 3 days.  ED Course: Afebrile, nontachycardic blood pressure 180/120 O2 saturation 97% on room air.  Chest x-ray showed cardiomegaly and pulmonary congestion, blood work showed hemoglobin 13.9, BUN 45 creatinine 1.4 compared to baseline 1.1-1.2, AST 70 ALT 96 bilirubin 2.1  Patient was given IV Lasix  60 mg x 1 in ED.  Review of Systems: As per HPI otherwise 14 point review of systems negative.    Past Medical History:  Diagnosis Date   COPD (chronic obstructive pulmonary disease) (HCC)    Diabetes mellitus without complication (HCC)    Heart failure with mid-range ejection fraction (HCC)    a. 2019 reported neg stress test and nl cors on cath in [facility]; b. 07/2019 Echo: EF 25-30%,  Gr2 DD, glob HK. Mod reduced RV fxn w/ volume overload. Mod dil RA. Mild to mod TR. Mod elev PASP; b. 12/2019 Echo: EF 50-55%; c. 05/2020 Echo: EF 45-50%, glob HK, sev LVH, GrII DD, nl RV size/fxn, mildly BAE, mild AoV sclerosis, mildly dilated Ao root.   Hypertension    NICM (nonischemic cardiomyopathy) (HCC)    Noncompliance    Pleural effusion on right    a. 07/2019 Thoracentesis: 300 ml.   Tobacco abuse     Past Surgical History:  Procedure Laterality Date   CARDIOVERSION N/A 07/17/2023   Procedure: CARDIOVERSION;  Surgeon: Gardenia Led, DO;  Location: [facility] ORS;  Service: Cardiovascular;  Laterality: N/A;   TEE WITHOUT CARDIOVERSION N/A 07/17/2023   Procedure: TRANSESOPHAGEAL ECHOCARDIOGRAM (TEE);  Surgeon: Gardenia Led, DO;  Location: [facility] ORS;  Service: Cardiovascular;  Laterality: N/A;     reports that he has been smoking cigarettes. He started smoking about 5 years ago. He has a 7.8 pack-year smoking history. He has never used smokeless tobacco. He reports that he does not currently use drugs. He reports that he does not drink alcohol.  No Known Allergies  No family history on file.   Prior to Admission medications   Medication Sig Start Date End Date Taking? Authorizing Provider  furosemide  (LASIX ) 40 MG tablet Take 1.5 tablets (60 mg total) by mouth daily. 07/31/23  Yes Jens Durand, MD  metoprolol tartrate (LOPRESSOR) 25 MG tablet Take 25 mg by mouth 2 (two) times daily.   Yes  [provider]  sacubitril -valsartan  (ENTRESTO ) 49-51 MG Take 1 tablet by mouth 2 (two) times daily. 07/31/23  Yes Jens Durand, MD  spironolactone  (ALDACTONE ) 25 MG tablet Take 0.5 tablets (12.5 mg total) by mouth daily. Patient taking differently: Take 25 mg by mouth daily. 08/06/23 04/03/24 Yes Hackney, Ellouise LABOR, FNP  albuterol  (VENTOLIN  HFA) 108 (90 Base) MCG/ACT inhaler Inhale 2 puffs into the lungs every 6 (six) hours as needed for wheezing or shortness of breath. Patient not  taking: Reported on 04/03/2024 09/08/19   Donette Ellouise LABOR, FNP  apixaban  (ELIQUIS ) 5 MG TABS tablet Take 1 tablet (5 mg total) by mouth 2 (two) times daily. Patient not taking: Reported on 04/03/2024 07/31/23   Jens Durand, MD  Blood Pressure KIT 1 kit by Does not apply route daily. 12/23/19   Iloabachie, Chioma E, NP  carvedilol  (COREG ) 25 MG tablet TAKE ONE TABLET BY MOUTH 2 TIMES A DAY Patient not taking: Reported on 04/03/2024 02/28/23 02/28/24  Lang Dover, MD  Cholecalciferol (VITAMIN D ) 50 MCG (2000 UT) tablet Take 1 tablet (2,000 Units total) by mouth daily. Patient not taking: Reported on 04/03/2024 07/31/23   Jens Durand, MD  dapagliflozin  propanediol (FARXIGA ) 10 MG TABS tablet Take 1 tablet (10 mg total) by mouth daily. Patient not taking: Reported on 04/03/2024 08/01/23   Jens Durand, MD  digoxin  (LANOXIN ) 0.125 MG tablet Take 1 tablet (0.125 mg total) by mouth daily. Patient not taking: Reported on 04/03/2024 08/01/23   Jens Durand, MD  gabapentin  (NEURONTIN ) 300 MG capsule Take 300 mg by mouth 2 (two) times daily. Patient not taking: Reported on 04/03/2024    [provider]  leptospermum manuka honey (MEDIHONEY) PSTE paste Apply 1 Application topically daily. Patient not taking: Reported on 04/03/2024 08/01/23   Jens Durand, MD  pravastatin  (PRAVACHOL ) 20 MG tablet TAKE ONE TABLET BY MOUTH EVERY DAY Patient not taking: Reported on 04/03/2024 02/28/23 02/28/24  Lang Dover, MD  silver  sulfADIAZINE  (SILVADENE ) 1 % cream Apply topically daily. Patient not taking: Reported on 04/03/2024 08/01/23   Jens Durand, MD  tamsulosin  (FLOMAX ) 0.4 MG CAPS capsule Take 1 capsule (0.4 mg total) by mouth daily after supper. 07/31/23   Jens Durand, MD    Physical Exam: Vitals:   04/03/24 0805 04/03/24 0825 04/03/24 0830 04/03/24 0851  BP: (!) 135/90  (!) 140/79   Pulse: 76 73 73   Resp: 20 19 (!) 23   Temp:    98.2 F (36.8 C)  TempSrc:    Oral  SpO2: 97% 100% 100%   Weight:       Height:        Constitutional: NAD, calm, comfortable Vitals:   04/03/24 0805 04/03/24 0825 04/03/24 0830 04/03/24 0851  BP: (!) 135/90  (!) 140/79   Pulse: 76 73 73   Resp: 20 19 (!) 23   Temp:    98.2 F (36.8 C)  TempSrc:    Oral  SpO2: 97% 100% 100%   Weight:      Height:       Eyes: PERRL, lids and conjunctivae normal ENMT: Mucous membranes are moist. Posterior pharynx clear of any exudate or lesions.Normal dentition.  Neck: normal, supple, no masses, no thyromegaly Respiratory: clear to auscultation bilaterally, no wheezing, fine crackles on bilateral lower fields, increased respiratory effort. No accessory muscle use.  Cardiovascular: Regular rate and rhythm, no murmurs / rubs / gallops.  2+ extremity edema. 2+ pedal pulses. No carotid bruits.  Abdomen: no tenderness, no  masses palpated. No hepatosplenomegaly. Bowel sounds positive.  Musculoskeletal: no clubbing / cyanosis. No joint deformity upper and lower extremities. Good ROM, no contractures. Normal muscle tone.  Skin: no rashes, lesions, ulcers. No induration Neurologic: CN 2-12 grossly intact. Sensation intact, DTR normal. Strength 5/5 in all 4.  Psychiatric: Normal judgment and insight. Alert and oriented x 3. Normal mood.    Labs on Admission: I have personally reviewed following labs and imaging studies  CBC: Recent Labs  Lab 04/03/24 0420  WBC 8.9  NEUTROABS 5.9  HGB 13.9  HCT 43.8  MCV 99.8  PLT 253   Basic Metabolic Panel: Recent Labs  Lab 04/03/24 0420  NA 142  K 3.8  CL 103  CO2 28  GLUCOSE 112*  BUN 45*  CREATININE 1.41*  CALCIUM  8.8*   GFR: Estimated Creatinine Clearance: 54.5 mL/min (A) (by C-G formula based on SCr of 1.41 mg/dL (H)). Liver Function Tests: Recent Labs  Lab 04/03/24 0420  AST 70*  ALT 99*  ALKPHOS 309*  BILITOT 2.1*  PROT 7.1  ALBUMIN 3.2*   Recent Labs  Lab 04/03/24 0420  LIPASE 27   No results for input(s): AMMONIA in the last 168  hours. Coagulation Profile: Recent Labs  Lab 04/03/24 0420  INR 1.3*   Cardiac Enzymes: No results for input(s): CKTOTAL, CKMB, CKMBINDEX, TROPONINI in the last 168 hours. BNP (last 3 results) Recent Labs    08/04/23 1222  PROBNP 630*   HbA1C: No results for input(s): HGBA1C in the last 72 hours. CBG: No results for input(s): GLUCAP in the last 168 hours. Lipid Profile: No results for input(s): CHOL, HDL, LDLCALC, TRIG, CHOLHDL, LDLDIRECT in the last 72 hours. Thyroid  Function Tests: No results for input(s): TSH, T4TOTAL, FREET4, T3FREE, THYROIDAB in the last 72 hours. Anemia Panel: No results for input(s): VITAMINB12, FOLATE, FERRITIN, TIBC, IRON , RETICCTPCT in the last 72 hours. Urine analysis:    Component Value Date/Time   COLORURINE STRAW (A) 04/27/2020 1442   APPEARANCEUR Clear 10/04/2020 1312   LABSPEC 1.006 04/27/2020 1442   PHURINE 5.0 04/27/2020 1442   GLUCOSEU Negative 10/04/2020 1312   HGBUR NEGATIVE 04/27/2020 1442   BILIRUBINUR Negative 10/04/2020 1312   KETONESUR NEGATIVE 04/27/2020 1442   PROTEINUR Negative 10/04/2020 1312   PROTEINUR NEGATIVE 04/27/2020 1442   NITRITE Negative 10/04/2020 1312   NITRITE NEGATIVE 04/27/2020 1442   LEUKOCYTESUR Negative 10/04/2020 1312   LEUKOCYTESUR NEGATIVE 04/27/2020 1442    Radiological Exams on Admission: DG Chest Port 1 View Result Date: 04/03/2024 CLINICAL DATA:  Questionable sepsis.  Check for infiltrate. EXAM: PORTABLE CHEST 1 VIEW COMPARISON:  AP Lat chest 07/10/2023. FINDINGS: The heart is enlarged, unchanged.  No vascular congestion is seen. There is aortic atherosclerosis. Stable mediastinum. No pneumothorax. There is a small left pleural effusion. Overlying atelectasis or consolidation in the left base. Remainder of the lungs are clear. Degenerative change thoracic spine. IMPRESSION: 1. Small left pleural effusion with overlying atelectasis or consolidation in the  left base. 2. Stable cardiomegaly. 3. Aortic atherosclerosis. Electronically Signed   By: Francis Quam M.D.   On: 04/03/2024 05:15    EKG: Independently reviewed.  Sinus, RBBB  Assessment/Plan Principal Problem:   Acute on chronic combined systolic and diastolic CHF, NYHA class 4 (HCC) Active Problems:   CHF (congestive heart failure) (HCC)  (please populate well all problems here in Problem List. (For example, if patient is on BP meds at home and you resume or decide to hold them, it is  a problem that needs to be her. Same for CAD, COPD, HLD and so on)  Acute on chronic combined HFrEF and HFpEF decompensation - Significant fluid overload - Continue IV diuresis Lasix  60 mg twice daily - Hold off Entresto  and Aldactone  for today given there is worsening of kidney function - Start patient on hydralazine  plus Imdur  regimen for BP control - Compared of his EKG showed widening of QRS, recommend outpatient cardiology follow-up for CRT therapy  HTN, emergency - With acute endorgan damage of AKI and CHF decompensation - Management as above, hydralazine  plus Imdur , add a small dose of Coreg   Acute transaminitis Acute bilirubinemia - Probably secondary to CHF decompensation and liver congestion - No RUQ tenderness.  Check RUQ ultrasound  PAF - Resume Eliquis   COPD - Negative for symptoms signs of acute exacerbation  Total time spent on patient care 55 minutes  DVT prophylaxis: Eliquis  Code Status: Full code Family Communication: None at bedside Disposition Plan: Patient is sick with CHF decompensation with significant fluid overload as well as AKI Consults called: None Admission status: Telemetry admission   Cort ONEIDA Mana MD [facility] Pager [phone]  04/03/2024, 9:01 AM

## 2024-04-04 DIAGNOSIS — I5043 Acute on chronic combined systolic (congestive) and diastolic (congestive) heart failure: Secondary | ICD-10-CM | POA: Diagnosis not present

## 2024-04-04 LAB — BASIC METABOLIC PANEL WITH GFR
Anion gap: 9 (ref 5–15)
BUN: 31 mg/dL — ABNORMAL HIGH (ref 8–23)
CO2: 26 mmol/L (ref 22–32)
Calcium: 8 mg/dL — ABNORMAL LOW (ref 8.9–10.3)
Chloride: 107 mmol/L (ref 98–111)
Creatinine, Ser: 0.95 mg/dL (ref 0.61–1.24)
GFR, Estimated: >60 mL/min (ref >60)
Glucose, Bld: 121 mg/dL — ABNORMAL HIGH (ref 70–99)
Potassium: 3.8 mmol/L (ref 3.5–5.1)
Sodium: 142 mmol/L (ref 135–145)

## 2024-04-04 MED ORDER — SACUBITRIL-VALSARTAN 49-51 MG PO TABS
1.0000 | ORAL_TABLET | Freq: Two times a day (BID) | ORAL | Status: DC
  Administered 2024-04-04 – 2024-04-09 (×16): 1 via ORAL
  Filled 2024-04-04 (×10): qty 1

## 2024-04-04 MED ORDER — SPIRONOLACTONE 25 MG PO TABS
25.0000 mg | ORAL_TABLET | Freq: Every day | ORAL | Status: DC
  Administered 2024-04-04 – 2024-04-09 (×9): 25 mg via ORAL
  Filled 2024-04-04 (×6): qty 1

## 2024-04-04 NOTE — Plan of Care (Signed)
  Problem: Activity: Goal: Risk for activity intolerance will decrease Outcome: Progressing   Problem: Nutrition: Goal: Adequate nutrition will be maintained Outcome: Progressing   Problem: Coping: Goal: Level of anxiety will decrease Outcome: Progressing   Problem: Pain Managment: Goal: General experience of comfort will improve and/or be controlled Outcome: Progressing   Problem: Safety: Goal: Ability to remain free from injury will improve Outcome: Progressing

## 2024-04-04 NOTE — Progress Notes (Signed)
 Introduced to role of Statistician. Patient from [facility]. Patient shackled to bed with one handcuff in place. [facility] officer at bedside.   Patient has upcoming disposition court date on 8/20.   Will follow outpatient only if released from jail within 3 month time period.

## 2024-04-04 NOTE — Progress Notes (Addendum)
 Progress Note    Kevin [name]  FMW:[id number] DOB: 1956-03-06  DOA: 04/03/2024 PCP: Pcp, No      Brief Narrative:    Medical records reviewed and are as summarized below:  Kevin [name] is a 68 y.o. 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  He said he is medically adherent.  He has been in [employer] center since March 2025.  He noticed gradual swelling of his legs and feet associated with exertional shortness of breath which has been going on for about a month.  Symptoms progressively worsened in the last 2 days and he could not even lay down flat.  He had increasing cough and shortness of breath so he presented to the ED for further management.  He has poor appetite and poor oral intake as well.  BP in the ED was 185/125 and oxygen saturation was 97% on room air.  Chest x-ray showed cardiomegaly and pulmonary congestion.  Creatinine was 1.4 and BUN 45.       Assessment/Plan:   Principal Problem:   Acute on chronic combined systolic and diastolic CHF, NYHA class 4 (HCC) Active Problems:   CHF (congestive heart failure) (HCC)    Body mass index is 29.35 kg/m.    Acute on chronic combined systolic and diastolic CHF (dilated nonischemic cardiomyopathy), anasarca: Continue IV Lasix .  Monitor BMP, daily weight and urine output. BNP 3,957.9 2D echo November 2024 showed EF estimated at 20 to 25%, indeterminate LV diastolic parameters, moderately dilated left and right atria, mild MR, moderate to severe tricuspid regurgitation, severely dilated pulmonary artery   Hypertensive emergency: BP is better.  Continue antihypertensives    Paroxysmal atrial fibrillation: Continue Eliquis  S/p TEE cardioversion 07/17/2023   AKI: Improved.  Resume Entresto  and Aldactone .   Elevated liver enzymes:  Probably from hepatic congestion from CHF. Liver ultrasound did not show any acute findings.   COPD: Compensated               Diet Order             Diet Heart Room service appropriate? Yes; Fluid consistency: Thin  Diet effective now                            Consultants: None  Procedures: None    Medications:    apixaban   5 mg Oral BID   carvedilol   3.125 mg Oral BID   furosemide   40 mg Intravenous BID   hydrALAZINE   25 mg Oral Q8H   isosorbide  mononitrate  30 mg Oral Daily   sacubitril -valsartan   1 tablet Oral BID   sodium chloride  flush  3 mL Intravenous Q12H   spironolactone   25 mg Oral Daily   tamsulosin   0.4 mg Oral QPC supper   Continuous Infusions:   Anti-infectives (From admission, onward)    None              Family Communication/Anticipated D/C date and plan/Code Status   DVT prophylaxis:  apixaban  (ELIQUIS ) tablet 5 mg     Code Status: Full Code  Family Communication: None Disposition Plan: Plan to discharge to jail   Status is: Inpatient Remains inpatient appropriate because: CHF exacerbation       Subjective:   Interval events noted.  He complains of  swelling of bilateral legs and feet.  No shortness of breath or chest pain.  Prison guard at the bedside.  Objective:    Vitals:   04/04/24 0500 04/04/24 0649 04/04/24 0806 04/04/24 1203  BP:  (!) 163/119 (!) 182/112 (!) 143/96  Pulse:   85   Resp:      Temp:   97.7 F (36.5 C) 97.7 F (36.5 C)  TempSrc:   Oral Oral  SpO2:   97% 99%  Weight: 85 kg     Height:       No data found.   Intake/Output Summary (Last 24 hours) at 04/04/2024 1431 Last data filed at 04/04/2024 1058 Gross per 24 hour  Intake 180 ml  Output 1750 ml  Net -1570 ml   Filed Weights   04/03/24 0411 04/04/24 0500  Weight: 90.3 kg 85 kg    Exam:  GEN: NAD SKIN: Warm and dry.  Unroofed blister wounds on dorsal aspect of bilateral feet EYES: No pallor or  icterus ENT: MMM CV: RRR PULM: CTA B ABD: soft, distended, NT, +BS CNS: AAO x 3, non focal EXT: Significant bilateral lower extremity edema from the thighs to the feet.        Data Reviewed:   I have personally reviewed following labs and imaging studies:  Labs: Labs show the following:   Basic Metabolic Panel: Recent Labs  Lab 04/03/24 0420 04/04/24 0457  NA 142 142  K 3.8 3.8  CL 103 107  CO2 28 26  GLUCOSE 112* 121*  BUN 45* 31*  CREATININE 1.41* 0.95  CALCIUM  8.8* 8.0*   GFR Estimated Creatinine Clearance: 78.7 mL/min (by C-G formula based on SCr of 0.95 mg/dL). Liver Function Tests: Recent Labs  Lab 04/03/24 0420  AST 70*  ALT 99*  ALKPHOS 309*  BILITOT 2.1*  PROT 7.1  ALBUMIN 3.2*   Recent Labs  Lab 04/03/24 0420  LIPASE 27   No results for input(s): AMMONIA in the last 168 hours. Coagulation profile Recent Labs  Lab 04/03/24 0420  INR 1.3*    CBC: Recent Labs  Lab 04/03/24 0420  WBC 8.9  NEUTROABS 5.9  HGB 13.9  HCT 43.8  MCV 99.8  PLT 253   Cardiac Enzymes: No results for input(s): CKTOTAL, CKMB, CKMBINDEX, TROPONINI in the last 168 hours. BNP (last 3 results) Recent Labs    08/04/23 1222  PROBNP 630*   CBG: No results for input(s): GLUCAP in the last 168 hours. D-Dimer: No results for input(s): DDIMER in the last 72 hours. Hgb A1c: No results for input(s): HGBA1C in the last 72 hours. Lipid Profile: No results for input(s): CHOL, HDL, LDLCALC, TRIG, CHOLHDL, LDLDIRECT in the last 72 hours. Thyroid  function studies: No results for input(s): TSH, T4TOTAL, T3FREE, THYROIDAB in the last 72 hours.  Invalid input(s): FREET3 Anemia work up: No results for input(s): VITAMINB12, FOLATE, FERRITIN, TIBC, IRON , RETICCTPCT in the last 72 hours. Sepsis Labs: Recent Labs  Lab 04/03/24 0420  WBC 8.9  LATICACIDVEN 1.7    Microbiology Recent Results (from the past 240 hours)   Blood culture (routine single)     Status: None (Preliminary result)   Collection Time: 04/03/24  4:20 AM   Specimen: BLOOD  Result Value Ref Range Status   Specimen Description BLOOD BLOOD LEFT ARM  Final   Special Requests   Final    BOTTLES DRAWN AEROBIC AND ANAEROBIC Blood Culture adequate volume   Culture   Final    NO GROWTH <  24 HOURS Performed at [facility], [address] [place], [place] [address]    Report Status PENDING  Incomplete    Procedures and diagnostic studies:  US  Abdomen Limited RUQ (LIVER/GB) Result Date: 04/03/2024 CLINICAL DATA:  Abnormal liver function tests. EXAM: ULTRASOUND ABDOMEN LIMITED RIGHT UPPER QUADRANT COMPARISON:  CT abdomen pelvis 02/28/2023. FINDINGS: Gallbladder: Wall thickening, 5 mm. No gallstones, sonographic Murphy sign or pericholecystic fluid. Common bile duct: Diameter: 3 mm, within normal limits. No intrahepatic biliary ductal dilatation. Liver: Diffusely increased in echogenicity. No definite focal lesion. Portal vein is patent on color Doppler imaging with normal direction of blood flow towards the liver. Other: None. IMPRESSION: 1. No acute findings. 2. Gallbladder wall thickening be seen with hypoproteinemia or other stomach etiologies. Electronically Signed   By: Newell Eke M.D.   On: 04/03/2024 15:06   DG Chest Port 1 View Result Date: 04/03/2024 CLINICAL DATA:  Questionable sepsis.  Check for infiltrate. EXAM: PORTABLE CHEST 1 VIEW COMPARISON:  AP Lat chest 07/10/2023. FINDINGS: The heart is enlarged, unchanged.  No vascular congestion is seen. There is aortic atherosclerosis. Stable mediastinum. No pneumothorax. There is a small left pleural effusion. Overlying atelectasis or consolidation in the left base. Remainder of the lungs are clear. Degenerative change thoracic spine. IMPRESSION: 1. Small left pleural effusion with overlying atelectasis or consolidation in the left base. 2. Stable cardiomegaly. 3. Aortic  atherosclerosis. Electronically Signed   By: Francis Quam M.D.   On: 04/03/2024 05:15               LOS: 1 day   Maleia Weems  [facility]   Pager on [email or web address]. If 7PM-7AM, please contact night-coverage at [email or web address]     04/04/2024, 2:31 PM

## 2024-04-05 ENCOUNTER — Encounter: Payer: Self-pay | Admitting: Internal Medicine

## 2024-04-05 DIAGNOSIS — I5043 Acute on chronic combined systolic (congestive) and diastolic (congestive) heart failure: Secondary | ICD-10-CM | POA: Diagnosis not present

## 2024-04-05 LAB — BASIC METABOLIC PANEL WITH GFR
Anion gap: 5 (ref 5–15)
BUN: 24 mg/dL — ABNORMAL HIGH (ref 8–23)
CO2: 31 mmol/L (ref 22–32)
Calcium: 8.2 mg/dL — ABNORMAL LOW (ref 8.9–10.3)
Chloride: 102 mmol/L (ref 98–111)
Creatinine, Ser: 0.97 mg/dL (ref 0.61–1.24)
GFR, Estimated: >60 mL/min (ref >60)
Glucose, Bld: 129 mg/dL — ABNORMAL HIGH (ref 70–99)
Potassium: 3.9 mmol/L (ref 3.5–5.1)
Sodium: 138 mmol/L (ref 135–145)

## 2024-04-05 MED ORDER — FUROSEMIDE 10 MG/ML IJ SOLN
4.0000 mg/h | INTRAVENOUS | Status: DC
  Administered 2024-04-05 (×2): 8 mg/h via INTRAVENOUS
  Administered 2024-04-06 – 2024-04-08 (×3): 4 mg/h via INTRAVENOUS
  Filled 2024-04-05 (×3): qty 20

## 2024-04-05 NOTE — Progress Notes (Signed)
 Heart Failure Navigator Progress Note Patient is currently a Heart Failure Team patient of Ellouise Class, FNP.  Visited with the patient in his room 259 to provide a hospital follow-up appointment and review Heart Failure Education with him.  [facility] present at the bedside during interview and education review.   Education Assessment and Provision: Detailed education and instructions provided on heart failure disease management including the following:  Signs and symptoms of Heart Failure When to call the physician Importance of daily weights Low sodium diet Fluid restriction Medication management Anticipated future follow-up appointments  Patient education given on each of the above topics.  Patient acknowledges understanding via teach back method and acceptance of all instructions.  Education Materials:  Living Better With Heart Failure Booklet, HF zone tool, & Daily Weight Tracker Tool given to the officer with follow-up appointment details.  Patient has scale at home: Patient currently at the [facility].  Patient reports he gets weighed once a week.   Patient has pill box at home: Patient medications provided to him at the [facility]  Patient reports he does take his prescribed medications there.  Hospital follow-up at the Heart Failure Clinic @ [facility] 04/12/2024 @  9:00 AM  Charmaine Pines, RN, BSN [facility] Heart Failure Navigator Secure Chat Only

## 2024-04-05 NOTE — Discharge Instructions (Addendum)
 Some PCP options in [place] area- not a comprehensive list  [facility]- [phone] [facility]- [phone] [facility]- [phone] [facility]- [phone] [facility]- [phone] Nichole Molly- [phone]  or [facility] Physician Referral Line [phone]     Transportation Resources for [facility] Area  Agency Name: [name] Agency Address: [address] [place], [place] [address] Phone: [phone] Email: [email or web address] Website: [email or web address] Service(s) Offered: Housing services, self-sufficiency, congregate meal program, weatherization program, Field seismologist program, emergency food assistance,  housing counseling, home ownership program, wheels-towork program.  Agency Name: [facility] [employer] [phone]) Address: [address] [place], [place] [address] Phone: [phone] Website: [email or web address] Service(s) Offered: Transportation for [employer], subscription and demand response; Dial-a-Ride for citizens 57 years of age or older.  Agency Name: Department of Social Services Address: [address] [place], [place] [address] Phone: [phone] Service(s) Offered: Child support services; child welfare services; food stamps; Medicaid; work first family assistance; and aid with fuel,  rent, food and medicine, transportation assistance.  Agency Name: Disabled [employer] ([employer]) Transportation  Network Phone: [phone] Service(s) Offered: Transports veterans to the [facility] medical center. Call  forty-eight hours in advance and leave the name, telephone  number, date, and time of appointment. Veteran will be  contacted by the driver the day before the appointment to  arrange a pick up point    [employer] currently provides door to door services. ACTA connects with PART daily for  services to [facility]. ACTA also performs contract services to [employer] operates 27 vehicles, all but 3 mini-vans are equipped with lifts for special needs as well as the general public. [employer] drivers are each CDL certified and trained in First Aid and CPR. [employer] was established in 2002 by [employer]. An independent Industrial/product designer. ACTA operates via Cytogeneticist with required local 10% match funding from [place]. [employer] provides over 80,000 passenger trips each year, including [facility] and [employer] sites.  Call at least by 11 AM one business day prior to needing transportation  [employer].                      [place], [place] [address]     Office Hours: Monday-Friday  8 AM - 5 PM

## 2024-04-05 NOTE — TOC CM/SW Note (Signed)
 Transition of Care [facility]) - Inpatient Brief Assessment   Patient Details  Name: Kevin [name] MRN: [id number] Date of Birth: Mar 06, 1956  Transition of Care [facility]) CM/SW Contact:    Lauraine JAYSON Carpen, LCSW Phone Number: 04/05/2024, 2:38 PM   Clinical Narrative: CSW reviewed chart. SDOH flag for transportation. No PCP. CSW added resources to AVS. No other TOC needs identified so far. CSW will continue to follow progress. Please place [facility] consult if any needs arise.  Transition of Care Asessment: Insurance and Status: Insurance coverage has been reviewed Patient has primary care physician: No Home environment has been reviewed: Single family home Prior level of function:: Not documented Prior/Current Home Services: No current home services Social Drivers of Health Review: SDOH reviewed interventions complete Readmission risk has been reviewed: Yes Transition of care needs: no transition of care needs at this time

## 2024-04-05 NOTE — Plan of Care (Signed)

## 2024-04-05 NOTE — Progress Notes (Addendum)
 Progress Note    Kevin [name]  FMW:[id number] DOB: [birth year]  DOA: 04/03/2024 PCP: Pcp, No      Brief Narrative:    Medical records reviewed and are as summarized below:  Kevin [name] is a 68 y.o. 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  He said he is medically adherent.  He has been in [employer] center since March 2025.  He noticed gradual swelling of his legs and feet associated with exertional shortness of breath which has been going on for about a month.  Symptoms progressively worsened in the last 2 days and he could not even lay down flat.  He had increasing cough and shortness of breath so he presented to the ED for further management.  He has poor appetite and poor oral intake as well.  BP in the ED was 185/125 and oxygen saturation was 97% on room air.  Chest x-ray showed cardiomegaly and pulmonary congestion.  Creatinine was 1.4 and BUN 45.       Assessment/Plan:   Principal Problem:   Acute on chronic combined systolic and diastolic CHF, NYHA class 4 (HCC) Active Problems:   CHF (congestive heart failure) (HCC)    Body mass index is 29.35 kg/m.    Acute on chronic combined systolic and diastolic CHF (dilated nonischemic cardiomyopathy), anasarca: Change IV Lasix  boluses to IV Lasix  infusion at 8 mg/h.  Continue carvedilol , hydralazine , Imdur , Entresto  and spironolactone .  Monitor BMP, daily weight and urine output. BNP 3,957.9 2D echo November 2024 showed EF estimated at 20 to 25%, indeterminate LV diastolic parameters, moderately dilated left and right atria, mild MR, moderate to severe tricuspid regurgitation, severely dilated pulmonary artery   Hypertensive emergency: BP is better.  Continue antihypertensives    Paroxysmal atrial fibrillation: Continue Eliquis  and  carvedilol  S/p TEE cardioversion 07/17/2023   NSVT on telemetry on 04/04/2024: Continue carvedilol    AKI: Improved.    Elevated liver enzymes: Probably from hepatic congestion from CHF. Liver ultrasound did not show any acute findings.   COPD: Compensated               Diet Order             Diet Heart Room service appropriate? Yes; Fluid consistency: Thin  Diet effective now                            Consultants: None  Procedures: None    Medications:    apixaban   5 mg Oral BID   carvedilol   3.125 mg Oral BID   hydrALAZINE   25 mg Oral Q8H   isosorbide  mononitrate  30 mg Oral Daily   sacubitril -valsartan   1 tablet Oral BID   sodium chloride  flush  3 mL Intravenous Q12H   spironolactone   25 mg Oral Daily   tamsulosin   0.4 mg Oral QPC supper   Continuous Infusions:  furosemide  (LASIX ) 200 mg in dextrose  5 % 100 mL (2 mg/mL) infusion 8 mg/hr (04/05/24 1207)     Anti-infectives (From admission, onward)    None              Family Communication/Anticipated D/C date and plan/Code Status   DVT prophylaxis:  apixaban  (ELIQUIS ) tablet 5 mg     Code Status: Full Code  Family Communication:  None Disposition Plan: Plan to discharge to jail   Status is: Inpatient Remains inpatient appropriate because: CHF exacerbation       Subjective:   Interval events noted.  He still has significant swelling in bilateral legs and feet.  No shortness of breath or chest pain.  [facility] officer at the bedside.  Objective:    Vitals:   04/05/24 0320 04/05/24 0448 04/05/24 0753 04/05/24 1200  BP: (!) 152/100  (!) 161/103 (!) 147/94  Pulse: 80  78 70  Resp: 18  18 20   Temp: (!) 97.1 F (36.2 C)  (!) 97.5 F (36.4 C) 97.6 F (36.4 C)  TempSrc:      SpO2: 100%  100% 100%  Weight:  85 kg    Height:       No data found.   Intake/Output Summary (Last 24 hours) at 04/05/2024 1413 Last data filed at 04/05/2024 1338 Gross per 24  hour  Intake 120 ml  Output 4100 ml  Net -3980 ml   Filed Weights   04/03/24 0411 04/04/24 0500 04/05/24 0448  Weight: 90.3 kg 85 kg 85 kg    Exam:  GEN: NAD, left hand handcuffed to the bed SKIN: Unroofed blister wound on the dorsal aspect of bilateral feet EYES: No pallor or icterus ENT: MMM CV: RRR PULM: CTA B ABD: soft, ND, NT, +BS CNS: AAO x 3, non focal EXT: Significant bilateral lower extremity edema from the thighs to the feet.       Data Reviewed:   I have personally reviewed following labs and imaging studies:  Labs: Labs show the following:   Basic Metabolic Panel: Recent Labs  Lab 04/03/24 0420 04/04/24 0457 04/05/24 0612  NA 142 142 138  K 3.8 3.8 3.9  CL 103 107 102  CO2 28 26 31   GLUCOSE 112* 121* 129*  BUN 45* 31* 24*  CREATININE 1.41* 0.95 0.97  CALCIUM  8.8* 8.0* 8.2*   GFR Estimated Creatinine Clearance: 77 mL/min (by C-G formula based on SCr of 0.97 mg/dL). Liver Function Tests: Recent Labs  Lab 04/03/24 0420  AST 70*  ALT 99*  ALKPHOS 309*  BILITOT 2.1*  PROT 7.1  ALBUMIN 3.2*   Recent Labs  Lab 04/03/24 0420  LIPASE 27   No results for input(s): AMMONIA in the last 168 hours. Coagulation profile Recent Labs  Lab 04/03/24 0420  INR 1.3*    CBC: Recent Labs  Lab 04/03/24 0420  WBC 8.9  NEUTROABS 5.9  HGB 13.9  HCT 43.8  MCV 99.8  PLT 253   Cardiac Enzymes: No results for input(s): CKTOTAL, CKMB, CKMBINDEX, TROPONINI in the last 168 hours. BNP (last 3 results) Recent Labs    08/04/23 1222  PROBNP 630*   CBG: No results for input(s): GLUCAP in the last 168 hours. D-Dimer: No results for input(s): DDIMER in the last 72 hours. Hgb A1c: No results for input(s): HGBA1C in the last 72 hours. Lipid Profile: No results for input(s): CHOL, HDL, LDLCALC, TRIG, CHOLHDL, LDLDIRECT in the last 72 hours. Thyroid  function studies: No results for input(s): TSH, T4TOTAL, T3FREE,  THYROIDAB in the last 72 hours.  Invalid input(s): FREET3 Anemia work up: No results for input(s): VITAMINB12, FOLATE, FERRITIN, TIBC, IRON , RETICCTPCT in the last 72 hours. Sepsis Labs: Recent Labs  Lab 04/03/24 0420  WBC 8.9  LATICACIDVEN 1.7    Microbiology Recent Results (from the past 240 hours)  Blood culture (routine single)     Status: None (Preliminary result)  Collection Time: 04/03/24  4:20 AM   Specimen: BLOOD  Result Value Ref Range Status   Specimen Description BLOOD BLOOD LEFT ARM  Final   Special Requests   Final    BOTTLES DRAWN AEROBIC AND ANAEROBIC Blood Culture adequate volume   Culture   Final    NO GROWTH 2 DAYS Performed at [facility], [address], [place], [place] [address]    Report Status PENDING  Incomplete    Procedures and diagnostic studies:  No results found.              LOS: 2 days   Kevin [name]  [facility]   Pager on [email or web address]. If 7PM-7AM, please contact night-coverage at [email or web address]     04/05/2024, 2:13 PM

## 2024-04-06 DIAGNOSIS — I5043 Acute on chronic combined systolic (congestive) and diastolic (congestive) heart failure: Secondary | ICD-10-CM | POA: Diagnosis not present

## 2024-04-06 LAB — BASIC METABOLIC PANEL WITH GFR
Anion gap: 11 (ref 5–15)
BUN: 24 mg/dL — ABNORMAL HIGH (ref 8–23)
CO2: 32 mmol/L (ref 22–32)
Calcium: 8.3 mg/dL — ABNORMAL LOW (ref 8.9–10.3)
Chloride: 93 mmol/L — ABNORMAL LOW (ref 98–111)
Creatinine, Ser: 1.11 mg/dL (ref 0.61–1.24)
GFR, Estimated: >60 mL/min (ref >60)
Glucose, Bld: 96 mg/dL (ref 70–99)
Potassium: 3.9 mmol/L (ref 3.5–5.1)
Sodium: 136 mmol/L (ref 135–145)

## 2024-04-06 LAB — HEPATIC FUNCTION PANEL
ALT: 56 U/L — ABNORMAL HIGH (ref 0–44)
AST: 45 U/L — ABNORMAL HIGH (ref 15–41)
Albumin: 2.6 g/dL — ABNORMAL LOW (ref 3.5–5.0)
Alkaline Phosphatase: 186 U/L — ABNORMAL HIGH (ref 38–126)
Bilirubin, Direct: 0.5 mg/dL — ABNORMAL HIGH (ref 0.0–0.2)
Indirect Bilirubin: 1 mg/dL — ABNORMAL HIGH (ref 0.3–0.9)
Total Bilirubin: 1.5 mg/dL — ABNORMAL HIGH (ref 0.0–1.2)
Total Protein: 6.2 g/dL — ABNORMAL LOW (ref 6.5–8.1)

## 2024-04-06 LAB — PHOSPHORUS: Phosphorus: 3 mg/dL (ref 2.5–4.6)

## 2024-04-06 LAB — IRON AND TIBC
Iron: 21 ug/dL — ABNORMAL LOW (ref 45–182)
Saturation Ratios: 6 % — ABNORMAL LOW (ref 17.9–39.5)
TIBC: 358 ug/dL (ref 250–450)
UIBC: 337 ug/dL

## 2024-04-06 LAB — VITAMIN D 25 HYDROXY (VIT D DEFICIENCY, FRACTURES): Vit D, 25-Hydroxy: 18.46 ng/mL — ABNORMAL LOW (ref 30–100)

## 2024-04-06 LAB — MAGNESIUM: Magnesium: 1.9 mg/dL (ref 1.7–2.4)

## 2024-04-06 MED ORDER — ORAL CARE MOUTH RINSE: 15.0000 mL | OROMUCOSAL | Status: DC | PRN

## 2024-04-06 NOTE — Plan of Care (Signed)

## 2024-04-06 NOTE — Progress Notes (Signed)
 Progress Note    Kevin [name]  FMW:[id number] DOB: 08/01/1956  DOA: 04/03/2024 PCP: Pcp, No   Brief Narrative:    Medical records reviewed and are as summarized below:  Kevin [name] is a 68 y.o. 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  He said he is medically adherent.  He has been in [employer] center since March 2025.  He noticed gradual swelling of his legs and feet associated with exertional shortness of breath which has been going on for about a month.  Symptoms progressively worsened in the last 2 days and he could not even lay down flat.  He had increasing cough and shortness of breath so he presented to the ED for further management.  He has poor appetite and poor oral intake as well.  BP in the ED was 185/125 and oxygen saturation was 97% on room air.  Chest x-ray showed cardiomegaly and pulmonary congestion.  Creatinine was 1.4 and BUN 45.     Assessment/Plan:   Principal Problem:   Acute on chronic combined systolic and diastolic CHF, NYHA class 4 (HCC) Active Problems:   CHF (congestive heart failure) (HCC)   Acute on chronic combined systolic and diastolic CHF (dilated nonischemic cardiomyopathy), anasarca S/p IV Lasix  boluses and s/p IV Lasix  infusion at 8 mg/h>> infusion rate was decreased to 4 mg/h Continue carvedilol , hydralazine , Imdur , Entresto  and spironolactone .   Monitor BMP, daily weight and urine output. BNP 3,957.9 2D echo November 2024 showed EF estimated at 20 to 25%, indeterminate LV diastolic parameters, moderately dilated left and right atria, mild MR, moderate to severe tricuspid regurgitation, severely dilated pulmonary artery    Hypertensive emergency: BP is better.  Continue antihypertensives    Paroxysmal atrial fibrillation: Continue Eliquis  and  carvedilol  S/p TEE cardioversion 07/17/2023   NSVT on telemetry on 04/04/2024: Continue carvedilol    AKI: Improved.    Elevated liver enzymes: Probably from hepatic congestion from CHF. Liver ultrasound did not show any acute findings.   COPD: Compensated   Body mass index is 27.52 kg/m.    Diet Order             Diet Heart Room service appropriate? Yes; Fluid consistency: Thin  Diet effective now                     Consultants: None  Procedures: None    Medications:    apixaban   5 mg Oral BID   carvedilol   3.125 mg Oral BID   hydrALAZINE   25 mg Oral Q8H   isosorbide  mononitrate  30 mg Oral Daily   sacubitril -valsartan   1 tablet Oral BID   sodium chloride  flush  3 mL Intravenous Q12H   spironolactone   25 mg Oral Daily   tamsulosin   0.4 mg Oral QPC supper   Continuous Infusions:  furosemide  (LASIX ) 200 mg in dextrose  5 % 100 mL (2 mg/mL) infusion 4 mg/hr (04/06/24 1330)     Anti-infectives (From admission, onward)    None          Family Communication/Anticipated D/C date and plan/Code Status   DVT prophylaxis:  apixaban  (ELIQUIS ) tablet 5 mg     Code Status: Full Code  Family Communication: None Disposition Plan: Plan to discharge to jail   Status is: Inpatient Remains inpatient appropriate because: CHF exacerbation  Subjective:   No significant events overnight, patient is making enough urine, still has significant lower extremity edema. Denied any complaints  Objective:    Vitals:   04/06/24 0500 04/06/24 0827 04/06/24 1146 04/06/24 1525  BP:  119/78 117/78 129/77  Pulse:  74  78  Resp:      Temp:  98.2 F (36.8 C) 98.1 F (36.7 C) 97.8 F (36.6 C)  TempSrc:  Oral Oral Oral  SpO2:  96% 98% 99%  Weight: 79.7 kg     Height:       No data found.   Intake/Output Summary (Last 24 hours) at 04/06/2024 1748 Last data filed at 04/06/2024 1644 Gross per 24 hour  Intake 1874.8 ml  Output 4475 ml  Net  -2600.2 ml   Filed Weights   04/04/24 0500 04/05/24 0448 04/06/24 0500  Weight: 85 kg 85 kg 79.7 kg    Exam:  GEN: NAD, left hand handcuffed to the bed SKIN: Unroofed blister wound on the dorsal aspect of bilateral feet EYES: No pallor or icterus ENT: MMM CV: RRR PULM: CTA B ABD: soft, ND, NT, +BS CNS: AAO x 3, non focal EXT: 4+ bilateral lower extremity edema, below knee     Data Reviewed:   I have personally reviewed following labs and imaging studies:  Labs: Labs show the following:   Basic Metabolic Panel: Recent Labs  Lab 04/03/24 0420 04/04/24 0457 04/05/24 0612 04/06/24 0328  NA 142 142 138 136  K 3.8 3.8 3.9 3.9  CL 103 107 102 93*  CO2 28 26 31  32  GLUCOSE 112* 121* 129* 96  BUN 45* 31* 24* 24*  CREATININE 1.41* 0.95 0.97 1.11  CALCIUM  8.8* 8.0* 8.2* 8.3*  MG  --   --   --  1.9  PHOS  --   --   --  3.0   GFR Estimated Creatinine Clearance: 65.3 mL/min (by C-G formula based on SCr of 1.11 mg/dL). Liver Function Tests: Recent Labs  Lab 04/03/24 0420 04/06/24 0328  AST 70* 45*  ALT 99* 56*  ALKPHOS 309* 186*  BILITOT 2.1* 1.5*  PROT 7.1 6.2*  ALBUMIN 3.2* 2.6*   Recent Labs  Lab 04/03/24 0420  LIPASE 27   No results for input(s): AMMONIA in the last 168 hours. Coagulation profile Recent Labs  Lab 04/03/24 0420  INR 1.3*    CBC: Recent Labs  Lab 04/03/24 0420  WBC 8.9  NEUTROABS 5.9  HGB 13.9  HCT 43.8  MCV 99.8  PLT 253   Cardiac Enzymes: No results for input(s): CKTOTAL, CKMB, CKMBINDEX, TROPONINI in the last 168 hours. BNP (last 3 results) Recent Labs    08/04/23 1222  PROBNP 630*   CBG: No results for input(s): GLUCAP in the last 168 hours. D-Dimer: No results for input(s): DDIMER in the last 72 hours. Hgb A1c: No results for input(s): HGBA1C in the last 72 hours. Lipid Profile: No results for input(s): CHOL, HDL, LDLCALC, TRIG, CHOLHDL, LDLDIRECT in the last 72 hours. Thyroid   function studies: No results for input(s): TSH, T4TOTAL, T3FREE, THYROIDAB in the last 72 hours.  Invalid input(s): FREET3 Anemia work up: Recent Labs    04/06/24 0328  TIBC 358  IRON  21*   Sepsis Labs: Recent Labs  Lab 04/03/24 0420  WBC 8.9  LATICACIDVEN 1.7    Microbiology Recent Results (from the past 240 hours)  Blood culture (routine single)     Status: None (Preliminary result)   Collection Time: 04/03/24  4:20 AM   Specimen: BLOOD  Result Value Ref Range Status   Specimen Description BLOOD BLOOD LEFT ARM  Final   Special Requests   Final    BOTTLES DRAWN AEROBIC AND ANAEROBIC Blood Culture adequate volume   Culture   Final    NO GROWTH 2 DAYS Performed at [facility], [address] [place], [place] [address]    Report Status PENDING  Incomplete    Procedures and diagnostic studies:  No results found.     LOS: 3 days   Generoso Cropper Von  [facility]   Pager on [email or web address]. If 7PM-7AM, please contact night-coverage at [email or web address]     04/06/2024, 5:48 PM

## 2024-04-06 NOTE — Consult Note (Addendum)
 WOC Nurse Consult Note: patient has been seen in the past (06/2023) for wounds to dorsal feet from edema  Reason for Consult: wounds dorsal feet  Wound type:partial thickness skin loss B dorsal feet likely r/t edema (appear to be ruptured bullae)  Pressure Injury POA: NA  Measurement: see nursing flowsheet  Wound bed: pink clean  Drainage (amount, consistency, odor) weeping per nursing  Periwound: edematous  Dressing procedure/placement/frequency: Cleanse bilateral dorsal foot wounds with NS, apply Xeroform gauze (Lawson [id number]) to wound beds every other day and secure with silicone foam or Kerlix roll guaze whichever patient prefers/can tolerate.    POC discussed with bedside nurse. WOC team will not follow. Re-consult if further needs arise.   Thank you,    Powell Bar MSN, RN-BC, [employer]

## 2024-04-07 DIAGNOSIS — I5043 Acute on chronic combined systolic (congestive) and diastolic (congestive) heart failure: Secondary | ICD-10-CM | POA: Diagnosis not present

## 2024-04-07 LAB — CBC
HCT: 41 % (ref 39.0–52.0)
Hemoglobin: 13.9 g/dL (ref 13.0–17.0)
MCH: 31.6 pg (ref 26.0–34.0)
MCHC: 33.9 g/dL (ref 30.0–36.0)
MCV: 93.2 fL (ref 80.0–100.0)
Platelets: 265 K/uL (ref 150–400)
RBC: 4.4 MIL/uL (ref 4.22–5.81)
RDW: 14 % (ref 11.5–15.5)
WBC: 13.7 K/uL — ABNORMAL HIGH (ref 4.0–10.5)
nRBC: 0 % (ref 0.0–0.2)

## 2024-04-07 LAB — HEPATIC FUNCTION PANEL
ALT: 50 U/L — ABNORMAL HIGH (ref 0–44)
AST: 42 U/L — ABNORMAL HIGH (ref 15–41)
Albumin: 2.7 g/dL — ABNORMAL LOW (ref 3.5–5.0)
Alkaline Phosphatase: 163 U/L — ABNORMAL HIGH (ref 38–126)
Bilirubin, Direct: 0.4 mg/dL — ABNORMAL HIGH (ref 0.0–0.2)
Indirect Bilirubin: 0.9 mg/dL (ref 0.3–0.9)
Total Bilirubin: 1.3 mg/dL — ABNORMAL HIGH (ref 0.0–1.2)
Total Protein: 6.4 g/dL — ABNORMAL LOW (ref 6.5–8.1)

## 2024-04-07 LAB — BASIC METABOLIC PANEL WITH GFR
Anion gap: 9 (ref 5–15)
BUN: 24 mg/dL — ABNORMAL HIGH (ref 8–23)
CO2: 30 mmol/L (ref 22–32)
Calcium: 8.3 mg/dL — ABNORMAL LOW (ref 8.9–10.3)
Chloride: 94 mmol/L — ABNORMAL LOW (ref 98–111)
Creatinine, Ser: 1.11 mg/dL (ref 0.61–1.24)
GFR, Estimated: >60 mL/min (ref >60)
Glucose, Bld: 135 mg/dL — ABNORMAL HIGH (ref 70–99)
Potassium: 3.9 mmol/L (ref 3.5–5.1)
Sodium: 133 mmol/L — ABNORMAL LOW (ref 135–145)

## 2024-04-07 LAB — MAGNESIUM: Magnesium: 1.9 mg/dL (ref 1.7–2.4)

## 2024-04-07 LAB — PHOSPHORUS: Phosphorus: 3 mg/dL (ref 2.5–4.6)

## 2024-04-07 LAB — PROCALCITONIN: Procalcitonin: <0.1 ng/mL

## 2024-04-07 MED ORDER — IRON SUCROSE 300 MG IVPB - SIMPLE MED
300.0000 mg | Freq: Once | Status: AC
  Administered 2024-04-07 (×2): 300 mg via INTRAVENOUS
  Filled 2024-04-07: qty 300

## 2024-04-07 MED ORDER — MELATONIN 5 MG PO TABS
5.0000 mg | ORAL_TABLET | Freq: Every evening | ORAL | Status: DC | PRN
  Administered 2024-04-07 – 2024-04-08 (×3): 5 mg via ORAL
  Filled 2024-04-07 (×2): qty 1

## 2024-04-07 MED ORDER — VITAMIN D (ERGOCALCIFEROL) 1.25 MG (50000 UNIT) PO CAPS
50000.0000 [IU] | ORAL_CAPSULE | ORAL | Status: DC
  Administered 2024-04-07 (×2): 50000 [IU] via ORAL
  Filled 2024-04-07: qty 1

## 2024-04-07 MED ORDER — AMOXICILLIN-POT CLAVULANATE 875-125 MG PO TABS: 1.0000 | ORAL_TABLET | Freq: Two times a day (BID) | ORAL | Status: DC

## 2024-04-07 NOTE — Progress Notes (Signed)
 Progress Note    Kevin [name]  FMW:[id number] DOB: 25-Jun-1956  DOA: 04/03/2024 PCP: Pcp, No   Brief Narrative:    Medical records reviewed and are as summarized below:  Kevin [name] is a 68 y.o. 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  He said he is medically adherent.  He has been in [facility] center since March 2025.  He noticed gradual swelling of his legs and feet associated with exertional shortness of breath which has been going on for about a month.  Symptoms progressively worsened in the last 2 days and he could not even lay down flat.  He had increasing cough and shortness of breath so he presented to the ED for further management.  He has poor appetite and poor oral intake as well.  BP in the ED was 185/125 and oxygen saturation was 97% on room air.  Chest x-ray showed cardiomegaly and pulmonary congestion.  Creatinine was 1.4 and BUN 45.     Assessment/Plan:   Principal Problem:   Acute on chronic combined systolic and diastolic CHF, NYHA class 4 (HCC) Active Problems:   CHF (congestive heart failure) (HCC)   Acute on chronic combined systolic and diastolic CHF (dilated nonischemic cardiomyopathy), anasarca S/p IV Lasix  boluses and s/p IV Lasix  infusion at 8 mg/h>> infusion rate was decreased to 4 mg/h Continue carvedilol , hydralazine , Imdur , Entresto  and spironolactone .   Monitor BMP, daily weight and urine output. BNP 3,957.9 2D echo November 2024 showed EF estimated at 20 to 25%, indeterminate LV diastolic parameters, moderately dilated left and right atria, mild MR, moderate to severe tricuspid regurgitation, severely dilated pulmonary artery 8/13 Ace wrap applied to bilateral lower extremities, edema is improving   Leukocytosis, most likely reactive, procalcitonin  negative. Monitor without antibiotics  Hypertensive emergency: BP is better.  Continue antihypertensives   Paroxysmal atrial fibrillation: Continue Eliquis  and carvedilol  S/p TEE cardioversion 07/17/2023   NSVT on telemetry on 04/04/2024: Continue carvedilol    AKI: Improved.    Elevated liver enzymes: Probably from hepatic congestion from CHF. Liver ultrasound did not show any acute findings.   COPD: Compensated  # Iron  deficiency, Tsat 6%, Venofer  300 mg IV one-time dose given.  Patient will benefit from oral iron  supplement with vitamin C  on discharge.  Follow-up PCP to repeat iron  profile after 3 to 6 months  # Vitamin D  deficiency: started vitamin D  50,000 units p.o. weekly, follow with PCP to repeat vitamin D  level after 3 to 6 months.   Body mass index is 27.52 kg/m.    Diet Order             Diet Heart Room service appropriate? Yes; Fluid consistency: Thin  Diet effective now                   Consultants: None  Procedures: None    Medications:    apixaban   5 mg Oral BID   carvedilol   3.125 mg Oral BID   hydrALAZINE   25 mg Oral Q8H   isosorbide  mononitrate  30 mg Oral Daily   sacubitril -valsartan   1 tablet Oral BID   sodium chloride  flush  3 mL Intravenous Q12H   spironolactone   25 mg Oral Daily   tamsulosin   0.4 mg Oral QPC supper   Vitamin D  (Ergocalciferol )  50,000 Units Oral Q7 days  Continuous Infusions:  furosemide  (LASIX ) 200 mg in dextrose  5 % 100 mL (2 mg/mL) infusion 4 mg/hr (04/07/24 0754)     Anti-infectives (From admission, onward)    Start     Dose/Rate Route Frequency Ordered Stop   04/07/24 1315  amoxicillin -clavulanate (AUGMENTIN ) 875-125 MG per tablet 1 tablet  Status:  Discontinued        1 tablet Oral 2 times daily 04/07/24 1217 04/07/24 1218          Family Communication/Anticipated D/C date and plan/Code Status   DVT prophylaxis:  apixaban  (ELIQUIS ) tablet 5 mg     Code Status: Full Code  Family  Communication: None Disposition Plan: [facility] jail   Status is: Inpatient Remains inpatient appropriate because: CHF exacerbation       Subjective:   No significant events overnight, patient is making enough urine, still has lower extremity edema but is gradually improving.  Still has significant edema on the dorsum of feet. Patient denied any complaints  Objective:    Vitals:   04/07/24 0531 04/07/24 0751 04/07/24 1146 04/07/24 1630  BP: (!) 142/87 139/86 136/84 136/84  Pulse: 85 80 82 77  Resp: 20 18 18 18   Temp: 97.9 F (36.6 C) 98.5 F (36.9 C) 98.5 F (36.9 C) 98.5 F (36.9 C)  TempSrc:      SpO2: 100% 99% 99% 100%  Weight:      Height:       No data found.   Intake/Output Summary (Last 24 hours) at 04/07/2024 1839 Last data filed at 04/07/2024 1600 Gross per 24 hour  Intake 1154.58 ml  Output 2550 ml  Net -1395.42 ml   Filed Weights   04/05/24 0448 04/06/24 0500 04/07/24 0500  Weight: 85 kg 79.7 kg 85.1 kg    Exam:  GEN: NAD, left hand handcuffed to the bed SKIN: Unroofed blister wound on the dorsal aspect of bilateral feet EYES: No pallor or icterus ENT: MMM CV: RRR PULM: CTA B ABD: soft, ND, NT, +BS CNS: AAO x 3, non focal EXT: 4+ edema on the dorsum of feet, lower extremity edema is improved.  Ace wraps intact      Data Reviewed:   I have personally reviewed following labs and imaging studies:  Labs: Labs show the following:   Basic Metabolic Panel: Recent Labs  Lab 04/03/24 0420 04/04/24 0457 04/05/24 0612 04/06/24 0328 04/07/24 0558  NA 142 142 138 136 133*  K 3.8 3.8 3.9 3.9 3.9  CL 103 107 102 93* 94*  CO2 28 26 31  32 30  GLUCOSE 112* 121* 129* 96 135*  BUN 45* 31* 24* 24* 24*  CREATININE 1.41* 0.95 0.97 1.11 1.11  CALCIUM  8.8* 8.0* 8.2* 8.3* 8.3*  MG  --   --   --  1.9 1.9  PHOS  --   --   --  3.0 3.0   GFR Estimated Creatinine Clearance: 67.3 mL/min (by C-G formula based on SCr of 1.11  mg/dL). Liver Function Tests: Recent Labs  Lab 04/03/24 0420 04/06/24 0328 04/07/24 0558  AST 70* 45* 42*  ALT 99* 56* 50*  ALKPHOS 309* 186* 163*  BILITOT 2.1* 1.5* 1.3*  PROT 7.1 6.2* 6.4*  ALBUMIN 3.2* 2.6* 2.7*   Recent Labs  Lab 04/03/24 0420  LIPASE 27   No results for input(s): AMMONIA in the last 168 hours. Coagulation profile Recent Labs  Lab 04/03/24 0420  INR 1.3*    CBC: Recent Labs  Lab 04/03/24  0420 04/07/24 0558  WBC 8.9 13.7*  NEUTROABS 5.9  --   HGB 13.9 13.9  HCT 43.8 41.0  MCV 99.8 93.2  PLT 253 265   Cardiac Enzymes: No results for input(s): CKTOTAL, CKMB, CKMBINDEX, TROPONINI in the last 168 hours. BNP (last 3 results) Recent Labs    08/04/23 1222  PROBNP 630*   CBG: No results for input(s): GLUCAP in the last 168 hours. D-Dimer: No results for input(s): DDIMER in the last 72 hours. Hgb A1c: No results for input(s): HGBA1C in the last 72 hours. Lipid Profile: No results for input(s): CHOL, HDL, LDLCALC, TRIG, CHOLHDL, LDLDIRECT in the last 72 hours. Thyroid  function studies: No results for input(s): TSH, T4TOTAL, T3FREE, THYROIDAB in the last 72 hours.  Invalid input(s): FREET3 Anemia work up: Recent Labs    04/06/24 0328  TIBC 358  IRON  21*   Sepsis Labs: Recent Labs  Lab 04/03/24 0420 04/07/24 0558  PROCALCITON  --  <0.10  WBC 8.9 13.7*  LATICACIDVEN 1.7  --     Microbiology Recent Results (from the past 240 hours)  Blood culture (routine single)     Status: None (Preliminary result)   Collection Time: 04/03/24  4:20 AM   Specimen: BLOOD  Result Value Ref Range Status   Specimen Description BLOOD BLOOD LEFT ARM  Final   Special Requests   Final    BOTTLES DRAWN AEROBIC AND ANAEROBIC Blood Culture adequate volume   Culture   Final    NO GROWTH 4 DAYS Performed at [facility], [address] [place], [place] [address]    Report Status PENDING  Incomplete     Procedures and diagnostic studies:  No results found.  Total time spent: 55 minutes   LOS: 4 days   Kevin [name] Von  [facility]   Pager on [email or web address]. If 7PM-7AM, please contact night-coverage at [email or web address]    04/07/2024, 6:39 PM

## 2024-04-07 NOTE — Progress Notes (Signed)
 Pt pulled out two PIV this shift, catheter tip intact , site clean and dressing applied. New PIV inserted this AM. Lasix  infusing per order.

## 2024-04-07 NOTE — Plan of Care (Signed)

## 2024-04-07 NOTE — Plan of Care (Signed)
   Problem: Clinical Measurements: Goal: Ability to maintain clinical measurements within normal limits will improve Outcome: Progressing   Problem: Nutrition: Goal: Adequate nutrition will be maintained Outcome: Progressing   Problem: Coping: Goal: Level of anxiety will decrease Outcome: Progressing   Problem: Safety: Goal: Ability to remain free from injury will improve Outcome: Progressing

## 2024-04-08 ENCOUNTER — Inpatient Hospital Stay

## 2024-04-08 DIAGNOSIS — I5043 Acute on chronic combined systolic (congestive) and diastolic (congestive) heart failure: Secondary | ICD-10-CM | POA: Diagnosis not present

## 2024-04-08 LAB — BASIC METABOLIC PANEL WITH GFR
Anion gap: 11 (ref 5–15)
BUN: 22 mg/dL (ref 8–23)
CO2: 27 mmol/L (ref 22–32)
Calcium: 8.3 mg/dL — ABNORMAL LOW (ref 8.9–10.3)
Chloride: 97 mmol/L — ABNORMAL LOW (ref 98–111)
Creatinine, Ser: 0.93 mg/dL (ref 0.61–1.24)
GFR, Estimated: >60 mL/min (ref >60)
Glucose, Bld: 152 mg/dL — ABNORMAL HIGH (ref 70–99)
Potassium: 3.6 mmol/L (ref 3.5–5.1)
Sodium: 135 mmol/L (ref 135–145)

## 2024-04-08 LAB — CULTURE, BLOOD (SINGLE)
Culture: NO GROWTH
Special Requests: ADEQUATE

## 2024-04-08 LAB — HEPATIC FUNCTION PANEL
ALT: 42 U/L (ref 0–44)
AST: 36 U/L (ref 15–41)
Albumin: 2.7 g/dL — ABNORMAL LOW (ref 3.5–5.0)
Alkaline Phosphatase: 138 U/L — ABNORMAL HIGH (ref 38–126)
Bilirubin, Direct: 0.3 mg/dL — ABNORMAL HIGH (ref 0.0–0.2)
Indirect Bilirubin: 0.7 mg/dL (ref 0.3–0.9)
Total Bilirubin: 1 mg/dL (ref 0.0–1.2)
Total Protein: 6.5 g/dL (ref 6.5–8.1)

## 2024-04-08 LAB — CBC
HCT: 41.1 % (ref 39.0–52.0)
Hemoglobin: 13.4 g/dL (ref 13.0–17.0)
MCH: 30.5 pg (ref 26.0–34.0)
MCHC: 32.6 g/dL (ref 30.0–36.0)
MCV: 93.6 fL (ref 80.0–100.0)
Platelets: 251 K/uL (ref 150–400)
RBC: 4.39 MIL/uL (ref 4.22–5.81)
RDW: 13.9 % (ref 11.5–15.5)
WBC: 13.8 K/uL — ABNORMAL HIGH (ref 4.0–10.5)
nRBC: 0 % (ref 0.0–0.2)

## 2024-04-08 MED ORDER — POTASSIUM CHLORIDE CRYS ER 20 MEQ PO TBCR
40.0000 meq | EXTENDED_RELEASE_TABLET | Freq: Once | ORAL | Status: AC
  Administered 2024-04-08: 40 meq via ORAL
  Filled 2024-04-08: qty 2

## 2024-04-08 MED ORDER — BACITRACIN-NEOMYCIN-POLYMYXIN OINTMENT TUBE
TOPICAL_OINTMENT | Freq: Two times a day (BID) | CUTANEOUS | Status: DC
  Administered 2024-04-09: 1 via TOPICAL
  Filled 2024-04-08: qty 14.17

## 2024-04-08 NOTE — Plan of Care (Signed)

## 2024-04-08 NOTE — Plan of Care (Signed)
   Problem: Clinical Measurements: Goal: Ability to maintain clinical measurements within normal limits will improve Outcome: Progressing   Problem: Nutrition: Goal: Adequate nutrition will be maintained Outcome: Progressing   Problem: Elimination: Goal: Will not experience complications related to bowel motility Outcome: Progressing

## 2024-04-08 NOTE — Progress Notes (Signed)
 Progress Note    Kevin [name]  FMW:[id number] DOB: 07/07/1956  DOA: 04/03/2024 PCP: Pcp, No   Brief Narrative:    Medical records reviewed and are as summarized below:  Kevin [name] is a 68 y.o. 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  He said he is medically adherent.  He has been in [facility] center since March 2025.  He noticed gradual swelling of his legs and feet associated with exertional shortness of breath which has been going on for about a month.  Symptoms progressively worsened in the last 2 days and he could not even lay down flat.  He had increasing cough and shortness of breath so he presented to the ED for further management.  He has poor appetite and poor oral intake as well.  BP in the ED was 185/125 and oxygen saturation was 97% on room air.  Chest x-ray showed cardiomegaly and pulmonary congestion.  Creatinine was 1.4 and BUN 45.     Assessment/Plan:   Principal Problem:   Acute on chronic combined systolic and diastolic CHF, NYHA class 4 (HCC) Active Problems:   CHF (congestive heart failure) (HCC)   Acute on chronic combined systolic and diastolic CHF (dilated nonischemic cardiomyopathy), anasarca S/p IV Lasix  boluses and s/p IV Lasix  infusion at 8 mg/h>> infusion rate was decreased to 4 mg/h Continue carvedilol , hydralazine , Imdur , Entresto  and spironolactone .   Monitor BMP, daily weight and urine output. BNP 3,957.9 2D echo November 2024 showed EF estimated at 20 to 25%, indeterminate LV diastolic parameters, moderately dilated left and right atria, mild MR, moderate to severe tricuspid regurgitation, severely dilated pulmonary artery 8/13 Ace wrap applied to bilateral lower extremities, edema is improving 8/14 fluid collection on the dorsum of left foot. US  soft tissue:  Subcutaneous edema and swelling in the dorsal aspect of the left foot. No focal fluid collection or soft tissue mass.  X-ray Left foot: No fracture, nonspecific edema. Continue Ace wrap and keep legs elevated.  Leukocytosis, most likely reactive, procalcitonin negative. Monitor without antibiotics  Hypertensive emergency: Continue antihypertensives   Paroxysmal atrial fibrillation: Continue Eliquis  and carvedilol . S/p TEE cardioversion 07/17/2023  NSVT on telemetry on 04/04/2024: Continue carvedilol   AKI: Improved.   Elevated liver enzymes: Probably from hepatic congestion from CHF. Liver ultrasound did not show any acute findings.   COPD: Compensated  # Iron  deficiency, Tsat 6%, Venofer  300 mg IV one-time dose given.  Patient will benefit from oral iron  supplement with vitamin C  on discharge.  Follow-up PCP to repeat iron  profile after 3 to 6 months  # Vitamin D  deficiency: started vitamin D  50,000 units p.o. weekly, follow with PCP to repeat vitamin D  level after 3 to 6 months.   Body mass index is 27.52 kg/m.-    Diet Order             Diet Heart Room service appropriate? Yes; Fluid consistency: Thin  Diet effective now                   Consultants: None  Procedures: None    Medications:    apixaban   5 mg Oral BID   carvedilol   3.125 mg Oral BID   hydrALAZINE   25 mg Oral Q8H   isosorbide  mononitrate  30 mg Oral Daily   sacubitril -valsartan   1 tablet Oral  BID   sodium chloride  flush  3 mL Intravenous Q12H   spironolactone   25 mg Oral Daily   tamsulosin   0.4 mg Oral QPC supper   Vitamin D  (Ergocalciferol )  50,000 Units Oral Q7 days   Continuous Infusions:  furosemide  (LASIX ) 200 mg in dextrose  5 % 100 mL (2 mg/mL) infusion 4 mg/hr (04/07/24 0754)     Anti-infectives (From admission, onward)    Start     Dose/Rate Route Frequency Ordered Stop   04/07/24 1315  amoxicillin -clavulanate (AUGMENTIN ) 875-125 MG per tablet 1 tablet  Status:   Discontinued        1 tablet Oral 2 times daily 04/07/24 1217 04/07/24 1218          Family Communication/Anticipated D/C date and plan/Code Status   DVT prophylaxis:  apixaban  (ELIQUIS ) tablet 5 mg     Code Status: Full Code  Family Communication: None Disposition Plan: Plan to discharge to jail   Status is: Inpatient Remains inpatient appropriate because: CHF exacerbation       Subjective:   No significant events overnight, complaining of pain on the dorsum of feet, burning and pressure sensation.  Edema is gradually improving.  Still has significant edema on the dorsum of left foot.   Objective:    Vitals:   04/08/24 0500 04/08/24 0847 04/08/24 1545 04/08/24 1547  BP:  (!) 111/94 (!) 85/68 110/66  Pulse:  77 86 79  Resp:      Temp:  98.2 F (36.8 C) 98.7 F (37.1 C)   TempSrc:  Oral Oral   SpO2:  99% 97% 100%  Weight: 86.2 kg     Height:       No data found.   Intake/Output Summary (Last 24 hours) at 04/08/2024 1806 Last data filed at 04/08/2024 1440 Gross per 24 hour  Intake 120 ml  Output 2325 ml  Net -2205 ml   Filed Weights   04/06/24 0500 04/07/24 0500 04/08/24 0500  Weight: 79.7 kg 85.1 kg 86.2 kg    Exam:  GEN: NAD, left hand handcuffed to the bed SKIN: Unroofed blister wound on the dorsal aspect of bilateral feet EYES: No pallor or icterus ENT: MMM CV: RRR PULM: CTA B ABD: soft, ND, NT, +BS CNS: AAO x 3, non focal EXT: 4+ edema on the dorsum of left foot.  Right foot and bilateral lower extremity edema has improved.  Continue Ace wraps and keep legs elevated.       Data Reviewed:   I have personally reviewed following labs and imaging studies:  Labs: Labs show the following:   Basic Metabolic Panel: Recent Labs  Lab 04/04/24 0457 04/05/24 0612 04/06/24 0328 04/07/24 0558 04/08/24 0657  NA 142 138 136 133* 135  K 3.8 3.9 3.9 3.9 3.6  CL 107 102 93* 94* 97*  CO2 26 31 32 30 27  GLUCOSE 121* 129* 96 135* 152*   BUN 31* 24* 24* 24* 22  CREATININE 0.95 0.97 1.11 1.11 0.93  CALCIUM  8.0* 8.2* 8.3* 8.3* 8.3*  MG  --   --  1.9 1.9  --   PHOS  --   --  3.0 3.0  --    GFR Estimated Creatinine Clearance: 80.8 mL/min (by C-G formula based on SCr of 0.93 mg/dL). Liver Function Tests: Recent Labs  Lab 04/03/24 0420 04/06/24 0328 04/07/24 0558 04/08/24 0657  AST 70* 45* 42* 36  ALT 99* 56* 50* 42  ALKPHOS 309* 186* 163* 138*  BILITOT 2.1* 1.5*  1.3* 1.0  PROT 7.1 6.2* 6.4* 6.5  ALBUMIN 3.2* 2.6* 2.7* 2.7*   Recent Labs  Lab 04/03/24 0420  LIPASE 27   No results for input(s): AMMONIA in the last 168 hours. Coagulation profile Recent Labs  Lab 04/03/24 0420  INR 1.3*    CBC: Recent Labs  Lab 04/03/24 0420 04/07/24 0558 04/08/24 0657  WBC 8.9 13.7* 13.8*  NEUTROABS 5.9  --   --   HGB 13.9 13.9 13.4  HCT 43.8 41.0 41.1  MCV 99.8 93.2 93.6  PLT 253 265 251   Cardiac Enzymes: No results for input(s): CKTOTAL, CKMB, CKMBINDEX, TROPONINI in the last 168 hours. BNP (last 3 results) Recent Labs    08/04/23 1222  PROBNP 630*   CBG: No results for input(s): GLUCAP in the last 168 hours. D-Dimer: No results for input(s): DDIMER in the last 72 hours. Hgb A1c: No results for input(s): HGBA1C in the last 72 hours. Lipid Profile: No results for input(s): CHOL, HDL, LDLCALC, TRIG, CHOLHDL, LDLDIRECT in the last 72 hours. Thyroid  function studies: No results for input(s): TSH, T4TOTAL, T3FREE, THYROIDAB in the last 72 hours.  Invalid input(s): FREET3 Anemia work up: Recent Labs    04/06/24 0328  TIBC 358  IRON  21*   Sepsis Labs: Recent Labs  Lab 04/03/24 0420 04/07/24 0558 04/08/24 0657  PROCALCITON  --  <0.10  --   WBC 8.9 13.7* 13.8*  LATICACIDVEN 1.7  --   --     Microbiology Recent Results (from the past 240 hours)  Blood culture (routine single)     Status: None   Collection Time: 04/03/24  4:20 AM   Specimen: BLOOD   Result Value Ref Range Status   Specimen Description BLOOD BLOOD LEFT ARM  Final   Special Requests   Final    BOTTLES DRAWN AEROBIC AND ANAEROBIC Blood Culture adequate volume   Culture   Final    NO GROWTH 5 DAYS Performed at [facility], [address], [place], [place] [address]    Report Status 04/08/2024 FINAL  Final    Procedures and diagnostic studies:  US  LT LOWER EXTREM LTD SOFT TISSUE NON VASCULAR Result Date: 04/08/2024 CLINICAL DATA:  Dorsal foot pain and swelling. EXAM: ULTRASOUND LEFT LOWER EXTREMITY LIMITED TECHNIQUE: Ultrasound examination of the lower extremity soft tissues was performed in the area of clinical concern. COMPARISON:  None Available. FINDINGS: Dedicated imaging of the dorsal aspect of the left foot. Subcutaneous edema and swelling is seen in this region. No focal fluid collection or soft tissue mass. IMPRESSION: Subcutaneous edema and swelling in the dorsal aspect of the left foot. No focal fluid collection or soft tissue mass. Electronically Signed   By: Greig Pique M.D.   On: 04/08/2024 15:59   DG Foot 2 Views Left Result Date: 04/08/2024 CLINICAL DATA:  Left foot swelling. EXAM: LEFT FOOT - 2 VIEW COMPARISON:  None Available. FINDINGS: No fracture or bone lesion. Joints are normally spaced and aligned. Small plantar calcaneal spur. There is nonspecific soft tissue swelling most evident along the dorsal aspect of the foot. No soft tissue air. IMPRESSION: 1. No fracture or acute finding. 2. Nonspecific soft tissue swelling. Electronically Signed   By: Alm Parkins M.D.   On: 04/08/2024 13:07   Patient Total time spent: 55 minutes   LOS: 5 days   Joselyn Edling Von  [facility]   Pager on [email or web address]. If 7PM-7AM, please contact night-coverage at [email or web address]    04/08/2024, 6:06 PM

## 2024-04-08 NOTE — TOC Progression Note (Signed)
 Transition of Care [facility]) - Progression Note    Patient Details  Name: Kevin [name] MRN: [id number] Date of Birth: October 27, 1955  [facility] CM/SW Contact  Delphine KANDICE Bring, RN Phone Number: 04/08/2024, 2:06 PM  Clinical Narrative:    Patient has a guard at bedside. Patient is from [facility]. He has been there since March . Dr. Von states awaiting for X-ray and Ultrasound of left foot . Pending results may d/c today                      Expected Discharge Plan and Services                                               Social Drivers of Health (SDOH) Interventions SDOH Screenings   Food Insecurity: No Food Insecurity (04/05/2024)  Housing: Unknown (04/05/2024)  Transportation Needs: Unmet Transportation Needs (04/05/2024)  Utilities: Not At Risk (04/03/2024)  Alcohol Screen: Low Risk  (07/14/2023)  Depression (PHQ2-9): Low Risk  (09/18/2021)  Financial Resource Strain: High Risk (04/05/2024)  Physical Activity: Insufficiently Active (01/20/2020)  Social Connections: Unknown (04/03/2024)  Stress: No Stress Concern Present (08/04/2019)  Tobacco Use: Medium Risk (04/05/2024)    Readmission Risk Interventions     No data to display

## 2024-04-09 ENCOUNTER — Other Ambulatory Visit: Payer: Self-pay

## 2024-04-09 DIAGNOSIS — I5043 Acute on chronic combined systolic (congestive) and diastolic (congestive) heart failure: Secondary | ICD-10-CM | POA: Diagnosis not present

## 2024-04-09 LAB — HEPATIC FUNCTION PANEL
ALT: 39 U/L (ref 0–44)
AST: 33 U/L (ref 15–41)
Albumin: 2.6 g/dL — ABNORMAL LOW (ref 3.5–5.0)
Alkaline Phosphatase: 129 U/L — ABNORMAL HIGH (ref 38–126)
Bilirubin, Direct: 0.4 mg/dL — ABNORMAL HIGH (ref 0.0–0.2)
Indirect Bilirubin: 0.9 mg/dL (ref 0.3–0.9)
Total Bilirubin: 1.3 mg/dL — ABNORMAL HIGH (ref 0.0–1.2)
Total Protein: 6.4 g/dL — ABNORMAL LOW (ref 6.5–8.1)

## 2024-04-09 LAB — URINALYSIS, W/ REFLEX TO CULTURE (INFECTION SUSPECTED)
Bacteria, UA: NONE SEEN
Bilirubin Urine: NEGATIVE
Glucose, UA: NEGATIVE mg/dL
Hgb urine dipstick: NEGATIVE
Ketones, ur: NEGATIVE mg/dL
Leukocytes,Ua: NEGATIVE
Nitrite: NEGATIVE
Protein, ur: NEGATIVE mg/dL
Specific Gravity, Urine: 1.011 (ref 1.005–1.030)
Squamous Epithelial / HPF: 0 /HPF (ref 0–5)
pH: 7 (ref 5.0–8.0)

## 2024-04-09 LAB — BASIC METABOLIC PANEL WITH GFR
Anion gap: 10 (ref 5–15)
BUN: 22 mg/dL (ref 8–23)
CO2: 26 mmol/L (ref 22–32)
Calcium: 8.4 mg/dL — ABNORMAL LOW (ref 8.9–10.3)
Chloride: 98 mmol/L (ref 98–111)
Creatinine, Ser: 1.13 mg/dL (ref 0.61–1.24)
GFR, Estimated: >60 mL/min (ref >60)
Glucose, Bld: 124 mg/dL — ABNORMAL HIGH (ref 70–99)
Potassium: 3.7 mmol/L (ref 3.5–5.1)
Sodium: 134 mmol/L — ABNORMAL LOW (ref 135–145)

## 2024-04-09 LAB — CBC
HCT: 39.3 % (ref 39.0–52.0)
Hemoglobin: 13.1 g/dL (ref 13.0–17.0)
MCH: 31.2 pg (ref 26.0–34.0)
MCHC: 33.3 g/dL (ref 30.0–36.0)
MCV: 93.6 fL (ref 80.0–100.0)
Platelets: 257 K/uL (ref 150–400)
RBC: 4.2 MIL/uL — ABNORMAL LOW (ref 4.22–5.81)
RDW: 14 % (ref 11.5–15.5)
WBC: 16.1 K/uL — ABNORMAL HIGH (ref 4.0–10.5)
nRBC: 0 % (ref 0.0–0.2)

## 2024-04-09 MED ORDER — POLYSACCHARIDE IRON COMPLEX 150 MG PO CAPS
150.0000 mg | ORAL_CAPSULE | Freq: Every day | ORAL | 2 refills | Status: AC
  Filled 2024-04-09: qty 30, 30d supply, fill #0

## 2024-04-09 MED ORDER — POLYSACCHARIDE IRON COMPLEX 150 MG PO CAPS
150.0000 mg | ORAL_CAPSULE | Freq: Every day | ORAL | Status: DC
  Administered 2024-04-09: 150 mg via ORAL
  Filled 2024-04-09: qty 1

## 2024-04-09 MED ORDER — CARVEDILOL 6.25 MG PO TABS
6.2500 mg | ORAL_TABLET | Freq: Two times a day (BID) | ORAL | 11 refills | Status: DC
  Filled 2024-04-09: qty 60, 30d supply, fill #0

## 2024-04-09 MED ORDER — AMOXICILLIN-POT CLAVULANATE 875-125 MG PO TABS
1.0000 | ORAL_TABLET | Freq: Two times a day (BID) | ORAL | 0 refills | Status: AC
  Filled 2024-04-09: qty 10, 5d supply, fill #0

## 2024-04-09 MED ORDER — VITAMIN C 500 MG PO TABS
500.0000 mg | ORAL_TABLET | Freq: Every day | ORAL | Status: DC
  Administered 2024-04-09: 500 mg via ORAL
  Filled 2024-04-09: qty 1

## 2024-04-09 MED ORDER — DAPAGLIFLOZIN PROPANEDIOL 10 MG PO TABS
10.0000 mg | ORAL_TABLET | Freq: Every day | ORAL | 11 refills | Status: DC
  Filled 2024-04-09: qty 30, 30d supply, fill #0

## 2024-04-09 MED ORDER — FUROSEMIDE 40 MG PO TABS
40.0000 mg | ORAL_TABLET | Freq: Every day | ORAL | 11 refills | Status: DC
  Filled 2024-04-09: qty 30, 30d supply, fill #0

## 2024-04-09 MED ORDER — BACITRACIN-NEOMYCIN-POLYMYXIN OINTMENT TUBE
1.0000 | TOPICAL_OINTMENT | Freq: Two times a day (BID) | CUTANEOUS | 0 refills | Status: AC
  Filled 2024-04-09: qty 28.4, 10d supply, fill #0

## 2024-04-09 MED ORDER — VITAMIN D (ERGOCALCIFEROL) 1.25 MG (50000 UNIT) PO CAPS
50000.0000 [IU] | ORAL_CAPSULE | ORAL | 0 refills | Status: DC
  Filled 2024-04-09: qty 12, 84d supply, fill #0

## 2024-04-09 MED ORDER — ASCORBIC ACID 500 MG PO TABS
500.0000 mg | ORAL_TABLET | Freq: Every day | ORAL | 2 refills | Status: DC
  Filled 2024-04-09: qty 30, 30d supply, fill #0

## 2024-04-09 MED ORDER — APIXABAN 5 MG PO TABS
5.0000 mg | ORAL_TABLET | Freq: Two times a day (BID) | ORAL | 11 refills | Status: DC
  Filled 2024-04-09: qty 60, 30d supply, fill #0

## 2024-04-09 NOTE — Discharge Summary (Signed)
 [facility] Discharge Summary   Patient: Kevin [name] FMW:[id number]  PCP: Pcp, No  Date of admission: 04/03/2024   Date of discharge:  04/09/2024     Discharge Diagnoses:  Principal Problem:   Acute on chronic combined systolic and diastolic CHF, NYHA class 4 (HCC) Active Problems:   CHF (congestive heart failure) (HCC)   Admitted From: [place] [facility] (jail/police custody) Disposition: Back to the facility  Recommendations for Outpatient Follow-up:  Follow-up with PCP, patient should be seen by an MD in 1 week, repeat CBC and BMP after 1 week.  Monitor BP and titrate medications accordingly Repeat iron  profile and vitamin D  level after 3 to 6 months. Follow-up with cardiology as per schedule Follow up LABS/TEST:  As above   Follow-up Information     [facility]. Go on 04/12/2024.   Specialty: Cardiology Why: Hospital Follow-Up 04/12/2024 @ 9:00 AM Please bring list of medications with you to appointment [facility], [address] Contact information: [address] [place]  [address] [phone]               Diet recommendation: Cardiac and Carb modified diet.  Fluid restriction 1.5 L/day  Activity: The patient is advised to gradually reintroduce usual activities, as tolerated  Discharge Condition: stable  Code Status: Full code   History of present illness: As per the H and P dictated on admission.  Hospital Course:   Kevin [name] is a 68 y.o. 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  He said he is medically adherent.  He has been in [employer] center since March 2025.  He noticed gradual swelling of his legs and feet associated with exertional shortness of breath which  has been going on for about a month.  Symptoms progressively worsened in the last 2 days and he could not even lay down flat.  He had increasing cough and shortness of breath so he presented to the ED for further management.  He has poor appetite and poor oral intake as well.   BP in the ED was 185/125 and oxygen saturation was 97% on room air.  Chest x-ray showed cardiomegaly and pulmonary congestion.  Creatinine was 1.4 and BUN 45.  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  Transition to oral Lasix  40 mg p.o. daily. Continue carvedilol  6.25 mg p.o. twice daily.  Continue Entresto , spironolactone  and Farxiga  prior dose, no changes.  BNP 3,957.9 was elevated 2D echo November 2024 showed EF estimated at 20 to 25%, indeterminate LV diastolic parameters, moderately dilated left and right atria, mild MR, moderate to severe tricuspid regurgitation, severely dilated pulmonary artery 8/13 Ace wrap applied to bilateral lower extremities, edema is improving 8/14 fluid collection on the dorsum of left foot. US  soft tissue: Subcutaneous edema and swelling in the dorsal aspect of the left foot. No focal fluid collection or soft tissue mass.  X-ray Left foot: No fracture, nonspecific edema. Continue fluid restriction 1.5 L/day.  No need of more ace wraps.  Patient was advised to keep legs elevated whenever he is laying in the bed. Repeat BMP after 1 week  Possible cellulitis on the dorsum of feet, WBC count slightly elevated but procalcitonin negative.  Continue topical ointment with foam dressing and empirically started Augmentin  twice daily for 5 days. Follow-up with PCP in 1 week.  Repeat CBC after 1 week    Hypertensive emergency: Continue antihypertensives medications as above.  Monitor BP and titrate medications  accordingly     Paroxysmal atrial fibrillation: Continue Eliquis  and carvedilol . S/p TEE cardioversion 07/17/2023   NSVT on telemetry on 04/04/2024: Continue carvedilol    AKI: Improved.    Elevated liver enzymes: Probably from hepatic congestion from CHF. Liver ultrasound did not show any acute findings.    COPD: Compensated   # Iron  deficiency, Tsat 6%, Venofer  300 mg IV one-time dose given.  Patient will benefit from oral iron  supplement with vitamin C  on discharge.  Follow-up PCP to repeat iron  profile after 3 to 6 months   # Vitamin D  deficiency: started vitamin D  50,000 units p.o. weekly, follow with PCP to repeat vitamin D  level after 3 to 6 months.   Body mass index is 27.31 kg/m.  Nutrition Interventions:  Patient was ambulatory without any assistance. On the day of the discharge the patient's vitals were stable, and no other acute medical condition were reported by patient. the patient was felt safe to be discharge back to the facility.   Consultants: None Procedures: None  Discharge Exam: General: Appear in no distress, no Rash; Oral Mucosa Clear, moist. Cardiovascular: S1 and S2 Present, no Murmur, Respiratory: normal respiratory effort, Bilateral Air entry present and no Crackles, no wheezes Abdomen: Bowel Sound present, Soft and no tenderness, no hernia Extremities: no Pedal edema, no calf tenderness Neurology: alert and oriented to time, place, and person affect appropriate.  Filed Weights   04/07/24 0500 04/08/24 0500 04/09/24 0500  Weight: 85.1 kg 86.2 kg 79.1 kg   Vitals:   04/09/24 0347 04/09/24 0835  BP: 97/65 131/89  Pulse: 83 85  Resp: 20   Temp: 98.4 F (36.9 C) 98.1 F (36.7 C)  SpO2: 96% 98%    DISCHARGE MEDICATION: Allergies as of 04/09/2024   No Known Allergies      Medication List     STOP taking these medications    albuterol  108 (90 Base) MCG/ACT inhaler Commonly known as: VENTOLIN  HFA   digoxin  0.125 MG tablet Commonly  known as: LANOXIN    gabapentin  300 MG capsule Commonly known as: NEURONTIN    leptospermum manuka honey Pste paste   metoprolol tartrate 25 MG tablet Commonly known as: LOPRESSOR   silver  sulfADIAZINE  1 % cream Commonly known as: SILVADENE    Vitamin D  50 MCG (2000 UT) tablet       TAKE these medications    amoxicillin -clavulanate 875-125 MG tablet Commonly known as: AUGMENTIN  Take 1 tablet by mouth 2 (two) times daily for 5 days.   apixaban  5 MG Tabs tablet Commonly known as: ELIQUIS  Take 1 tablet (5 mg total) by mouth 2 (two) times daily.   ascorbic acid  500 MG tablet Commonly known as: VITAMIN C  Take 1 tablet (500 mg total) by mouth daily. Start taking on: April 10, 2024   Blood Pressure Kit 1 kit by Does not apply route daily.   carvedilol  6.25 MG tablet Commonly known as: COREG  Take 1 tablet (6.25 mg total) by mouth 2 (two) times daily. What changed:  medication strength how much to take   dapagliflozin  propanediol 10 MG Tabs tablet Commonly known as: FARXIGA  Take 1 tablet (10 mg total) by mouth daily.  furosemide  40 MG tablet Commonly known as: LASIX  Take 1 tablet (40 mg total) by mouth daily. What changed: how much to take   iron  polysaccharides 150 MG capsule Commonly known as: NIFEREX Take 1 capsule (150 mg total) by mouth daily. Start taking on: April 10, 2024   neomycin -bacitracin -polymyxin Oint Commonly known as: NEOSPORIN Apply 1 Application topically 2 (two) times daily for 10 days.   pravastatin  20 MG tablet Commonly known as: PRAVACHOL  TAKE ONE TABLET BY MOUTH EVERY DAY   sacubitril -valsartan  49-51 MG Commonly known as: ENTRESTO  Take 1 tablet by mouth 2 (two) times daily.   spironolactone  25 MG tablet Commonly known as: ALDACTONE  Take 0.5 tablets (12.5 mg total) by mouth daily. What changed: how much to take   tamsulosin  0.4 MG Caps capsule Commonly known as: FLOMAX  Take 1 capsule (0.4 mg total) by mouth daily after  supper.   Vitamin D  (Ergocalciferol ) 1.25 MG (50000 UNIT) Caps capsule Commonly known as: DRISDOL  Take 1 capsule (50,000 Units total) by mouth every 7 (seven) days. Start taking on: April 14, 2024               Discharge Care Instructions  (From admission, onward)           Start     Ordered   04/09/24 0000  Discharge wound care:       Comments: As above   04/09/24 1429           No Known Allergies Discharge Instructions     Call MD for:   Complete by: As directed    Worsening of lower extremity edema   Call MD for:  difficulty breathing, headache or visual disturbances   Complete by: As directed    Call MD for:  extreme fatigue   Complete by: As directed    Call MD for:  persistant dizziness or light-headedness   Complete by: As directed    Call MD for:  persistant nausea and vomiting   Complete by: As directed    Call MD for:  redness, tenderness, or signs of infection (pain, swelling, redness, odor or green/yellow discharge around incision site)   Complete by: As directed    Call MD for:  severe uncontrolled pain   Complete by: As directed    Call MD for:  temperature >100.4   Complete by: As directed    Diet - low sodium heart healthy   Complete by: As directed    Diet Carb Modified   Complete by: As directed    Discharge instructions   Complete by: As directed    Follow-up with PCP, patient should be seen by an MD in 1 week, repeat CBC and BMP after 1 week.  Monitor BP and titrate medications accordingly Repeat iron  profile and vitamin D  level after 3 to 6 months. Follow-up with cardiology as per schedule   Discharge wound care:   Complete by: As directed    As above   Increase activity slowly   Complete by: As directed        The results of significant diagnostics from this hospitalization (including imaging, microbiology, ancillary and laboratory) are listed below for reference.    Significant Diagnostic Studies: US  LT LOWER EXTREM LTD  SOFT TISSUE NON VASCULAR Result Date: 04/08/2024 CLINICAL DATA:  Dorsal foot pain and swelling. EXAM: ULTRASOUND LEFT LOWER EXTREMITY LIMITED TECHNIQUE: Ultrasound examination of the lower extremity soft tissues was performed in the area of clinical concern. COMPARISON:  None Available. FINDINGS: Dedicated  imaging of the dorsal aspect of the left foot. Subcutaneous edema and swelling is seen in this region. No focal fluid collection or soft tissue mass. IMPRESSION: Subcutaneous edema and swelling in the dorsal aspect of the left foot. No focal fluid collection or soft tissue mass. Electronically Signed   By: Greig Pique M.D.   On: 04/08/2024 15:59   DG Foot 2 Views Left Result Date: 04/08/2024 CLINICAL DATA:  Left foot swelling. EXAM: LEFT FOOT - 2 VIEW COMPARISON:  None Available. FINDINGS: No fracture or bone lesion. Joints are normally spaced and aligned. Small plantar calcaneal spur. There is nonspecific soft tissue swelling most evident along the dorsal aspect of the foot. No soft tissue air. IMPRESSION: 1. No fracture or acute finding. 2. Nonspecific soft tissue swelling. Electronically Signed   By: Alm Parkins M.D.   On: 04/08/2024 13:07   US  Abdomen Limited RUQ (LIVER/GB) Result Date: 04/03/2024 CLINICAL DATA:  Abnormal liver function tests. EXAM: ULTRASOUND ABDOMEN LIMITED RIGHT UPPER QUADRANT COMPARISON:  CT abdomen pelvis 02/28/2023. FINDINGS: Gallbladder: Wall thickening, 5 mm. No gallstones, sonographic Murphy sign or pericholecystic fluid. Common bile duct: Diameter: 3 mm, within normal limits. No intrahepatic biliary ductal dilatation. Liver: Diffusely increased in echogenicity. No definite focal lesion. Portal vein is patent on color Doppler imaging with normal direction of blood flow towards the liver. Other: None. IMPRESSION: 1. No acute findings. 2. Gallbladder wall thickening be seen with hypoproteinemia or other stomach etiologies. Electronically Signed   By: Newell Eke M.D.   On:  04/03/2024 15:06   DG Chest Port 1 View Result Date: 04/03/2024 CLINICAL DATA:  Questionable sepsis.  Check for infiltrate. EXAM: PORTABLE CHEST 1 VIEW COMPARISON:  AP Lat chest 07/10/2023. FINDINGS: The heart is enlarged, unchanged.  No vascular congestion is seen. There is aortic atherosclerosis. Stable mediastinum. No pneumothorax. There is a small left pleural effusion. Overlying atelectasis or consolidation in the left base. Remainder of the lungs are clear. Degenerative change thoracic spine. IMPRESSION: 1. Small left pleural effusion with overlying atelectasis or consolidation in the left base. 2. Stable cardiomegaly. 3. Aortic atherosclerosis. Electronically Signed   By: Francis Quam M.D.   On: 04/03/2024 05:15    Microbiology: Recent Results (from the past 240 hours)  Blood culture (routine single)     Status: None   Collection Time: 04/03/24  4:20 AM   Specimen: BLOOD  Result Value Ref Range Status   Specimen Description BLOOD BLOOD LEFT ARM  Final   Special Requests   Final    BOTTLES DRAWN AEROBIC AND ANAEROBIC Blood Culture adequate volume   Culture   Final    NO GROWTH 5 DAYS Performed at [facility], [address] [place], [place] [address]    Report Status 04/08/2024 FINAL  Final     Labs: CBC: Recent Labs  Lab 04/03/24 0420 04/07/24 0558 04/08/24 0657 04/09/24 0355  WBC 8.9 13.7* 13.8* 16.1*  NEUTROABS 5.9  --   --   --   HGB 13.9 13.9 13.4 13.1  HCT 43.8 41.0 41.1 39.3  MCV 99.8 93.2 93.6 93.6  PLT 253 265 251 257   Basic Metabolic Panel: Recent Labs  Lab 04/05/24 0612 04/06/24 0328 04/07/24 0558 04/08/24 0657 04/09/24 0355  NA 138 136 133* 135 134*  K 3.9 3.9 3.9 3.6 3.7  CL 102 93* 94* 97* 98  CO2 31 32 30 27 26   GLUCOSE 129* 96 135* 152* 124*  BUN 24* 24* 24* 22 22  CREATININE 0.97 1.11 1.11 0.93 1.13  CALCIUM  8.2* 8.3* 8.3* 8.3* 8.4*  MG  --  1.9 1.9  --   --   PHOS  --  3.0 3.0  --   --    Liver Function Tests: Recent Labs   Lab 04/03/24 0420 04/06/24 0328 04/07/24 0558 04/08/24 0657 04/09/24 0355  AST 70* 45* 42* 36 33  ALT 99* 56* 50* 42 39  ALKPHOS 309* 186* 163* 138* 129*  BILITOT 2.1* 1.5* 1.3* 1.0 1.3*  PROT 7.1 6.2* 6.4* 6.5 6.4*  ALBUMIN 3.2* 2.6* 2.7* 2.7* 2.6*   Recent Labs  Lab 04/03/24 0420  LIPASE 27   No results for input(s): AMMONIA in the last 168 hours. Cardiac Enzymes: No results for input(s): CKTOTAL, CKMB, CKMBINDEX, TROPONINI in the last 168 hours. BNP (last 3 results) Recent Labs    07/10/23 1700 07/14/23 1008 04/03/24 0420  BNP 2,153.9* 1,517.1* 3,957.9*   CBG: No results for input(s): GLUCAP in the last 168 hours.  Time spent: 35 minutes  Signed:  Elvan Sor  [facility] 04/09/2024 2:29 PM

## 2024-04-09 NOTE — Plan of Care (Signed)

## 2024-04-11 NOTE — Progress Notes (Deleted)
 [facility] Note    Referring Physician: 08/25 admission PCP: Pcp, No Cardiologist: Redell Cave, MD   HPI:  Mr Vosler is a 68 y/o male with a history of HTN, dyslipidemia, transaminitis, vitamin D  deficiency, T2DM, atrial flutter/ fibrillation, (s/p cardioversion 07/17/23), COPD, iron  deficient anemia (08/25), tobacco use and chronic heart failure.   Echo 08/04/2019: EF of 25-30% along with mild/moderate TR.  Echo 01/13/20: EF of 50-55% with moderate LVH.   Admitted 07/10/23 due to worsening shortness of breath, orthopnea, PND and worsening swelling in legs/ feet. Found to have HF exacerbation. Echo 07/12/2023 showed EF estimated at 20 to 25%, moderate LVH, moderately reduced RV function, indeterminate LV diastolic parameters, moderate to severe tricuspid regurgitation.  TEE 07/17/23: no thrombus. Cardioverted 07/17/23. Cardiac MRI 07/18/23: showed severely reduced RV function and LV systolic function but no evidence of myocardial infiltrative disease.  Admitted 04/03/24 with increasing shortness of breath and lower extremity swelling for the last month. Has been in [place] [facility] since March 2025. BP in the ED was 185/125 and oxygen saturation was 97% on room air. Chest x-ray showed cardiomegaly and pulmonary congestion. IV diuresed with lasix  drip with transition to oral diuretics. X-ray Left foot: No fracture, nonspecific edema. Augmentin  given for possible foot cellulitis. NSVT on telemetry on 04/04/2024. Elevated liver enzymes: Probably from hepatic congestion from CHF. Liver ultrasound did not show any acute findings.Tsat 6%, Venofer  300 mg IV one-time dose given.   He presents today with his sister, for a HF hospital follow-up visit with a chief complaint of     ROS: All systems negative except what is listed in HPI, PMH and Problem List  Past Medical History:  Diagnosis Date   COPD (chronic obstructive pulmonary disease) (HCC)    Diabetes mellitus  without complication (HCC)    Heart failure with mid-range ejection fraction (HCC)    a. 2019 reported neg stress test and nl cors on cath in [facility]; b. 07/2019 Echo: EF 25-30%, Gr2 DD, glob HK. Mod reduced RV fxn w/ volume overload. Mod dil RA. Mild to mod TR. Mod elev PASP; b. 12/2019 Echo: EF 50-55%; c. 05/2020 Echo: EF 45-50%, glob HK, sev LVH, GrII DD, nl RV size/fxn, mildly BAE, mild AoV sclerosis, mildly dilated Ao root.   Hypertension    NICM (nonischemic cardiomyopathy) (HCC)    Noncompliance    Pleural effusion on right    a. 07/2019 Thoracentesis: 300 ml.   Tobacco abuse     Current Outpatient Medications  Medication Sig Dispense Refill   amoxicillin -clavulanate (AUGMENTIN ) 875-125 MG tablet Take 1 tablet by mouth 2 (two) times daily for 5 days. 10 tablet 0   apixaban  (ELIQUIS ) 5 MG TABS tablet Take 1 tablet (5 mg total) by mouth 2 (two) times daily. 60 tablet 11   ascorbic acid  (VITAMIN C ) 500 MG tablet Take 1 tablet (500 mg total) by mouth daily. 30 tablet 2   Blood Pressure KIT 1 kit by Does not apply route daily. 1 kit 0   carvedilol  (COREG ) 6.25 MG tablet Take 1 tablet (6.25 mg total) by mouth 2 (two) times daily. 60 tablet 11   dapagliflozin  propanediol (FARXIGA ) 10 MG TABS tablet Take 1 tablet (10 mg total) by mouth daily. 30 tablet 11   furosemide  (LASIX ) 40 MG tablet Take 1 tablet (40 mg total) by mouth daily. 30 tablet 11   iron  polysaccharides (NIFEREX) 150 MG capsule Take 1 capsule (150 mg total) by mouth daily. 30 capsule  2   neomycin -bacitracin -polymyxin (NEOSPORIN) OINT Apply 1 Application topically 2 (two) times daily for 10 days. 28.4 g 0   pravastatin  (PRAVACHOL ) 20 MG tablet TAKE ONE TABLET BY MOUTH EVERY DAY (Patient not taking: Reported on 04/03/2024) 90 tablet 3   sacubitril -valsartan  (ENTRESTO ) 49-51 MG Take 1 tablet by mouth 2 (two) times daily. 60 tablet 0   spironolactone  (ALDACTONE ) 25 MG tablet Take 0.5 tablets (12.5 mg total) by mouth daily. (Patient  taking differently: Take 25 mg by mouth daily.) 45 tablet 3   tamsulosin  (FLOMAX ) 0.4 MG CAPS capsule Take 1 capsule (0.4 mg total) by mouth daily after supper. 30 capsule 0   [START ON 04/14/2024] Vitamin D , Ergocalciferol , (DRISDOL ) 1.25 MG (50000 UNIT) CAPS capsule Take 1 capsule (50,000 Units total) by mouth every 7 (seven) days. 12 capsule 0   No current facility-administered medications for this visit.    No Known Allergies    Social History   Socioeconomic History   Marital status: Single    Spouse name: Not on file   Number of children: 2   Years of education: Not on file   Highest education level: High school graduate  Occupational History   Occupation: unemployed  Tobacco Use   Smoking status: Former    Current packs/day: 0.50    Average packs/day: 0.3 packs/day for 25.6 years (7.8 ttl pk-yrs)    Types: Cigarettes    Start date: 2020   Smokeless tobacco: Never   Tobacco comments:    Stopped 2 years ago. 2023  Vaping Use   Vaping status: Never Used  Substance and Sexual Activity   Alcohol use: Never   Drug use: Not Currently   Sexual activity: Not Currently  Other Topics Concern   Not on file  Social History Narrative   Patient moved to [place]  from [place]  in March 2020, to take care of his Mother. She lives in a community managed by her church, independent living for seniors. Patient lives with his Mother, does not drive, and reports he mostly keeps to himself.   Social Drivers of Corporate investment banker Strain: High Risk (04/05/2024)   Overall Financial Resource Strain (CARDIA)    Difficulty of Paying Living Expenses: Hard  Food Insecurity: No Food Insecurity (04/05/2024)   Hunger Vital Sign    Worried About Running Out of Food in the Last Year: Never true    Ran Out of Food in the Last Year: Never true  Transportation Needs: Unmet Transportation Needs (04/05/2024)   PRAPARE - Transportation    Lack of Transportation (Medical): Yes    Lack  of Transportation (Non-Medical): Yes  Physical Activity: Insufficiently Active (01/20/2020)   Exercise Vital Sign    Days of Exercise per Week: 2 days    Minutes of Exercise per Session: 30 min  Stress: No Stress Concern Present (08/04/2019)   [employer] of Occupational Health - Occupational Stress Questionnaire    Feeling of Stress : Not at all  Social Connections: Unknown (04/03/2024)   Social Connection and Isolation Panel    Frequency of Communication with Friends and Family: Once a week    Frequency of Social Gatherings with Friends and Family: Once a week    Attends Religious Services: Never    Database administrator or Organizations: No    Attends Banker Meetings: Never    Marital Status: Patient declined  Intimate Partner Violence: Not At Risk (04/03/2024)   Humiliation, Afraid, Rape, and Kick questionnaire  Fear of Current or Ex-Partner: No    Emotionally Abused: No    Physically Abused: No    Sexually Abused: No     No family history on file.     PHYSICAL EXAM: General:  Well appearing. No respiratory difficulty HEENT: normal Neck: supple. no JVD. No lymphadenopathy or thyromegaly appreciated. Cor: PMI nondisplaced. Regular rate & rhythm. No rubs, gallops or murmurs. Lungs: clear Abdomen: soft, nontender, nondistended. No hepatosplenomegaly. No bruits or masses.  Extremities: no cyanosis, clubbing, rash, edema Neuro: alert & oriented x 3, cranial nerves grossly intact. moves all 4 extremities w/o difficulty. Affect pleasant.   ECG:    ASSESSMENT & PLAN:  1: NICM with reduced ejection fraction- - suspect due to HTN - NYHA class III - euvolemic today - weight 156 pounds from last visit here 8 months ago - Echo 08/04/2019: EF of 25-30% along with mild/moderate TR.  - Echo 01/13/20: EF of 50-55% with moderate LVH.  - Echo 07/12/23: EF 20-25% with moderate LVH, normal PA pressure of 35.9 mmHg, moderate LAE, mild MR, moderate/severe TR - TEE  07/17/23: no thrombus - cMRI 07/18/23:    ECV 33%   1. Mildly dilated LV size, severely reduced LV systolic function. LVEF 26%.    2. There is a thin, midwall, basal septal LGE.   3. Severely reduced RV function.   4. No significant valvular abnormalities.   5. No evidence for myocardial infiltrative disease.   6. Findings consistent with dilated nonischemic cardiomyopathy. - not adding salt and has been trying to read food labels; reviewed the importance of closely following a 2000mg  sodium diet  - continue carvedilol  25mg  BID - continue farxiga  10mg  daily - continue digoxin  0.125mg  daily - continue furosemide  60mg  daily - continue entresto  49/51mg  BID - continue spironolactone  25mg  daily  - saw cardiology ([facility]) 05/21; waiting on cardiology appt to be scheduled - BNP 04/03/24 was 3957.9  2: HTN- - BP  - [facility] 04/09/24 reviewed: sodium 134, potassium 3.7, creatinine 1.13 and GFR >60 - BMET today  3: COPD-  - using nebulizer and inhalers - smoking 3 cigarettes daily  4: PAF- - cardioverted 07/17/23 - continue apixaban  5mg  BID   Ellouise Class, FNP-C 04/11/24

## 2024-04-12 ENCOUNTER — Encounter: Admitting: Family

## 2024-04-13 ENCOUNTER — Telehealth: Payer: Self-pay | Admitting: Family

## 2024-04-13 NOTE — Telephone Encounter (Signed)
 Called to confirm/remind patient of their appointment at the [facility] on 04/14/24.   Appointment:   [x] Confirmed  [] Left mess   [] No answer/No voice mail  [] VM Full/unable to leave message  [] Phone not in service  Patient reminded to bring all medications and/or complete list.  Confirmed patient has transportation. Gave directions, instructed to utilize valet parking.

## 2024-04-14 ENCOUNTER — Ambulatory Visit: Payer: Self-pay | Admitting: Family

## 2024-04-14 ENCOUNTER — Encounter: Payer: Self-pay | Admitting: Family

## 2024-04-14 ENCOUNTER — Ambulatory Visit (HOSPITAL_BASED_OUTPATIENT_CLINIC_OR_DEPARTMENT_OTHER): Admitting: Family

## 2024-04-14 ENCOUNTER — Other Ambulatory Visit
Admission: RE | Admit: 2024-04-14 | Discharge: 2024-04-14 | Disposition: A | Discharge status: Home / Self Care | Source: Ambulatory Visit | Attending: Family | Admitting: Family

## 2024-04-14 VITALS — BP 131/90 | HR 62 | Wt 161.2 lb

## 2024-04-14 DIAGNOSIS — I5042 Chronic combined systolic (congestive) and diastolic (congestive) heart failure: Secondary | ICD-10-CM | POA: Diagnosis not present

## 2024-04-14 DIAGNOSIS — E785 Hyperlipidemia, unspecified: Secondary | ICD-10-CM | POA: Diagnosis not present

## 2024-04-14 DIAGNOSIS — I428 Other cardiomyopathies: Secondary | ICD-10-CM | POA: Diagnosis present

## 2024-04-14 DIAGNOSIS — R7401 Elevation of levels of liver transaminase levels: Secondary | ICD-10-CM | POA: Diagnosis not present

## 2024-04-14 DIAGNOSIS — Z7901 Long term (current) use of anticoagulants: Secondary | ICD-10-CM | POA: Diagnosis not present

## 2024-04-14 DIAGNOSIS — E559 Vitamin D deficiency, unspecified: Secondary | ICD-10-CM | POA: Insufficient documentation

## 2024-04-14 DIAGNOSIS — I480 Paroxysmal atrial fibrillation: Secondary | ICD-10-CM | POA: Insufficient documentation

## 2024-04-14 DIAGNOSIS — Z87891 Personal history of nicotine dependence: Secondary | ICD-10-CM | POA: Insufficient documentation

## 2024-04-14 DIAGNOSIS — I5022 Chronic systolic (congestive) heart failure: Secondary | ICD-10-CM

## 2024-04-14 DIAGNOSIS — J449 Chronic obstructive pulmonary disease, unspecified: Secondary | ICD-10-CM | POA: Insufficient documentation

## 2024-04-14 DIAGNOSIS — D509 Iron deficiency anemia, unspecified: Secondary | ICD-10-CM | POA: Diagnosis not present

## 2024-04-14 DIAGNOSIS — I10 Essential (primary) hypertension: Secondary | ICD-10-CM | POA: Diagnosis not present

## 2024-04-14 DIAGNOSIS — I4892 Unspecified atrial flutter: Secondary | ICD-10-CM | POA: Diagnosis not present

## 2024-04-14 DIAGNOSIS — Z7984 Long term (current) use of oral hypoglycemic drugs: Secondary | ICD-10-CM | POA: Diagnosis not present

## 2024-04-14 DIAGNOSIS — E119 Type 2 diabetes mellitus without complications: Secondary | ICD-10-CM | POA: Diagnosis not present

## 2024-04-14 DIAGNOSIS — I110 Hypertensive heart disease with heart failure: Secondary | ICD-10-CM | POA: Diagnosis not present

## 2024-04-14 DIAGNOSIS — Z79899 Other long term (current) drug therapy: Secondary | ICD-10-CM | POA: Diagnosis not present

## 2024-04-14 LAB — BASIC METABOLIC PANEL WITH GFR
Anion gap: 9 (ref 5–15)
BUN: 20 mg/dL (ref 8–23)
CO2: 23 mmol/L (ref 22–32)
Calcium: 8.6 mg/dL — ABNORMAL LOW (ref 8.9–10.3)
Chloride: 108 mmol/L (ref 98–111)
Creatinine, Ser: 1.01 mg/dL (ref 0.61–1.24)
GFR, Estimated: >60 mL/min (ref >60)
Glucose, Bld: 121 mg/dL — ABNORMAL HIGH (ref 70–99)
Potassium: 3.8 mmol/L (ref 3.5–5.1)
Sodium: 140 mmol/L (ref 135–145)

## 2024-04-14 LAB — CBC
HCT: 40.9 % (ref 39.0–52.0)
Hemoglobin: 13.3 g/dL (ref 13.0–17.0)
MCH: 31.1 pg (ref 26.0–34.0)
MCHC: 32.5 g/dL (ref 30.0–36.0)
MCV: 95.8 fL (ref 80.0–100.0)
Platelets: 321 K/uL (ref 150–400)
RBC: 4.27 MIL/uL (ref 4.22–5.81)
RDW: 14 % (ref 11.5–15.5)
WBC: 7.9 K/uL (ref 4.0–10.5)
nRBC: 0 % (ref 0.0–0.2)

## 2024-04-14 LAB — BRAIN NATRIURETIC PEPTIDE: B Natriuretic Peptide: 1275.4 pg/mL — ABNORMAL HIGH (ref 0.0–100.0)

## 2024-04-14 MED ORDER — SACUBITRIL-VALSARTAN 49-51 MG PO TABS: 1.0000 | ORAL_TABLET | Freq: Two times a day (BID) | ORAL | 3 refills | Status: DC

## 2024-04-14 NOTE — Progress Notes (Signed)
 [facility] Note    Referring Physician: 08/25 admission PCP: Pcp, No Cardiologist: Redell Cave, MD   HPI:  Kevin [name] is a 68 y/o male with a history of HTN, dyslipidemia, transaminitis, vitamin D  deficiency, T2DM, atrial flutter/ fibrillation, (s/p cardioversion 07/17/23), COPD, iron  deficient anemia (08/25), tobacco use and chronic heart failure.   Echo 08/04/2019: EF of 25-30% along with mild/moderate TR.  Echo 01/13/20: EF of 50-55% with moderate LVH.   Admitted 07/10/23 due to worsening shortness of breath, orthopnea, PND and worsening swelling in legs/ feet. Found to have HF exacerbation. Echo 07/12/2023 showed EF estimated at 20 to 25%, moderate LVH, moderately reduced RV function, indeterminate LV diastolic parameters, moderate to severe tricuspid regurgitation.  TEE 07/17/23: no thrombus. Cardioverted 07/17/23. Cardiac MRI 07/18/23: showed severely reduced RV function and LV systolic function but no evidence of myocardial infiltrative disease.  Admitted 04/03/24 with increasing shortness of breath and lower extremity swelling for the last month. Has been in [place] [facility] since March 2025. BP in the ED was 185/125 and oxygen saturation was 97% on room air. Chest x-ray showed cardiomegaly and pulmonary congestion. IV diuresed with lasix  drip with transition to oral diuretics. X-ray Left foot: No fracture, nonspecific edema. Augmentin  given for possible foot cellulitis. NSVT on telemetry on 04/04/2024. Elevated liver enzymes: Probably from hepatic congestion from CHF. Liver ultrasound did not show any acute findings.Tsat 6%, Venofer  300 mg IV one-time dose given.   He presents today with a detention officer & handcuffed, for a HF hospital follow-up visit with a chief complaint of minimal shortness of breath. Continues with pedal edema in his feet. Denies chest pain, palpitations, dizziness. Tearful in the office because he says that he's overjoyed with being  out.    ROS: All systems negative except what is listed in HPI, PMH and Problem List  Past Medical History:  Diagnosis Date   COPD (chronic obstructive pulmonary disease) (HCC)    Diabetes mellitus without complication (HCC)    Heart failure with mid-range ejection fraction (HCC)    a. 2019 reported neg stress test and nl cors on cath in [facility]; b. 07/2019 Echo: EF 25-30%, Gr2 DD, glob HK. Mod reduced RV fxn w/ volume overload. Mod dil RA. Mild to mod TR. Mod elev PASP; b. 12/2019 Echo: EF 50-55%; c. 05/2020 Echo: EF 45-50%, glob HK, sev LVH, GrII DD, nl RV size/fxn, mildly BAE, mild AoV sclerosis, mildly dilated Ao root.   Hypertension    NICM (nonischemic cardiomyopathy) (HCC)    Noncompliance    Pleural effusion on right    a. 07/2019 Thoracentesis: 300 ml.   Tobacco abuse     Current Outpatient Medications  Medication Sig Dispense Refill   amoxicillin -clavulanate (AUGMENTIN ) 875-125 MG tablet Take 1 tablet by mouth 2 (two) times daily for 5 days. 10 tablet 0   ascorbic acid  (VITAMIN C ) 500 MG tablet Take 1 tablet (500 mg total) by mouth daily. 30 tablet 2   Blood Pressure KIT 1 kit by Does not apply route daily. 1 kit 0   carvedilol  (COREG ) 6.25 MG tablet Take 1 tablet (6.25 mg total) by mouth 2 (two) times daily. 60 tablet 11   furosemide  (LASIX ) 40 MG tablet Take 1 tablet (40 mg total) by mouth daily. 30 tablet 11   neomycin -bacitracin -polymyxin (NEOSPORIN) OINT Apply 1 Application topically 2 (two) times daily for 10 days. 28.4 g 0   pravastatin  (PRAVACHOL ) 20 MG tablet TAKE ONE TABLET BY MOUTH EVERY  DAY 90 tablet 3   sacubitril -valsartan  (ENTRESTO ) 49-51 MG Take 1 tablet by mouth 2 (two) times daily. 60 tablet 0   spironolactone  (ALDACTONE ) 25 MG tablet Take 0.5 tablets (12.5 mg total) by mouth daily. (Patient taking differently: Take 25 mg by mouth daily.) 45 tablet 3   apixaban  (ELIQUIS ) 5 MG TABS tablet Take 1 tablet (5 mg total) by mouth 2 (two) times daily. (Patient not  taking: Reported on 04/14/2024) 60 tablet 11   dapagliflozin  propanediol (FARXIGA ) 10 MG TABS tablet Take 1 tablet (10 mg total) by mouth daily. (Patient not taking: Reported on 04/14/2024) 30 tablet 11   iron  polysaccharides (NIFEREX) 150 MG capsule Take 1 capsule (150 mg total) by mouth daily. (Patient not taking: Reported on 04/14/2024) 30 capsule 2   tamsulosin  (FLOMAX ) 0.4 MG CAPS capsule Take 1 capsule (0.4 mg total) by mouth daily after supper. (Patient not taking: Reported on 04/14/2024) 30 capsule 0   Vitamin D , Ergocalciferol , (DRISDOL ) 1.25 MG (50000 UNIT) CAPS capsule Take 1 capsule (50,000 Units total) by mouth every 7 (seven) days. (Patient not taking: Reported on 04/14/2024) 12 capsule 0   No current facility-administered medications for this visit.    No Known Allergies    Social History   Socioeconomic History   Marital status: Single    Spouse name: Not on file   Number of children: 2   Years of education: Not on file   Highest education level: High school graduate  Occupational History   Occupation: unemployed  Tobacco Use   Smoking status: Former    Current packs/day: 0.50    Average packs/day: 0.3 packs/day for 25.6 years (7.8 ttl pk-yrs)    Types: Cigarettes    Start date: 2020   Smokeless tobacco: Never   Tobacco comments:    Stopped 2 years ago. 2023  Vaping Use   Vaping status: Never Used  Substance and Sexual Activity   Alcohol use: Never   Drug use: Not Currently   Sexual activity: Not Currently  Other Topics Concern   Not on file  Social History Narrative   Patient moved to [place]  from [place]  in March 2020, to take care of his Mother. She lives in a community managed by her church, independent living for seniors. Patient lives with his Mother, does not drive, and reports he mostly keeps to himself.   Social Drivers of Corporate investment banker Strain: High Risk (04/05/2024)   Overall Financial Resource Strain (CARDIA)    Difficulty  of Paying Living Expenses: Hard  Food Insecurity: No Food Insecurity (04/05/2024)   Hunger Vital Sign    Worried About Running Out of Food in the Last Year: Never true    Ran Out of Food in the Last Year: Never true  Transportation Needs: Unmet Transportation Needs (04/05/2024)   PRAPARE - Administrator, Civil Service (Medical): Yes    Lack of Transportation (Non-Medical): Yes  Physical Activity: Insufficiently Active (01/20/2020)   Exercise Vital Sign    Days of Exercise per Week: 2 days    Minutes of Exercise per Session: 30 min  Stress: No Stress Concern Present (08/04/2019)   [employer] of Occupational Health - Occupational Stress Questionnaire    Feeling of Stress : Not at all  Social Connections: Unknown (04/03/2024)   Social Connection and Isolation Panel    Frequency of Communication with Friends and Family: Once a week    Frequency of Social Gatherings with Friends and Family: Once  a week    Attends Religious Services: Never    Active Member of Clubs or Organizations: No    Attends Banker Meetings: Never    Marital Status: Patient declined  Intimate Partner Violence: Not At Risk (04/03/2024)   Humiliation, Afraid, Rape, and Kick questionnaire    Fear of Current or Ex-Partner: No    Emotionally Abused: No    Physically Abused: No    Sexually Abused: No     No family history on file.  Vitals:   04/14/24 0917  BP: (!) 131/90  Pulse: 62  SpO2: 100%  Weight: 161 lb 3.2 oz (73.1 kg)   Wt Readings from Last 3 Encounters:  04/14/24 161 lb 3.2 oz (73.1 kg)  04/09/24 174 lb 6.1 oz (79.1 kg)  08/04/23 156 lb (70.8 kg)   Lab Results  Component Value Date   CREATININE 1.01 04/14/2024   CREATININE 1.13 04/09/2024   CREATININE 0.93 04/08/2024     PHYSICAL EXAM:  General: Well appearing. Wrists handcuffed together and officer present Cor: No JVD. Regular rhythm, rate.  Lungs: clear Abdomen: soft, nontender, nondistended. Extremities: Edema  in right foot (resolving cellulitis) Neuro:. Affect pleasant   ECG: not done   ASSESSMENT & PLAN:  1: NICM with reduced ejection fraction- - suspect due to HTN - NYHA class III - euvolemic today - weight 156 pounds from last visit here 8 months ago - Echo 08/04/2019: EF of 25-30% along with mild/moderate TR.  - Echo 01/13/20: EF of 50-55% with moderate LVH.  - Echo 07/12/23: EF 20-25% with moderate LVH, normal PA pressure of 35.9 mmHg, moderate LAE, mild Kevin, moderate/severe TR - TEE 07/17/23: no thrombus - cMRI 07/18/23:    ECV 33%   1. Mildly dilated LV size, severely reduced LV systolic function. LVEF 26%.    2. There is a thin, midwall, basal septal LGE.   3. Severely reduced RV function.   4. No significant valvular abnormalities.   5. No evidence for myocardial infiltrative disease.   6. Findings consistent with dilated nonischemic cardiomyopathy. - not adding salt and has been trying to read food labels; reviewed the importance of closely following a 2000mg  sodium diet  - continue carvedilol  6.25mg  BID - continue farxiga  10mg  daily - continue furosemide  40mg  daily - resume entresto  49/51mg  BID. BMET next visit - plant to resume MRA at next visit - saw cardiology (Agbor-Etang) 05/21 - BNP 04/03/24 was 3957.9; BNP today  2: HTN- - BP 131/90 - currently receiving [facility] at detention center - [facility] 04/09/24 reviewed: sodium 134, potassium 3.7, creatinine 1.13 and GFR >60 - BMET today  3: COPD-  - using nebulizer and inhalers - no longer smoking  4: PAF- - cardioverted 07/17/23 - continue apixaban  5mg  BID - CBC today   Return in 3-4 weeks, sooner if needed.   Ellouise Class, FNP-C 04/11/24

## 2024-04-14 NOTE — Patient Instructions (Signed)
 Medication Changes:  A paper prescription has been given today. Entresto  49-51mg  (1 tab) two times daily  Lab Work:  We will only call you if the results are abnormal or if the provider would like to make medication changes.  No news is good news.   Follow-Up in: Please follow up with the [facility] Clinic in 4 weeks with Ellouise Class, FNP.   Thank you for choosing [facility].    At the [facility] Clinic, you and your health needs are our priority. We have a designated team specialized in the treatment of Heart Failure. This Care Team includes your primary Heart Failure Specialized Cardiologist (physician), Advanced Practice Providers (APPs- Physician Assistants and Nurse Practitioners), and Pharmacist who all work together to provide you with the care you need, when you need it.   You may see any of the following providers on your designated Care Team at your next follow up:  Dr. Toribio Fuel Dr. Ezra Shuck Dr. Ria Commander Dr. Morene Brownie Ellouise Class, FNP Jaun Bash, RPH-CPP  Please be sure to bring in all your medications bottles to every appointment.   Need to Contact Us :  If you have any questions or concerns before your next appointment please send us  a message through [place] or call our office at [phone].    TO LEAVE A MESSAGE FOR THE NURSE SELECT OPTION 2, PLEASE LEAVE A MESSAGE INCLUDING: YOUR NAME DATE OF BIRTH CALL BACK NUMBER REASON FOR CALL**this is important as we prioritize the call backs  YOU WILL RECEIVE A CALL BACK THE SAME DAY AS LONG AS YOU CALL BEFORE 4:00 PM

## 2024-05-12 ENCOUNTER — Encounter: Admitting: Family

## 2024-05-14 ENCOUNTER — Encounter: Admitting: Family

## 2024-06-18 ENCOUNTER — Emergency Department
Admission: EM | Admit: 2024-06-18 | Discharge: 2024-06-18 | Disposition: A | Discharge status: Home / Self Care | Attending: Emergency Medicine | Admitting: Emergency Medicine

## 2024-06-18 ENCOUNTER — Encounter: Payer: Self-pay | Admitting: Intensive Care

## 2024-06-18 ENCOUNTER — Emergency Department

## 2024-06-18 ENCOUNTER — Other Ambulatory Visit: Payer: Self-pay

## 2024-06-18 ENCOUNTER — Telehealth: Payer: Self-pay | Admitting: Family

## 2024-06-18 DIAGNOSIS — J449 Chronic obstructive pulmonary disease, unspecified: Secondary | ICD-10-CM | POA: Diagnosis not present

## 2024-06-18 DIAGNOSIS — I509 Heart failure, unspecified: Secondary | ICD-10-CM | POA: Diagnosis not present

## 2024-06-18 DIAGNOSIS — I4891 Unspecified atrial fibrillation: Secondary | ICD-10-CM | POA: Insufficient documentation

## 2024-06-18 DIAGNOSIS — M79672 Pain in left foot: Secondary | ICD-10-CM | POA: Insufficient documentation

## 2024-06-18 DIAGNOSIS — I110 Hypertensive heart disease with heart failure: Secondary | ICD-10-CM | POA: Insufficient documentation

## 2024-06-18 DIAGNOSIS — E119 Type 2 diabetes mellitus without complications: Secondary | ICD-10-CM | POA: Diagnosis not present

## 2024-06-18 DIAGNOSIS — M79671 Pain in right foot: Secondary | ICD-10-CM | POA: Diagnosis present

## 2024-06-18 DIAGNOSIS — Z7901 Long term (current) use of anticoagulants: Secondary | ICD-10-CM | POA: Insufficient documentation

## 2024-06-18 LAB — CBC WITH DIFFERENTIAL/PLATELET
Abs Immature Granulocytes: 0.03 K/uL (ref 0.00–0.07)
Basophils Absolute: 0 K/uL (ref 0.0–0.1)
Basophils Relative: 1 %
Eosinophils Absolute: 0.4 K/uL (ref 0.0–0.5)
Eosinophils Relative: 5 %
HCT: 41 % (ref 39.0–52.0)
Hemoglobin: 12.8 g/dL — ABNORMAL LOW (ref 13.0–17.0)
Immature Granulocytes: 0 %
Lymphocytes Relative: 33 %
Lymphs Abs: 2.7 K/uL (ref 0.7–4.0)
MCH: 30.2 pg (ref 26.0–34.0)
MCHC: 31.2 g/dL (ref 30.0–36.0)
MCV: 96.7 fL (ref 80.0–100.0)
Monocytes Absolute: 0.8 K/uL (ref 0.1–1.0)
Monocytes Relative: 9 %
Neutro Abs: 4.3 K/uL (ref 1.7–7.7)
Neutrophils Relative %: 52 %
Platelets: 171 K/uL (ref 150–400)
RBC: 4.24 MIL/uL (ref 4.22–5.81)
RDW: 17.5 % — ABNORMAL HIGH (ref 11.5–15.5)
WBC: 8.3 K/uL (ref 4.0–10.5)
nRBC: 0 % (ref 0.0–0.2)

## 2024-06-18 LAB — COMPREHENSIVE METABOLIC PANEL WITH GFR
ALT: 14 U/L (ref 0–44)
AST: 22 U/L (ref 15–41)
Albumin: 3.4 g/dL — ABNORMAL LOW (ref 3.5–5.0)
Alkaline Phosphatase: 80 U/L (ref 38–126)
Anion gap: 8 (ref 5–15)
BUN: 19 mg/dL (ref 8–23)
CO2: 30 mmol/L (ref 22–32)
Calcium: 8.7 mg/dL — ABNORMAL LOW (ref 8.9–10.3)
Chloride: 103 mmol/L (ref 98–111)
Creatinine, Ser: 1.01 mg/dL (ref 0.61–1.24)
GFR, Estimated: >60 mL/min (ref >60)
Glucose, Bld: 178 mg/dL — ABNORMAL HIGH (ref 70–99)
Potassium: 4.3 mmol/L (ref 3.5–5.1)
Sodium: 141 mmol/L (ref 135–145)
Total Bilirubin: 0.3 mg/dL (ref 0.0–1.2)
Total Protein: 7.5 g/dL (ref 6.5–8.1)

## 2024-06-18 MED ORDER — OXYCODONE-ACETAMINOPHEN 5-325 MG PO TABS
1.0000 | ORAL_TABLET | Freq: Once | ORAL | Status: AC
  Administered 2024-06-18: 1 via ORAL
  Filled 2024-06-18: qty 1

## 2024-06-18 NOTE — ED Provider Notes (Signed)
 [facility] Provider Note    Event Date/Time   First MD Initiated Contact with Patient 06/18/24 2051     (approximate)   History   Chief Complaint Extremity Pain   HPI  Kevin [name] is a 68 y.o. male with past medical history of hypertension, diabetes, atrial fibrillation on Eliquis , CHF, COPD, and gout who presents to the ED complaining of leg pain.  Patient reports that he has been dealing with pain and swelling to both feet for the past couple of months.  He describes prior wounds to the top of both feet and was treated for an infection there a couple of months ago.  He states that the wounds have since healed but he continues to have some swelling and discomfort in the feet.  He states that it makes it difficult for him to walk but he denies any injuries.  He has not had any cough, chest pain, or shortness of breath.  He reports being compliant with his medications but has not yet taken his blood pressure medication this evening.     Physical Exam   Triage Vital Signs: ED Triage Vitals  Encounter Vitals Group     BP 06/18/24 1630 (!) 189/99     Girls Systolic BP Percentile --      Girls Diastolic BP Percentile --      Boys Systolic BP Percentile --      Boys Diastolic BP Percentile --      Pulse Rate 06/18/24 1630 73     Resp 06/18/24 1630 16     Temp 06/18/24 1630 97.7 F (36.5 C)     Temp Source 06/18/24 1630 Oral     SpO2 06/18/24 1630 100 %     Weight 06/18/24 1631 170 lb (77.1 kg)     Height 06/18/24 1631 5' 7.5 (1.715 m)     Head Circumference --      Peak Flow --      Pain Score 06/18/24 1631 7     Pain Loc --      Pain Education --      Exclude from Growth Chart --     Most recent vital signs: Vitals:   06/18/24 1630 06/18/24 2115  BP: (!) 189/99 (!) 196/83  Pulse: 73 72  Resp: 16 18  Temp: 97.7 F (36.5 C) 98.3 F (36.8 C)  SpO2: 100% 100%    Constitutional: Alert and oriented. Eyes: Conjunctivae are normal. Head:  Atraumatic. Nose: No congestion/rhinnorhea. Mouth/Throat: Mucous membranes are moist.  Cardiovascular: Normal rate, regular rhythm. Grossly normal heart sounds.  2+ radial and DP pulses bilaterally. Respiratory: Normal respiratory effort.  No retractions. Lungs CTAB. Gastrointestinal: Soft and nontender. No distention. Musculoskeletal: Healed wounds noted to the tops of bilateral feet with trace pitting edema.  No erythema or warmth noted, tenderness noted around to the bases of both feet. Neurologic:  Normal speech and language. No gross focal neurologic deficits are appreciated.    ED Results / Procedures / Treatments   Labs (all labs ordered are listed, but only abnormal results are displayed) Labs Reviewed  CBC WITH DIFFERENTIAL/PLATELET - Abnormal; Notable for the following components:      Result Value   Hemoglobin 12.8 (*)    RDW 17.5 (*)    All other components within normal limits  COMPREHENSIVE METABOLIC PANEL WITH GFR - Abnormal; Notable for the following components:   Glucose, Bld 178 (*)    Calcium  8.7 (*)  Albumin 3.4 (*)    All other components within normal limits    RADIOLOGY Bilateral foot x-rays reviewed and interpreted by me with no fracture or dislocation.  PROCEDURES:  Critical Care performed: No  Procedures   MEDICATIONS ORDERED IN ED: Medications  oxyCODONE -acetaminophen  (PERCOCET/ROXICET) 5-325 MG per tablet 1 tablet (1 tablet Oral Given 06/18/24 2139)     IMPRESSION / MDM / ASSESSMENT AND PLAN / ED COURSE  I reviewed the triage vital signs and the nursing notes.                              68 y.o. male with past medical history of hypertension, diabetes, atrial fibrillation on Eliquis , CHF, COPD, and gout who presents to the ED complaining of pain and swelling to both feet for multiple months.  Patient's presentation is most consistent with acute presentation with potential threat to life or bodily function.  Differential diagnosis  includes, but is not limited to, cellulitis, abscess, arterial insufficiency, venous insufficiency, DVT, neuropathy, gout.  Patient well-appearing and in no acute distress, vital signs remarkable for hypertension but otherwise reassuring.  He is neurovascular intact to his bilateral lower extremities with 2+ DP pulses, no evidence of infectious process.  He does have trace pitting edema, but low suspicion for DVT as this is equal bilaterally.  No symptoms to suggest significant CHF exacerbation as he denies any difficulty breathing and has oxygen saturations of 100% on room air.  Labs without significant anemia, leukocytosis, electrolyte abnormality, or AKI.  LFTs are unremarkable.  We will check x-rays of bilateral feet, treat symptomatically with Percocet.  Bilateral foot x-rays are negative for acute bony process, right foot imaging does note possible ulceration but no significant ulceration noted on exam.  Pain improved on reassessment and patient is able to ambulate at his baseline.  He is appropriate for discharge home with outpatient follow-up, referrals provided to podiatry and to establish care with PCP.  He was counseled to return to the ED for new or worsening symptoms, patient agrees with plan.      FINAL CLINICAL IMPRESSION(S) / ED DIAGNOSES   Final diagnoses:  Bilateral foot pain     Rx / DC Orders   ED Discharge Orders          Ordered    Ambulatory Referral to Primary Care (Establish Care)        06/18/24 2227             Note:  This document was prepared using Dragon voice recognition software and may include unintentional dictation errors.   Willo Dunnings, MD 06/18/24 2228

## 2024-06-18 NOTE — Telephone Encounter (Signed)
 Called to confirm/remind patient of their appointment at the [facility] on 06/21/24.   Appointment:   [x] Confirmed  [] Left mess   [] No answer/No voice mail  [] VM Full/unable to leave message  [] Phone not in service  Patient reminded to bring all medications and/or complete list.  Confirmed patient has transportation. Gave directions, instructed to utilize valet parking.

## 2024-06-18 NOTE — ED Triage Notes (Signed)
 Patient uses Rolator to get around. Reports he can stand but having extreme pain in bilateral feet. Has wound present on top of each foot. 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

## 2024-06-20 NOTE — Progress Notes (Unsigned)
 Advanced [facility] Failure Clinic Note    Referring Physician: 08/25 admission PCP: Pcp, No Cardiologist: Redell Cave, MD   HPI:  Kevin [name] is a 68 y/o male with a history of HTN, dyslipidemia, transaminitis, vitamin D  deficiency, T2DM, atrial flutter/ fibrillation, (s/p cardioversion 07/17/23), COPD, iron  deficient anemia (08/25), tobacco use and chronic heart failure.   Echo 08/04/2019: EF of 25-30% along with mild/moderate TR.  Echo 01/13/20: EF of 50-55% with moderate LVH.   Admitted 07/10/23 due to worsening shortness of breath, orthopnea, PND and worsening swelling in legs/ feet. Found to have HF exacerbation. Echo 07/12/2023 showed EF estimated at 20 to 25%, moderate LVH, moderately reduced RV function, indeterminate LV diastolic parameters, moderate to severe tricuspid regurgitation.  TEE 07/17/23: no thrombus. Cardioverted 07/17/23. Cardiac MRI 07/18/23: showed severely reduced RV function and LV systolic function but no evidence of myocardial infiltrative disease.  Admitted 04/03/24 with increasing shortness of breath and lower extremity swelling for the last month. Has been in [facility] since March 2025. BP in the ED was 185/125 and oxygen saturation was 97% on room air. Chest x-ray showed cardiomegaly and pulmonary congestion. IV diuresed with lasix  drip with transition to oral diuretics. X-ray Left foot: No fracture, nonspecific edema. Augmentin  given for possible foot cellulitis. NSVT on telemetry on 04/04/2024. Elevated liver enzymes: Probably from hepatic congestion from CHF. Liver ultrasound did not show any acute findings.Tsat 6%, Venofer  300 mg IV one-time dose given.   He presents today with a detention officer & handcuffed, for a HF hospital follow-up visit with a chief complaint of minimal shortness of breath. Continues with pedal edema in his feet. Denies chest pain, palpitations, dizziness. Tearful in the office because he says that he's overjoyed with being  out.    ROS: All systems negative except what is listed in HPI, PMH and Problem List  Past Medical History:  Diagnosis Date   CHF (congestive heart failure) (HCC)    COPD (chronic obstructive pulmonary disease) (HCC)    Diabetes mellitus without complication (HCC)    Heart failure with mid-range ejection fraction (HCC)    a. 2019 reported neg stress test and nl cors on cath in [facility]; b. 07/2019 Echo: EF 25-30%, Gr2 DD, glob HK. Mod reduced RV fxn w/ volume overload. Mod dil RA. Mild to mod TR. Mod elev PASP; b. 12/2019 Echo: EF 50-55%; c. 05/2020 Echo: EF 45-50%, glob HK, sev LVH, GrII DD, nl RV size/fxn, mildly BAE, mild AoV sclerosis, mildly dilated Ao root.   Hypertension    NICM (nonischemic cardiomyopathy) (HCC)    Noncompliance    Pleural effusion on right    a. 07/2019 Thoracentesis: 300 ml.   Tobacco abuse     Current Outpatient Medications  Medication Sig Dispense Refill   apixaban  (ELIQUIS ) 5 MG TABS tablet Take 1 tablet (5 mg total) by mouth 2 (two) times daily. 60 tablet 11   ascorbic acid  (VITAMIN C ) 500 MG tablet Take 1 tablet (500 mg total) by mouth daily. 30 tablet 2   Blood Pressure KIT 1 kit by Does not apply route daily. 1 kit 0   carvedilol  (COREG ) 6.25 MG tablet Take 1 tablet (6.25 mg total) by mouth 2 (two) times daily. 60 tablet 11   dapagliflozin  propanediol (FARXIGA ) 10 MG TABS tablet Take 1 tablet (10 mg total) by mouth daily. 30 tablet 11   furosemide  (LASIX ) 40 MG tablet Take 1 tablet (40 mg total) by mouth daily. 30 tablet 11  iron  polysaccharides (NIFEREX) 150 MG capsule Take 1 capsule (150 mg total) by mouth daily. (Patient not taking: Reported on 04/14/2024) 30 capsule 2   pravastatin  (PRAVACHOL ) 20 MG tablet TAKE ONE TABLET BY MOUTH EVERY DAY (Patient not taking: Reported on 04/14/2024) 90 tablet 3   sacubitril -valsartan  (ENTRESTO ) 49-51 MG Take 1 tablet by mouth 2 (two) times daily. 60 tablet 3   spironolactone  (ALDACTONE ) 25 MG tablet Take 0.5  tablets (12.5 mg total) by mouth daily. (Patient not taking: Reported on 04/14/2024) 45 tablet 3   tamsulosin  (FLOMAX ) 0.4 MG CAPS capsule Take 1 capsule (0.4 mg total) by mouth daily after supper. (Patient not taking: Reported on 04/14/2024) 30 capsule 0   Vitamin D , Ergocalciferol , (DRISDOL ) 1.25 MG (50000 UNIT) CAPS capsule Take 1 capsule (50,000 Units total) by mouth every 7 (seven) days. 12 capsule 0   No current facility-administered medications for this visit.    No Known Allergies    Social History   Socioeconomic History   Marital status: Single    Spouse name: Not on file   Number of children: 2   Years of education: Not on file   Highest education level: High school graduate  Occupational History   Occupation: unemployed  Tobacco Use   Smoking status: Former    Current packs/day: 0.50    Average packs/day: 0.3 packs/day for 25.8 years (7.9 ttl pk-yrs)    Types: Cigarettes    Start date: 2020   Smokeless tobacco: Never   Tobacco comments:    Stopped 2 years ago. 2023  Vaping Use   Vaping status: Never Used  Substance and Sexual Activity   Alcohol use: Never   Drug use: Not Currently   Sexual activity: Not Currently  Other Topics Concern   Not on file  Social History Narrative   Patient moved to [place]  from [place]  in March 2020, to take care of his Mother. She lives in a community managed by her church, independent living for seniors. Patient lives with his Mother, does not drive, and reports he mostly keeps to himself.   Social Drivers of Corporate Investment Banker Strain: High Risk (04/05/2024)   Overall Financial Resource Strain (CARDIA)    Difficulty of Paying Living Expenses: Hard  Food Insecurity: No Food Insecurity (04/05/2024)   Hunger Vital Sign    Worried About Running Out of Food in the Last Year: Never true    Ran Out of Food in the Last Year: Never true  Transportation Needs: Unmet Transportation Needs (04/05/2024)   PRAPARE -  Transportation    Lack of Transportation (Medical): Yes    Lack of Transportation (Non-Medical): Yes  Physical Activity: Insufficiently Active (01/20/2020)   Exercise Vital Sign    Days of Exercise per Week: 2 days    Minutes of Exercise per Session: 30 min  Stress: No Stress Concern Present (08/04/2019)   [employer] of Occupational Health - Occupational Stress Questionnaire    Feeling of Stress : Not at all  Social Connections: Unknown (04/03/2024)   Social Connection and Isolation Panel    Frequency of Communication with Friends and Family: Once a week    Frequency of Social Gatherings with Friends and Family: Once a week    Attends Religious Services: Never    Database Administrator or Organizations: No    Attends Banker Meetings: Never    Marital Status: Patient declined  Intimate Partner Violence: Not At Risk (04/03/2024)   Humiliation, Afraid, Rape,  and Kick questionnaire    Fear of Current or Ex-Partner: No    Emotionally Abused: No    Physically Abused: No    Sexually Abused: No     No family history on file.  There were no vitals filed for this visit.  Wt Readings from Last 3 Encounters:  06/18/24 170 lb (77.1 kg)  04/14/24 161 lb 3.2 oz (73.1 kg)  04/09/24 174 lb 6.1 oz (79.1 kg)   Lab Results  Component Value Date   CREATININE 1.01 06/18/2024   CREATININE 1.01 04/14/2024   CREATININE 1.13 04/09/2024     PHYSICAL EXAM:  General: Well appearing. Wrists handcuffed together and officer present Cor: No JVD. Regular rhythm, rate.  Lungs: clear Abdomen: soft, nontender, nondistended. Extremities: Edema in right foot (resolving cellulitis) Neuro:. Affect pleasant   ECG: not done   ASSESSMENT & PLAN:  1: NICM with reduced ejection fraction- - suspect due to HTN - NYHA class III - euvolemic today - weight 156 pounds from last visit here 8 months ago - Echo 08/04/2019: EF of 25-30% along with mild/moderate TR.  - Echo 01/13/20: EF of 50-55%  with moderate LVH.  - Echo 07/12/23: EF 20-25% with moderate LVH, normal PA pressure of 35.9 mmHg, moderate LAE, mild Kevin, moderate/severe TR - TEE 07/17/23: no thrombus - cMRI 07/18/23:    ECV 33%   1. Mildly dilated LV size, severely reduced LV systolic function. LVEF 26%.    2. There is a thin, midwall, basal septal LGE.   3. Severely reduced RV function.   4. No significant valvular abnormalities.   5. No evidence for myocardial infiltrative disease.   6. Findings consistent with dilated nonischemic cardiomyopathy. - not adding salt and has been trying to read food labels; reviewed the importance of closely following a 2000mg  sodium diet  - continue carvedilol  6.25mg  BID - continue farxiga  10mg  daily - continue furosemide  40mg  daily - resume entresto  49/51mg  BID. BMET next visit - plant to resume MRA at next visit - saw cardiology (Agbor-Etang) 05/21 - BNP 04/03/24 was 3957.9; BNP today  2: HTN- - BP 131/90 - currently [facility] at [facility] 04/09/24 reviewed: sodium 134, potassium 3.7, creatinine 1.13 and GFR >60 - BMET today  3: COPD-  - using nebulizer and inhalers - no longer smoking  4: PAF- - cardioverted 07/17/23 - continue apixaban  5mg  BID - CBC today   Return in 3-4 weeks, sooner if needed.   Ellouise Class, FNP-C 04/11/24

## 2024-06-21 ENCOUNTER — Encounter: Payer: Self-pay | Admitting: Family

## 2024-06-21 ENCOUNTER — Ambulatory Visit: Attending: Family | Admitting: Family

## 2024-06-21 VITALS — BP 100/69 | HR 67 | Wt 181.0 lb

## 2024-06-21 DIAGNOSIS — I10 Essential (primary) hypertension: Secondary | ICD-10-CM

## 2024-06-21 DIAGNOSIS — I071 Rheumatic tricuspid insufficiency: Secondary | ICD-10-CM | POA: Diagnosis not present

## 2024-06-21 DIAGNOSIS — M79671 Pain in right foot: Secondary | ICD-10-CM | POA: Diagnosis not present

## 2024-06-21 DIAGNOSIS — I428 Other cardiomyopathies: Secondary | ICD-10-CM | POA: Insufficient documentation

## 2024-06-21 DIAGNOSIS — I480 Paroxysmal atrial fibrillation: Secondary | ICD-10-CM | POA: Diagnosis not present

## 2024-06-21 DIAGNOSIS — Z87891 Personal history of nicotine dependence: Secondary | ICD-10-CM | POA: Insufficient documentation

## 2024-06-21 DIAGNOSIS — Y929 Unspecified place or not applicable: Secondary | ICD-10-CM | POA: Diagnosis not present

## 2024-06-21 DIAGNOSIS — J449 Chronic obstructive pulmonary disease, unspecified: Secondary | ICD-10-CM | POA: Insufficient documentation

## 2024-06-21 DIAGNOSIS — D509 Iron deficiency anemia, unspecified: Secondary | ICD-10-CM | POA: Diagnosis not present

## 2024-06-21 DIAGNOSIS — I110 Hypertensive heart disease with heart failure: Secondary | ICD-10-CM | POA: Diagnosis not present

## 2024-06-21 DIAGNOSIS — Z79899 Other long term (current) drug therapy: Secondary | ICD-10-CM | POA: Diagnosis not present

## 2024-06-21 DIAGNOSIS — E785 Hyperlipidemia, unspecified: Secondary | ICD-10-CM | POA: Insufficient documentation

## 2024-06-21 DIAGNOSIS — I5022 Chronic systolic (congestive) heart failure: Secondary | ICD-10-CM | POA: Insufficient documentation

## 2024-06-21 DIAGNOSIS — Z7901 Long term (current) use of anticoagulants: Secondary | ICD-10-CM | POA: Insufficient documentation

## 2024-06-21 DIAGNOSIS — I4892 Unspecified atrial flutter: Secondary | ICD-10-CM | POA: Diagnosis not present

## 2024-06-21 DIAGNOSIS — E559 Vitamin D deficiency, unspecified: Secondary | ICD-10-CM | POA: Diagnosis not present

## 2024-06-21 DIAGNOSIS — E119 Type 2 diabetes mellitus without complications: Secondary | ICD-10-CM | POA: Diagnosis not present

## 2024-06-21 DIAGNOSIS — S91302A Unspecified open wound, left foot, initial encounter: Secondary | ICD-10-CM | POA: Diagnosis not present

## 2024-06-21 DIAGNOSIS — M79672 Pain in left foot: Secondary | ICD-10-CM | POA: Diagnosis not present

## 2024-06-21 DIAGNOSIS — Z7984 Long term (current) use of oral hypoglycemic drugs: Secondary | ICD-10-CM | POA: Insufficient documentation

## 2024-06-21 DIAGNOSIS — Y939 Activity, unspecified: Secondary | ICD-10-CM | POA: Diagnosis not present

## 2024-06-21 DIAGNOSIS — M7989 Other specified soft tissue disorders: Secondary | ICD-10-CM | POA: Diagnosis not present

## 2024-06-21 NOTE — Patient Instructions (Signed)
 It was good to see you today!  Medication Changes:  No medication changes today!   Testing/Procedures:  Your physician has requested that you have an echocardiogram. Echocardiography is a painless test that uses sound waves to create images of your heart. It provides your doctor with information about the size and shape of your heart and how well your heart's chambers and valves are working. This procedure takes approximately one hour. There are no restrictions for this procedure. Please do NOT wear cologne, perfume, aftershave, or lotions (deodorant is allowed). Please arrive 15 minutes prior to your appointment time.  Please note: We ask at that you not bring children with you during ultrasound (echo/ vascular) testing. Due to room size and safety concerns, children are not allowed in the ultrasound rooms during exams. Our front office staff cannot provide observation of children in our lobby area while testing is being conducted. An adult accompanying a patient to their appointment will only be allowed in the ultrasound room at the discretion of the ultrasound technician under special circumstances. We apologize for any inconvenience.   Someone will contact you in order to schedule this appointment.   Follow-Up in: Please follow up with the Advanced Heart Failure Clinic in 1 month with Ellouise Class, FNP.   Thank you for choosing [facility].    At the Advanced Heart Failure Clinic, you and your health needs are our priority. We have a designated team specialized in the treatment of Heart Failure. This Care Team includes your primary Heart Failure Specialized Cardiologist (physician), Advanced Practice Providers (APPs- Physician Assistants and Nurse Practitioners), and Pharmacist who all work together to provide you with the care you need, when you need it.   You may see any of the following providers on your designated Care Team at your next follow  up:  Dr. Toribio Fuel Dr. Ezra Shuck Dr. Ria Commander Dr. Morene Brownie Ellouise Class, FNP Jaun Bash, RPH-CPP  Please be sure to bring in all your medications bottles to every appointment.   Need to Contact Us :  If you have any questions or concerns before your next appointment please send us  a message through [facility] or call our office at [phone].    TO LEAVE A MESSAGE FOR THE NURSE SELECT OPTION 2, PLEASE LEAVE A MESSAGE INCLUDING: YOUR NAME DATE OF BIRTH CALL BACK NUMBER REASON FOR CALL**this is important as we prioritize the call backs  YOU WILL RECEIVE A CALL BACK THE SAME DAY AS LONG AS YOU CALL BEFORE 4:00 PM

## 2024-06-28 ENCOUNTER — Telehealth: Payer: Self-pay | Admitting: Family

## 2024-06-29 MED ORDER — DAPAGLIFLOZIN PROPANEDIOL 10 MG PO TABS: 10.0000 mg | ORAL_TABLET | Freq: Every day | ORAL | 3 refills | Status: DC

## 2024-06-29 MED ORDER — APIXABAN 5 MG PO TABS: 5.0000 mg | ORAL_TABLET | Freq: Two times a day (BID) | ORAL | 3 refills | Status: DC

## 2024-06-29 MED ORDER — FUROSEMIDE 40 MG PO TABS: 40.0000 mg | ORAL_TABLET | Freq: Every day | ORAL | 3 refills | Status: DC

## 2024-06-29 MED ORDER — SACUBITRIL-VALSARTAN 49-51 MG PO TABS: 1.0000 | ORAL_TABLET | Freq: Two times a day (BID) | ORAL | 3 refills | Status: DC

## 2024-06-29 MED ORDER — SPIRONOLACTONE 25 MG PO TABS: 12.5000 mg | ORAL_TABLET | Freq: Every day | ORAL | 3 refills | Status: DC

## 2024-06-29 MED ORDER — CARVEDILOL 25 MG PO TABS: 25.0000 mg | ORAL_TABLET | Freq: Two times a day (BID) | ORAL | 3 refills | Status: DC

## 2024-06-29 MED ORDER — ATORVASTATIN CALCIUM 20 MG PO TABS: 20.0000 mg | ORAL_TABLET | Freq: Every day | ORAL | 3 refills | Status: DC

## 2024-06-29 NOTE — Telephone Encounter (Signed)
 Meds sent to pharmacy as requested

## 2024-07-09 ENCOUNTER — Emergency Department

## 2024-07-09 ENCOUNTER — Inpatient Hospital Stay
Admission: EM | Admit: 2024-07-09 | Discharge: 2024-07-13 | DRG: 291 | Disposition: A | Discharge status: Home / Self Care | Attending: Osteopathic Medicine | Admitting: Osteopathic Medicine

## 2024-07-09 ENCOUNTER — Other Ambulatory Visit: Payer: Self-pay

## 2024-07-09 DIAGNOSIS — I5A Non-ischemic myocardial injury (non-traumatic): Secondary | ICD-10-CM | POA: Diagnosis present

## 2024-07-09 DIAGNOSIS — I160 Hypertensive urgency: Secondary | ICD-10-CM | POA: Diagnosis present

## 2024-07-09 DIAGNOSIS — E1140 Type 2 diabetes mellitus with diabetic neuropathy, unspecified: Secondary | ICD-10-CM | POA: Diagnosis present

## 2024-07-09 DIAGNOSIS — J449 Chronic obstructive pulmonary disease, unspecified: Secondary | ICD-10-CM | POA: Diagnosis present

## 2024-07-09 DIAGNOSIS — Z7984 Long term (current) use of oral hypoglycemic drugs: Secondary | ICD-10-CM

## 2024-07-09 DIAGNOSIS — E785 Hyperlipidemia, unspecified: Secondary | ICD-10-CM | POA: Diagnosis present

## 2024-07-09 DIAGNOSIS — I509 Heart failure, unspecified: Secondary | ICD-10-CM

## 2024-07-09 DIAGNOSIS — I2489 Other forms of acute ischemic heart disease: Secondary | ICD-10-CM | POA: Diagnosis present

## 2024-07-09 DIAGNOSIS — I110 Hypertensive heart disease with heart failure: Principal | ICD-10-CM | POA: Diagnosis present

## 2024-07-09 DIAGNOSIS — I480 Paroxysmal atrial fibrillation: Secondary | ICD-10-CM | POA: Diagnosis present

## 2024-07-09 DIAGNOSIS — J439 Emphysema, unspecified: Secondary | ICD-10-CM

## 2024-07-09 DIAGNOSIS — R7989 Other specified abnormal findings of blood chemistry: Secondary | ICD-10-CM | POA: Diagnosis present

## 2024-07-09 DIAGNOSIS — R601 Generalized edema: Secondary | ICD-10-CM | POA: Diagnosis present

## 2024-07-09 DIAGNOSIS — I428 Other cardiomyopathies: Secondary | ICD-10-CM | POA: Diagnosis present

## 2024-07-09 DIAGNOSIS — Z87891 Personal history of nicotine dependence: Secondary | ICD-10-CM | POA: Diagnosis not present

## 2024-07-09 DIAGNOSIS — I5023 Acute on chronic systolic (congestive) heart failure: Secondary | ICD-10-CM | POA: Diagnosis present

## 2024-07-09 DIAGNOSIS — Z7901 Long term (current) use of anticoagulants: Secondary | ICD-10-CM | POA: Diagnosis not present

## 2024-07-09 DIAGNOSIS — Z91148 Patient's other noncompliance with medication regimen for other reason: Secondary | ICD-10-CM | POA: Diagnosis not present

## 2024-07-09 DIAGNOSIS — N400 Enlarged prostate without lower urinary tract symptoms: Secondary | ICD-10-CM | POA: Diagnosis present

## 2024-07-09 DIAGNOSIS — Z79899 Other long term (current) drug therapy: Secondary | ICD-10-CM | POA: Diagnosis not present

## 2024-07-09 DIAGNOSIS — K761 Chronic passive congestion of liver: Secondary | ICD-10-CM | POA: Diagnosis present

## 2024-07-09 DIAGNOSIS — E663 Overweight: Secondary | ICD-10-CM | POA: Diagnosis present

## 2024-07-09 DIAGNOSIS — Z6827 Body mass index (BMI) 27.0-27.9, adult: Secondary | ICD-10-CM | POA: Diagnosis not present

## 2024-07-09 DIAGNOSIS — I5021 Acute systolic (congestive) heart failure: Secondary | ICD-10-CM | POA: Diagnosis not present

## 2024-07-09 DIAGNOSIS — I10 Essential (primary) hypertension: Secondary | ICD-10-CM

## 2024-07-09 LAB — COMPREHENSIVE METABOLIC PANEL WITH GFR
ALT: 180 U/L — ABNORMAL HIGH (ref 0–44)
AST: 39 U/L (ref 15–41)
Albumin: 3.6 g/dL (ref 3.5–5.0)
Alkaline Phosphatase: 117 U/L (ref 38–126)
Anion gap: 9 (ref 5–15)
BUN: 22 mg/dL (ref 8–23)
CO2: 26 mmol/L (ref 22–32)
Calcium: 8.7 mg/dL — ABNORMAL LOW (ref 8.9–10.3)
Chloride: 111 mmol/L (ref 98–111)
Creatinine, Ser: 0.97 mg/dL (ref 0.61–1.24)
GFR, Estimated: >60 mL/min (ref >60)
Glucose, Bld: 106 mg/dL — ABNORMAL HIGH (ref 70–99)
Potassium: 3.7 mmol/L (ref 3.5–5.1)
Sodium: 147 mmol/L — ABNORMAL HIGH (ref 135–145)
Total Bilirubin: 1 mg/dL (ref 0.0–1.2)
Total Protein: 6.5 g/dL (ref 6.5–8.1)

## 2024-07-09 LAB — CBC WITH DIFFERENTIAL/PLATELET
Abs Immature Granulocytes: 0.02 K/uL (ref 0.00–0.07)
Basophils Absolute: 0 K/uL (ref 0.0–0.1)
Basophils Relative: 1 %
Eosinophils Absolute: 0.3 K/uL (ref 0.0–0.5)
Eosinophils Relative: 4 %
HCT: 35.5 % — ABNORMAL LOW (ref 39.0–52.0)
Hemoglobin: 11.5 g/dL — ABNORMAL LOW (ref 13.0–17.0)
Immature Granulocytes: 0 %
Lymphocytes Relative: 39 %
Lymphs Abs: 2.6 K/uL (ref 0.7–4.0)
MCH: 30.6 pg (ref 26.0–34.0)
MCHC: 32.4 g/dL (ref 30.0–36.0)
MCV: 94.4 fL (ref 80.0–100.0)
Monocytes Absolute: 0.8 K/uL (ref 0.1–1.0)
Monocytes Relative: 12 %
Neutro Abs: 2.9 K/uL (ref 1.7–7.7)
Neutrophils Relative %: 44 %
Platelets: 169 K/uL (ref 150–400)
RBC: 3.76 MIL/uL — ABNORMAL LOW (ref 4.22–5.81)
RDW: 21.2 % — ABNORMAL HIGH (ref 11.5–15.5)
Smear Review: NORMAL
WBC: 6.6 K/uL (ref 4.0–10.5)
nRBC: 0 % (ref 0.0–0.2)

## 2024-07-09 LAB — MAGNESIUM: Magnesium: 2 mg/dL (ref 1.7–2.4)

## 2024-07-09 LAB — PRO BRAIN NATRIURETIC PEPTIDE: Pro Brain Natriuretic Peptide: 11526 pg/mL — ABNORMAL HIGH (ref <300.0)

## 2024-07-09 LAB — TROPONIN T, HIGH SENSITIVITY
Troponin T High Sensitivity: 32 ng/L — ABNORMAL HIGH (ref 0–19)
Troponin T High Sensitivity: 34 ng/L — ABNORMAL HIGH (ref 0–19)

## 2024-07-09 LAB — CBG MONITORING, ED: Glucose-Capillary: 135 mg/dL — ABNORMAL HIGH (ref 70–99)

## 2024-07-09 MED ORDER — ATORVASTATIN CALCIUM 20 MG PO TABS
20.0000 mg | ORAL_TABLET | Freq: Every day | ORAL | Status: DC
  Administered 2024-07-10 – 2024-07-13 (×4): 20 mg via ORAL
  Filled 2024-07-09 (×4): qty 1

## 2024-07-09 MED ORDER — HYDRALAZINE HCL 20 MG/ML IJ SOLN
10.0000 mg | INTRAMUSCULAR | Status: DC | PRN
  Administered 2024-07-10 – 2024-07-11 (×6): 10 mg via INTRAVENOUS
  Filled 2024-07-09 (×8): qty 1

## 2024-07-09 MED ORDER — ALBUTEROL SULFATE (2.5 MG/3ML) 0.083% IN NEBU: 2.5000 mg | INHALATION_SOLUTION | RESPIRATORY_TRACT | Status: DC | PRN

## 2024-07-09 MED ORDER — NITROGLYCERIN 0.1 MG/HR TD PT24
0.1000 mg | MEDICATED_PATCH | Freq: Once | TRANSDERMAL | Status: AC
  Administered 2024-07-09: 0.1 mg via TRANSDERMAL
  Filled 2024-07-09: qty 1

## 2024-07-09 MED ORDER — DAPAGLIFLOZIN PROPANEDIOL 10 MG PO TABS: 10.0000 mg | ORAL_TABLET | Freq: Every day | ORAL | 1 refills | Status: DC

## 2024-07-09 MED ORDER — SACUBITRIL-VALSARTAN 49-51 MG PO TABS
1.0000 | ORAL_TABLET | Freq: Two times a day (BID) | ORAL | Status: DC
  Administered 2024-07-10 – 2024-07-12 (×5): 1 via ORAL
  Filled 2024-07-09 (×5): qty 1

## 2024-07-09 MED ORDER — DM-GUAIFENESIN ER 30-600 MG PO TB12: 1.0000 | ORAL_TABLET | Freq: Two times a day (BID) | ORAL | Status: DC | PRN

## 2024-07-09 MED ORDER — POLYSACCHARIDE IRON COMPLEX 150 MG PO CAPS
150.0000 mg | ORAL_CAPSULE | Freq: Every day | ORAL | Status: DC
  Administered 2024-07-10 – 2024-07-12 (×3): 150 mg via ORAL
  Filled 2024-07-09 (×4): qty 1

## 2024-07-09 MED ORDER — FUROSEMIDE 10 MG/ML IJ SOLN
80.0000 mg | Freq: Once | INTRAMUSCULAR | Status: AC
  Administered 2024-07-09: 80 mg via INTRAVENOUS
  Filled 2024-07-09: qty 8

## 2024-07-09 MED ORDER — INSULIN ASPART 100 UNIT/ML IJ SOLN: 0.0000 [IU] | Freq: Three times a day (TID) | INTRAMUSCULAR | Status: DC

## 2024-07-09 MED ORDER — SPIRONOLACTONE 12.5 MG HALF TABLET
12.5000 mg | ORAL_TABLET | Freq: Every day | ORAL | Status: DC
  Filled 2024-07-09: qty 1

## 2024-07-09 MED ORDER — SACUBITRIL-VALSARTAN 49-51 MG PO TABS
1.0000 | ORAL_TABLET | Freq: Once | ORAL | Status: AC
  Administered 2024-07-09: 1 via ORAL
  Filled 2024-07-09: qty 1

## 2024-07-09 MED ORDER — VITAMIN C 500 MG PO TABS
500.0000 mg | ORAL_TABLET | Freq: Every day | ORAL | Status: DC
  Administered 2024-07-10 – 2024-07-13 (×4): 500 mg via ORAL
  Filled 2024-07-09 (×4): qty 1

## 2024-07-09 MED ORDER — APIXABAN 5 MG PO TABS
5.0000 mg | ORAL_TABLET | Freq: Two times a day (BID) | ORAL | Status: DC
  Administered 2024-07-10 – 2024-07-13 (×7): 5 mg via ORAL
  Filled 2024-07-09 (×7): qty 1

## 2024-07-09 MED ORDER — CARVEDILOL 25 MG PO TABS
25.0000 mg | ORAL_TABLET | Freq: Two times a day (BID) | ORAL | Status: DC
  Administered 2024-07-10 – 2024-07-13 (×7): 25 mg via ORAL
  Filled 2024-07-09: qty 4
  Filled 2024-07-09 (×3): qty 1
  Filled 2024-07-09 (×3): qty 4
  Filled 2024-07-09: qty 1

## 2024-07-09 MED ORDER — IBUPROFEN 400 MG PO TABS: 200.0000 mg | ORAL_TABLET | Freq: Four times a day (QID) | ORAL | Status: DC | PRN

## 2024-07-09 MED ORDER — POTASSIUM CHLORIDE CRYS ER 20 MEQ PO TBCR
40.0000 meq | EXTENDED_RELEASE_TABLET | Freq: Once | ORAL | Status: AC
  Administered 2024-07-09: 40 meq via ORAL
  Filled 2024-07-09: qty 2

## 2024-07-09 MED ORDER — ATORVASTATIN CALCIUM 20 MG PO TABS
20.0000 mg | ORAL_TABLET | Freq: Once | ORAL | Status: AC
  Administered 2024-07-09: 20 mg via ORAL
  Filled 2024-07-09: qty 1

## 2024-07-09 MED ORDER — DAPAGLIFLOZIN PROPANEDIOL 10 MG PO TABS
10.0000 mg | ORAL_TABLET | Freq: Once | ORAL | Status: AC
  Administered 2024-07-09: 10 mg via ORAL
  Filled 2024-07-09: qty 1

## 2024-07-09 MED ORDER — APIXABAN 5 MG PO TABS: 5.0000 mg | ORAL_TABLET | Freq: Two times a day (BID) | ORAL | 1 refills | Status: DC

## 2024-07-09 MED ORDER — ALBUTEROL SULFATE HFA 108 (90 BASE) MCG/ACT IN AERS: 2.0000 | INHALATION_SPRAY | RESPIRATORY_TRACT | Status: DC | PRN

## 2024-07-09 MED ORDER — CARVEDILOL 25 MG PO TABS: 25.0000 mg | ORAL_TABLET | Freq: Two times a day (BID) | ORAL | 1 refills | Status: DC

## 2024-07-09 MED ORDER — FUROSEMIDE 10 MG/ML IJ SOLN
40.0000 mg | Freq: Two times a day (BID) | INTRAMUSCULAR | Status: DC
  Administered 2024-07-10 – 2024-07-13 (×7): 40 mg via INTRAVENOUS
  Filled 2024-07-09 (×7): qty 4

## 2024-07-09 MED ORDER — SPIRONOLACTONE 25 MG PO TABS: 25.0000 mg | ORAL_TABLET | Freq: Every day | ORAL | 1 refills | Status: DC

## 2024-07-09 MED ORDER — ONDANSETRON HCL 4 MG/2ML IJ SOLN
4.0000 mg | Freq: Three times a day (TID) | INTRAMUSCULAR | Status: DC | PRN
Start: 2024-07-09 — End: 2024-07-13

## 2024-07-09 MED ORDER — SACUBITRIL-VALSARTAN 49-51 MG PO TABS: 1.0000 | ORAL_TABLET | Freq: Two times a day (BID) | ORAL | 1 refills | Status: DC

## 2024-07-09 MED ORDER — ATORVASTATIN CALCIUM 20 MG PO TABS: 20.0000 mg | ORAL_TABLET | Freq: Every day | ORAL | 1 refills | Status: DC

## 2024-07-09 MED ORDER — SPIRONOLACTONE 25 MG PO TABS
25.0000 mg | ORAL_TABLET | Freq: Once | ORAL | Status: AC
  Administered 2024-07-09: 25 mg via ORAL
  Filled 2024-07-09: qty 1

## 2024-07-09 MED ORDER — ALBUTEROL SULFATE HFA 108 (90 BASE) MCG/ACT IN AERS: 2.0000 | INHALATION_SPRAY | Freq: Four times a day (QID) | RESPIRATORY_TRACT | 2 refills | Status: DC | PRN

## 2024-07-09 MED ORDER — TAMSULOSIN HCL 0.4 MG PO CAPS: 0.4000 mg | ORAL_CAPSULE | Freq: Every day | ORAL | Status: DC

## 2024-07-09 MED ORDER — APIXABAN 5 MG PO TABS
5.0000 mg | ORAL_TABLET | Freq: Once | ORAL | Status: AC
  Administered 2024-07-09: 5 mg via ORAL
  Filled 2024-07-09: qty 1

## 2024-07-09 MED ORDER — INSULIN ASPART 100 UNIT/ML IJ SOLN: 0.0000 [IU] | Freq: Every day | INTRAMUSCULAR | Status: DC

## 2024-07-09 MED ORDER — CARVEDILOL 25 MG PO TABS
25.0000 mg | ORAL_TABLET | Freq: Once | ORAL | Status: AC
  Administered 2024-07-09: 25 mg via ORAL
  Filled 2024-07-09: qty 1

## 2024-07-09 MED ORDER — TAMSULOSIN HCL 0.4 MG PO CAPS
0.4000 mg | ORAL_CAPSULE | Freq: Every day | ORAL | Status: DC
  Administered 2024-07-10 – 2024-07-12 (×3): 0.4 mg via ORAL
  Filled 2024-07-09 (×3): qty 1

## 2024-07-09 NOTE — ED Notes (Signed)
 Called CCMD to initiate cardiac monitoring.

## 2024-07-09 NOTE — ED Provider Notes (Signed)
 [facility] Provider Note    Event Date/Time   First MD Initiated Contact with Patient 07/09/24 1501     (approximate)   History   Facial Swelling   HPI  Kevin [name] is a 68 y.o. male who presents to the ED for evaluation of Facial Swelling   I reviewed a cardiology clinic visit from 10/27.  History of HTN, atrial flutter s/p cardioversion, COPD, tobacco abuse, CHF.  Anticoagulated on Eliquis .  Patient presents to the ED for evaluation of multiple areas of swelling.  He reports he did not realize swelling to his face but had multiple people telling his face seemed somewhat swollen and his eyelids seemed swollen.  Also reports swelling to his lower extremities, increased cough.  Denies chest pain or shortness of breath.  Acknowledges he has not had any of his home/prescription medications for the past 3-4 days.  Recently acquired housing, moved into a new house with multiple roommates last week.    Physical Exam   Triage Vital Signs: ED Triage Vitals  Encounter Vitals Group     BP 07/09/24 1134 (!) 207/115     Girls Systolic BP Percentile --      Girls Diastolic BP Percentile --      Boys Systolic BP Percentile --      Boys Diastolic BP Percentile --      Pulse Rate 07/09/24 1134 67     Resp 07/09/24 1134 17     Temp 07/09/24 1134 (!) 97.5 F (36.4 C)     Temp Source 07/09/24 1134 Oral     SpO2 07/09/24 1134 98 %     Weight 07/09/24 1136 175 lb (79.4 kg)     Height 07/09/24 1136 5' 7 (1.702 m)     Head Circumference --      Peak Flow --      Pain Score 07/09/24 1135 0     Pain Loc --      Pain Education --      Exclude from Growth Chart --     Most recent vital signs: Vitals:   07/09/24 1746 07/09/24 1846  BP: (!) 206/136   Pulse: 66   Resp: 16   Temp:  (!) 97.3 F (36.3 C)  SpO2: 96%     General: Awake, no distress.  Sitting upright, pleasant and conversational, well-appearing CV:  Good peripheral perfusion.  Resp:  Normal  effort.  Good airflow without wheezing Abd:  Mildly distended abdomen, not tense, no tenderness MSK:  No deformity noted.   Anasarca: Pitting edema to bilateral lower extremities, very subtle edema to his face bilaterally, eyelids without rash, erythema Neuro:  No focal deficits appreciated. Other:     ED Results / Procedures / Treatments   Labs (all labs ordered are listed, but only abnormal results are displayed) Labs Reviewed  COMPREHENSIVE METABOLIC PANEL WITH GFR - Abnormal; Notable for the following components:      Result Value   Sodium 147 (*)    Glucose, Bld 106 (*)    Calcium  8.7 (*)    ALT 180 (*)    All other components within normal limits  CBC WITH DIFFERENTIAL/PLATELET - Abnormal; Notable for the following components:   RBC 3.76 (*)    Hemoglobin 11.5 (*)    HCT 35.5 (*)    RDW 21.2 (*)    All other components within normal limits  PRO BRAIN NATRIURETIC PEPTIDE - Abnormal; Notable for the following components:  Pro Brain Natriuretic Peptide 11,526.0 (*)    All other components within normal limits  TROPONIN T, HIGH SENSITIVITY - Abnormal; Notable for the following components:   Troponin T High Sensitivity 32 (*)    All other components within normal limits  TROPONIN T, HIGH SENSITIVITY - Abnormal; Notable for the following components:   Troponin T High Sensitivity 34 (*)    All other components within normal limits  MAGNESIUM     EKG Sinus rhythm with a rate of 65 bpm, left bundle morphology, no STEMI by Sgarbossa criteria  RADIOLOGY CT head interpreted by me without evidence of acute intracranial pathology 2 view CXR interpreted by me with cardiomegaly and pulmonary vascular congestion  Official radiology report(s): DG Chest 2 View Result Date: 07/09/2024 CLINICAL DATA:  Shortness of breath cough and peripheral edema EXAM: CHEST - 2 VIEW COMPARISON:  04/03/2024 FINDINGS: Cardiomegaly with suspected small pleural effusions. Mild central vascular  congestion. Slightly enlarged appearing central pulmonary vessels. Aortic atherosclerosis. IMPRESSION: Cardiomegaly with mild central vascular congestion and small pleural effusions. Slightly enlarged appearing central pulmonary vessels, possible arterial hypertension. Electronically Signed   By: Luke Bun M.D.   On: 07/09/2024 16:04   CT Head Wo Contrast Result Date: 07/09/2024 EXAM: CT HEAD WITHOUT CONTRAST 07/09/2024 12:14:00 PM TECHNIQUE: CT of the head was performed without the administration of intravenous contrast. Automated exposure control, iterative reconstruction, and/or weight based adjustment of the mA/kV was utilized to reduce the radiation dose to as low as reasonably achievable. COMPARISON: CT head 08/30/2020. CLINICAL HISTORY: facial swelling left side FINDINGS: BRAIN AND VENTRICLES: No acute hemorrhage. 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 Stable moderate to severe chronic small vessel disease. No evidence of acute infarct. No hydrocephalus. No extra-axial collection. No mass effect or midline shift. ORBITS: No acute abnormality. SINUSES: No acute abnormality. SOFT TISSUES AND SKULL: No acute soft tissue abnormality. No skull fracture. IMPRESSION: 1. No acute intracranial abnormality. Electronically signed by: Ryan Chess MD 07/09/2024 12:25 PM EST RP Workstation: [id number]    PROCEDURES and INTERVENTIONS:  Procedures  Medications  albuterol  (VENTOLIN  HFA) 108 (90 Base) MCG/ACT inhaler 2 puff (has no administration in time range)  dextromethorphan-guaiFENesin  (MUCINEX  DM) 30-600 MG per 12 hr tablet 1 tablet (has no administration in time range)  ondansetron  (ZOFRAN ) injection 4 mg (has no administration in time range)  hydrALAZINE  (APRESOLINE ) injection 10 mg (has no administration in time range)  ibuprofen (ADVIL) tablet 200 mg (has no administration in time range)  insulin  aspart (novoLOG ) injection 0-9 Units (has no administration in time range)  insulin   aspart (novoLOG ) injection 0-5 Units (has no administration in time range)  furosemide  (LASIX ) injection 40 mg (has no administration in time range)  apixaban  (ELIQUIS ) tablet 5 mg (has no administration in time range)  apixaban  (ELIQUIS ) tablet 5 mg (5 mg Oral Given 07/09/24 1539)  atorvastatin (LIPITOR) tablet 20 mg (20 mg Oral Given 07/09/24 1539)  carvedilol  (COREG ) tablet 25 mg (25 mg Oral Given 07/09/24 1539)  dapagliflozin  propanediol (FARXIGA ) tablet 10 mg (10 mg Oral Given 07/09/24 1608)  sacubitril -valsartan  (ENTRESTO ) 49-51 mg per tablet (1 tablet Oral Given 07/09/24 1609)  spironolactone  (ALDACTONE ) tablet 25 mg (25 mg Oral Given 07/09/24 1539)  potassium chloride  SA (KLOR-CON  M) CR tablet 40 mEq (40 mEq Oral Given 07/09/24 1611)  furosemide  (LASIX ) injection 80 mg (80 mg Intravenous Given 07/09/24 1611)     IMPRESSION / MDM / ASSESSMENT AND PLAN / ED COURSE  I reviewed the triage vital signs  and the nursing notes.  Differential diagnosis includes, but is not limited to, angioedema, facial cellulitis, anasarca, CHF exacerbation, medication noncompliance  {Patient presents with symptoms of an acute illness or injury that is potentially life-threatening.  Well-appearing patient presents to the ED with signs of anasarca likely due to running out of his home medications.  He is asymptomatic at rest, did not realize he had facial swelling but multiple people told him so.  On exam he has anasarca without unilateral facial swelling, signs of cellulitis, intraoral swelling, airway concerns or angioedema.  Elevated blood pressure but asymptomatic at rest.  Will provide home medications, check basic labs, troponins and BNP, CXR.  Suspected benefit from diuresis, possibly medical admission.  He has a desire to go home to watch football this weekend.  Ultimately able to admit as he is willing to stay after talking to his sister.  Clinical Course as of 07/09/24 1917  Fri Jul 09, 2024   1758 Reassessed. Reports feeling great. Again reports he wants to go home. Good urinary output. Remains hypertensive [DS]  1802 I FaceTime with his sister Kevin [name].  She will talk with them to see if he is willing to stay [DS]  1845 Reassessed, his sister has commenced him to stay and he is willing to be admitted [DS]  1916 I consulted medicine who agrees to admit [DS]    Clinical Course User Index [DS] Claudene Rover, MD     FINAL CLINICAL IMPRESSION(S) / ED DIAGNOSES   Final diagnoses:  Anasarca  Uncontrolled hypertension     Rx / DC Orders   ED Discharge Orders          Ordered    apixaban  (ELIQUIS ) 5 MG TABS tablet  2 times daily        07/09/24 1804    atorvastatin (LIPITOR) 20 MG tablet  Daily        07/09/24 1804    carvedilol  (COREG ) 25 MG tablet  2 times daily        07/09/24 1804    dapagliflozin  propanediol (FARXIGA ) 10 MG TABS tablet  Daily before breakfast        07/09/24 1804    sacubitril -valsartan  (ENTRESTO ) 49-51 MG  2 times daily        07/09/24 1804    spironolactone  (ALDACTONE ) 25 MG tablet  Daily        07/09/24 1804    albuterol  (VENTOLIN  HFA) 108 (90 Base) MCG/ACT inhaler  Every 6 hours PRN        07/09/24 1804             Note:  This document was prepared using Dragon voice recognition software and may include unintentional dictation errors.   Claudene Rover, MD 07/09/24 [phone]

## 2024-07-09 NOTE — H&P (Signed)
 History and Physical    Kevin [name] FMW:[id number] DOB: July 08, 1956 DOA: 07/09/2024  Referring MD/NP/PA:   PCP: Pcp, No   Patient coming from:  The patient is coming from home.     Chief Complaint: Puffy eyes, leg swelling  HPI: Kevin [name] is a 68 y.o. male with medical history significant of sCHF with EF 20-25%, HTN, HLD, DM, codp, A-fib on Eliquis , anemia, former smoker, medication noncompliance, who presents with puffy eyes and leg swelling.  Patient states that he ran out of all of his medications for more than 1 week.  This morning he noticed facial swelling and puffy eyes, left eye is more swollen than the right.  He also has worsening bilateral leg edema.  He has dry cough, no chest pain, SOB.  No fever or chills.  No nausea vomiting, diarrhea or abdominal pain.  No symptoms UTI.  Data reviewed independently and ED Course: pt was found to have WBC 6.6, troponin 33 --> 34, proBNP 11526, GFR> 60.  Blood pressure 226/135, heart rate 67, RR 18, oxygen saturation 96% on room air, temperature 97.3.  CT head negative for acute intracranial abnormalities.  Chest x-ray showed cardiomegaly and mild vascular congestion with small pleural effusion.  Patient is admitted to PCU as inpatient.  EKG: I have personally reviewed.  Sinus rhythm, QTc 464, LAD, poor R wave progression, anteroseptal infarction pattern   Review of Systems:   General: no fevers, chills, no body weight gain, fatigue.  HEENT: no blurry vision, hearing changes or sore throat Respiratory: no dyspnea, has coughing, no wheezing CV: no chest pain, no palpitations GI: no nausea, vomiting, abdominal pain, diarrhea, constipation GU: no dysuria, burning on urination, increased urinary frequency, hematuria  Ext: has leg edema Neuro: no unilateral weakness, numbness, or tingling, no vision change or hearing loss Skin: no rash, no skin tear. MSK: No muscle spasm, no deformity, no limitation of range of movement in spin Heme: No  easy bruising.  Travel history: No recent long distant travel.   Allergy: No Known Allergies  Past Medical History:  Diagnosis Date   CHF (congestive heart failure) (HCC)    COPD (chronic obstructive pulmonary disease) (HCC)    Diabetes mellitus without complication (HCC)    Heart failure with mid-range ejection fraction (HCC)    a. 2019 reported neg stress test and nl cors on cath in [facility]; b. 07/2019 Echo: EF 25-30%, Gr2 DD, glob HK. Mod reduced RV fxn w/ volume overload. Mod dil RA. Mild to mod TR. Mod elev PASP; b. 12/2019 Echo: EF 50-55%; c. 05/2020 Echo: EF 45-50%, glob HK, sev LVH, GrII DD, nl RV size/fxn, mildly BAE, mild AoV sclerosis, mildly dilated Ao root.   Hypertension    NICM (nonischemic cardiomyopathy) (HCC)    Noncompliance    Pleural effusion on right    a. 07/2019 Thoracentesis: 300 ml.   Tobacco abuse     Past Surgical History:  Procedure Laterality Date   CARDIOVERSION N/A 07/17/2023   Procedure: CARDIOVERSION;  Surgeon: Gardenia Led, DO;  Location: [facility] ORS;  Service: Cardiovascular;  Laterality: N/A;   TEE WITHOUT CARDIOVERSION N/A 07/17/2023   Procedure: TRANSESOPHAGEAL ECHOCARDIOGRAM (TEE);  Surgeon: Gardenia Led, DO;  Location: [facility] ORS;  Service: Cardiovascular;  Laterality: N/A;    Social History:  reports that he has quit smoking. His smoking use included cigarettes. He started smoking about 5 years ago. He has a 7.9 pack-year smoking history. He has never used smokeless tobacco. He reports that he  does not currently use drugs. He reports that he does not drink alcohol.  Family History: History reviewed. No pertinent family history.  I have reviewed with patient about his family medical history, but patient states that he does not know any detailed information about his family medical history.    Prior to Admission medications   Medication Sig Start Date End Date Taking? Authorizing Provider  albuterol  (VENTOLIN  HFA) 108 (90 Base)  MCG/ACT inhaler Inhale 2 puffs into the lungs every 6 (six) hours as needed for wheezing or shortness of breath. 07/09/24  Yes Claudene Rover, MD  apixaban  (ELIQUIS ) 5 MG TABS tablet Take 1 tablet (5 mg total) by mouth 2 (two) times daily. 07/09/24  Yes Claudene Rover, MD  atorvastatin (LIPITOR) 20 MG tablet Take 1 tablet (20 mg total) by mouth daily. 07/09/24 07/09/25 Yes Claudene Rover, MD  carvedilol  (COREG ) 25 MG tablet Take 1 tablet (25 mg total) by mouth 2 (two) times daily. 07/09/24 07/09/25 Yes Claudene Rover, MD  dapagliflozin  propanediol (FARXIGA ) 10 MG TABS tablet Take 1 tablet (10 mg total) by mouth daily before breakfast. 07/09/24  Yes Claudene Rover, MD  sacubitril -valsartan  (ENTRESTO ) 49-51 MG Take 1 tablet by mouth 2 (two) times daily. 07/09/24  Yes Claudene Rover, MD  spironolactone  (ALDACTONE ) 25 MG tablet Take 1 tablet (25 mg total) by mouth daily. 07/09/24 07/09/25 Yes Claudene Rover, MD  apixaban  (ELIQUIS ) 5 MG TABS tablet Take 1 tablet (5 mg total) by mouth 2 (two) times daily. 06/29/24 12/26/24  Donette Ellouise LABOR, FNP  ascorbic acid  (VITAMIN C ) 500 MG tablet Take 1 tablet (500 mg total) by mouth daily. 04/10/24 07/09/24  Von Bellis, MD  atorvastatin (LIPITOR) 20 MG tablet Take 1 tablet (20 mg total) by mouth daily. 06/29/24   Donette Ellouise A, FNP  Blood Pressure KIT 1 kit by Does not apply route daily. 12/23/19   Iloabachie, Chioma E, NP  carvedilol  (COREG ) 25 MG tablet Take 1 tablet (25 mg total) by mouth 2 (two) times daily with a meal. 06/29/24   Donette Ellouise LABOR, FNP  dapagliflozin  propanediol (FARXIGA ) 10 MG TABS tablet Take 1 tablet (10 mg total) by mouth daily. 06/29/24 06/29/25  Donette Ellouise LABOR, FNP  furosemide  (LASIX ) 40 MG tablet Take 1 tablet (40 mg total) by mouth daily. 06/29/24 06/29/25  Donette Ellouise LABOR, FNP  iron  polysaccharides (NIFEREX) 150 MG capsule Take 1 capsule (150 mg total) by mouth daily. 04/10/24 07/09/24  Von Bellis, MD  sacubitril -valsartan  (ENTRESTO ) 49-51 MG Take 1 tablet  by mouth 2 (two) times daily. 06/29/24   Donette Ellouise LABOR, FNP  spironolactone  (ALDACTONE ) 25 MG tablet Take 0.5 tablets (12.5 mg total) by mouth daily. 06/29/24 09/27/24  Donette Ellouise LABOR, FNP  tamsulosin  (FLOMAX ) 0.4 MG CAPS capsule Take 1 capsule (0.4 mg total) by mouth daily after supper. 07/31/23   Jens Durand, MD  Vitamin D , Ergocalciferol , (DRISDOL ) 1.25 MG (50000 UNIT) CAPS capsule Take 1 capsule (50,000 Units total) by mouth every 7 (seven) days. 04/14/24 07/13/24  Von Bellis, MD    Physical Exam: Vitals:   07/09/24 1616 07/09/24 1746 07/09/24 1846 07/09/24 1926  BP: (!) 214/136 (!) 206/136  (!) 193/115  Pulse: 64 66  70  Resp: 18 16  (!) 26  Temp:   (!) 97.3 F (36.3 C) 97.6 F (36.4 C)  TempSrc:   Oral Oral  SpO2: 97% 96%  98%  Weight:      Height:       General: Not in  acute distress. has anasarca HEENT:       Eyes: PERRL, EOMI, no jaundice       ENT: No discharge from the ears and nose, no pharynx injection, no tonsillar enlargement.        Neck: No JVD, no bruit, no mass felt. Heme: No neck lymph node enlargement. Cardiac: S1/S2, RRR, No murmurs, No gallops or rubs. Respiratory: Has fine crackles bilaterally GI: Soft, nondistended, nontender, no rebound pain, no organomegaly, BS present. GU: No hematuria Ext: 2+ pitting leg edema bilaterally. 1+DP/PT pulse bilaterally. Musculoskeletal: No joint deformities, No joint redness or warmth, no limitation of ROM in spin. Skin: No rashes.  Neuro: Alert, oriented X3, cranial nerves II-XII grossly intact, moves all extremities normally.  Psych: Patient is not psychotic, no suicidal or hemocidal ideation.  Labs on Admission: I have personally reviewed following labs and imaging studies  CBC: Recent Labs  Lab 07/09/24 1522  WBC 6.6  NEUTROABS 2.9  HGB 11.5*  HCT 35.5*  MCV 94.4  PLT 169   Basic Metabolic Panel: Recent Labs  Lab 07/09/24 1522  NA 147*  K 3.7  CL 111  CO2 26  GLUCOSE 106*  BUN 22  CREATININE  0.97  CALCIUM  8.7*  MG 2.0   GFR: Estimated Creatinine Clearance: 74.6 mL/min (by C-G formula based on SCr of 0.97 mg/dL). Liver Function Tests: Recent Labs  Lab 07/09/24 1522  AST 39  ALT 180*  ALKPHOS 117  BILITOT 1.0  PROT 6.5  ALBUMIN 3.6   No results for input(s): LIPASE, AMYLASE in the last 168 hours. No results for input(s): AMMONIA in the last 168 hours. Coagulation Profile: No results for input(s): INR, PROTIME in the last 168 hours. Cardiac Enzymes: No results for input(s): CKTOTAL, CKMB, CKMBINDEX, TROPONINI in the last 168 hours. BNP (last 3 results) Recent Labs    08/04/23 1222 07/09/24 1522  PROBNP 630* 11,526.0*   HbA1C: No results for input(s): HGBA1C in the last 72 hours. CBG: No results for input(s): GLUCAP in the last 168 hours. Lipid Profile: No results for input(s): CHOL, HDL, LDLCALC, TRIG, CHOLHDL, LDLDIRECT in the last 72 hours. Thyroid  Function Tests: No results for input(s): TSH, T4TOTAL, FREET4, T3FREE, THYROIDAB in the last 72 hours. Anemia Panel: No results for input(s): VITAMINB12, FOLATE, FERRITIN, TIBC, IRON , RETICCTPCT in the last 72 hours. Urine analysis:    Component Value Date/Time   COLORURINE YELLOW (A) 04/09/2024 0958   APPEARANCEUR CLEAR (A) 04/09/2024 0958   APPEARANCEUR Clear 10/04/2020 1312   LABSPEC 1.011 04/09/2024 0958   PHURINE 7.0 04/09/2024 0958   GLUCOSEU NEGATIVE 04/09/2024 0958   HGBUR NEGATIVE 04/09/2024 0958   BILIRUBINUR NEGATIVE 04/09/2024 0958   BILIRUBINUR Negative 10/04/2020 1312   KETONESUR NEGATIVE 04/09/2024 0958   PROTEINUR NEGATIVE 04/09/2024 0958   NITRITE NEGATIVE 04/09/2024 0958   LEUKOCYTESUR NEGATIVE 04/09/2024 0958   Sepsis Labs: @LABRCNTIP (procalcitonin:4,lacticidven:4) )No results found for this or any previous visit (from the past 240 hours).   Radiological Exams on Admission:   Assessment/Plan Principal Problem:   Acute on  chronic systolic CHF (congestive heart failure) (HCC) Active Problems:   Hypertensive urgency   Myocardial injury   PAF (paroxysmal atrial fibrillation) (HCC)   COPD (chronic obstructive pulmonary disease) (HCC)   Dyslipidemia   Type 2 diabetes, controlled, with neuropathy (HCC)   Abnormal LFTs   Overweight (BMI 25.0-29.9)   Assessment and Plan:  Acute on chronic systolic CHF (congestive heart failure) (HCC): Patient has anasarca, significantly elevated pro BNP  [id number], vascular congestion by chest x-ray Entresto , clinically consistent with CHF exacerbation.  2D echo on 07/12/2023 showed EF of 20-25%.  -Will admit to PCU as inpatient -Lasix  40 mg bid by IV (pt received 80 mg of Lasix  in ED) -2d echo  -Entresto  -Daily weights -strict I/O's -Low salt diet -Fluid restriction -As needed bronchodilators for shortness of breath  Hypertensive urgency: Blood pressure 226/135.  This is due to medication noncompliance. - Resumed home medications: Coreg , Entresto , spironolactone , also on IV Lasix  - IV hydralazine  as needed - Give 0.1 mg of nitroglycerin  patch  Myocardial injury: Troponin 32 --> 34, no chest pain, likely demand ischemia. -Lipitor - Will not give aspirin  since patient is Eliquis   PAF (paroxysmal atrial fibrillation) (HCC): Heart rate 60s -Coreg , Eliquis   COPD (chronic obstructive pulmonary disease) (HCC): Stable -Bronchodilators as needed Mucinex   Dyslipidemia -Lipitor  Type 2 diabetes, controlled, with neuropathy (HCC): Recent A1c 6.0.  Patient is taking Farxiga  at home -SSI  Abnormal LFTs: AST 39, ALT 180, total bilirubin 1.0, ALP 117.  Likely due to liver congestion secondary to CHF exacerbation. - Avoid using Tylenol  - As needed ibuprofen  Overweight (BMI 25.0-29.9): Body weight 79.4 kg, BMI 27.41 - Encourage losing weight - Exercise healthy diet        DVT ppx: on Eliquis   Code Status: Full code   Family Communication:     not done, no family  member is at bed side.     Disposition Plan:  Anticipate discharge back to previous environment  Consults called:  none  Admission status and Level of care: Progressive: as inpt        Dispo: The patient is from: Home              Anticipated d/c is to: Home              Anticipated d/c date is: 2 days              Patient currently is not medically stable to d/c.    Severity of Illness:  The appropriate patient status for this patient is INPATIENT. Inpatient status is judged to be reasonable and necessary in order to provide the required intensity of service to ensure the patient's safety. The patient's presenting symptoms, physical exam findings, and initial radiographic and laboratory data in the context of their chronic comorbidities is felt to place them at high risk for further clinical deterioration. Furthermore, it is not anticipated that the patient will be medically stable for discharge from the hospital within 2 midnights of admission.   * I certify that at the point of admission it is my clinical judgment that the patient will require inpatient hospital care spanning beyond 2 midnights from the point of admission due to high intensity of service, high risk for further deterioration and high frequency of surveillance required.*       Date of Service 07/09/2024    Caleb Exon [facility]   If 7PM-7AM, please contact night-coverage [email or web address] 07/09/2024, 7:43 PM

## 2024-07-09 NOTE — ED Notes (Signed)
Pharmacy paged to verify meds.

## 2024-07-09 NOTE — ED Triage Notes (Addendum)
 Pt arrives POV with c/o facial swelling to the left side of the face and endorses twitching to the left eyelid that they noticed when they woke up this morning. Pt denies any pain, SOB, CP. Pt is A&Ox4 and ambulatory during triage. Pt uses a 4 wheel walker.   Pt takes BP medicine and states that they are taking it as prescribed.

## 2024-07-09 NOTE — ED Notes (Signed)
 Patient transported to X-ray

## 2024-07-10 ENCOUNTER — Inpatient Hospital Stay: Admit: 2024-07-10 | Discharge: 2024-07-10 | Disposition: A | Attending: Internal Medicine | Admitting: Internal Medicine

## 2024-07-10 DIAGNOSIS — I5021 Acute systolic (congestive) heart failure: Secondary | ICD-10-CM

## 2024-07-10 DIAGNOSIS — I5023 Acute on chronic systolic (congestive) heart failure: Secondary | ICD-10-CM

## 2024-07-10 LAB — ECHOCARDIOGRAM COMPLETE
AR max vel: 2.62 cm2
AV Peak grad: 3.1 mmHg
Ao pk vel: 0.89 m/s
Area-P 1/2: 3.79 cm2
Calc EF: 24.7 %
Height: 67 in
S' Lateral: 4.6 cm
Single Plane A2C EF: 24.5 %
Single Plane A4C EF: 28.4 %
Weight: 2800 [oz_av]

## 2024-07-10 LAB — BASIC METABOLIC PANEL WITH GFR
Anion gap: 7 (ref 5–15)
BUN: 22 mg/dL (ref 8–23)
CO2: 28 mmol/L (ref 22–32)
Calcium: 8.6 mg/dL — ABNORMAL LOW (ref 8.9–10.3)
Chloride: 110 mmol/L (ref 98–111)
Creatinine, Ser: 0.98 mg/dL (ref 0.61–1.24)
GFR, Estimated: >60 mL/min (ref >60)
Glucose, Bld: 103 mg/dL — ABNORMAL HIGH (ref 70–99)
Potassium: 3.6 mmol/L (ref 3.5–5.1)
Sodium: 145 mmol/L (ref 135–145)

## 2024-07-10 LAB — CBC
HCT: 33.8 % — ABNORMAL LOW (ref 39.0–52.0)
Hemoglobin: 11.1 g/dL — ABNORMAL LOW (ref 13.0–17.0)
MCH: 30.7 pg (ref 26.0–34.0)
MCHC: 32.8 g/dL (ref 30.0–36.0)
MCV: 93.6 fL (ref 80.0–100.0)
Platelets: 165 K/uL (ref 150–400)
RBC: 3.61 MIL/uL — ABNORMAL LOW (ref 4.22–5.81)
RDW: 21.5 % — ABNORMAL HIGH (ref 11.5–15.5)
WBC: 7 K/uL (ref 4.0–10.5)
nRBC: 0 % (ref 0.0–0.2)

## 2024-07-10 LAB — MAGNESIUM: Magnesium: 1.9 mg/dL (ref 1.7–2.4)

## 2024-07-10 LAB — CBG MONITORING, ED
Glucose-Capillary: 117 mg/dL — ABNORMAL HIGH (ref 70–99)
Glucose-Capillary: 81 mg/dL (ref 70–99)
Glucose-Capillary: 106 mg/dL — ABNORMAL HIGH (ref 70–99)
Glucose-Capillary: 110 mg/dL — ABNORMAL HIGH (ref 70–99)

## 2024-07-10 MED ORDER — PERFLUTREN LIPID MICROSPHERE
1.0000 mL | INTRAVENOUS | Status: AC | PRN
  Administered 2024-07-10: 3 mL via INTRAVENOUS

## 2024-07-10 MED ORDER — DAPAGLIFLOZIN PROPANEDIOL 10 MG PO TABS
10.0000 mg | ORAL_TABLET | Freq: Every day | ORAL | Status: DC
  Administered 2024-07-10 – 2024-07-13 (×4): 10 mg via ORAL
  Filled 2024-07-10 (×4): qty 1

## 2024-07-10 MED ORDER — SPIRONOLACTONE 25 MG PO TABS
25.0000 mg | ORAL_TABLET | Freq: Every day | ORAL | Status: DC
  Administered 2024-07-10 – 2024-07-13 (×4): 25 mg via ORAL
  Filled 2024-07-10 (×3): qty 1

## 2024-07-10 NOTE — ED Notes (Addendum)
 Patient ambulated to the restroom with walker at this time. Patient also requested a brief and some washcloths to wash up while in the restroom. This RN provided patient with self care items and a brief at this time.

## 2024-07-10 NOTE — ED Notes (Signed)
 Pt was off heart monitor. This NT went to go check on pt. Pt was using the urinal. This NT informed pt that NT would come back. This NT went back to check on pt, pt had jacket and sneakers on. Pt stated that he was going to a cafeteria to get his nephew some breakfast. This NT informed pt that he was a pt in the hospital and could not go down to the cafeteria. RN informed.

## 2024-07-10 NOTE — Progress Notes (Signed)
 Echocardiogram 2D Echocardiogram has been performed with echo imagining contrast.  Ashley Bultema N Jhovany Weidinger,RDCS 07/10/2024, 11:22 AM

## 2024-07-10 NOTE — ED Notes (Signed)
 Pt's urinals were emptied.

## 2024-07-10 NOTE — Progress Notes (Signed)
 PROGRESS NOTE    Kevin [name]   FMW:[id number] DOB: [birth year]  DOA: 07/09/2024 Date of Service: 07/11/24 which is hospital day 2  PCP: Pcp, No    Hospital course / significant events:   Kevin [name] is a 68 y.o. male with medical history significant of sCHF with EF 20-25%, HTN, HLD, DM, codp, A-fib on Eliquis , anemia, former smoker, medication noncompliance, who presents with puffy eyes and leg swelling. Out of all meds for a week.   11/14: to ED. Admitted to hospitalist for CHF. Lasix  IV 80 mg IV in ED. UOP approx 2400 mL.  11/15: continue lasix  40 mg IV bid. BP still high. Echo stable from 1 year ago. D/w cardiology - given stable echo and known cause for exacerbation, and given improvement, will resume meds and he can follow outpatient. Continuing diuresis through today and reassess tomorrow.       Consultants:  none  Procedures/Surgeries: none      ASSESSMENT & PLAN:   Acute on chronic systolic CHF (congestive heart failure) Echo on 07/12/2023 showed EF of 20-25%. Echo 07/10/2024 today - EF 20-25% Lasix  40 mg bid by IV  Entresto  49-51 mg bid  Coreg  35 mg bid  Spironolactone  12.5 mg daily --> increased to 25 mg w/ high BP Dapaglifozin 10 mg daily Daily weight, strict I/O's, Low salt diet, Fluid restriction Curbside w/ cardiology - given stable echo and known cause for exacerbation, likely is ok for outpatient follow up   Medication access barrier - cost / coverage cited by patient as reason he was out of meds  Pharmacy to review meds for coverage: at minimum will need diuresis, BB, ACE/ARB, spiro No problem expected w/ furosemide , carvedilol , spironolactone .  Can substitute ACE/ARB for Entresto  if Entresto  isn't covered.  Can withhold Farxiga  if this isn't covered  Ideally would stay on Eliquis  rather than switch to warfarin   Hypertensive urgency due to medication noncompliance. Resumed home medications: Coreg , Entresto , increased spironolactone , now on IV  Lasix  IV hydralazine  as needed 0.1 mg of nitroglycerin  patch   PAF (paroxysmal atrial fibrillation)  Coreg , Eliquis   Myocardial injury Demand ischemia, no ACS Telemetry Repeat troponin and 12-lead EKG if chest pain    COPD (chronic obstructive pulmonary disease) Not in exacerbation Bronchodilators Mucinex    Dyslipidemia Lipitor   Type 2 diabetes, controlled, with neuropathy Recent A1c 6.0.  Patient is taking Farxiga  at home Farxiga  SSI   Abnormal LFTs Likely due to liver congestion secondary to CHF exacerbation. Avoid using Tylenol  As needed ibuprofen  BPH Tamsulosin    Overweight based on BMI: Body mass index is 27.41 kg/m.SABRA Significantly low or high BMI is associated with higher medical risk.  Underweight - under 18  overweight - 25 to 29 obese - 30 or more Class 1 obesity: BMI of 30.0 to 34 Class 2 obesity: BMI of 35.0 to 39 Class 3 obesity: BMI of 40.0 to 49 Super Morbid Obesity: BMI 50-59 Super-super Morbid Obesity: BMI 60+ Healthy nutrition and physical activity advised as adjunct to other disease management and risk reduction treatments    DVT prophylaxis: on eliwuis  IV fluids: no continuous IV fluids  Nutrition: cardiac/carb Central lines / other devices: none  Code Status: FULL CODE ACP documentation reviewed:  none on file in [facility]  TOC needs: TBD Medical barriers to dispo: diuresing. Expected medical readiness for discharge few days.              Subjective / Brief ROS:  Patient reports feeling better today,  still some LE edema Denies CP/SOB.  Pain controlled.  Denies new weakness.  Tolerating diet.  Reports no concerns w/ urination/defecation.   Family Communication: none at this time     Objective Findings:  Vitals:   07/11/24 0613 07/11/24 0619 07/11/24 0735 07/11/24 0740  BP: (!) 143/118 (!) 143/118 (!) 157/96   Pulse:  72 73 73  Resp:  16 20 (!) 21  Temp:  98.2 F (36.8 C)    TempSrc:  Oral    SpO2:  100% 96%  96%  Weight:      Height:        Intake/Output Summary (Last 24 hours) at 07/11/2024 0748 Last data filed at 07/11/2024 0741 Gross per 24 hour  Intake --  Output 3000 ml  Net -3000 ml   Filed Weights   07/09/24 1136  Weight: 79.4 kg    Examination:  Physical Exam Constitutional:      General: He is not in acute distress. Cardiovascular:     Rate and Rhythm: Normal rate and regular rhythm.  Pulmonary:     Breath sounds: Rales (very faint) present.  Musculoskeletal:     Right lower leg: Edema present.     Left lower leg: Edema present.  Neurological:     Mental Status: He is alert and oriented to person, place, and time. Mental status is at baseline.  Psychiatric:        Mood and Affect: Mood normal.        Behavior: Behavior normal.          Scheduled Medications:   apixaban   5 mg Oral BID   ascorbic acid   500 mg Oral Daily   atorvastatin  20 mg Oral Daily   carvedilol   25 mg Oral BID WC   dapagliflozin  propanediol  10 mg Oral Daily   furosemide   40 mg Intravenous Q12H   iron  polysaccharides  150 mg Oral Daily   sacubitril -valsartan   1 tablet Oral BID   spironolactone   25 mg Oral Daily   tamsulosin   0.4 mg Oral QPC supper    Continuous Infusions:   PRN Medications:  albuterol , dextromethorphan-guaiFENesin , hydrALAZINE , ibuprofen, ondansetron  (ZOFRAN ) IV  Antimicrobials from admission:  Anti-infectives (From admission, onward)    None           Data Reviewed:  I have personally reviewed the following...  CBC: Recent Labs  Lab 07/09/24 1522 07/10/24 0343  WBC 6.6 7.0  NEUTROABS 2.9  --   HGB 11.5* 11.1*  HCT 35.5* 33.8*  MCV 94.4 93.6  PLT 169 165   Basic Metabolic Panel: Recent Labs  Lab 07/09/24 1522 07/10/24 0343 07/11/24 0430  NA 147* 145 143  K 3.7 3.6 3.5  CL 111 110 105  CO2 26 28 29   GLUCOSE 106* 103* 106*  BUN 22 22 18   CREATININE 0.97 0.98 0.98  CALCIUM  8.7* 8.6* 8.5*  MG 2.0 1.9  --    GFR: Estimated  Creatinine Clearance: 73.9 mL/min (by C-G formula based on SCr of 0.98 mg/dL). Liver Function Tests: Recent Labs  Lab 07/09/24 1522  AST 39  ALT 180*  ALKPHOS 117  BILITOT 1.0  PROT 6.5  ALBUMIN 3.6   No results for input(s): LIPASE, AMYLASE in the last 168 hours. No results for input(s): AMMONIA in the last 168 hours. Coagulation Profile: No results for input(s): INR, PROTIME in the last 168 hours. Cardiac Enzymes: No results for input(s): CKTOTAL, CKMB, CKMBINDEX, TROPONINI in the last 168 hours. BNP (  last 3 results) Recent Labs    08/04/23 1222 07/09/24 1522  PROBNP 630* 11,526.0*   HbA1C: No results for input(s): HGBA1C in the last 72 hours. CBG: Recent Labs  Lab 07/10/24 1145 07/10/24 1523 07/10/24 2113 07/11/24 0540 07/11/24 0716  GLUCAP 81 106* 110* 133* 97   Lipid Profile: No results for input(s): CHOL, HDL, LDLCALC, TRIG, CHOLHDL, LDLDIRECT in the last 72 hours. Thyroid  Function Tests: No results for input(s): TSH, T4TOTAL, FREET4, T3FREE, THYROIDAB in the last 72 hours. Anemia Panel: No results for input(s): VITAMINB12, FOLATE, FERRITIN, TIBC, IRON , RETICCTPCT in the last 72 hours. Most Recent Urinalysis On File:     Component Value Date/Time   COLORURINE YELLOW (A) 04/09/2024 0958   APPEARANCEUR CLEAR (A) 04/09/2024 0958   APPEARANCEUR Clear 10/04/2020 1312   LABSPEC 1.011 04/09/2024 0958   PHURINE 7.0 04/09/2024 0958   GLUCOSEU NEGATIVE 04/09/2024 0958   HGBUR NEGATIVE 04/09/2024 0958   BILIRUBINUR NEGATIVE 04/09/2024 0958   BILIRUBINUR Negative 10/04/2020 1312   KETONESUR NEGATIVE 04/09/2024 0958   PROTEINUR NEGATIVE 04/09/2024 0958   NITRITE NEGATIVE 04/09/2024 0958   LEUKOCYTESUR NEGATIVE 04/09/2024 0958   Sepsis Labs: @LABRCNTIP (procalcitonin:4,lacticidven:4) Microbiology: No results found for this or any previous visit (from the past 240 hours).    Radiology Studies last 3  days: ECHOCARDIOGRAM COMPLETE Result Date: 07/10/2024    ECHOCARDIOGRAM REPORT   Patient Name:   STEIN WINDHORST Date of Exam: 07/10/2024 Medical Rec #:  [id number]   Height:       67.0 in Accession #:    [id number]  Weight:       175.0 lb Date of Birth:  [birth year]    BSA:          1.911 m Patient Age:    68 years    BP:           226/136 mmHg Patient Gender: M           HR:           66 bpm. Exam Location:  Inpatient Procedure: 2D Echo, 3D Echo, Cardiac Doppler, Color Doppler, Strain Analysis and            Intracardiac Opacification Agent (Both Spectral and Color Flow            Doppler were utilized during procedure). Indications:     CHF-Acute Systolic  History:         Patient has prior history of Echocardiogram examinations, most                  recent 07/17/2023. CHF and Cardiomyopathy, COPD; Risk                  Factors:Hypertension and Former Smoker.  Sonographer:     Logan Shove RDCS Referring Phys:  [id number] XILIN NIU Diagnosing Phys: Annabella Scarce MD IMPRESSIONS  1. Left ventricular ejection fraction, by estimation, is 20 to 25%. Left ventricular ejection fraction by 3D volume is 22 %. The left ventricle has severely decreased function. The left ventricle demonstrates global hypokinesis. There is moderate asymmetric left ventricular hypertrophy of the septal segment. Left ventricular diastolic parameters are consistent with Grade II diastolic dysfunction (pseudonormalization). Elevated left ventricular end-diastolic pressure. The average left ventricular global longitudinal strain is -6.4 %. The global longitudinal strain is abnormal.  2. Right ventricular systolic function is moderately reduced. The right ventricular size is normal. There is severely elevated pulmonary artery systolic pressure.  3. Left atrial size was severely dilated.  4. Right atrial size was severely dilated.  5. The mitral valve is normal in structure. No evidence of mitral valve regurgitation. No evidence of mitral stenosis.  6.  Functional tricuspid regurgitation due to annular dilitation. Tricuspid valve regurgitation is moderate.  7. The aortic valve is tricuspid. Aortic valve regurgitation is trivial. No aortic stenosis is present.  8. The inferior vena cava is dilated in size with <50% respiratory variability, suggesting right atrial pressure of 15 mmHg. FINDINGS  Left Ventricle: Left ventricular ejection fraction, by estimation, is 20 to 25%. Left ventricular ejection fraction by 3D volume is 22 %. The left ventricle has severely decreased function. The left ventricle demonstrates global hypokinesis. Definity contrast agent was given IV to delineate the left ventricular endocardial borders. The average left ventricular global longitudinal strain is -6.4 %. Strain was performed and the global longitudinal strain is abnormal. The left ventricular internal cavity size was normal in size. There is moderate asymmetric left ventricular hypertrophy of the septal segment. Left ventricular diastolic parameters are consistent with Grade II diastolic dysfunction (pseudonormalization). Elevated left ventricular end-diastolic pressure. Right Ventricle: The right ventricular size is normal. No increase in right ventricular wall thickness. Right ventricular systolic function is moderately reduced. There is severely elevated pulmonary artery systolic pressure. The tricuspid regurgitant velocity is 3.83 m/s, and with an assumed right atrial pressure of 15 mmHg, the estimated right ventricular systolic pressure is 73.7 mmHg. Left Atrium: Left atrial size was severely dilated. Right Atrium: Right atrial size was severely dilated. Pericardium: There is no evidence of pericardial effusion. Mitral Valve: The mitral valve is normal in structure. No evidence of mitral valve regurgitation. No evidence of mitral valve stenosis. Tricuspid Valve: Functional tricuspid regurgitation due to annular dilitation. The tricuspid valve is normal in structure. Tricuspid  valve regurgitation is moderate . No evidence of tricuspid stenosis. Aortic Valve: The aortic valve is tricuspid. Aortic valve regurgitation is trivial. No aortic stenosis is present. Aortic valve peak gradient measures 3.1 mmHg. Pulmonic Valve: The pulmonic valve was normal in structure. Pulmonic valve regurgitation is mild. No evidence of pulmonic stenosis. Aorta: The aortic root is normal in size and structure. Venous: The inferior vena cava is dilated in size with less than 50% respiratory variability, suggesting right atrial pressure of 15 mmHg. IAS/Shunts: No atrial level shunt detected by color flow Doppler. Additional Comments: 3D was performed not requiring image post processing on an independent workstation and was abnormal.  LEFT VENTRICLE PLAX 2D LVIDd:         5.10 cm         Diastology LVIDs:         4.60 cm         LV e' lateral:   4.30 cm/s LV PW:         1.20 cm         LV E/e' lateral: 21.3 LV IVS:        1.60 cm LVOT diam:     2.10 cm         2D Longitudinal LVOT Area:     3.46 cm        Strain                                2D Strain GLS   -6.5 %                                (  A4C): LV Volumes (MOD)               2D Strain GLS   -6.2 % LV vol d, MOD    163.0 ml      (A3C): A2C:                           2D Strain GLS   -6.5 % LV vol d, MOD    141.0 ml      (A2C): A4C:                           2D Strain GLS   -6.4 % LV vol s, MOD    123.0 ml      Avg: A2C: LV vol s, MOD    101.0 ml      3D Volume EF A4C:                           LV 3D EF:    Left LV SV MOD A2C:   40.0 ml                    ventricul LV SV MOD A4C:   141.0 ml                   ar LV SV MOD BP:    37.6 ml                    ejection                                             fraction                                             by 3D                                             volume is                                             22 %.                                 3D Volume EF:                                3D EF:        22  %                                LV EDV:       196 ml  LV ESV:       153 ml                                LV SV:        43 ml RIGHT VENTRICLE            IVC RV Basal diam:  4.70 cm    IVC diam: 2.60 cm RV Mid diam:    2.30 cm RV S prime:     9.41 cm/s TAPSE (M-mode): 1.9 cm LEFT ATRIUM             Index        RIGHT ATRIUM           Index LA diam:        4.70 cm 2.46 cm/m   RA Area:     33.00 cm LA Vol (A2C):   93.5 ml 48.94 ml/m  RA Volume:   128.00 ml 66.99 ml/m LA Vol (A4C):   78.8 ml 41.24 ml/m LA Biplane Vol: 94.2 ml 49.30 ml/m  AORTIC VALVE AV Area (Vmax): 2.62 cm AV Vmax:        88.50 cm/s AV Peak Grad:   3.1 mmHg LVOT Vmax:      66.90 cm/s  AORTA Ao Root diam: 2.90 cm Ao Asc diam:  3.40 cm MITRAL VALVE               TRICUSPID VALVE MV Area (PHT): 3.79 cm    TV Peak grad:   57.8 mmHg MV Decel Time: 200 msec    TV Vmax:        3.80 m/s MV E velocity: 91.50 cm/s  TR Peak grad:   58.7 mmHg                            TR Mean grad:   37.0 mmHg                            TR Vmax:        383.00 cm/s                            TR Vmean:       282.0 cm/s                             SHUNTS                            Systemic Diam: 2.10 cm Annabella Scarce MD Electronically signed by Annabella Scarce MD Signature Date/Time: 07/10/2024/2:11:26 PM    Final    DG Chest 2 View Result Date: 07/09/2024 CLINICAL DATA:  Shortness of breath cough and peripheral edema EXAM: CHEST - 2 VIEW COMPARISON:  04/03/2024 FINDINGS: Cardiomegaly with suspected small pleural effusions. Mild central vascular congestion. Slightly enlarged appearing central pulmonary vessels. Aortic atherosclerosis. IMPRESSION: Cardiomegaly with mild central vascular congestion and small pleural effusions. Slightly enlarged appearing central pulmonary vessels, possible arterial hypertension. Electronically Signed   By: Luke Bun M.D.   On: 07/09/2024 16:04   CT Head Wo Contrast Result Date: 07/09/2024 EXAM: CT  HEAD WITHOUT CONTRAST 07/09/2024 12:14:00 PM TECHNIQUE: CT of the head was performed without the administration  of intravenous contrast. Automated exposure control, iterative reconstruction, and/or weight based adjustment of the mA/kV was utilized to reduce the radiation dose to as low as reasonably achievable. COMPARISON: CT head 08/30/2020. CLINICAL HISTORY: facial swelling left side FINDINGS: BRAIN AND VENTRICLES: No acute hemorrhage. 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 Stable moderate to severe chronic small vessel disease. No evidence of acute infarct. No hydrocephalus. No extra-axial collection. No mass effect or midline shift. ORBITS: No acute abnormality. SINUSES: No acute abnormality. SOFT TISSUES AND SKULL: No acute soft tissue abnormality. No skull fracture. IMPRESSION: 1. No acute intracranial abnormality. Electronically signed by: Ryan Chess MD 07/09/2024 12:25 PM EST RP Workstation: [id number]          Laneta Blunt, DO [facility] 07/11/2024, 7:48 AM    Dictation software may have been used to generate the above note. Typos may occur and escape review in typed/dictated notes. Please contact Dr Blunt directly for clarity if needed.  Staff may message me via secure chat in Epic  but this may not receive an immediate response,  please page me for urgent matters!  If 7PM-7AM, please contact night coverage [email or web address]

## 2024-07-10 NOTE — Hospital Course (Addendum)
 Hospital course / significant events:   Kevin [name] is a 68 y.o. male with medical history significant of sCHF with EF 20-25%, HTN, HLD, DM, codp, A-fib on Eliquis , anemia, former smoker, medication noncompliance, who presents with puffy eyes and leg swelling. Out of all meds for a week.   11/14: to ED. Admitted to hospitalist for CHF. Lasix  IV 80 mg IV in ED. UOP approx 2400 mL.  11/15: continue lasix  40 mg IV bid. BP still high. Echo stable from 1 year ago. D/w cardiology - given stable echo and known cause for exacerbation, and given improvement, will resume meds and he can follow outpatient. Continuing diuresis through today and reassess tomorrow.  11/16: per pharmacy, pt never applied for medicare D so expect some Rx will be costly until this can be taken care of. Still diuresing, 5L off total as of mid-day today  11/17: 3L off yesterday. Renal fxn WNL. Continue diuresis       Consultants:  none  Procedures/Surgeries: none      ASSESSMENT & PLAN:   Acute on chronic systolic CHF (congestive heart failure)  Echo on 07/12/2023 showed EF of 20-25%. Echo 07/10/2024 today - EF 20-25% Lasix  40 mg bid by IV until volume neutral / Cr up, at which point torsemide  20 mg daily  Entresto  49-51 mg bid --> to 97-103 mg  Coreg  25 mg bid  Spironolactone  12.5 mg daily --> to 25 mg w/ high BP Dapaglifozin 10 mg daily Daily weight, strict I/O's, Low salt diet, Fluid restriction Curbside w/ cardiology - given stable echo and known cause for exacerbation, plan for outpatient follow up I do not feel need to consult them at this time but low threshold if pt worsening / not improving  Telemetry can continue given low EF  Medication access barrier - cost / coverage cited by patient as reason he was out of meds  Pt never applied for Medicare D - provided instructions on this by pharmacist  Copays for Entresto , Farxiga , and Jardiance are $4  No problem expected w/ torsemide , carvedilol ,  spironolactone .  Ideally would stay on Eliquis  rather than switch to warfarin  - will d/w pharmacy   Hypertensive urgency due to medication noncompliance. Resumed home medications: Coreg , Entresto , increased spironolactone , now on IV Lasix  IV hydralazine  as needed 0.1 mg of nitroglycerin  patch has been dc    PAF (paroxysmal atrial fibrillation)  Coreg , Eliquis   Myocardial injury Demand ischemia, no ACS Telemetry can continue given low EF Repeat troponin and 12-lead EKG if chest pain    COPD (chronic obstructive pulmonary disease) Not in exacerbation Bronchodilators Mucinex    Dyslipidemia Lipitor   Type 2 diabetes, controlled, with neuropathy Recent A1c 6.0.  Patient is taking Farxiga  at home Farxiga  SSI   Abnormal LFTs Likely due to liver congestion secondary to CHF exacerbation. Avoid using Tylenol  As needed ibuprofen  BPH Tamsulosin    Overweight based on BMI: Body mass index is 27.41 kg/m.SABRA Significantly low or high BMI is associated with higher medical risk.  Underweight - under 18  overweight - 25 to 29 obese - 30 or more Class 1 obesity: BMI of 30.0 to 34 Class 2 obesity: BMI of 35.0 to 39 Class 3 obesity: BMI of 40.0 to 49 Super Morbid Obesity: BMI 50-59 Super-super Morbid Obesity: BMI 60+ Healthy nutrition and physical activity advised as adjunct to other disease management and risk reduction treatments    DVT prophylaxis: on eliwuis  IV fluids: no continuous IV fluids  Nutrition: cardiac/carb Central  lines / other devices: none  Code Status: FULL CODE ACP documentation reviewed:  none on file in [facility]  TOC needs: TBD Medical barriers to dispo: diuresing. Expected medical readiness for discharge few days.

## 2024-07-11 ENCOUNTER — Other Ambulatory Visit: Payer: Self-pay

## 2024-07-11 DIAGNOSIS — I5023 Acute on chronic systolic (congestive) heart failure: Secondary | ICD-10-CM | POA: Diagnosis not present

## 2024-07-11 LAB — BASIC METABOLIC PANEL WITH GFR
Anion gap: 10 (ref 5–15)
BUN: 18 mg/dL (ref 8–23)
CO2: 29 mmol/L (ref 22–32)
Calcium: 8.5 mg/dL — ABNORMAL LOW (ref 8.9–10.3)
Chloride: 105 mmol/L (ref 98–111)
Creatinine, Ser: 0.98 mg/dL (ref 0.61–1.24)
GFR, Estimated: >60 mL/min (ref >60)
Glucose, Bld: 106 mg/dL — ABNORMAL HIGH (ref 70–99)
Potassium: 3.5 mmol/L (ref 3.5–5.1)
Sodium: 143 mmol/L (ref 135–145)

## 2024-07-11 LAB — CBG MONITORING, ED
Glucose-Capillary: 133 mg/dL — ABNORMAL HIGH (ref 70–99)
Glucose-Capillary: 97 mg/dL (ref 70–99)

## 2024-07-11 NOTE — ED Notes (Signed)
 Informed RN bed assigned

## 2024-07-11 NOTE — Progress Notes (Signed)
 PROGRESS NOTE    Kevin [name]   FMW:[id number] DOB: 08-Nov-1955  DOA: 07/09/2024 Date of Service: 07/11/24 which is hospital day 2  PCP: Pcp, No   Hospital course / significant events:   Kevin [name] is a 68 y.o. male with medical history significant of sCHF with EF 20-25%, HTN, HLD, DM, codp, A-fib on Eliquis , anemia, former smoker, medication noncompliance, who presents with puffy eyes and leg swelling. Out of all meds for a week.   11/14: to ED. Admitted to hospitalist for CHF. Lasix  IV 80 mg IV in ED. UOP approx 2400 mL.  11/15: continue lasix  40 mg IV bid. BP still high. Echo stable from 1 year ago. D/w cardiology - given stable echo and known cause for exacerbation, and given improvement, will resume meds and he can follow outpatient. Continuing diuresis through today and reassess tomorrow.  11/16: per pharmacy, pt never applied for medicare D so expect some Rx will be costly until this can be taken care of. Still diuresing through today 1.7L off, 5L off total as of mid-day today      Consultants:  none  Procedures/Surgeries: none      ASSESSMENT & PLAN:   Acute on chronic systolic CHF (congestive heart failure) Echo on 07/12/2023 showed EF of 20-25%. Echo 07/10/2024 today - EF 20-25% Lasix  40 mg bid by IV until volume neutral / Cr up Entresto  49-51 mg bid  Coreg  35 mg bid  Spironolactone  12.5 mg daily --> increased to 25 mg w/ high BP Dapaglifozin 10 mg daily Daily weight, strict I/O's, Low salt diet, Fluid restriction Curbside w/ cardiology - given stable echo and known cause for exacerbation, likely is ok for outpatient follow up   Medication access barrier - cost / coverage cited by patient as reason he was out of meds  Pt never applied for Medicare D No problem expected w/ furosemide , carvedilol , spironolactone .  Can substitute ACE/ARB for Entresto  if Entresto  isn't covered - will d/w pharmacy tomorrow  Can withhold Farxiga  if this isn't covered  - will d/w  pharmacy tomorrow  Ideally would stay on Eliquis  rather than switch to warfarin  - will d/w pharmacy tomorrow   Hypertensive urgency due to medication noncompliance. Resumed home medications: Coreg , Entresto , increased spironolactone , now on IV Lasix  IV hydralazine  as needed 0.1 mg of nitroglycerin  patch   PAF (paroxysmal atrial fibrillation)  Coreg , Eliquis   Myocardial injury Demand ischemia, no ACS Telemetry Repeat troponin and 12-lead EKG if chest pain    COPD (chronic obstructive pulmonary disease) Not in exacerbation Bronchodilators Mucinex    Dyslipidemia Lipitor   Type 2 diabetes, controlled, with neuropathy Recent A1c 6.0.  Patient is taking Farxiga  at home Farxiga  SSI   Abnormal LFTs Likely due to liver congestion secondary to CHF exacerbation. Avoid using Tylenol  As needed ibuprofen  BPH Tamsulosin    Overweight based on BMI: Body mass index is 27.41 kg/m.SABRA Significantly low or high BMI is associated with higher medical risk.  Underweight - under 18  overweight - 25 to 29 obese - 30 or more Class 1 obesity: BMI of 30.0 to 34 Class 2 obesity: BMI of 35.0 to 39 Class 3 obesity: BMI of 40.0 to 49 Super Morbid Obesity: BMI 50-59 Super-super Morbid Obesity: BMI 60+ Healthy nutrition and physical activity advised as adjunct to other disease management and risk reduction treatments    DVT prophylaxis: on eliwuis  IV fluids: no continuous IV fluids  Nutrition: cardiac/carb Central lines / other devices: none  Code Status: FULL  CODE ACP documentation reviewed:  none on file in [facility]  TOC needs: TBD Medical barriers to dispo: diuresing. Expected medical readiness for discharge few days.                 Subjective / Brief ROS:  Patient reports feeling better today, still some LE edema Denies CP/SOB.  Pain controlled.  Denies new weakness.  Tolerating diet.  Reports no concerns w/ urination/defecation.   Family Communication: none at  this time     Objective Findings:  Vitals:   07/11/24 0740 07/11/24 1014 07/11/24 1050 07/11/24 1304  BP:   (!) 155/83 (!) 141/86  Pulse: 73  64 70  Resp: (!) 21  18 17   Temp:  98.3 F (36.8 C)  98 F (36.7 C)  TempSrc:  Oral  Oral  SpO2: 96%  100% 100%  Weight:      Height:        Intake/Output Summary (Last 24 hours) at 07/11/2024 1646 Last data filed at 07/11/2024 0741 Gross per 24 hour  Intake --  Output 1700 ml  Net -1700 ml   Filed Weights   07/09/24 1136  Weight: 79.4 kg    Examination:  Physical Exam Constitutional:      General: He is not in acute distress. Cardiovascular:     Rate and Rhythm: Normal rate and regular rhythm.  Pulmonary:     Breath sounds: Rales (very faint) present.  Musculoskeletal:     Right lower leg: Edema present.     Left lower leg: Edema present.  Neurological:     Mental Status: He is alert and oriented to person, place, and time. Mental status is at baseline.  Psychiatric:        Mood and Affect: Mood normal.        Behavior: Behavior normal.          Scheduled Medications:   apixaban   5 mg Oral BID   ascorbic acid   500 mg Oral Daily   atorvastatin  20 mg Oral Daily   carvedilol   25 mg Oral BID WC   dapagliflozin  propanediol  10 mg Oral Daily   furosemide   40 mg Intravenous Q12H   iron  polysaccharides  150 mg Oral Daily   sacubitril -valsartan   1 tablet Oral BID   spironolactone   25 mg Oral Daily   tamsulosin   0.4 mg Oral QPC supper    Continuous Infusions:   PRN Medications:  albuterol , dextromethorphan-guaiFENesin , hydrALAZINE , ibuprofen, ondansetron  (ZOFRAN ) IV  Antimicrobials from admission:  Anti-infectives (From admission, onward)    None           Data Reviewed:  I have personally reviewed the following...  CBC: Recent Labs  Lab 07/09/24 1522 07/10/24 0343  WBC 6.6 7.0  NEUTROABS 2.9  --   HGB 11.5* 11.1*  HCT 35.5* 33.8*  MCV 94.4 93.6  PLT 169 165   Basic Metabolic  Panel: Recent Labs  Lab 07/09/24 1522 07/10/24 0343 07/11/24 0430  NA 147* 145 143  K 3.7 3.6 3.5  CL 111 110 105  CO2 26 28 29   GLUCOSE 106* 103* 106*  BUN 22 22 18   CREATININE 0.97 0.98 0.98  CALCIUM  8.7* 8.6* 8.5*  MG 2.0 1.9  --    GFR: Estimated Creatinine Clearance: 73.9 mL/min (by C-G formula based on SCr of 0.98 mg/dL). Liver Function Tests: Recent Labs  Lab 07/09/24 1522  AST 39  ALT 180*  ALKPHOS 117  BILITOT 1.0  PROT 6.5  ALBUMIN 3.6   No results for input(s): LIPASE, AMYLASE in the last 168 hours. No results for input(s): AMMONIA in the last 168 hours. Coagulation Profile: No results for input(s): INR, PROTIME in the last 168 hours. Cardiac Enzymes: No results for input(s): CKTOTAL, CKMB, CKMBINDEX, TROPONINI in the last 168 hours. BNP (last 3 results) Recent Labs    08/04/23 1222 07/09/24 1522  PROBNP 630* 11,526.0*   HbA1C: No results for input(s): HGBA1C in the last 72 hours. CBG: Recent Labs  Lab 07/10/24 1145 07/10/24 1523 07/10/24 2113 07/11/24 0540 07/11/24 0716  GLUCAP 81 106* 110* 133* 97   Lipid Profile: No results for input(s): CHOL, HDL, LDLCALC, TRIG, CHOLHDL, LDLDIRECT in the last 72 hours. Thyroid  Function Tests: No results for input(s): TSH, T4TOTAL, FREET4, T3FREE, THYROIDAB in the last 72 hours. Anemia Panel: No results for input(s): VITAMINB12, FOLATE, FERRITIN, TIBC, IRON , RETICCTPCT in the last 72 hours. Most Recent Urinalysis On File:     Component Value Date/Time   COLORURINE YELLOW (A) 04/09/2024 0958   APPEARANCEUR CLEAR (A) 04/09/2024 0958   APPEARANCEUR Clear 10/04/2020 1312   LABSPEC 1.011 04/09/2024 0958   PHURINE 7.0 04/09/2024 0958   GLUCOSEU NEGATIVE 04/09/2024 0958   HGBUR NEGATIVE 04/09/2024 0958   BILIRUBINUR NEGATIVE 04/09/2024 0958   BILIRUBINUR Negative 10/04/2020 1312   KETONESUR NEGATIVE 04/09/2024 0958   PROTEINUR NEGATIVE 04/09/2024 0958    NITRITE NEGATIVE 04/09/2024 0958   LEUKOCYTESUR NEGATIVE 04/09/2024 0958   Sepsis Labs: @LABRCNTIP (procalcitonin:4,lacticidven:4) Microbiology: No results found for this or any previous visit (from the past 240 hours).    Radiology Studies last 3 days: ECHOCARDIOGRAM COMPLETE Result Date: 07/10/2024    ECHOCARDIOGRAM REPORT   Patient Name:   Kevin [name] Date of Exam: 07/10/2024 Medical Rec #:  [id number]   Height:       67.0 in Accession #:    [id number]  Weight:       175.0 lb Date of Birth:  [birth year]    BSA:          1.911 m Patient Age:    68 years    BP:           226/136 mmHg Patient Gender: M           HR:           66 bpm. Exam Location:  Inpatient Procedure: 2D Echo, 3D Echo, Cardiac Doppler, Color Doppler, Strain Analysis and            Intracardiac Opacification Agent (Both Spectral and Color Flow            Doppler were utilized during procedure). Indications:     CHF-Acute Systolic  History:         Patient has prior history of Echocardiogram examinations, most                  recent 07/17/2023. CHF and Cardiomyopathy, COPD; Risk                  Factors:Hypertension and Former Smoker.  Sonographer:     Logan Shove RDCS Referring Phys:  [id number] XILIN NIU Diagnosing Phys: Annabella Scarce MD IMPRESSIONS  1. Left ventricular ejection fraction, by estimation, is 20 to 25%. Left ventricular ejection fraction by 3D volume is 22 %. The left ventricle has severely decreased function. The left ventricle demonstrates global hypokinesis. There is moderate asymmetric left ventricular hypertrophy of the septal segment. Left ventricular diastolic parameters are consistent with Grade II diastolic  dysfunction (pseudonormalization). Elevated left ventricular end-diastolic pressure. The average left ventricular global longitudinal strain is -6.4 %. The global longitudinal strain is abnormal.  2. Right ventricular systolic function is moderately reduced. The right ventricular size is normal. There is severely  elevated pulmonary artery systolic pressure.  3. Left atrial size was severely dilated.  4. Right atrial size was severely dilated.  5. The mitral valve is normal in structure. No evidence of mitral valve regurgitation. No evidence of mitral stenosis.  6. Functional tricuspid regurgitation due to annular dilitation. Tricuspid valve regurgitation is moderate.  7. The aortic valve is tricuspid. Aortic valve regurgitation is trivial. No aortic stenosis is present.  8. The inferior vena cava is dilated in size with <50% respiratory variability, suggesting right atrial pressure of 15 mmHg. FINDINGS  Left Ventricle: Left ventricular ejection fraction, by estimation, is 20 to 25%. Left ventricular ejection fraction by 3D volume is 22 %. The left ventricle has severely decreased function. The left ventricle demonstrates global hypokinesis. Definity contrast agent was given IV to delineate the left ventricular endocardial borders. The average left ventricular global longitudinal strain is -6.4 %. Strain was performed and the global longitudinal strain is abnormal. The left ventricular internal cavity size was normal in size. There is moderate asymmetric left ventricular hypertrophy of the septal segment. Left ventricular diastolic parameters are consistent with Grade II diastolic dysfunction (pseudonormalization). Elevated left ventricular end-diastolic pressure. Right Ventricle: The right ventricular size is normal. No increase in right ventricular wall thickness. Right ventricular systolic function is moderately reduced. There is severely elevated pulmonary artery systolic pressure. The tricuspid regurgitant velocity is 3.83 m/s, and with an assumed right atrial pressure of 15 mmHg, the estimated right ventricular systolic pressure is 73.7 mmHg. Left Atrium: Left atrial size was severely dilated. Right Atrium: Right atrial size was severely dilated. Pericardium: There is no evidence of pericardial effusion. Mitral Valve:  The mitral valve is normal in structure. No evidence of mitral valve regurgitation. No evidence of mitral valve stenosis. Tricuspid Valve: Functional tricuspid regurgitation due to annular dilitation. The tricuspid valve is normal in structure. Tricuspid valve regurgitation is moderate . No evidence of tricuspid stenosis. Aortic Valve: The aortic valve is tricuspid. Aortic valve regurgitation is trivial. No aortic stenosis is present. Aortic valve peak gradient measures 3.1 mmHg. Pulmonic Valve: The pulmonic valve was normal in structure. Pulmonic valve regurgitation is mild. No evidence of pulmonic stenosis. Aorta: The aortic root is normal in size and structure. Venous: The inferior vena cava is dilated in size with less than 50% respiratory variability, suggesting right atrial pressure of 15 mmHg. IAS/Shunts: No atrial level shunt detected by color flow Doppler. Additional Comments: 3D was performed not requiring image post processing on an independent workstation and was abnormal.  LEFT VENTRICLE PLAX 2D LVIDd:         5.10 cm         Diastology LVIDs:         4.60 cm         LV e' lateral:   4.30 cm/s LV PW:         1.20 cm         LV E/e' lateral: 21.3 LV IVS:        1.60 cm LVOT diam:     2.10 cm         2D Longitudinal LVOT Area:     3.46 cm        Strain  2D Strain GLS   -6.5 %                                (A4C): LV Volumes (MOD)               2D Strain GLS   -6.2 % LV vol d, MOD    163.0 ml      (A3C): A2C:                           2D Strain GLS   -6.5 % LV vol d, MOD    141.0 ml      (A2C): A4C:                           2D Strain GLS   -6.4 % LV vol s, MOD    123.0 ml      Avg: A2C: LV vol s, MOD    101.0 ml      3D Volume EF A4C:                           LV 3D EF:    Left LV SV MOD A2C:   40.0 ml                    ventricul LV SV MOD A4C:   141.0 ml                   ar LV SV MOD BP:    37.6 ml                    ejection                                              fraction                                             by 3D                                             volume is                                             22 %.                                 3D Volume EF:                                3D EF:        22 %  LV EDV:       196 ml                                LV ESV:       153 ml                                LV SV:        43 ml RIGHT VENTRICLE            IVC RV Basal diam:  4.70 cm    IVC diam: 2.60 cm RV Mid diam:    2.30 cm RV S prime:     9.41 cm/s TAPSE (M-mode): 1.9 cm LEFT ATRIUM             Index        RIGHT ATRIUM           Index LA diam:        4.70 cm 2.46 cm/m   RA Area:     33.00 cm LA Vol (A2C):   93.5 ml 48.94 ml/m  RA Volume:   128.00 ml 66.99 ml/m LA Vol (A4C):   78.8 ml 41.24 ml/m LA Biplane Vol: 94.2 ml 49.30 ml/m  AORTIC VALVE AV Area (Vmax): 2.62 cm AV Vmax:        88.50 cm/s AV Peak Grad:   3.1 mmHg LVOT Vmax:      66.90 cm/s  AORTA Ao Root diam: 2.90 cm Ao Asc diam:  3.40 cm MITRAL VALVE               TRICUSPID VALVE MV Area (PHT): 3.79 cm    TV Peak grad:   57.8 mmHg MV Decel Time: 200 msec    TV Vmax:        3.80 m/s MV E velocity: 91.50 cm/s  TR Peak grad:   58.7 mmHg                            TR Mean grad:   37.0 mmHg                            TR Vmax:        383.00 cm/s                            TR Vmean:       282.0 cm/s                             SHUNTS                            Systemic Diam: 2.10 cm Annabella Scarce MD Electronically signed by Annabella Scarce MD Signature Date/Time: 07/10/2024/2:11:26 PM    Final    DG Chest 2 View Result Date: 07/09/2024 CLINICAL DATA:  Shortness of breath cough and peripheral edema EXAM: CHEST - 2 VIEW COMPARISON:  04/03/2024 FINDINGS: Cardiomegaly with suspected small pleural effusions. Mild central vascular congestion. Slightly enlarged appearing central pulmonary vessels. Aortic atherosclerosis. IMPRESSION: Cardiomegaly with mild central vascular  congestion and small pleural effusions. Slightly enlarged appearing central pulmonary vessels, possible arterial hypertension.  Electronically Signed   By: Luke Bun M.D.   On: 07/09/2024 16:04   CT Head Wo Contrast Result Date: 07/09/2024 EXAM: CT HEAD WITHOUT CONTRAST 07/09/2024 12:14:00 PM TECHNIQUE: CT of the head was performed without the administration of intravenous contrast. Automated exposure control, iterative reconstruction, and/or weight based adjustment of the mA/kV was utilized to reduce the radiation dose to as low as reasonably achievable. COMPARISON: CT head 08/30/2020. CLINICAL HISTORY: facial swelling left side FINDINGS: BRAIN AND VENTRICLES: No acute hemorrhage. 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 Stable moderate to severe chronic small vessel disease. No evidence of acute infarct. No hydrocephalus. No extra-axial collection. No mass effect or midline shift. ORBITS: No acute abnormality. SINUSES: No acute abnormality. SOFT TISSUES AND SKULL: No acute soft tissue abnormality. No skull fracture. IMPRESSION: 1. No acute intracranial abnormality. Electronically signed by: Ryan Chess MD 07/09/2024 12:25 PM EST RP Workstation: [id number]          Laneta Blunt, DO [facility] 07/11/2024, 4:46 PM    Dictation software may have been used to generate the above note. Typos may occur and escape review in typed/dictated notes. Please contact Dr Blunt directly for clarity if needed.  Staff may message me via secure chat in Epic  but this may not receive an immediate response,  please page me for urgent matters!  If 7PM-7AM, please contact night coverage [email or web address]

## 2024-07-11 NOTE — ED Notes (Signed)
 Pt given ginger ale.

## 2024-07-12 DIAGNOSIS — I5023 Acute on chronic systolic (congestive) heart failure: Secondary | ICD-10-CM | POA: Diagnosis not present

## 2024-07-12 LAB — CBC
HCT: 36.8 % — ABNORMAL LOW (ref 39.0–52.0)
Hemoglobin: 12.2 g/dL — ABNORMAL LOW (ref 13.0–17.0)
MCH: 30.9 pg (ref 26.0–34.0)
MCHC: 33.2 g/dL (ref 30.0–36.0)
MCV: 93.2 fL (ref 80.0–100.0)
Platelets: 202 K/uL (ref 150–400)
RBC: 3.95 MIL/uL — ABNORMAL LOW (ref 4.22–5.81)
RDW: 21.3 % — ABNORMAL HIGH (ref 11.5–15.5)
WBC: 7.7 K/uL (ref 4.0–10.5)
nRBC: 0 % (ref 0.0–0.2)

## 2024-07-12 LAB — COMPREHENSIVE METABOLIC PANEL WITH GFR
ALT: 84 U/L — ABNORMAL HIGH (ref 0–44)
AST: 23 U/L (ref 15–41)
Albumin: 3.3 g/dL — ABNORMAL LOW (ref 3.5–5.0)
Alkaline Phosphatase: 102 U/L (ref 38–126)
Anion gap: 7 (ref 5–15)
BUN: 19 mg/dL (ref 8–23)
CO2: 32 mmol/L (ref 22–32)
Calcium: 8.4 mg/dL — ABNORMAL LOW (ref 8.9–10.3)
Chloride: 101 mmol/L (ref 98–111)
Creatinine, Ser: 1.09 mg/dL (ref 0.61–1.24)
GFR, Estimated: >60 mL/min (ref >60)
Glucose, Bld: 103 mg/dL — ABNORMAL HIGH (ref 70–99)
Potassium: 3.8 mmol/L (ref 3.5–5.1)
Sodium: 139 mmol/L (ref 135–145)
Total Bilirubin: 1 mg/dL (ref 0.0–1.2)
Total Protein: 6 g/dL — ABNORMAL LOW (ref 6.5–8.1)

## 2024-07-12 LAB — LIPASE, BLOOD: Lipase: 23 U/L (ref 11–51)

## 2024-07-12 LAB — BASIC METABOLIC PANEL WITH GFR
Anion gap: 9 (ref 5–15)
BUN: 19 mg/dL (ref 8–23)
CO2: 30 mmol/L (ref 22–32)
Calcium: 8.4 mg/dL — ABNORMAL LOW (ref 8.9–10.3)
Chloride: 102 mmol/L (ref 98–111)
Creatinine, Ser: 1.05 mg/dL (ref 0.61–1.24)
GFR, Estimated: >60 mL/min (ref >60)
Glucose, Bld: 108 mg/dL — ABNORMAL HIGH (ref 70–99)
Potassium: 3.6 mmol/L (ref 3.5–5.1)
Sodium: 141 mmol/L (ref 135–145)

## 2024-07-12 MED ORDER — SACUBITRIL-VALSARTAN 97-103 MG PO TABS
1.0000 | ORAL_TABLET | Freq: Two times a day (BID) | ORAL | Status: DC
  Administered 2024-07-12 – 2024-07-13 (×2): 1 via ORAL
  Filled 2024-07-12 (×2): qty 1

## 2024-07-12 NOTE — Progress Notes (Signed)
 PROGRESS NOTE    Kevin [name]   FMW:[id number] DOB: 09/24/1955  DOA: 07/09/2024 Date of Service: 07/12/24 which is hospital day 3  PCP: Pcp, No   Hospital course / significant events:   Kevin [name] is a 68 y.o. male with medical history significant of sCHF with EF 20-25%, HTN, HLD, DM, codp, A-fib on Eliquis , anemia, former smoker, medication noncompliance, who presents with puffy eyes and leg swelling. Out of all meds for a week.   11/14: to ED. Admitted to hospitalist for CHF. Lasix  IV 80 mg IV in ED. UOP approx 2400 mL.  11/15: continue lasix  40 mg IV bid. BP still high. Echo stable from 1 year ago. D/w cardiology - given stable echo and known cause for exacerbation, and given improvement, will resume meds and he can follow outpatient. Continuing diuresis through today and reassess tomorrow.  11/16: per pharmacy, pt never applied for medicare D so expect some Rx will be costly until this can be taken care of. Still diuresing, 5L off total as of mid-day today  11/17: 3L off yesterday. Renal fxn WNL. Continue diuresis       Consultants:  none  Procedures/Surgeries: none      ASSESSMENT & PLAN:   Acute on chronic systolic CHF (congestive heart failure)  Echo on 07/12/2023 showed EF of 20-25%. Echo 07/10/2024 today - EF 20-25% Lasix  40 mg bid by IV until volume neutral / Cr up, at which point torsemide  20 mg daily  Entresto  49-51 mg bid --> to 97-103 mg  Coreg  25 mg bid  Spironolactone  12.5 mg daily --> to 25 mg w/ high BP Dapaglifozin 10 mg daily Daily weight, strict I/O's, Low salt diet, Fluid restriction Curbside w/ cardiology - given stable echo and known cause for exacerbation, plan for outpatient follow up I do not feel need to consult them at this time but low threshold if pt worsening / not improving  Telemetry can continue given low EF  Medication access barrier - cost / coverage cited by patient as reason he was out of meds  Pt never applied for Medicare D -  provided instructions on this by pharmacist  Copays for Entresto , Farxiga , and Jardiance are $4  No problem expected w/ torsemide , carvedilol , spironolactone .  Ideally would stay on Eliquis  rather than switch to warfarin  - will d/w pharmacy   Hypertensive urgency due to medication noncompliance. Resumed home medications: Coreg , Entresto , increased spironolactone , now on IV Lasix  IV hydralazine  as needed 0.1 mg of nitroglycerin  patch has been dc    PAF (paroxysmal atrial fibrillation)  Coreg , Eliquis   Myocardial injury Demand ischemia, no ACS Telemetry can continue given low EF Repeat troponin and 12-lead EKG if chest pain    COPD (chronic obstructive pulmonary disease) Not in exacerbation Bronchodilators Mucinex    Dyslipidemia Lipitor   Type 2 diabetes, controlled, with neuropathy Recent A1c 6.0.  Patient is taking Farxiga  at home Farxiga  SSI   Abnormal LFTs Likely due to liver congestion secondary to CHF exacerbation. Avoid using Tylenol  As needed ibuprofen  BPH Tamsulosin    Overweight based on BMI: Body mass index is 27.41 kg/m.SABRA Significantly low or high BMI is associated with higher medical risk.  Underweight - under 18  overweight - 25 to 29 obese - 30 or more Class 1 obesity: BMI of 30.0 to 34 Class 2 obesity: BMI of 35.0 to 39 Class 3 obesity: BMI of 40.0 to 49 Super Morbid Obesity: BMI 50-59 Super-super Morbid Obesity: BMI 60+ Healthy nutrition and physical  activity advised as adjunct to other disease management and risk reduction treatments    DVT prophylaxis: on eliwuis  IV fluids: no continuous IV fluids  Nutrition: cardiac/carb Central lines / other devices: none  Code Status: FULL CODE ACP documentation reviewed:  none on file in [facility]  TOC needs: TBD Medical barriers to dispo: diuresing. Expected medical readiness for discharge few days.                 Subjective / Brief ROS:  Patient reports feeling good today, still  some LE edema but this is improvign  Denies CP/SOB.  Pain controlled.  Denies new weakness.  Tolerating diet.  Reports no concerns w/ urination/defecation.   Family Communication: none at this time     Objective Findings:  Vitals:   07/11/24 2003 07/12/24 0410 07/12/24 0500 07/12/24 0808  BP: 131/78 114/70  (!) 155/76  Pulse: 66 67  65  Resp: 19 19  18   Temp: 97.9 F (36.6 C) 98.8 F (37.1 C)  98 F (36.7 C)  TempSrc:  Oral  Oral  SpO2: 99% 97%  97%  Weight:   84.4 kg   Height:        Intake/Output Summary (Last 24 hours) at 07/12/2024 1347 Last data filed at 07/12/2024 1212 Gross per 24 hour  Intake 480 ml  Output 4030 ml  Net -3550 ml   Filed Weights   07/09/24 1136 07/12/24 0500  Weight: 79.4 kg 84.4 kg    Examination:  Physical Exam Constitutional:      General: He is not in acute distress. Cardiovascular:     Rate and Rhythm: Normal rate and regular rhythm.  Pulmonary:     Breath sounds: No rales (very faint).  Musculoskeletal:     Right lower leg: No edema.     Left lower leg: Edema present.  Neurological:     Mental Status: He is alert and oriented to person, place, and time. Mental status is at baseline.  Psychiatric:        Mood and Affect: Mood normal.        Behavior: Behavior normal.          Scheduled Medications:   apixaban   5 mg Oral BID   ascorbic acid   500 mg Oral Daily   atorvastatin  20 mg Oral Daily   carvedilol   25 mg Oral BID WC   dapagliflozin  propanediol  10 mg Oral Daily   furosemide   40 mg Intravenous Q12H   iron  polysaccharides  150 mg Oral Daily   sacubitril -valsartan   1 tablet Oral BID   spironolactone   25 mg Oral Daily   tamsulosin   0.4 mg Oral QPC supper    Continuous Infusions:   PRN Medications:  albuterol , dextromethorphan-guaiFENesin , hydrALAZINE , ibuprofen, ondansetron  (ZOFRAN ) IV  Antimicrobials from admission:  Anti-infectives (From admission, onward)    None           Data Reviewed:   I have personally reviewed the following...  CBC: Recent Labs  Lab 07/09/24 1522 07/10/24 0343  WBC 6.6 7.0  NEUTROABS 2.9  --   HGB 11.5* 11.1*  HCT 35.5* 33.8*  MCV 94.4 93.6  PLT 169 165   Basic Metabolic Panel: Recent Labs  Lab 07/09/24 1522 07/10/24 0343 07/11/24 0430 07/12/24 0453  NA 147* 145 143 141  K 3.7 3.6 3.5 3.6  CL 111 110 105 102  CO2 26 28 29 30   GLUCOSE 106* 103* 106* 108*  BUN 22 22 18  19  CREATININE 0.97 0.98 0.98 1.05  CALCIUM  8.7* 8.6* 8.5* 8.4*  MG 2.0 1.9  --   --    GFR: Estimated Creatinine Clearance: 70.9 mL/min (by C-G formula based on SCr of 1.05 mg/dL). Liver Function Tests: Recent Labs  Lab 07/09/24 1522  AST 39  ALT 180*  ALKPHOS 117  BILITOT 1.0  PROT 6.5  ALBUMIN 3.6   No results for input(s): LIPASE, AMYLASE in the last 168 hours. No results for input(s): AMMONIA in the last 168 hours. Coagulation Profile: No results for input(s): INR, PROTIME in the last 168 hours. Cardiac Enzymes: No results for input(s): CKTOTAL, CKMB, CKMBINDEX, TROPONINI in the last 168 hours. BNP (last 3 results) Recent Labs    08/04/23 1222 07/09/24 1522  PROBNP 630* 11,526.0*   HbA1C: No results for input(s): HGBA1C in the last 72 hours. CBG: Recent Labs  Lab 07/10/24 1145 07/10/24 1523 07/10/24 2113 07/11/24 0540 07/11/24 0716  GLUCAP 81 106* 110* 133* 97   Lipid Profile: No results for input(s): CHOL, HDL, LDLCALC, TRIG, CHOLHDL, LDLDIRECT in the last 72 hours. Thyroid  Function Tests: No results for input(s): TSH, T4TOTAL, FREET4, T3FREE, THYROIDAB in the last 72 hours. Anemia Panel: No results for input(s): VITAMINB12, FOLATE, FERRITIN, TIBC, IRON , RETICCTPCT in the last 72 hours. Most Recent Urinalysis On File:     Component Value Date/Time   COLORURINE YELLOW (A) 04/09/2024 0958   APPEARANCEUR CLEAR (A) 04/09/2024 0958   APPEARANCEUR Clear 10/04/2020 1312   LABSPEC  1.011 04/09/2024 0958   PHURINE 7.0 04/09/2024 0958   GLUCOSEU NEGATIVE 04/09/2024 0958   HGBUR NEGATIVE 04/09/2024 0958   BILIRUBINUR NEGATIVE 04/09/2024 0958   BILIRUBINUR Negative 10/04/2020 1312   KETONESUR NEGATIVE 04/09/2024 0958   PROTEINUR NEGATIVE 04/09/2024 0958   NITRITE NEGATIVE 04/09/2024 0958   LEUKOCYTESUR NEGATIVE 04/09/2024 0958   Sepsis Labs: @LABRCNTIP (procalcitonin:4,lacticidven:4) Microbiology: No results found for this or any previous visit (from the past 240 hours).    Radiology Studies last 3 days: ECHOCARDIOGRAM COMPLETE Result Date: 07/10/2024    ECHOCARDIOGRAM REPORT   Patient Name:   Kevin [name] Date of Exam: 07/10/2024 Medical Rec #:  [id number]   Height:       67.0 in Accession #:    [id number]  Weight:       175.0 lb Date of Birth:  03-12-1956    BSA:          1.911 m Patient Age:    68 years    BP:           226/136 mmHg Patient Gender: M           HR:           66 bpm. Exam Location:  Inpatient Procedure: 2D Echo, 3D Echo, Cardiac Doppler, Color Doppler, Strain Analysis and            Intracardiac Opacification Agent (Both Spectral and Color Flow            Doppler were utilized during procedure). Indications:     CHF-Acute Systolic  History:         Patient has prior history of Echocardiogram examinations, most                  recent 07/17/2023. CHF and Cardiomyopathy, COPD; Risk                  Factors:Hypertension and Former Smoker.  Sonographer:     Logan Shove RDCS Referring Phys:  [phone] XILIN  NIU Diagnosing Phys: Annabella Scarce MD IMPRESSIONS  1. Left ventricular ejection fraction, by estimation, is 20 to 25%. Left ventricular ejection fraction by 3D volume is 22 %. The left ventricle has severely decreased function. The left ventricle demonstrates global hypokinesis. There is moderate asymmetric left ventricular hypertrophy of the septal segment. Left ventricular diastolic parameters are consistent with Grade II diastolic dysfunction (pseudonormalization).  Elevated left ventricular end-diastolic pressure. The average left ventricular global longitudinal strain is -6.4 %. The global longitudinal strain is abnormal.  2. Right ventricular systolic function is moderately reduced. The right ventricular size is normal. There is severely elevated pulmonary artery systolic pressure.  3. Left atrial size was severely dilated.  4. Right atrial size was severely dilated.  5. The mitral valve is normal in structure. No evidence of mitral valve regurgitation. No evidence of mitral stenosis.  6. Functional tricuspid regurgitation due to annular dilitation. Tricuspid valve regurgitation is moderate.  7. The aortic valve is tricuspid. Aortic valve regurgitation is trivial. No aortic stenosis is present.  8. The inferior vena cava is dilated in size with <50% respiratory variability, suggesting right atrial pressure of 15 mmHg. FINDINGS  Left Ventricle: Left ventricular ejection fraction, by estimation, is 20 to 25%. Left ventricular ejection fraction by 3D volume is 22 %. The left ventricle has severely decreased function. The left ventricle demonstrates global hypokinesis. Definity contrast agent was given IV to delineate the left ventricular endocardial borders. The average left ventricular global longitudinal strain is -6.4 %. Strain was performed and the global longitudinal strain is abnormal. The left ventricular internal cavity size was normal in size. There is moderate asymmetric left ventricular hypertrophy of the septal segment. Left ventricular diastolic parameters are consistent with Grade II diastolic dysfunction (pseudonormalization). Elevated left ventricular end-diastolic pressure. Right Ventricle: The right ventricular size is normal. No increase in right ventricular wall thickness. Right ventricular systolic function is moderately reduced. There is severely elevated pulmonary artery systolic pressure. The tricuspid regurgitant velocity is 3.83 m/s, and with an  assumed right atrial pressure of 15 mmHg, the estimated right ventricular systolic pressure is 73.7 mmHg. Left Atrium: Left atrial size was severely dilated. Right Atrium: Right atrial size was severely dilated. Pericardium: There is no evidence of pericardial effusion. Mitral Valve: The mitral valve is normal in structure. No evidence of mitral valve regurgitation. No evidence of mitral valve stenosis. Tricuspid Valve: Functional tricuspid regurgitation due to annular dilitation. The tricuspid valve is normal in structure. Tricuspid valve regurgitation is moderate . No evidence of tricuspid stenosis. Aortic Valve: The aortic valve is tricuspid. Aortic valve regurgitation is trivial. No aortic stenosis is present. Aortic valve peak gradient measures 3.1 mmHg. Pulmonic Valve: The pulmonic valve was normal in structure. Pulmonic valve regurgitation is mild. No evidence of pulmonic stenosis. Aorta: The aortic root is normal in size and structure. Venous: The inferior vena cava is dilated in size with less than 50% respiratory variability, suggesting right atrial pressure of 15 mmHg. IAS/Shunts: No atrial level shunt detected by color flow Doppler. Additional Comments: 3D was performed not requiring image post processing on an independent workstation and was abnormal.  LEFT VENTRICLE PLAX 2D LVIDd:         5.10 cm         Diastology LVIDs:         4.60 cm         LV e' lateral:   4.30 cm/s LV PW:         1.20 cm  LV E/e' lateral: 21.3 LV IVS:        1.60 cm LVOT diam:     2.10 cm         2D Longitudinal LVOT Area:     3.46 cm        Strain                                2D Strain GLS   -6.5 %                                (A4C): LV Volumes (MOD)               2D Strain GLS   -6.2 % LV vol d, MOD    163.0 ml      (A3C): A2C:                           2D Strain GLS   -6.5 % LV vol d, MOD    141.0 ml      (A2C): A4C:                           2D Strain GLS   -6.4 % LV vol s, MOD    123.0 ml      Avg: A2C: LV vol  s, MOD    101.0 ml      3D Volume EF A4C:                           LV 3D EF:    Left LV SV MOD A2C:   40.0 ml                    ventricul LV SV MOD A4C:   141.0 ml                   ar LV SV MOD BP:    37.6 ml                    ejection                                             fraction                                             by 3D                                             volume is                                             22 %.  3D Volume EF:                                3D EF:        22 %                                LV EDV:       196 ml                                LV ESV:       153 ml                                LV SV:        43 ml RIGHT VENTRICLE            IVC RV Basal diam:  4.70 cm    IVC diam: 2.60 cm RV Mid diam:    2.30 cm RV S prime:     9.41 cm/s TAPSE (M-mode): 1.9 cm LEFT ATRIUM             Index        RIGHT ATRIUM           Index LA diam:        4.70 cm 2.46 cm/m   RA Area:     33.00 cm LA Vol (A2C):   93.5 ml 48.94 ml/m  RA Volume:   128.00 ml 66.99 ml/m LA Vol (A4C):   78.8 ml 41.24 ml/m LA Biplane Vol: 94.2 ml 49.30 ml/m  AORTIC VALVE AV Area (Vmax): 2.62 cm AV Vmax:        88.50 cm/s AV Peak Grad:   3.1 mmHg LVOT Vmax:      66.90 cm/s  AORTA Ao Root diam: 2.90 cm Ao Asc diam:  3.40 cm MITRAL VALVE               TRICUSPID VALVE MV Area (PHT): 3.79 cm    TV Peak grad:   57.8 mmHg MV Decel Time: 200 msec    TV Vmax:        3.80 m/s MV E velocity: 91.50 cm/s  TR Peak grad:   58.7 mmHg                            TR Mean grad:   37.0 mmHg                            TR Vmax:        383.00 cm/s                            TR Vmean:       282.0 cm/s                             SHUNTS                            Systemic Diam: 2.10 cm Annabella Scarce MD Electronically signed by Annabella Scarce MD  Signature Date/Time: 07/10/2024/2:11:26 PM    Final    DG Chest 2 View Result Date: 07/09/2024 CLINICAL DATA:  Shortness of breath cough and  peripheral edema EXAM: CHEST - 2 VIEW COMPARISON:  04/03/2024 FINDINGS: Cardiomegaly with suspected small pleural effusions. Mild central vascular congestion. Slightly enlarged appearing central pulmonary vessels. Aortic atherosclerosis. IMPRESSION: Cardiomegaly with mild central vascular congestion and small pleural effusions. Slightly enlarged appearing central pulmonary vessels, possible arterial hypertension. Electronically Signed   By: Luke Bun M.D.   On: 07/09/2024 16:04   CT Head Wo Contrast Result Date: 07/09/2024 EXAM: CT HEAD WITHOUT CONTRAST 07/09/2024 12:14:00 PM TECHNIQUE: CT of the head was performed without the administration of intravenous contrast. Automated exposure control, iterative reconstruction, and/or weight based adjustment of the mA/kV was utilized to reduce the radiation dose to as low as reasonably achievable. COMPARISON: CT head 08/30/2020. CLINICAL HISTORY: facial swelling left side FINDINGS: BRAIN AND VENTRICLES: No acute hemorrhage. 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 Stable moderate to severe chronic small vessel disease. No evidence of acute infarct. No hydrocephalus. No extra-axial collection. No mass effect or midline shift. ORBITS: No acute abnormality. SINUSES: No acute abnormality. SOFT TISSUES AND SKULL: No acute soft tissue abnormality. No skull fracture. IMPRESSION: 1. No acute intracranial abnormality. Electronically signed by: Ryan Chess MD 07/09/2024 12:25 PM EST RP Workstation: [id number]          Laneta Blunt, DO [facility] 07/12/2024, 1:47 PM    Dictation software may have been used to generate the above note. Typos may occur and escape review in typed/dictated notes. Please contact Dr Blunt directly for clarity if needed.  Staff may message me via secure chat in Epic  but this may not receive an immediate response,  please page me for urgent matters!  If 7PM-7AM, please contact night  coverage [email or web address]

## 2024-07-12 NOTE — Progress Notes (Signed)
 Heart Failure Stewardship Pharmacy Note  PCP: Pcp, No PCP-Cardiologist: Redell Cave, MD  HPI: Kevin [name] is a 68 y.o. male with chronic combined systolic diastolic CHF, COPD, tobacco use, type 2 diabetes mellitus and hypertension who presented with lower extremity edema. On admission, BNP was 11,526, HS-troponin was 32. Chest x-ray noted mild central vascular congestion and small pleural effusions.   Pertinent cardiac history: Admitted in 07/2019 with anasarca and new onset CHF. Echo showed LVEF of 25-30% with grade II disatolic dysfunction, and moderately reduced RV function. GDMT was added and follow-up echo in 12/2019 showed LVEF improved to 50-55% with grade II diastolic dysfunction, and normal RV function. TTE in 05/2020 with LVEF of 45-50%. CMRI 06/2023 with LVEF 26%, CO 4.3, RVEF 28%, thin midwall basal septal LGE consistent with dilated NICM. TTE 06/2024 with LVEF 20-25%, moderate LVH, G2DD.  Pertinent Lab Values: Creatinine, Ser  Date Value Ref Range Status  07/12/2024 1.05 0.61 - 1.24 mg/dL Final   BUN  Date Value Ref Range Status  07/12/2024 19 8 - 23 mg/dL Final  87/90/7975 20 8 - 27 mg/dL Final   Potassium  Date Value Ref Range Status  07/12/2024 3.6 3.5 - 5.1 mmol/L Final   Sodium  Date Value Ref Range Status  07/12/2024 141 135 - 145 mmol/L Final  08/04/2023 143 134 - 144 mmol/L Final   B Natriuretic Peptide  Date Value Ref Range Status  04/14/2024 1,275.4 (H) 0.0 - 100.0 pg/mL Final    Comment:    Performed at [facility], [address] [place], [place] [address]   Magnesium   Date Value Ref Range Status  07/10/2024 1.9 1.7 - 2.4 mg/dL Final    Comment:    Performed at [facility], [address] [place], [place] [address]   Hgb A1c MFr Bld  Date Value Ref Range Status  07/14/2023 6.0 (H) 4.8 - 5.6 % Final    Comment:    (NOTE) Pre diabetes:          5.7%-6.4%  Diabetes:              >6.4%  Glycemic control for    <7.0% adults with diabetes    Digoxin  Level  Date Value Ref Range Status  07/22/2023 0.8 0.8 - 2.0 ng/mL Final    Comment:    Performed at [facility], [address] [place], [place] [address]   TSH  Date Value Ref Range Status  07/11/2023 4.190 0.350 - 4.500 uIU/mL Final    Comment:    Performed by a 3rd Generation assay with a functional sensitivity of <=0.01 uIU/mL. Performed at [facility], [address] [place], [place] [address]     Vital Signs:  Temp:  [97.9 F (36.6 C)-98.8 F (37.1 C)] 98 F (36.7 C) (11/17 0808) Pulse Rate:  [64-70] 65 (11/17 0808) Cardiac Rhythm: Normal sinus rhythm;Heart block;Ventricular tachycardia (11/17 0354) Resp:  [17-19] 18 (11/17 0808) BP: (114-195)/(70-105) 155/76 (11/17 0808) SpO2:  [97 %-100 %] 97 % (11/17 0808) Weight:  [84.4 kg (186 lb 1.1 oz)] 84.4 kg (186 lb 1.1 oz) (11/17 0500)  Intake/Output Summary (Last 24 hours) at 07/12/2024 0818 Last data filed at 07/12/2024 0356 Gross per 24 hour  Intake 480 ml  Output 2130 ml  Net -1650 ml    Current Heart Failure Medications:  Loop diuretic: furosemide  40 mg IV BID Beta-Blocker: carvedilol  25 mg BID ACEI/ARB/ARNI: Entresto  49-51 mg BID MRA: spironolactone  25 mg daily SGLT2i: Farxiga   10 mg daily Other:  Prior to admission Heart Failure Medications:  Loop diuretic: out for 1-2 months Beta-Blocker: out for 1-2 months ACEI/ARB/ARNI: out for 1-2 months MRA: spironolactone  25 mg daily SGLT2i: out for 1-2 months  Assessment: 1. Acute on chronic combined systolic and diastolic heart failure (LVEF 20-25%) with G2DD, due to NICM. NYHA class II symptoms.  -Symptoms: Denies shortness of breath today. Appetite is good. Only complain is LEE.  -Volume: Appears to still have a little volume on board. JVP slightly up. LEE appears improved with pruning. Good urine output yesterday on furosemide  40 mg IV BID. Labs are stable, would continue furosemide  IV at this  time. If patient is being discharged today, would send home on torsemide  20 mg daily. -Hemodynamics: BP elevated. HR 60s.  -BB: Continue carvedilol  at target dose of 25 mg BID. -ACEI/ARB/ARNI: Consider increasing Entresto  to 97-103 mg BID given HTN. -MRA: Continue spironolactone  at target dose of 25 mg daily.  -SGLT2i: Continue Farxiga  at target dose of 10 mg daily.  Plan: 1) Medication changes recommended at this time: -Consider increasing Entresto  to 97-103 mg BID  2) Patient assistance: -Copays for Entresto , Farxiga , and Jardiance are $4 -Patient instructed he may qualify for medicare extra help. Instructions provided on how to apply.   3) Education: - Patient has been educated on current HF medications and potential additions to HF medication regimen - Patient verbalizes understanding that over the next few months, these medication doses may change and more medications may be added to optimize HF regimen - Patient has been educated on basic disease state pathophysiology and goals of therapy  Medication Assistance / Insurance Benefits Check: Does the patient have prescription insurance?    Please do not hesitate to reach out with questions or concerns,  Jaun Bash, PharmD, CPP, BCPS, [facility] Heart Failure Pharmacist  Phone - [phone] 07/12/2024 10:02 AM

## 2024-07-12 NOTE — Plan of Care (Signed)
  Problem: Education: Goal: Ability to describe self-care measures that may prevent or decrease complications (Diabetes Survival Skills Education) will improve Outcome: Progressing Goal: Individualized Educational Video(s) Outcome: Progressing   Problem: Coping: Goal: Ability to adjust to condition or change in health will improve Outcome: Progressing   Problem: Fluid Volume: Goal: Ability to maintain a balanced intake and output will improve Outcome: Progressing   Problem: Health Behavior/Discharge Planning: Goal: Ability to identify and utilize available resources and services will improve Outcome: Progressing Goal: Ability to manage health-related needs will improve Outcome: Progressing   Problem: Metabolic: Goal: Ability to maintain appropriate glucose levels will improve Outcome: Progressing   Problem: Nutritional: Goal: Maintenance of adequate nutrition will improve Outcome: Progressing Goal: Progress toward achieving an optimal weight will improve Outcome: Progressing   Problem: Skin Integrity: Goal: Risk for impaired skin integrity will decrease Outcome: Progressing   Problem: Tissue Perfusion: Goal: Adequacy of tissue perfusion will improve Outcome: Progressing   Problem: Education: Goal: Knowledge of General Education information will improve Description: Including pain rating scale, medication(s)/side effects and non-pharmacologic comfort measures Outcome: Progressing   Problem: Health Behavior/Discharge Planning: Goal: Ability to manage health-related needs will improve Outcome: Progressing   Problem: Clinical Measurements: Goal: Ability to maintain clinical measurements within normal limits will improve Outcome: Progressing Goal: Will remain free from infection Outcome: Progressing Goal: Diagnostic test results will improve Outcome: Progressing Goal: Respiratory complications will improve Outcome: Progressing Goal: Cardiovascular complication will  be avoided Outcome: Progressing   Problem: Activity: Goal: Risk for activity intolerance will decrease Outcome: Progressing   Problem: Nutrition: Goal: Adequate nutrition will be maintained Outcome: Progressing   Problem: Coping: Goal: Level of anxiety will decrease Outcome: Progressing   Problem: Elimination: Goal: Will not experience complications related to bowel motility Outcome: Progressing Goal: Will not experience complications related to urinary retention Outcome: Progressing   Problem: Pain Managment: Goal: General experience of comfort will improve and/or be controlled Outcome: Progressing   Problem: Safety: Goal: Ability to remain free from injury will improve Outcome: Progressing   Problem: Skin Integrity: Goal: Risk for impaired skin integrity will decrease Outcome: Progressing   Problem: Education: Goal: Ability to demonstrate management of disease process will improve Outcome: Progressing Goal: Ability to verbalize understanding of medication therapies will improve Outcome: Progressing Goal: Individualized Educational Video(s) Outcome: Progressing   Problem: Activity: Goal: Capacity to carry out activities will improve Outcome: Progressing   Problem: Cardiac: Goal: Ability to achieve and maintain adequate cardiopulmonary perfusion will improve Outcome: Progressing   Problem: Education: Goal: Knowledge of General Education information will improve Description: Including pain rating scale, medication(s)/side effects and non-pharmacologic comfort measures Outcome: Progressing   Problem: Education: Goal: Ability to demonstrate management of disease process will improve Outcome: Progressing Goal: Ability to verbalize understanding of medication therapies will improve Outcome: Progressing Goal: Individualized Educational Video(s) Outcome: Progressing   Problem: Activity: Goal: Capacity to carry out activities will improve Outcome: Progressing    Problem: Cardiac: Goal: Ability to achieve and maintain adequate cardiopulmonary perfusion will improve Outcome: Progressing

## 2024-07-12 NOTE — Progress Notes (Signed)
 Heart Failure Navigator Progress Note  Current Advanced Heart Failure Team patient of Ellouise Class, FNP.  Education Assessment and Provision: Detailed education and instructions reviewed on heart failure disease management including the following:  Signs and symptoms of Heart Failure When to call the physician Importance of daily weights Low sodium diet Fluid restriction Medication management Anticipated future follow-up appointments  Patient education given on each of the above topics.  Patient acknowledges understanding via teach back method and acceptance of all instructions.  Education Materials:  Living Better With Heart Failure Booklet, HF zone tool, & Daily Weight Tracker Tool.  Patient has scale at home: Yes Patient has pill box at home: Yes    Patient is currently scheduled at the [facility] for a previously scheduled appointment on 07/26/24 @ 11:30.  Patient has transportation needs.  He currently lives in a boarding house and has difficultly getting to appointments.  Will consult SW for assistance with transportation needs.  Navigator can change that appointment to earlier date when those needs are assessed.   Charmaine Pines, RN, BSN [facility] Heart Failure Navigator Secure Chat Only

## 2024-07-13 ENCOUNTER — Other Ambulatory Visit: Payer: Self-pay

## 2024-07-13 DIAGNOSIS — I5023 Acute on chronic systolic (congestive) heart failure: Secondary | ICD-10-CM | POA: Diagnosis not present

## 2024-07-13 LAB — BASIC METABOLIC PANEL WITH GFR
Anion gap: 10 (ref 5–15)
BUN: 22 mg/dL (ref 8–23)
CO2: 28 mmol/L (ref 22–32)
Calcium: 8.7 mg/dL — ABNORMAL LOW (ref 8.9–10.3)
Chloride: 98 mmol/L (ref 98–111)
Creatinine, Ser: 1.14 mg/dL (ref 0.61–1.24)
GFR, Estimated: >60 mL/min (ref >60)
Glucose, Bld: 99 mg/dL (ref 70–99)
Potassium: 3.8 mmol/L (ref 3.5–5.1)
Sodium: 136 mmol/L (ref 135–145)

## 2024-07-13 MED ORDER — SACUBITRIL-VALSARTAN 49-51 MG PO TABS
2.0000 | ORAL_TABLET | Freq: Two times a day (BID) | ORAL | 0 refills | Status: DC
  Filled 2024-07-13: qty 60, 15d supply, fill #0
  Filled 2024-07-13: qty 60, 30d supply, fill #0

## 2024-07-13 MED ORDER — SPIRONOLACTONE 25 MG PO TABS
25.0000 mg | ORAL_TABLET | Freq: Every day | ORAL | 0 refills | Status: DC
  Filled 2024-07-13: qty 30, 30d supply, fill #0

## 2024-07-13 MED ORDER — ALBUTEROL SULFATE HFA 108 (90 BASE) MCG/ACT IN AERS
2.0000 | INHALATION_SPRAY | Freq: Four times a day (QID) | RESPIRATORY_TRACT | 0 refills | Status: AC | PRN
  Filled 2024-07-13: qty 6.7, 30d supply, fill #0

## 2024-07-13 MED ORDER — ATORVASTATIN CALCIUM 20 MG PO TABS
20.0000 mg | ORAL_TABLET | Freq: Every day | ORAL | 0 refills | Status: DC
  Filled 2024-07-13: qty 30, 30d supply, fill #0

## 2024-07-13 MED ORDER — TORSEMIDE 20 MG PO TABS
20.0000 mg | ORAL_TABLET | Freq: Every day | ORAL | 0 refills | Status: DC
  Filled 2024-07-13: qty 90, 90d supply, fill #0

## 2024-07-13 MED ORDER — APIXABAN 5 MG PO TABS
5.0000 mg | ORAL_TABLET | Freq: Two times a day (BID) | ORAL | 0 refills | Status: DC
  Filled 2024-07-13: qty 60, 30d supply, fill #0

## 2024-07-13 MED ORDER — CARVEDILOL 25 MG PO TABS
25.0000 mg | ORAL_TABLET | Freq: Two times a day (BID) | ORAL | 0 refills | Status: DC
  Filled 2024-07-13: qty 60, 30d supply, fill #0

## 2024-07-13 MED ORDER — DAPAGLIFLOZIN PROPANEDIOL 10 MG PO TABS
10.0000 mg | ORAL_TABLET | Freq: Every day | ORAL | 0 refills | Status: DC
  Filled 2024-07-13: qty 30, 30d supply, fill #0

## 2024-07-13 MED ORDER — ASCORBIC ACID 500 MG PO TABS
500.0000 mg | ORAL_TABLET | Freq: Every day | ORAL | 2 refills | Status: AC
  Filled 2024-07-13: qty 30, 30d supply, fill #0

## 2024-07-13 MED ORDER — TAMSULOSIN HCL 0.4 MG PO CAPS
0.4000 mg | ORAL_CAPSULE | Freq: Every day | ORAL | 0 refills | Status: AC
  Filled 2024-07-13: qty 30, 30d supply, fill #0

## 2024-07-13 NOTE — Plan of Care (Signed)
  Problem: Education: Goal: Ability to describe self-care measures that may prevent or decrease complications (Diabetes Survival Skills Education) will improve Outcome: Progressing Goal: Individualized Educational Video(s) Outcome: Progressing   Problem: Coping: Goal: Ability to adjust to condition or change in health will improve Outcome: Progressing   Problem: Fluid Volume: Goal: Ability to maintain a balanced intake and output will improve Outcome: Progressing   Problem: Health Behavior/Discharge Planning: Goal: Ability to identify and utilize available resources and services will improve Outcome: Progressing Goal: Ability to manage health-related needs will improve Outcome: Progressing   Problem: Metabolic: Goal: Ability to maintain appropriate glucose levels will improve Outcome: Progressing   Problem: Nutritional: Goal: Maintenance of adequate nutrition will improve Outcome: Progressing Goal: Progress toward achieving an optimal weight will improve Outcome: Progressing   Problem: Skin Integrity: Goal: Risk for impaired skin integrity will decrease Outcome: Progressing   Problem: Tissue Perfusion: Goal: Adequacy of tissue perfusion will improve Outcome: Progressing   Problem: Education: Goal: Knowledge of General Education information will improve Description: Including pain rating scale, medication(s)/side effects and non-pharmacologic comfort measures Outcome: Progressing   Problem: Health Behavior/Discharge Planning: Goal: Ability to manage health-related needs will improve Outcome: Progressing   Problem: Clinical Measurements: Goal: Ability to maintain clinical measurements within normal limits will improve Outcome: Progressing Goal: Will remain free from infection Outcome: Progressing Goal: Diagnostic test results will improve Outcome: Progressing Goal: Respiratory complications will improve Outcome: Progressing Goal: Cardiovascular complication will  be avoided Outcome: Progressing   Problem: Activity: Goal: Risk for activity intolerance will decrease Outcome: Progressing   Problem: Nutrition: Goal: Adequate nutrition will be maintained Outcome: Progressing   Problem: Coping: Goal: Level of anxiety will decrease Outcome: Progressing   Problem: Elimination: Goal: Will not experience complications related to bowel motility Outcome: Progressing Goal: Will not experience complications related to urinary retention Outcome: Progressing   Problem: Pain Managment: Goal: General experience of comfort will improve and/or be controlled Outcome: Progressing   Problem: Safety: Goal: Ability to remain free from injury will improve Outcome: Progressing   Problem: Skin Integrity: Goal: Risk for impaired skin integrity will decrease Outcome: Progressing   Problem: Education: Goal: Ability to demonstrate management of disease process will improve Outcome: Progressing Goal: Ability to verbalize understanding of medication therapies will improve Outcome: Progressing Goal: Individualized Educational Video(s) Outcome: Progressing   Problem: Activity: Goal: Capacity to carry out activities will improve Outcome: Progressing   Problem: Cardiac: Goal: Ability to achieve and maintain adequate cardiopulmonary perfusion will improve Outcome: Progressing   Problem: Education: Goal: Knowledge of General Education information will improve Description: Including pain rating scale, medication(s)/side effects and non-pharmacologic comfort measures Outcome: Progressing   Problem: Education: Goal: Ability to demonstrate management of disease process will improve Outcome: Progressing Goal: Ability to verbalize understanding of medication therapies will improve Outcome: Progressing Goal: Individualized Educational Video(s) Outcome: Progressing   Problem: Activity: Goal: Capacity to carry out activities will improve Outcome: Progressing    Problem: Cardiac: Goal: Ability to achieve and maintain adequate cardiopulmonary perfusion will improve Outcome: Progressing

## 2024-07-13 NOTE — Progress Notes (Addendum)
 Heart Failure Navigator Progress Note  Patient has a scheduled hospital follow-up with the AHF Clinic on 07/26/24 @ 11:30 [facility]. Transportation consult was placed and Case worker Awanya assisting patient with transportation concerns to that appointment.  Navigator will sign off at this time.  Charmaine Pines, RN, BSN [facility] Heart Failure Navigator Secure Chat Only

## 2024-07-13 NOTE — Discharge Summary (Signed)
 Physician Discharge Summary   Patient: Kevin [name] MRN: [id number]  DOB: [birth year]   Admit:     Date of Admission: 07/09/2024 Admitted from: home   Discharge: Date of discharge: 07/13/24 Disposition: Home Condition at discharge: good  CODE STATUS: FULL CODE     Discharge Physician: Laneta Blunt, DO [facility]     PCP: Pcp, No  Recommendations for Outpatient Follow-up:  Follow up with PCP / CHF clinic 1-2 weeks   Discharge Instructions     (HEART FAILURE PATIENTS) Call MD:  Anytime you have any of the following symptoms: 1) 3 pound weight gain in 24 hours or 5 pounds in 1 week 2) shortness of breath, with or without a dry hacking cough 3) swelling in the hands, feet or stomach 4) if you have to sleep on extra pillows at night in order to breathe.   Complete by: As directed    Diet - low sodium heart healthy   Complete by: As directed    Increase activity slowly   Complete by: As directed    No wound care   Complete by: As directed          Discharge Diagnoses: Principal Problem:   Acute on chronic systolic CHF (congestive heart failure) (HCC) Active Problems:   Hypertensive urgency   Myocardial injury   PAF (paroxysmal atrial fibrillation) (HCC)   COPD (chronic obstructive pulmonary disease) (HCC)   Dyslipidemia   Type 2 diabetes, controlled, with neuropathy (HCC)   Abnormal LFTs   Overweight (BMI 25.0-29.9)   CHF (congestive heart failure) [facility])      Hospital course / significant events:   Kevin [name] is a 68 y.o. male with medical history significant of sCHF with EF 20-25%, HTN, HLD, DM, codp, A-fib on Eliquis , anemia, former smoker, medication noncompliance, who presents with puffy eyes and leg swelling. Out of all meds for a week.   11/14: to ED. Admitted to hospitalist for CHF. Lasix  IV 80 mg IV in ED. UOP approx 2400 mL.  11/15: continue lasix  40 mg IV bid. BP still high. Echo stable from 1 year ago. D/w cardiology - given  stable echo and known cause for exacerbation, and given improvement, will resume meds and he can follow outpatient. Continuing diuresis through today and reassess tomorrow.  11/16: per pharmacy, pt never applied for medicare D so expect some Rx will be costly until this can be taken care of. Still diuresing, 5L off total as of mid-day today  11/17: 3L off yesterday. Renal fxn WNL. Continue diuresis  11/18: no edema/rales, UOP has slowed down and Cr very slightly up, rx confirmed and ok for dc w/ close follow up w/ CHF clinic      Consultants:  none  Procedures/Surgeries: none      ASSESSMENT & PLAN:   Acute on chronic systolic CHF (congestive heart failure) Echo on 07/12/2023 showed EF of 20-25%. Echo 07/10/2024 today - EF 20-25% Home furosmide --> Torsemide  20 mg daily  Entresto  49-51 mg bid --> to 97-103 mg  Continue --> Coreg  25 mg bid  Spironolactone  12.5 mg daily --> to 25 mg w/ high BP Continue --> Dapaglifozin 10 mg daily Daily weight, Low salt diet, Fluid restriction Curbside w/ cardiology - given stable echo and known cause for exacerbation, plan for outpatient follow up I do not feel need to consult them at this time   Medication access barrier - cost / coverage cited by patient as reason he was out  of meds  Pt never applied for Medicare D - provided instructions on this by pharmacist  Copays for Entresto , Farxiga , and Jardiance are $4  No problem expected w/ torsemide , carvedilol , spironolactone .  Ideally would stay on Eliquis  rather than switch to warfarin  - will d/w pharmacy   Hypertensive urgency - resolved due to medication noncompliance. Meds as above    PAF (paroxysmal atrial fibrillation)  Coreg , Eliquis   Myocardial injury without ACS Demand ischemia, no ACS Telemetry can continue given low EF Repeat troponin and 12-lead EKG if chest pain    COPD (chronic obstructive pulmonary disease) Not in exacerbation Bronchodilators Mucinex     Dyslipidemia Lipitor   Type 2 diabetes, controlled, with neuropathy Recent A1c 6.0.  Patient is taking Farxiga  at home Farxiga  SSI   Abnormal LFTs Likely due to liver congestion secondary to CHF exacerbation. Avoid using Tylenol  As needed ibuprofen  BPH Tamsulosin    Overweight based on BMI: Body mass index is 27.41 kg/m.Kevin [name] Significantly low or high BMI is associated with higher medical risk.  Underweight - under 18  overweight - 25 to 29 obese - 30 or more Class 1 obesity: BMI of 30.0 to 34 Class 2 obesity: BMI of 35.0 to 39 Class 3 obesity: BMI of 40.0 to 49 Super Morbid Obesity: BMI 50-59 Super-super Morbid Obesity: BMI 60+ Healthy nutrition and physical activity advised as adjunct to other disease management and risk reduction treatments           Discharge Instructions  Allergies as of 07/13/2024   No Known Allergies      Medication List     STOP taking these medications    furosemide  40 MG tablet Commonly known as: LASIX    Vitamin D  (Ergocalciferol ) 1.25 MG (50000 UNIT) Caps capsule Commonly known as: DRISDOL        TAKE these medications    albuterol  108 (90 Base) MCG/ACT inhaler Commonly known as: VENTOLIN  HFA Inhale 2 puffs into the lungs every 6 (six) hours as needed for wheezing or shortness of breath.   ascorbic acid  500 MG tablet Commonly known as: VITAMIN C  Take 1 tablet (500 mg total) by mouth daily.   atorvastatin 20 MG tablet Commonly known as: LIPITOR Take 1 tablet (20 mg total) by mouth daily.   Blood Pressure Kit 1 kit by Does not apply route daily.   carvedilol  25 MG tablet Commonly known as: COREG  Take 1 tablet (25 mg total) by mouth 2 (two) times daily with a meal.   Eliquis  5 MG Tabs tablet Generic drug: apixaban  Take 1 tablet (5 mg total) by mouth 2 (two) times daily.   Entresto  49-51 MG Generic drug: sacubitril -valsartan  Take 2 tablets by mouth 2 (two) times daily. What changed: how much to take    Farxiga  10 MG Tabs tablet Generic drug: dapagliflozin  propanediol Take 1 tablet (10 mg total) by mouth daily.   Ferrex 150 150 MG capsule Generic drug: iron  polysaccharides Take 1 capsule (150 mg total) by mouth daily.   spironolactone  25 MG tablet Commonly known as: ALDACTONE  Take 1 tablet (25 mg total) by mouth daily. What changed: how much to take   tamsulosin  0.4 MG Caps capsule Commonly known as: FLOMAX  Take 1 capsule (0.4 mg total) by mouth daily after supper.   torsemide  20 MG tablet Commonly known as: DEMADEX  Take 1 tablet (20 mg total) by mouth daily.         Follow-up Information     [facility]. Go  on 07/26/2024.   Specialty: Cardiology Why: Hospital Follow-Up. 07/26/24 @ 11:30 Please bring all medications to follow-up appointment [facility], [address] Free Valet Parking at the door Contact information: [address] [place]  [address] [phone]                No Known Allergies   Subjective: pt feeling better today, ready to go home, no SOB or CP, no edema   Discharge Exam: BP 106/70 (BP Location: Left Arm)   Pulse (!) 57   Temp 97.7 F (36.5 C)   Resp 18   Ht 5' 7 (1.702 m)   Wt 79.7 kg   SpO2 95%   BMI 27.52 kg/m  General: Pt is alert, awake, not in acute distress Cardiovascular: RRR, S1/S2 +, no rubs, no gallops Respiratory: CTA bilaterally, no wheezing, no rhonchi Abdominal: Soft, NT, ND, Extremities: no edema, no cyanosis     The results of significant diagnostics from this hospitalization (including imaging, microbiology, ancillary and laboratory) are listed below for reference.     Microbiology: No results found for this or any previous visit (from the past 240 hours).   Labs: BNP (last 3 results) Recent Labs    04/03/24 0420 04/14/24 1034  BNP 3,957.9* 1,275.4*   Basic Metabolic Panel: Recent Labs  Lab  07/09/24 1522 07/10/24 0343 07/11/24 0430 07/12/24 0453 07/12/24 1341 07/13/24 0457  NA 147* 145 143 141 139 136  K 3.7 3.6 3.5 3.6 3.8 3.8  CL 111 110 105 102 101 98  CO2 26 28 29 30  32 28  GLUCOSE 106* 103* 106* 108* 103* 99  BUN 22 22 18 19 19 22   CREATININE 0.97 0.98 0.98 1.05 1.09 1.14  CALCIUM  8.7* 8.6* 8.5* 8.4* 8.4* 8.7*  MG 2.0 1.9  --   --   --   --    Liver Function Tests: Recent Labs  Lab 07/09/24 1522 07/12/24 1341  AST 39 23  ALT 180* 84*  ALKPHOS 117 102  BILITOT 1.0 1.0  PROT 6.5 6.0*  ALBUMIN 3.6 3.3*   Recent Labs  Lab 07/12/24 1341  LIPASE 23   No results for input(s): AMMONIA in the last 168 hours. CBC: Recent Labs  Lab 07/09/24 1522 07/10/24 0343 07/12/24 1341  WBC 6.6 7.0 7.7  NEUTROABS 2.9  --   --   HGB 11.5* 11.1* 12.2*  HCT 35.5* 33.8* 36.8*  MCV 94.4 93.6 93.2  PLT 169 165 202   Cardiac Enzymes: No results for input(s): CKTOTAL, CKMB, CKMBINDEX, TROPONINI in the last 168 hours. BNP: Invalid input(s): POCBNP CBG: Recent Labs  Lab 07/10/24 1145 07/10/24 1523 07/10/24 2113 07/11/24 0540 07/11/24 0716  GLUCAP 81 106* 110* 133* 97   D-Dimer No results for input(s): DDIMER in the last 72 hours. Hgb A1c No results for input(s): HGBA1C in the last 72 hours. Lipid Profile No results for input(s): CHOL, HDL, LDLCALC, TRIG, CHOLHDL, LDLDIRECT in the last 72 hours. Thyroid  function studies No results for input(s): TSH, T4TOTAL, T3FREE, THYROIDAB in the last 72 hours.  Invalid input(s): FREET3 Anemia work up No results for input(s): VITAMINB12, FOLATE, FERRITIN, TIBC, IRON , RETICCTPCT in the last 72 hours. Urinalysis    Component Value Date/Time   COLORURINE YELLOW (A) 04/09/2024 0958   APPEARANCEUR CLEAR (A) 04/09/2024 0958   APPEARANCEUR Clear 10/04/2020 1312   LABSPEC 1.011 04/09/2024 0958   PHURINE 7.0 04/09/2024 0958   GLUCOSEU NEGATIVE 04/09/2024 9041  HGBUR  NEGATIVE 04/09/2024 0958   BILIRUBINUR NEGATIVE 04/09/2024 0958   BILIRUBINUR Negative 10/04/2020 1312   KETONESUR NEGATIVE 04/09/2024 0958   PROTEINUR NEGATIVE 04/09/2024 0958   NITRITE NEGATIVE 04/09/2024 0958   LEUKOCYTESUR NEGATIVE 04/09/2024 0958   Sepsis Labs Recent Labs  Lab 07/09/24 1522 07/10/24 0343 07/12/24 1341  WBC 6.6 7.0 7.7   Microbiology No results found for this or any previous visit (from the past 240 hours). Imaging ECHOCARDIOGRAM COMPLETE Result Date: 07/10/2024    ECHOCARDIOGRAM REPORT   Patient Name:   SHIMON TROWBRIDGE Date of Exam: 07/10/2024 Medical Rec #:  [id number]   Height:       67.0 in Accession #:    [id number]  Weight:       175.0 lb Date of Birth:  12-03-1955    BSA:          1.911 m Patient Age:    68 years    BP:           226/136 mmHg Patient Gender: M           HR:           66 bpm. Exam Location:  Inpatient Procedure: 2D Echo, 3D Echo, Cardiac Doppler, Color Doppler, Strain Analysis and            Intracardiac Opacification Agent (Both Spectral and Color Flow            Doppler were utilized during procedure). Indications:     CHF-Acute Systolic  History:         Patient has prior history of Echocardiogram examinations, most                  recent 07/17/2023. CHF and Cardiomyopathy, COPD; Risk                  Factors:Hypertension and Former Smoker.  Sonographer:     Logan Shove RDCS Referring Phys:  [id number] XILIN NIU Diagnosing Phys: Annabella Scarce MD IMPRESSIONS  1. Left ventricular ejection fraction, by estimation, is 20 to 25%. Left ventricular ejection fraction by 3D volume is 22 %. The left ventricle has severely decreased function. The left ventricle demonstrates global hypokinesis. There is moderate asymmetric left ventricular hypertrophy of the septal segment. Left ventricular diastolic parameters are consistent with Grade II diastolic dysfunction (pseudonormalization). Elevated left ventricular end-diastolic pressure. The average left ventricular global  longitudinal strain is -6.4 %. The global longitudinal strain is abnormal.  2. Right ventricular systolic function is moderately reduced. The right ventricular size is normal. There is severely elevated pulmonary artery systolic pressure.  3. Left atrial size was severely dilated.  4. Right atrial size was severely dilated.  5. The mitral valve is normal in structure. No evidence of mitral valve regurgitation. No evidence of mitral stenosis.  6. Functional tricuspid regurgitation due to annular dilitation. Tricuspid valve regurgitation is moderate.  7. The aortic valve is tricuspid. Aortic valve regurgitation is trivial. No aortic stenosis is present.  8. The inferior vena cava is dilated in size with <50% respiratory variability, suggesting right atrial pressure of 15 mmHg. FINDINGS  Left Ventricle: Left ventricular ejection fraction, by estimation, is 20 to 25%. Left ventricular ejection fraction by 3D volume is 22 %. The left ventricle has severely decreased function. The left ventricle demonstrates global hypokinesis. Definity contrast agent was given IV to delineate the left ventricular endocardial borders. The average left ventricular global longitudinal strain is -6.4 %. Strain was performed and the global  longitudinal strain is abnormal. The left ventricular internal cavity size was normal in size. There is moderate asymmetric left ventricular hypertrophy of the septal segment. Left ventricular diastolic parameters are consistent with Grade II diastolic dysfunction (pseudonormalization). Elevated left ventricular end-diastolic pressure. Right Ventricle: The right ventricular size is normal. No increase in right ventricular wall thickness. Right ventricular systolic function is moderately reduced. There is severely elevated pulmonary artery systolic pressure. The tricuspid regurgitant velocity is 3.83 m/s, and with an assumed right atrial pressure of 15 mmHg, the estimated right ventricular systolic pressure  is 73.7 mmHg. Left Atrium: Left atrial size was severely dilated. Right Atrium: Right atrial size was severely dilated. Pericardium: There is no evidence of pericardial effusion. Mitral Valve: The mitral valve is normal in structure. No evidence of mitral valve regurgitation. No evidence of mitral valve stenosis. Tricuspid Valve: Functional tricuspid regurgitation due to annular dilitation. The tricuspid valve is normal in structure. Tricuspid valve regurgitation is moderate . No evidence of tricuspid stenosis. Aortic Valve: The aortic valve is tricuspid. Aortic valve regurgitation is trivial. No aortic stenosis is present. Aortic valve peak gradient measures 3.1 mmHg. Pulmonic Valve: The pulmonic valve was normal in structure. Pulmonic valve regurgitation is mild. No evidence of pulmonic stenosis. Aorta: The aortic root is normal in size and structure. Venous: The inferior vena cava is dilated in size with less than 50% respiratory variability, suggesting right atrial pressure of 15 mmHg. IAS/Shunts: No atrial level shunt detected by color flow Doppler. Additional Comments: 3D was performed not requiring image post processing on an independent workstation and was abnormal.  LEFT VENTRICLE PLAX 2D LVIDd:         5.10 cm         Diastology LVIDs:         4.60 cm         LV e' lateral:   4.30 cm/s LV PW:         1.20 cm         LV E/e' lateral: 21.3 LV IVS:        1.60 cm LVOT diam:     2.10 cm         2D Longitudinal LVOT Area:     3.46 cm        Strain                                2D Strain GLS   -6.5 %                                (A4C): LV Volumes (MOD)               2D Strain GLS   -6.2 % LV vol d, MOD    163.0 ml      (A3C): A2C:                           2D Strain GLS   -6.5 % LV vol d, MOD    141.0 ml      (A2C): A4C:                           2D Strain GLS   -6.4 % LV vol s, MOD    123.0 ml      Avg:  A2C: LV vol s, MOD    101.0 ml      3D Volume EF A4C:                           LV 3D EF:    Left LV SV  MOD A2C:   40.0 ml                    ventricul LV SV MOD A4C:   141.0 ml                   ar LV SV MOD BP:    37.6 ml                    ejection                                             fraction                                             by 3D                                             volume is                                             22 %.                                 3D Volume EF:                                3D EF:        22 %                                LV EDV:       196 ml                                LV ESV:       153 ml                                LV SV:        43 ml RIGHT VENTRICLE            IVC RV Basal diam:  4.70 cm    IVC diam: 2.60 cm RV Mid diam:    2.30 cm RV S prime:     9.41 cm/s TAPSE (M-mode): 1.9 cm LEFT ATRIUM             Index        RIGHT ATRIUM  Index LA diam:        4.70 cm 2.46 cm/m   RA Area:     33.00 cm LA Vol (A2C):   93.5 ml 48.94 ml/m  RA Volume:   128.00 ml 66.99 ml/m LA Vol (A4C):   78.8 ml 41.24 ml/m LA Biplane Vol: 94.2 ml 49.30 ml/m  AORTIC VALVE AV Area (Vmax): 2.62 cm AV Vmax:        88.50 cm/s AV Peak Grad:   3.1 mmHg LVOT Vmax:      66.90 cm/s  AORTA Ao Root diam: 2.90 cm Ao Asc diam:  3.40 cm MITRAL VALVE               TRICUSPID VALVE MV Area (PHT): 3.79 cm    TV Peak grad:   57.8 mmHg MV Decel Time: 200 msec    TV Vmax:        3.80 m/s MV E velocity: 91.50 cm/s  TR Peak grad:   58.7 mmHg                            TR Mean grad:   37.0 mmHg                            TR Vmax:        383.00 cm/s                            TR Vmean:       282.0 cm/s                             SHUNTS                            Systemic Diam: 2.10 cm Annabella Scarce MD Electronically signed by Annabella Scarce MD Signature Date/Time: 07/10/2024/2:11:26 PM    Final       Time coordinating discharge: over 30 minutes  SIGNED:  Laneta Blunt DO [facility]

## 2024-07-13 NOTE — TOC Initial Note (Signed)
 Transition of Care [facility]) - Initial/Assessment Note    Patient Details  Name: Kevin [name] MRN: [id number] Date of Birth: [birth year]  Transition of [facility]) CM/SW Contact:    Racheal LITTIE Schimke, RN Phone Number: 07/13/2024, 11:16 AM  Clinical Narrative: CMRN  spoke with patient at bedside, patient alert and oriented x4 in NAD. Heart Failure Navigator, Charmaine Pines also at bedside. Patient lives in boarding trailor for 3 weeks now, managed by [name] Manns [phone] who will provide transport at discharge and to Heart Failure Clinic 07/26/24 @ 11:30 am. Patient ambulates with rollator. No TOC needs identified at this time.                  Patient Goals and CMS Choice            Expected Discharge Plan and Services                                              Prior Living Arrangements/Services                       Activities of Daily Living   ADL Screening (condition at time of admission) Independently performs ADLs?: Yes (appropriate for developmental age) Is the patient deaf or have difficulty hearing?: No Does the patient have difficulty seeing, even when wearing glasses/contacts?: No Does the patient have difficulty concentrating, remembering, or making decisions?: No  Permission Sought/Granted                  Emotional Assessment              Admission diagnosis:  Anasarca [R60.1] CHF (congestive heart failure) (HCC) [I50.9] Hypertensive urgency [I16.0] Uncontrolled hypertension [I10] Patient Active Problem List   Diagnosis Date Noted   Hypertensive urgency 07/09/2024   Acute on chronic systolic CHF (congestive heart failure) (HCC) 07/09/2024   PAF (paroxysmal atrial fibrillation) (HCC) 07/09/2024   Overweight (BMI 25.0-29.9) 07/09/2024   Abnormal LFTs 07/09/2024   Myocardial injury 07/09/2024   Acute on chronic combined systolic and diastolic CHF, NYHA class 4 (HCC) 04/03/2024   CHF (congestive heart failure) (HCC)  04/03/2024   Noncompliance with medication regimen 07/13/2023   Acute on chronic congestive heart failure (HCC) 07/13/2023   Transaminitis 07/12/2023   Acute on chronic combined systolic and diastolic CHF (congestive heart failure) (HCC) 07/11/2023   Essential hypertension 07/11/2023   Peripheral neuropathy 07/11/2023   Dyslipidemia 07/11/2023   Hypokalemia 07/11/2023   COPD (chronic obstructive pulmonary disease) (HCC) 06/12/2020   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 ([address]), [place] - [address] [place]) [place] [address] Phone: [phone] Fax: [phone]  [facility] [address] [place] [address] Phone: [phone] Fax: [phone]  [id number] - [place], [place] - [address] [place] [address] Phone: [phone] Fax: [phone]     Social Drivers of Health (SDOH) Social  History: SDOH Screenings   Food Insecurity: No Food Insecurity (07/11/2024)  Housing: Low Risk  (07/11/2024)  Transportation Needs: Unmet Transportation Needs (07/11/2024)  Utilities: Not At Risk (07/11/2024)  Alcohol Screen: Low Risk  (07/14/2023)  Depression (PHQ2-9): Low Risk  (09/18/2021)  Financial Resource Strain: High Risk (04/05/2024)  Physical Activity: Insufficiently Active (01/20/2020)  Social Connections: Moderately Integrated (07/11/2024)  Stress: No Stress Concern Present (08/04/2019)  Tobacco Use: Medium Risk (07/09/2024)   SDOH Interventions:     Readmission Risk Interventions     No data to  display

## 2024-07-21 ENCOUNTER — Telehealth: Payer: Self-pay | Admitting: Family

## 2024-07-21 NOTE — Telephone Encounter (Signed)
 Called to confirm/remind patient of their appointment at the [facility] on 07/26/24.   Appointment:   [] Confirmed  [x] Left mess   [] No answer/No voice mail  [] VM Full/unable to leave message  [] Phone not in service  Patient reminded to bring all medications and/or complete list.  Confirmed patient has transportation. Gave directions, instructed to utilize valet parking.

## 2024-07-25 NOTE — Progress Notes (Unsigned)
 Advanced [facility] Note    Referring Physician: 08/25 admission PCP: Pcp, No Cardiologist: Redell Cave, MD   Chief Complaint: fatigue   HPI:  Kevin [name] is a 68 y/o male with a history of HTN, dyslipidemia, transaminitis, vitamin D  deficiency, T2DM, atrial flutter/ fibrillation, (s/p cardioversion 07/17/23), COPD, iron  deficient anemia (08/25), tobacco use and chronic heart failure.   Echo 08/04/2019: EF of 25-30% along with mild/moderate TR.  Echo 01/13/20: EF of 50-55% with moderate LVH.   Admitted 07/10/23 due to worsening shortness of breath, orthopnea, PND and worsening swelling in legs/ feet. Found to have HF exacerbation. Echo 07/12/2023 showed EF estimated at 20 to 25%, moderate LVH, moderately reduced RV function, indeterminate LV diastolic parameters, moderate to severe tricuspid regurgitation.  TEE 07/17/23: no thrombus. Cardioverted 07/17/23. Cardiac MRI 07/18/23: showed severely reduced RV function and LV systolic function but no evidence of myocardial infiltrative disease.  Admitted 04/03/24 with increasing shortness of breath and lower extremity swelling for the last month. Has been in [place] since March 2025. BP in the ED was 185/125 and oxygen saturation was 97% on room air. Chest x-ray showed cardiomegaly and pulmonary congestion. IV diuresed with lasix  drip with transition to oral diuretics. X-ray Left foot: No fracture, nonspecific edema. Augmentin  given for possible foot cellulitis. NSVT on telemetry on 04/04/2024. Elevated liver enzymes: Probably from hepatic congestion from CHF. Liver ultrasound did not show any acute findings.Tsat 6%, Venofer  300 mg IV one-time dose given.   Was in the ED 04/20/24 due to wounds on bilateral feet. Xrays negative except for mild soft tissue swelling. Antibiotic and bacitracin  ointment given.   Was in the ED 06/18/24 due to bilateral foot pain/ swelling. Continues with wound on left anterior / lateral foot.  Bilateral foot xrays negative for acute bony process. Right foot imaging notes possible ulceration.   He presents today for a follow-up HF visit with a chief complaint of fatigue. 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 Continues with a wound on his left foot and a healed wound on his right foot. Denies SOB, chest pain, dizziness, palpitations. Using a walker to walk because he says his feet hurt so much that it makes it difficult to walk.   ROS: All systems negative except what is listed in HPI, PMH and Problem List  Past Medical History:  Diagnosis Date   CHF (congestive heart failure) (HCC)    COPD (chronic obstructive pulmonary disease) (HCC)    Diabetes mellitus without complication (HCC)    Heart failure with mid-range ejection fraction (HCC)    a. 2019 reported neg stress test and nl cors on cath in [facility]; b. 07/2019 Echo: EF 25-30%, Gr2 DD, glob HK. Mod reduced RV fxn w/ volume overload. Mod dil RA. Mild to mod TR. Mod elev PASP; b. 12/2019 Echo: EF 50-55%; c. 05/2020 Echo: EF 45-50%, glob HK, sev LVH, GrII DD, nl RV size/fxn, mildly BAE, mild AoV sclerosis, mildly dilated Ao root.   Hypertension    NICM (nonischemic cardiomyopathy) (HCC)    Noncompliance    Pleural effusion on right    a. 07/2019 Thoracentesis: 300 ml.   Tobacco abuse     Current Outpatient Medications  Medication Sig Dispense Refill   albuterol  (VENTOLIN  HFA) 108 (90 Base) MCG/ACT inhaler Inhale 2 puffs into the lungs every 6 (six) hours as needed for wheezing or shortness of breath. 6.7  g 0   apixaban  (ELIQUIS ) 5 MG TABS tablet Take 1 tablet (5 mg total) by mouth 2 (two) times daily. 60 tablet 0   ascorbic acid  (VITAMIN C ) 500 MG tablet Take 1 tablet (500 mg total) by mouth daily. 30 tablet 2   atorvastatin  (LIPITOR) 20 MG tablet Take 1 tablet (20 mg total) by mouth daily. 30 tablet 0   Blood Pressure KIT 1 kit by Does  not apply route daily. 1 kit 0   carvedilol  (COREG ) 25 MG tablet Take 1 tablet (25 mg total) by mouth 2 (two) times daily with a meal. 60 tablet 0   dapagliflozin  propanediol (FARXIGA ) 10 MG TABS tablet Take 1 tablet (10 mg total) by mouth daily. 30 tablet 0   iron  polysaccharides (NIFEREX) 150 MG capsule Take 1 capsule (150 mg total) by mouth daily. 30 capsule 2   sacubitril -valsartan  (ENTRESTO ) 49-51 MG Take 2 tablets by mouth 2 (two) times daily. 120 tablet 0   spironolactone  (ALDACTONE ) 25 MG tablet Take 1 tablet (25 mg total) by mouth daily. 30 tablet 0   tamsulosin  (FLOMAX ) 0.4 MG CAPS capsule Take 1 capsule (0.4 mg total) by mouth daily after supper. 30 capsule 0   torsemide  (DEMADEX ) 20 MG tablet Take 1 tablet (20 mg total) by mouth daily. 90 tablet 0   No current facility-administered medications for this visit.    No Known Allergies    Social History   Socioeconomic History   Marital status: Single    Spouse name: Not on file   Number of children: 2   Years of education: Not on file   Highest education level: High school graduate  Occupational History   Occupation: unemployed  Tobacco Use   Smoking status: Former    Current packs/day: 0.50    Average packs/day: 0.3 packs/day for 25.9 years (8.0 ttl pk-yrs)    Types: Cigarettes    Start date: 2020   Smokeless tobacco: Never   Tobacco comments:    Stopped 2 years ago. 2023  Vaping Use   Vaping status: Never Used  Substance and Sexual Activity   Alcohol use: Never   Drug use: Not Currently   Sexual activity: Not Currently  Other Topics Concern   Not on file  Social History Narrative   Patient moved to [place]  from [place]  in March 2020, to take care of his Mother. She lives in a community managed by her church, independent living for seniors. Patient lives with his Mother, does not drive, and reports he mostly keeps to himself.   Social Drivers of Corporate Investment Banker Strain: High Risk  (04/05/2024)   Overall Financial Resource Strain (CARDIA)    Difficulty of Paying Living Expenses: Hard  Food Insecurity: No Food Insecurity (07/11/2024)   Hunger Vital Sign    Worried About Running Out of Food in the Last Year: Never true    Ran Out of Food in the Last Year: Never true  Transportation Needs: Unmet Transportation Needs (07/11/2024)   PRAPARE - Administrator, Civil Service (Medical): Yes    Lack of Transportation (Non-Medical): Yes  Physical Activity: Insufficiently Active (01/20/2020)   Exercise Vital Sign    Days of Exercise per Week: 2 days    Minutes of Exercise per Session: 30 min  Stress: No Stress Concern Present (08/04/2019)   [employer] of Occupational Health - Occupational Stress Questionnaire    Feeling of Stress : Not at all  Social Connections: Moderately Integrated (  07/11/2024)   Social Connection and Isolation Panel    Frequency of Communication with Friends and Family: More than three times a week    Frequency of Social Gatherings with Friends and Family: Once a week    Attends Religious Services: 1 to 4 times per year    Active Member of [employer] or Organizations: No    Attends Banker Meetings: 1 to 4 times per year    Marital Status: Never married  Intimate Partner Violence: Not At Risk (07/11/2024)   Humiliation, Afraid, Rape, and Kick questionnaire    Fear of Current or Ex-Partner: No    Emotionally Abused: No    Physically Abused: No    Sexually Abused: No     No family history on file.  There were no vitals filed for this visit.  Wt Readings from Last 3 Encounters:  07/13/24 175 lb 11.3 oz (79.7 kg)  06/21/24 181 lb (82.1 kg)  06/18/24 170 lb (77.1 kg)   Lab Results  Component Value Date   CREATININE 1.14 07/13/2024   CREATININE 1.09 07/12/2024   CREATININE 1.05 07/12/2024    PHYSICAL EXAM:  General: Well appearing.  Cor: No JVD. Regular rhythm, rate.  Lungs: clear Abdomen: soft, nontender,  nondistended. Extremities: 1+ pitting edema bilateral lower legs with L>R. Healed wound on dorsum on right foot. Dorsum of left foot has ulcer. No drainage noted.  Neuro:. Affect pleasant    ECG: not done   ASSESSMENT & PLAN:  1: NICM with reduced ejection fraction- - suspect due to HTN - NYHA class II - euvolemic today - weight up 20 pounds from last visit here 2 months ago. Has recently been released from jail, says that he ate good while incarcerated.  - Echo 08/04/2019: EF of 25-30% along with mild/moderate TR.  - Echo 01/13/20: EF of 50-55% with moderate LVH.  - Echo 07/12/23: EF 20-25% with moderate LVH, normal PA pressure of 35.9 mmHg, moderate LAE, mild Kevin, moderate/severe TR - TEE 07/17/23: no thrombus - cMRI 07/18/23:    ECV 33%   1. Mildly dilated LV size, severely reduced LV systolic function. LVEF 26%.    2. There is a thin, midwall, basal septal LGE.   3. Severely reduced RV function.   4. No significant valvular abnormalities.   5. No evidence for myocardial infiltrative disease.   6. Findings consistent with dilated nonischemic cardiomyopathy. - Updated echo ordered today - continue carvedilol  25mg  BID - continue farxiga  10mg  daily - continue furosemide  40mg  daily - continue entresto  49/51mg  BID - continue spironolactone  12.5mg  daily; BP will not allow for titration - saw cardiology (Agbor-Etang) 05/21 - BNP 04/14/24 was 1275.4  2: HTN- - BP 100/69 - had been receiving primary care at detention center but since now released, he will need PCP options - BMP 06/18/24 reviewed: sodium 141, potassium 4.3, creatinine 1.01 and GFR >60  3: COPD-  - using nebulizer and inhalers - no longer smoking  4: PAF- - cardioverted 07/17/23 - continue apixaban  5mg  BID - no bleeding noted  5: Left foot wound- - continues to use bacitracin  ointment - wound center referral placed today   Return in 1 month, sooner if needed.   I spent 35 minutes reviewing records,  interviewing/ examing patient and managing plan/ orders.   Ellouise DELENA Class FNP-C 07/25/24

## 2024-07-26 ENCOUNTER — Other Ambulatory Visit
Admission: RE | Admit: 2024-07-26 | Discharge: 2024-07-26 | Disposition: A | Source: Ambulatory Visit | Attending: Family | Admitting: Family

## 2024-07-26 ENCOUNTER — Telehealth (HOSPITAL_COMMUNITY): Payer: Self-pay | Admitting: Licensed Clinical Social Worker

## 2024-07-26 ENCOUNTER — Encounter: Payer: Self-pay | Admitting: Family

## 2024-07-26 ENCOUNTER — Ambulatory Visit: Admitting: Family

## 2024-07-26 ENCOUNTER — Ambulatory Visit: Payer: Self-pay | Admitting: Family

## 2024-07-26 VITALS — BP 152/97 | HR 55 | Wt 177.6 lb

## 2024-07-26 DIAGNOSIS — I5022 Chronic systolic (congestive) heart failure: Secondary | ICD-10-CM

## 2024-07-26 DIAGNOSIS — I081 Rheumatic disorders of both mitral and tricuspid valves: Secondary | ICD-10-CM | POA: Diagnosis not present

## 2024-07-26 DIAGNOSIS — J449 Chronic obstructive pulmonary disease, unspecified: Secondary | ICD-10-CM | POA: Diagnosis not present

## 2024-07-26 DIAGNOSIS — D509 Iron deficiency anemia, unspecified: Secondary | ICD-10-CM | POA: Diagnosis not present

## 2024-07-26 DIAGNOSIS — I420 Dilated cardiomyopathy: Secondary | ICD-10-CM | POA: Insufficient documentation

## 2024-07-26 DIAGNOSIS — I480 Paroxysmal atrial fibrillation: Secondary | ICD-10-CM | POA: Diagnosis not present

## 2024-07-26 DIAGNOSIS — Z79899 Other long term (current) drug therapy: Secondary | ICD-10-CM | POA: Insufficient documentation

## 2024-07-26 DIAGNOSIS — E119 Type 2 diabetes mellitus without complications: Secondary | ICD-10-CM | POA: Insufficient documentation

## 2024-07-26 DIAGNOSIS — I110 Hypertensive heart disease with heart failure: Secondary | ICD-10-CM | POA: Diagnosis not present

## 2024-07-26 DIAGNOSIS — Z7901 Long term (current) use of anticoagulants: Secondary | ICD-10-CM | POA: Diagnosis not present

## 2024-07-26 DIAGNOSIS — R7989 Other specified abnormal findings of blood chemistry: Secondary | ICD-10-CM | POA: Insufficient documentation

## 2024-07-26 DIAGNOSIS — E785 Hyperlipidemia, unspecified: Secondary | ICD-10-CM | POA: Insufficient documentation

## 2024-07-26 DIAGNOSIS — E559 Vitamin D deficiency, unspecified: Secondary | ICD-10-CM | POA: Diagnosis not present

## 2024-07-26 DIAGNOSIS — I10 Essential (primary) hypertension: Secondary | ICD-10-CM

## 2024-07-26 LAB — BASIC METABOLIC PANEL WITH GFR
Anion gap: 10 (ref 5–15)
BUN: 31 mg/dL — ABNORMAL HIGH (ref 8–23)
CO2: 30 mmol/L (ref 22–32)
Calcium: 9.2 mg/dL (ref 8.9–10.3)
Chloride: 102 mmol/L (ref 98–111)
Creatinine, Ser: 1.33 mg/dL — ABNORMAL HIGH (ref 0.61–1.24)
GFR, Estimated: 59 mL/min — ABNORMAL LOW (ref >60)
Glucose, Bld: 143 mg/dL — ABNORMAL HIGH (ref 70–99)
Potassium: 4.8 mmol/L (ref 3.5–5.1)
Sodium: 141 mmol/L (ref 135–145)

## 2024-07-26 LAB — PRO BRAIN NATRIURETIC PEPTIDE: Pro Brain Natriuretic Peptide: 948 pg/mL — ABNORMAL HIGH (ref <300.0)

## 2024-07-26 MED ORDER — SACUBITRIL-VALSARTAN 97-103 MG PO TABS: 1.0000 | ORAL_TABLET | Freq: Two times a day (BID) | ORAL | 6 refills | Status: DC

## 2024-07-26 NOTE — Telephone Encounter (Signed)
 H&V Care Navigation CSW Progress Note  Clinical Social Worker consulted to assist with transportation home- patient poor source of information and unclear about how he got to clinic- CSW had called him to discuss transportation and he had told CSW that he had no ride then showed up for appt.  CSW assisted with voucher for pt to return home but then received call from pt landlord- Kevin who also assist her residents with case management.  She had brought to appt and is aware of 12/3 appt and can take to that appt as well. Kevin [name] [phone]   No further CSW needs at this time  Kevin HILARIO Leech, LCSW Clinical Social Worker [facility] Desk#: [phone] Cell#: [phone]

## 2024-07-26 NOTE — Patient Instructions (Addendum)
 It was good to see you today!  Medication Changes:  INCREASE Entresto  to 97-103mg  (1 tab) two times daily  Lab Work:  Go over to the [facility]. Go pass the gift shop and have your blood work completed.   We will only call you if the results are abnormal or if the provider would like to make medication changes.  No news is good news.   Follow-Up in: Please follow up with the Advanced [facility] Failure Clinic in 1 month with Ellouise Class, FNP.   Thank you for choosing [facility].    At the [facility] Clinic, you and your health needs are our priority. We have a designated team specialized in the treatment of Heart Failure. This Care Team includes your primary Heart Failure Specialized Cardiologist (physician), Advanced Practice Providers (APPs- Physician Assistants and Nurse Practitioners), and Pharmacist who all work together to provide you with the care you need, when you need it.   You may see any of the following providers on your designated Care Team at your next follow up:  Dr. Toribio Fuel Dr. Ezra Shuck Dr. Ria Commander Dr. Morene Brownie Ellouise Class, FNP Jaun Bash, RPH-CPP  Please be sure to bring in all your medications bottles to every appointment.   Need to Contact Us :  If you have any questions or concerns before your next appointment please send us  a message through [place] or call our office at [phone].    TO LEAVE A MESSAGE FOR THE NURSE SELECT OPTION 2, PLEASE LEAVE A MESSAGE INCLUDING: YOUR NAME DATE OF BIRTH CALL BACK NUMBER REASON FOR CALL**this is important as we prioritize the call backs  YOU WILL RECEIVE A CALL BACK THE SAME DAY AS LONG AS YOU CALL BEFORE 4:00 PM

## 2024-07-27 ENCOUNTER — Encounter: Payer: Self-pay | Admitting: Physician Assistant

## 2024-07-27 NOTE — Progress Notes (Deleted)
 Cardiology Office Note    Date:  07/27/2024   ID:  Kevin [name], DOB 10-22-1955, MRN [id number]  PCP:  Pcp, No  Cardiologist:  Redell Cave, MD  Electrophysiologist:  None   Chief Complaint: Follow up  History of Present Illness:   Kevin [name] is a 68 y.o. 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  He moved to [facility] in 2020 and establish care with our office in the setting of hospitalization in 07/2019 for CHF.  Echo at that time showed an EF of 25 to 30%, global hypokinesis, moderately increased LVH, grade 2 diastolic dysfunction, moderately reduced RV systolic function with mildly large ventricular cavity size, moderately elevated RVSP of 52.8 mmHg, moderately dilated right atrium, and mild to moderate tricuspid regurgitation.  He was also found to have a moderate-sized right layering pleural effusion and underwent thoracentesis.  He was placed on GDMT.  Follow-up echo in 12/2019 showed an EF of 50 to 55%, no regional wall motion abnormalities, moderate concentric LVH, grade 2 diastolic dysfunction, normal RV systolic function, ventricular cavity size, and RVSP, and a normal CVP.  He has historically been followed by the [facility] CHF clinic, last seeing general cardiology in 12/2019.  He was readmitted to the hospital in 05/2020 with syncope.  Echo at that time showed an EF of 45 to 50%, global hypokinesis, severe LVH, grade 2 diastolic dysfunction RV systolic function and ventricular cavity size, mild biatrial enlargement, aortic valve sclerosis without evidence of stenosis, and mild dilatation of the aortic root measuring 39 mm.  He  was admitted to the hospital in 06/2023 with new onset atrial flutter with RVR and CHF exacerbation.  Echo during the admission showed an EF of 20 to 25%, moderate LVH, indeterminate LV diastolic function parameters, moderately reduced RV systolic function with normal ventricular cavity size, RVSP 35.9 mmHg, moderate biatrial enlargement, moderate mitral regurgitation, moderate to severe tricuspid regurgitation, dilated pulmonary artery, and an estimated right atrial pressure of 3 mmHg.  He was managed by the heart failure service and underwent successful TEE guided DCCV.  Cardiac MRI in 06/2023 showed an EF of 26%, thin mid wall basal septal LGE, severely reduced RV function, no significant valvular abnormalities, and no evidence of myocardial infiltrative disease.'s were consistent with dilated nonischemic apathy.  He was admitted to the hospital in 03/2024 with CHF exacerbation in the setting of accelerated hypertension and iron  deficiency anemia requiring infusion.  He was diuresed by the hospitalist service.  He was most recently admitted to the hospital in 06/2024 with acute on chronic HFrEF in the setting of running out of meds for 1 week prior to admission.  Symptomatic improvement with diuresis.  Echo during admission showed an EF of 20 to 25%, normal hypokinesis, moderate asymmetric LVH of the septal segment, grade 2 diastolic dysfunction, moderately reduced RV systolic function with normal ventricular cavity size and severely elevated RVSP estimated at 73.7 mmHg, severe biatrial enlargement, functional moderate tricuspid regurgitation due to annular dilatation, trivial aortic insufficiency, and an estimated right atrial pressure of 15 mmHg.   He was seen by the [facility] CHF clinic on 07/26/2024 with follow-up labs at that time showing an improving proBNP  as outlined below as well as AKI with a serum creatinine 1.33.  They have recommended he only take torsemide  20 mg Monday through  Friday.  ***   Labs independently reviewed: 07/2024 - proBNP 948 (previously 11,526 in 06/2024), potassium 4.8, BUN 31, serum creatinine 1.33, Hgb 12.2, PLT 202 albumin 3.3, AST normal, ALT 84 magnesium  1.9, high-sensitivity troponin 32 with a delta troponin 34 06/2023 - A1c 6.0, TSH normal 09/2020 - TC 193, TG 88, HDL 46, LDL 131  Past Medical History:  Diagnosis Date   CHF (congestive heart failure) (HCC)    COPD (chronic obstructive pulmonary disease) (HCC)    Diabetes mellitus without complication (HCC)    HFrEF (heart failure with reduced ejection fraction) (HCC)    a. 2019 reported neg stress test and nl cors on cath in [facility]; b. 07/2019 Echo: EF 25-30%, Gr2 DD, glob HK. Mod reduced RV fxn w/ volume overload. Mod dil RA. Mild to mod TR. Mod elev PASP; b. 12/2019 Echo: EF 50-55%; c. 05/2020 Echo: EF 45-50%, glob HK, sev LVH, GrII DD, nl RV size/fxn, mildly BAE, mild AoV sclerosis, mildly dilated Ao root.   Hypertension    NICM (nonischemic cardiomyopathy) (HCC)    Noncompliance    Pleural effusion on right    a. 07/2019 Thoracentesis: 300 ml.   Tobacco abuse     Past Surgical History:  Procedure Laterality Date   CARDIOVERSION N/A 07/17/2023   Procedure: CARDIOVERSION;  Surgeon: Gardenia Led, DO;  Location: [facility] ORS;  Service: Cardiovascular;  Laterality: N/A;   TEE WITHOUT CARDIOVERSION N/A 07/17/2023   Procedure: TRANSESOPHAGEAL ECHOCARDIOGRAM (TEE);  Surgeon: Gardenia Led, DO;  Location: [facility] ORS;  Service: Cardiovascular;  Laterality: N/A;    Current Medications: No outpatient medications have been marked as taking for the 07/28/24 encounter (Appointment) with Abigail Bernardino HERO, PA-C.    Allergies:   Patient has no known allergies.   Social History   Socioeconomic History   Marital status: Single    Spouse name: Not on file   Number of children: 2   Years of education: Not on file   Highest education level: High school graduate  Occupational History    Occupation: unemployed  Tobacco Use   Smoking status: Former    Current packs/day: 0.50    Average packs/day: 0.3 packs/day for 25.9 years (8.0 ttl pk-yrs)    Types: Cigarettes    Start date: 2020   Smokeless tobacco: Never   Tobacco comments:    Stopped 2 years ago. 2023  Vaping Use   Vaping status: Never Used  Substance and Sexual Activity   Alcohol use: Never   Drug use: Not Currently   Sexual activity: Not Currently  Other Topics Concern   Not on file  Social History Narrative   Patient moved to [place]  from [place]  in March 2020, to take care of his Mother. She lives in a community managed by her church, independent living for seniors. Patient lives with his Mother, does not drive, and reports he mostly keeps to himself.   Social Drivers of Health   Financial Resource Strain: High Risk (04/05/2024)   Overall Financial Resource Strain (CARDIA)    Difficulty of Paying Living Expenses: Hard  Food Insecurity: No Food Insecurity (07/11/2024)   Hunger Vital Sign    Worried About Running Out of Food in the Last Year: Never true    Ran Out of Food in the Last Year: Never true  Transportation Needs: Unmet Transportation Needs (07/26/2024)  PRAPARE - Administrator, Civil Service (Medical): Yes    Lack of Transportation (Non-Medical): Yes  Physical Activity: Insufficiently Active (01/20/2020)   Exercise Vital Sign    Days of Exercise per Week: 2 days    Minutes of Exercise per Session: 30 min  Stress: No Stress Concern Present (08/04/2019)   [employer] of Occupational Health - Occupational Stress Questionnaire    Feeling of Stress : Not at all  Social Connections: Moderately Integrated (07/11/2024)   Social Connection and Isolation Panel    Frequency of Communication with Friends and Family: More than three times a week    Frequency of Social Gatherings with Friends and Family: Once a week    Attends Religious Services: 1 to 4 times per year     Active Member of [employer] or Organizations: No    Attends Engineer, Structural: 1 to 4 times per year    Marital Status: Never married     Family History:  The patient's family history is not on file.  ROS:   12-point review of systems is negative unless otherwise noted in the HPI.   EKGs/Labs/Other Studies Reviewed:    Studies reviewed were summarized above. The additional studies were reviewed today:  2D echo 07/10/2024: 1. Left ventricular ejection fraction, by estimation, is 20 to 25%. Left  ventricular ejection fraction by 3D volume is 22 %. The left ventricle has  severely decreased function. The left ventricle demonstrates global  hypokinesis. There is moderate  asymmetric left ventricular hypertrophy of the septal segment. Left  ventricular diastolic parameters are consistent with Grade II diastolic  dysfunction (pseudonormalization). Elevated left ventricular end-diastolic  pressure. The average left ventricular  global longitudinal strain is -6.4 %. The global longitudinal strain is  abnormal.   2. Right ventricular systolic function is moderately reduced. The right  ventricular size is normal. There is severely elevated pulmonary artery  systolic pressure.   3. Left atrial size was severely dilated.   4. Right atrial size was severely dilated.   5. The mitral valve is normal in structure. No evidence of mitral valve  regurgitation. No evidence of mitral stenosis.   6. Functional tricuspid regurgitation due to annular dilitation.  Tricuspid valve regurgitation is moderate.   7. The aortic valve is tricuspid. Aortic valve regurgitation is trivial.  No aortic stenosis is present.   8. The inferior vena cava is dilated in size with <50% respiratory  variability, suggesting right atrial pressure of 15 mmHg.  __________  Cardiac MRI 07/18/2023: IMPRESSION: 1. Mildly dilated LV size, severely reduced LV systolic function. LVEF 26%. 2. There is a thin, midwall,  basal septal LGE. 3. Severely reduced RV function. 4. No significant valvular abnormalities. 5. No evidence for myocardial infiltrative disease. 6. Findings consistent with dilated nonischemic cardiomyopathy. __________  2D echo 07/12/2023: 1. Left ventricular ejection fraction, by estimation, is 20 to 25%. Left  ventricular ejection fraction by 3D volume is 20 %. The left ventricle has  severely decreased function. The left ventricle has no regional wall  motion abnormalities. There is  moderate left ventricular hypertrophy. Left ventricular diastolic  parameters are indeterminate. The average left ventricular global  longitudinal strain is -6.3 %.   2. Right ventricular systolic function is moderately reduced. The right  ventricular size is normal. There is normal pulmonary artery systolic  pressure. The estimated right ventricular systolic pressure is 35.9 mmHg.   3. Left atrial size was moderately dilated.   4. Right  atrial size was moderately dilated.   5. The mitral valve is normal in structure. Mild mitral valve  regurgitation. No evidence of mitral stenosis.   6. Tricuspid valve regurgitation is moderate to severe.   7. The aortic valve is tricuspid. Aortic valve regurgitation is not  visualized. No aortic stenosis is present.   8. Severely dilated pulmonary artery.   9. The inferior vena cava is normal in size with greater than 50%  respiratory variability, suggesting right atrial pressure of 3 mmHg.  __________  Limited echo 06/12/2020: 1. Left ventricular ejection fraction, by estimation, is 45 to 50%. The  left ventricle has mildly decreased function. The left ventricle  demonstrates global hypokinesis. There is severe left ventricular  hypertrophy. Left ventricular diastolic parameters   are consistent with Grade II diastolic dysfunction (pseudonormalization).   2. Right ventricular systolic function is normal. The right ventricular  size is normal. Tricuspid  regurgitation signal is inadequate for assessing  PA pressure.   3. Left atrial size was mildly dilated.   4. Right atrial size was mildly dilated.   5. The mitral valve is normal in structure. No evidence of mitral valve  regurgitation. No evidence of mitral stenosis.   6. The aortic valve is normal in structure. Aortic valve regurgitation is  not visualized. Mild aortic valve sclerosis is present, with no evidence  of aortic valve stenosis.   7. Aortic dilatation noted. There is mild dilatation of the aortic root,  measuring 39 mm.  __________  2D echo 01/13/2020: 1. Left ventricular ejection fraction, by estimation, is 50 to 55%. The  left ventricle has low normal function. The left ventricle has no regional  wall motion abnormalities. There is moderate concentric left ventricular  hypertrophy. Left ventricular  diastolic parameters are consistent with Grade II diastolic dysfunction  (pseudonormalization).   2. Right ventricular systolic function is normal. The right ventricular  size is normal. There is normal pulmonary artery systolic pressure.   3. The mitral valve is normal in structure. No evidence of mitral valve  regurgitation.   4. The aortic valve is normal in structure. Aortic valve regurgitation is  not visualized.   5. The inferior vena cava is normal in size with greater than 50%  respiratory variability, suggesting right atrial pressure of 3 mmHg.  __________  2D echo 08/04/2019: 1. Left ventricular ejection fraction, by visual estimation, is 25 to  30%. The left ventricle has severely decreased function. There is  moderately increased left ventricular hypertrophy.   2. Left ventricular diastolic parameters are consistent with Grade II  diastolic dysfunction (pseudonormalization).   3. Right ventricular volume overload.   4. The left ventricle demonstrates global hypokinesis.   5. Global right ventricle has moderately reduced systolic function.The  right  ventricular size is mildly enlarged. Right vetricular wall thickness  was not assessed.   6. Left atrial size was normal.   7. Right atrial size was moderately dilated.   8. The mitral valve is normal in structure. No evidence of mitral valve  regurgitation.   9. The tricuspid valve is normal in structure. Tricuspid valve  regurgitation mild-moderate.  10. The aortic valve is tricuspid. Aortic valve regurgitation is not  visualized.  11. The pulmonic valve was not well visualized. Pulmonic valve  regurgitation is not visualized.  12. Moderately elevated pulmonary artery systolic pressure.  13. The inferior vena cava is dilated in size with <50% respiratory  variability, suggesting right atrial pressure of 15 mmHg.  EKG:  EKG is ordered today.  The EKG ordered today demonstrates ***  Recent Labs: 04/14/2024: B Natriuretic Peptide 1,275.4 07/10/2024: Magnesium  1.9 07/12/2024: ALT 84; Hemoglobin 12.2; Platelets 202 07/26/2024: BUN 31; Creatinine, Ser 1.33; Potassium 4.8; Pro Brain Natriuretic Peptide 948.0; Sodium 141  Recent Lipid Panel    Component Value Date/Time   CHOL 193 10/04/2020 1251   TRIG 88 10/04/2020 1251   HDL 46 10/04/2020 1251   CHOLHDL 4.2 10/04/2020 1251   CHOLHDL 3.8 04/27/2020 1442   VLDL 16 04/27/2020 1442   LDLCALC 131 (H) 10/04/2020 1251    PHYSICAL EXAM:    VS:  There were no vitals taken for this visit.  BMI: There is no height or weight on file to calculate BMI.  Physical Exam  Wt Readings from Last 3 Encounters:  07/26/24 177 lb 9.6 oz (80.6 kg)  07/13/24 175 lb 11.3 oz (79.7 kg)  06/21/24 181 lb (82.1 kg)     ASSESSMENT & PLAN:   Dilated biventricular failure secondary to presumed NICM and pulmonary hypertension: ***.  Despite restoration of sinus rhythm with all available EKGs since undergoing cardioversion in 06/2023, showing the patient is maintaining sinus rhythm, his cardiomyopathy persists.  Prior cardiac MRI showed no evidence of  myocardial infiltrative disease.  During admission last month for CHF exacerbation, he was found to have mildly elevated and flat trending high-sensitivity troponin with a trend of 32-34.    Atrial flutter: ***.  CHA2DS2-VASc at least 3 (CHF, HTN, age x 1).  HTN: Blood pressure  AKI:  Transaminitis: During admission in 06/2024 for CHF exacerbation, ALT elevated at 180 improving to 84 prior to discharge following diuresis.  Possibly in the setting of congestive hepatopathy at that time.  ***  Medication management:   {Are you ordering a CV Procedure (e.g. stress test, cath, DCCV, TEE, etc)?   Press F2        :[id number]}     Disposition: F/u with Dr. Darliss or an APP in ***.   Medication Adjustments/Labs and Tests Ordered: Current medicines are reviewed at length with the patient today.  Concerns regarding medicines are outlined above. Medication changes, Labs and Tests ordered today are summarized above and listed in the Patient Instructions accessible in Encounters.   Signed, Bernardino Bring, PA-C 07/27/2024 10:30 AM     [facility] [place] [address] [place], [place] [address] [phone]

## 2024-07-28 ENCOUNTER — Ambulatory Visit: Admitting: Physician Assistant

## 2024-08-02 NOTE — Progress Notes (Unsigned)
 A user error has taken place: encounter opened in error, closed for administrative reasons.

## 2024-08-05 ENCOUNTER — Encounter: Payer: Self-pay | Admitting: Cardiology

## 2024-08-05 ENCOUNTER — Ambulatory Visit: Attending: Physician Assistant | Admitting: Cardiology

## 2024-08-05 VITALS — BP 144/90 | HR 53 | Ht 68.0 in | Wt 182.2 lb

## 2024-08-05 DIAGNOSIS — I480 Paroxysmal atrial fibrillation: Secondary | ICD-10-CM

## 2024-08-05 DIAGNOSIS — E785 Hyperlipidemia, unspecified: Secondary | ICD-10-CM

## 2024-08-05 DIAGNOSIS — E1169 Type 2 diabetes mellitus with other specified complication: Secondary | ICD-10-CM | POA: Diagnosis not present

## 2024-08-05 DIAGNOSIS — I5040 Unspecified combined systolic (congestive) and diastolic (congestive) heart failure: Secondary | ICD-10-CM

## 2024-08-05 DIAGNOSIS — I428 Other cardiomyopathies: Secondary | ICD-10-CM | POA: Diagnosis not present

## 2024-08-05 DIAGNOSIS — I447 Left bundle-branch block, unspecified: Secondary | ICD-10-CM

## 2024-08-05 DIAGNOSIS — Z91148 Patient's other noncompliance with medication regimen for other reason: Secondary | ICD-10-CM | POA: Diagnosis not present

## 2024-08-05 DIAGNOSIS — I1A0 Resistant hypertension: Secondary | ICD-10-CM

## 2024-08-05 MED ORDER — CARVEDILOL 25 MG PO TABS: 25.0000 mg | ORAL_TABLET | Freq: Two times a day (BID) | ORAL | 3 refills | Status: AC

## 2024-08-05 MED ORDER — SPIRONOLACTONE 25 MG PO TABS: 25.0000 mg | ORAL_TABLET | Freq: Every day | ORAL | 3 refills | Status: DC

## 2024-08-05 MED ORDER — TORSEMIDE 20 MG PO TABS: 20.0000 mg | ORAL_TABLET | Freq: Every day | ORAL | 3 refills | Status: AC

## 2024-08-05 MED ORDER — SACUBITRIL-VALSARTAN 97-103 MG PO TABS: 1.0000 | ORAL_TABLET | Freq: Two times a day (BID) | ORAL | 3 refills | Status: AC

## 2024-08-05 MED ORDER — ATORVASTATIN CALCIUM 20 MG PO TABS: 20.0000 mg | ORAL_TABLET | Freq: Every day | ORAL | 3 refills | Status: AC

## 2024-08-05 MED ORDER — APIXABAN 5 MG PO TABS: 5.0000 mg | ORAL_TABLET | Freq: Two times a day (BID) | ORAL | 3 refills | Status: AC

## 2024-08-05 MED ORDER — DAPAGLIFLOZIN PROPANEDIOL 10 MG PO TABS: 10.0000 mg | ORAL_TABLET | Freq: Every day | ORAL | 3 refills | Status: AC

## 2024-08-05 NOTE — Progress Notes (Signed)
 Cardiology Office Note:  .   Date:  08/05/2024  ID:  Kevin [name], DOB December 09, 1955, MRN [id number] PCP: Freddrick Johns  [facility] Providers Cardiologist:  Redell Cave, MD     Chief Complaint  Patient presents with   New Patient (Initial Visit)    CHF/HTN no complaints today. Pt requesting refill Silver  cream no pcp currently. Meds reviewed verbally with pt.   Cardiomyopathy   Congestive Heart Failure    Needs medications refilled.  Was not taking  Entresto  as indicated.    Patient Profile: .     Kevin [name] is a 68 y.o. male former light smoker with a PMH noted below who presents here for reestablishing cardiology care at the request of No ref. provider found.  PMH: NICM (thought to be related to HTN)-usually NYHA Class II Chronic Combined Heart Failure PAF (06/2023) COPD ~Iron -deficiency anemia Gout    Most Recent Hospitalizations: July 10, 2023: Admitted for acute CHF exacerbation with A-fib => TEE DCCV April 03, 2024: Admitted with CHF.  Intermittent SVT.  Possible cellulitis.  Treated with Venofer  300 mg ER 04/20/2024: Bilateral foot wounds ER 06/18/2024: Again bilateral foot pain and swelling Admitted 07/09/2024: Out of medication for 1 week diuresed.  Cardiology curb sided.  EF again was 2025%.  Abnormal LFTs thought to be related to liver congestion.  Ketrick Matney was last seen on July 26, 2024 by Ellouise Class, FNP most complaining of fatigue.  Still having pedal edema.  Left foot still sore from healing wound.  The plan had been to titrate up his Entresto  to a full 97/103 mg twice daily (he was only taking the 49/51 mg twice daily.  Denied any chest pain or significant dyspnea.  Felt to be euvolemic.  Referred back to cardiology-previously seen by Dr. Cave.  However somehow he is on my schedule.  Subjective  Discussed the use of AI scribe software for clinical note transcription with the patient, who gave verbal consent to proceed.  History of  Present Illness Kevin [name] is a 68 year old male with heart failure who presents for medication management and follow-up.  He has a history of heart failure and atrial fibrillation. His blood thinner was stopped during incarceration earlier this year, around February or March, and he is unsure of the reason. He has been taking Eliquis  twice daily but did not have it refilled while in jail.  He is currently on Entresto  49/51 mg, carvedilol  25 mg BID, atorvastatin  20 mg, spironolactone  25 mg, torsemide , Farxiga  10 mg, tamsulosin , and Eliquis . His spironolactone  and atorvastatin  bottles are empty. He continues with the lower dose of Entresto .  He smokes two cigarettes a day and is attempting to reduce smoking. He experiences difficulty walking due to gout, primarily affecting his left leg, foot, and heel, and is using an antibiotic cream for a sore on his left foot.  He sleeps in a reclining bed that allows him to elevate his feet and occasionally wakes up at night needing to sit up, though not due to breathing difficulties. He monitors his weight daily and manages fluid retention with torsemide  if his weight increases significantly.  No heart racing, irregular heartbeats, dizziness, lightheadedness, chest pain, or stroke symptoms. No recent episodes of shortness of breath, with the last occurrence over two weeks ago. Swelling has decreased, though soreness persists.  His last blood work showed improved BNP and hemoglobin levels, and stable kidney function. Blood pressure readings differ between arms, with the left being higher than  the right.   ROS:  Cardiovascular ROS: no chest pain or dyspnea on exertion negative for - edema, irregular heartbeat, loss of consciousness, orthopnea, palpitations, paroxysmal nocturnal dyspnea, rapid heart rate, shortness of breath, or lightheadedness, dizziness, weakness or syncope/near syncope or TIA/amaurosis fugax, claudication.  Heavy bleeding, melena,  hematochezia, hematuria; epistaxis. No notable weight gain.  Major issue now is he still dealing with left foot gout pain.   Objective    Meds per Last Cardiology Note: Carvedilol  25 mg twice daily; increased Entresto  to 97/103 mg twice daily (but he is actually only taking the 49/51 mg tablets twice daily despite the prescription indicating take 2 tabs twice daily), spironolactone  25 mg daily, Farxiga  10 mg daily, torsemide  20 mg daily; Eliquis  5 mg twice daily Atorvastatin  20 mg daily; polysaccharide iron  100 mg daily; vitamin C  500 mg daily Tamsulosin  0.4 mg daily As needed albuterol   Have been in the [place] [facility] from March through August 2025  Social History - Tobacco: Current smoker, 0.1 ppd. Smokes two cigarettes a day. - The patient was incarcerated earlier this year, which affected his medication regimen. He also mentions financial difficulties in paying for his medications.   Studies Reviewed: SABRA    Summary of Cardiac Studies: TTE (08/04/2019): Severely reduced EF 25 to 30% with global HK.  Moderately increased LVH.  GR 2 DD.  RV volume overload with moderately reduced RV function.  Moderate RA dilation with elevated RAP estimated 15 mmHg. TTE (01/13/2020): EF improved to 50 and 55%.  No RWMA.  GR 2 DD.  Normal PAP and RAP.  Normal valves. TTE (06/12/2020): EF 45 to 50%.  Global HK.  GR 2 DD.  Mild biatrial enlargement.  Suggestion of mild aortic sclerosis.  Otherwise normal valves. TTE (07/12/2023): EF 20 to 25%.  Severely decreased function.  Moderate LVH.  Global HK.  Also moderately reduced RV function with borderline elevated RVSP.  Moderate biatrial enlargement.  Moderate to severe TR.  Mild MR.  Severely dilated PA but normal RAP. TEE (07/17/2023): EF 20 to 25%.  Severe LVH.  Severely reduced RV function.  Mild TR.  No LAA thrombus. => Cardioverted  Cardiac Studies & Procedures    ______________________________________________________________________________________________  ECHOCARDIOGRAM  ECHOCARDIOGRAM COMPLETE (07/10/2024):  1. Left ventricular ejection fraction, by estimation, is 20 to 25%. Left ventricular ejection fraction by 3D volume is 22 %. The left ventricle has severely decreased function. The left ventricle demonstrates global hypokinesis. There is moderate asymmetric left ventricular hypertrophy of the septal segment. Left ventricular diastolic parameters are consistent with Grade II diastolic dysfunction (pseudonormalization). Elevated left ventricular end-diastolic pressure. The average left ventricular global longitudinal strain is -6.4 %. The global longitudinal strain is abnormal. 2. Right ventricular systolic function is moderately reduced. The right ventricular size is normal. There is severely elevated pulmonary artery systolic pressure. 3. Left atrial size was severely dilated. 4. Right atrial size was severely dilated. 5. The mitral valve is normal in structure. No evidence of mitral valve regurgitation. No evidence of mitral stenosis. 6. Functional tricuspid regurgitation due to annular dilitation. Tricuspid valve regurgitation is moderate. 7. The aortic valve is tricuspid. Aortic valve regurgitation is trivial. No aortic stenosis is present. 8. The inferior vena cava is dilated in size with <50% respiratory variability, suggesting right atrial pressure of 15 mmHg.  CARDIAC MRI  MR CARDIAC MORPHOLOGY W WO CONTRAST (07/18/2023):  Findings consistent with dilated nonischemic cardiomyopathy biventricular failure with LVEF of 26% and RVEF of 28%. IMPRESSION: 1. Mildly  dilated LV size, severely reduced LV systolic function. LVEF 26%. 2. There is a thin, midwall, basal septal LGE.  3. Severely reduced RV function. 4. No significant valvular abnormalities.  5. No evidence for myocardial infiltrative disease. 1. Mildly dilated left ventricular size,  normal thickness, severely reduced systolic function (LVEF = 26%). There is global hypokinesis. There is a thin, midwall, basal septal LGE.  2. Normal right ventricular size and thickness. Severely reduced systolic function (RVEF = 28%). There are no regional wall motionabnormalities.  3.  Mildly dilated left and right atrial size.  4. Normal size of the aortic root, ascending aorta and pulmonary artery.  5. Trivial mitral regurgitation, no significant valvular abnormalities.  6.  Normal pericardium.  No pericardial effusion. ______________________________________________________________________________________________     Lab Results  Component Value Date   NA 141 07/26/2024   K 4.8 07/26/2024   CREATININE 1.33 (H) 07/26/2024   GFRNONAA 59 (L) 07/26/2024   GLUCOSE 143 (H) 07/26/2024  BNP: 07/09/2024: 11,526; 07/26/2024: 948         Component Ref Range & Units (hover) 4 mo ago (04/03/24) 1 yr ago (07/14/23) 1 yr ago (07/10/23) 4 yr ago (06/12/20) 5 yr ago (08/03/19) 5 yr ago (07/29/19)  B Natriuretic Peptide 3,957.9 High  1,517.1 High  CM 2,153.9 High  CM 620.0 High  CM 2,085.0 High  CM 2,085.0 High  CM   Lab Results  Component Value Date   CHOL 193 10/04/2020   HDL 46 10/04/2020   LDLCALC 131 (H) 10/04/2020   TRIG 88 10/04/2020   CHOLHDL 4.2 10/04/2020   Lab Results  Component Value Date   HGBA1C 6.0 (H) 07/14/2023      Latest Ref Rng & Units 07/12/2024    1:41 PM 07/10/2024    3:43 AM 07/09/2024    3:22 PM  CBC  WBC 4.0 - 10.5 K/uL 7.7  7.0  6.6   Hemoglobin 13.0 - 17.0 g/dL 87.7  88.8  88.4   Hematocrit 39.0 - 52.0 % 36.8  33.8  35.5   Platelets 150 - 400 K/uL 202  165  169      Risk Assessment/Calculations:    CHA2DS2-VASc Score = 3   This indicates a 3.2% annual risk of stroke. The patient's score is based upon: CHF History: 1 HTN History: 1 Diabetes History: 0 Stroke History: 0 Vascular Disease History: 0 Age Score: 1 Gender Score: 0    HYPERTENSION  CONTROL Vitals:   08/05/24 1011 08/05/24 1019  BP: 129/81 (!) 144/90    The patient's blood pressure is elevated above target today.  In order to address the patient's elevated BP: A current anti-hypertensive medication was adjusted today. (I think he has been out of spironolactone , and has not been taking the full dose of Entresto  we will increase Entresto  to 97/103 mg.)         Physical Exam:   VS:  BP (!) 144/90 (BP Location: Left Arm, Cuff Size: Normal)   Pulse (!) 53   Ht 5' 8 (1.727 m)   Wt 182 lb 4 oz (82.7 kg)   SpO2 98%   BMI 27.71 kg/m    Wt Readings from Last 3 Encounters:  08/05/24 182 lb 4 oz (82.7 kg)  07/26/24 177 lb 9.6 oz (80.6 kg)  07/13/24 175 lb 11.3 oz (79.7 kg)    GEN: Well nourished, well groomed; in no acute distress NECK: No JVD; No carotid bruits CARDIAC: RRR with ectopy., Normal S1, split S2 and  cannot exclude S4 gallop; no murmurs, rubs, RESPIRATORY:  Clear to auscultation without rales, wheezing or rhonchi ; nonlabored, good air movement. ABDOMEN: Soft, non-tender, non-distended EXTREMITIES:  No edema; No deformity ; walks with a antalgic gait favoring the left foot (recent hospitalizations for foot cellulitis)    ASSESSMENT AND PLAN: .    Problem List Items Addressed This Visit       Cardiology Problems   Heart failure with reduced ejection fraction (HFrEF, <= 40%) and combined systolic and diastolic dysfunction (HCC) - Primary (Chronic)   Chronic HFrEF managed with medications.  Euvolemic.  NYHA Class II symptoms.  Most recent BMP was dramatically reduced compared to previous. GDMT pillars: On max dose carvedilol , and increasing to max dose Entresto , on spironolactone  and Farxiga .  On standing dose torsemide  with PRN dosing based on weight change and symptoms.  Recent Entresto  dose increase-but not compliant with. Elevated blood pressure likely due to spironolactone  lapse. Weight management critical to prevent fluid overload and  hospitalization. No recent heart failure hospitalizations.  - Refilled Entresto , instructed two 49/51 mg tablets twice daily until current bottle finished, then switch to new prescription Entresto  97/103 mg twice daily. - Refilled carvedilol  25 mg BID. - Refilled spironolactone  25 mg & - Farxiga  10 mg daily. - Refilled torsemide , instructed extra dose if weight increases by >3 pounds/day or >5 pounds/week. - Educated on signs of fluid overload and extra torsemide  use. - Instructed to monitor weight daily and maintain log.  Sliding scale Torsemide : Weigh yourself when you get home, then Daily in the Morning. Your dry weight will be what your scale says on the day you return home.(here is 180 Lbs - but use YOUR SCALE WEIGHT.).  If you gain more than 3 Lbs from dry weight in 1 day or 5 Lbs in 7 days: Increase the TORSEMIDE  dosing to 40 mg (2 TABS) PER DAY  weight returns to baseline dry weight. If weight gain is greater than 5 pounds in 2 days: Increase to TORSEMIDE  2 TABS 2 X PER DAY and contact the office for further assistance if weight does not go down the next day.       Relevant Medications   carvedilol  (COREG ) 25 MG tablet   sacubitril -valsartan  (ENTRESTO ) 97-103 MG   spironolactone  (ALDACTONE ) 25 MG tablet   torsemide  (DEMADEX ) 20 MG tablet   atorvastatin  (LIPITOR) 20 MG tablet   apixaban  (ELIQUIS ) 5 MG TABS tablet   Other Relevant Orders   EKG 12-Lead (Completed)   Hyperlipidemia associated with type 2 diabetes mellitus (HCC)   Relevant Medications   carvedilol  (COREG ) 25 MG tablet   dapagliflozin  propanediol (FARXIGA ) 10 MG TABS tablet   sacubitril -valsartan  (ENTRESTO ) 97-103 MG   spironolactone  (ALDACTONE ) 25 MG tablet   torsemide  (DEMADEX ) 20 MG tablet   atorvastatin  (LIPITOR) 20 MG tablet   apixaban  (ELIQUIS ) 5 MG TABS tablet   Other Relevant Orders   EKG 12-Lead (Completed)   Nonischemic cardiomyopathy (HCC) (Chronic)   Has been documented to have a nonischemic  cardiomyopathy with current echocardiogram in November 2025 showing findings consistent with reduced EF back in November 2024.  Has been titrated up to almost full GDMT.  However now EKG shows more well-defined LBBB pattern and with EF of the 25% range questions and potential benefit for ICD.  Will place referral to EP to assess for appropriateness of ICD (potentially CRT-D) placement.      Relevant Medications   carvedilol  (COREG ) 25 MG tablet   sacubitril -valsartan  (ENTRESTO ) 97-103  MG   spironolactone  (ALDACTONE ) 25 MG tablet   torsemide  (DEMADEX ) 20 MG tablet   atorvastatin  (LIPITOR) 20 MG tablet   apixaban  (ELIQUIS ) 5 MG TABS tablet   PAF (paroxysmal atrial fibrillation) (HCC) (Chronic)   Previous hospitalization for atrial fibrillation during which he underwent TEE cardioversion (November 2024).  On Eliquis  to prevent stroke and allow cardioversion. No current symptoms. Emphasized consistent Eliquis  use to prevent stroke and enable cardioversion. - Continue Eliquis  5 mg twice daily, ensure adherence to prevent stroke and allow cardioversion if atrial fibrillation occurs.  - Refilled Eliquis , emphasized adherence to prevent stroke and allow cardioversion if atrial fibrillation occurs. - On high dose carvedilol  25 mg twice daily for rate control - Educated on signs of atrial fibrillation and when to seek emergency care.=> Indicated that he would not tolerate being in A-fib for prolonged period time.  If he has significant change in symptoms he should monitor his heart rate and if elevated should seek urgent care.      Relevant Medications   carvedilol  (COREG ) 25 MG tablet   sacubitril -valsartan  (ENTRESTO ) 97-103 MG   spironolactone  (ALDACTONE ) 25 MG tablet   torsemide  (DEMADEX ) 20 MG tablet   atorvastatin  (LIPITOR) 20 MG tablet   apixaban  (ELIQUIS ) 5 MG TABS tablet   Other Relevant Orders   EKG 12-Lead (Completed)   Resistant hypertension (Chronic)   Hypertension likely worsened by  spironolactone  lapse. Blood pressure management crucial to prevent heart failure and atrial fibrillation complications. - Refilled spironolactone  25 mg. - Refilled carvedilol  25 mg BID. - Refilled Entresto  with higher dose 97/103 mg twice daily. - Monitor blood pressure regularly.      Relevant Medications   carvedilol  (COREG ) 25 MG tablet   sacubitril -valsartan  (ENTRESTO ) 97-103 MG   spironolactone  (ALDACTONE ) 25 MG tablet   torsemide  (DEMADEX ) 20 MG tablet   atorvastatin  (LIPITOR) 20 MG tablet   apixaban  (ELIQUIS ) 5 MG TABS tablet   Other Relevant Orders   EKG 12-Lead (Completed)     Other   Dyslipidemia (Chronic)   He is due for lipid check.  He is on atorvastatin  20 mg daily  With next heart failure clinic visit if labs are checked, would recommend also checking lipid panel and LFTs.      Relevant Medications   atorvastatin  (LIPITOR) 20 MG tablet   Noncompliance with medication regimen   Counseled on the importance of taking medications as prescribed.  We talked about the importance of not missing any dose of Eliquis  which would allow us  to then cardiovert him worried to go back in A-fib.  Stressed the importance of maintaining with adequate blood pressure control and taking the Entresto  the correct way.  Also discussed sliding scale Lasix  to avoid rebound hospitalizations.      Other Visit Diagnoses       Left bundle branch block       Relevant Medications   carvedilol  (COREG ) 25 MG tablet   sacubitril -valsartan  (ENTRESTO ) 97-103 MG   spironolactone  (ALDACTONE ) 25 MG tablet   torsemide  (DEMADEX ) 20 MG tablet   atorvastatin  (LIPITOR) 20 MG tablet   apixaban  (ELIQUIS ) 5 MG TABS tablet   Other Relevant Orders   Ambulatory referral to Cardiac Electrophysiology            Follow-Up: Return in about 6 months (around 02/03/2025).   I spent 76 minutes in the care of Kaizen Ibsen today including reviewing labs (2 minutes), reviewing studies (reviewed the progression of  several echocardiograms with most recent echocardiogram images  personally reviewed, cardiac MRI reviewed-13 minutes), face to face time discussing treatment options (32 minutes), reviewing records from previous hospitalization reports, most recent heart failure clinic visit (18 minutes), 11 minutes dictating, and documenting in the encounter.      Signed,  Alm MICAEL Clay, MD, MS Alm Clay, M.D., M.S. Interventional Cardiologist  [facility]   [address] [place], [place]  [address] [phone]           Fax [phone]

## 2024-08-05 NOTE — Patient Instructions (Addendum)
 Medication Instructions:   Your physician recommends that you continue on your current medications as directed. Please refer to the Current Medication list given to you today.    Prescriptions for all of your heart medications have been sent to the pharmacy of your choice, [facility] pharmacy on [address]).  All are for 3 month supply with 3 refills, so they should last to for approximately full year.  Additional instructions for taking Torsemide  (Demadex ):  Sliding scale Torsemide : Weigh yourself when you get home, then Daily in the Morning. Your dry weight will be what your scale says on the day you return home.(here is 180 Lbs - but use YOUR SCALE WEIGHT.).  If you gain more than 3 Lbs from dry weight in 1 day or 5 Lbs in 7 days: Increase the TORSEMIDE  dosing to 40 mg (2 TABS) PER DAY  weight returns to baseline dry weight. If weight gain is greater than 5 pounds in 2 days: Increase to TORSEMIDE  2 TABS 2 X PER DAY and contact the office for further assistance if weight does not go down the next day.   *If you need a refill on your cardiac medications before your next appointment, please call your pharmacy*  Lab Work:  None ordered at this time   If you have labs (blood work) drawn today and your tests are completely normal, you will receive your results only by:  MyChart Message (if you have MyChart) OR  A paper copy in the mail If you have any lab test that is abnormal or we need to change your treatment, we will call you to review the results.  Testing/Procedures:  None ordered at this time   Referrals:  Your cardiologist has referred you to Electrophysiology.  We have attached their office location and phone number below.  Please allow them 3-5 business days to reach out to you to make an appointment.  If you have not heard from their office within that time, please call them to schedule your appointment.    Follow-Up:  At [facility], you and your health  needs are our priority.  As part of our continuing mission to provide you with exceptional heart care, our providers are all part of one team.  This team includes your primary Cardiologist (physician) and Advanced Practice Providers or APPs (Physician Assistants and Nurse Practitioners) who all work together to provide you with the care you need, when you need it.  Your next appointment:   6 month(s)  Provider:    Alm Clay, MD    We recommend signing up for the patient portal called MyChart.  Sign up information is provided on this After Visit Summary.  MyChart is used to connect with patients for Virtual Visits (Telemedicine).  Patients are able to view lab/test results, encounter notes, upcoming appointments, etc.  Non-urgent messages can be sent to your provider as well.   To learn more about what you can do with MyChart, go to [email or web address].   Other Instructions  Sliding scale Torsemide : Weigh yourself when you get home, then Daily in the Morning. Your dry weight will be what your scale says on the day you return home.(here is 180 Lbs - but use YOUR SCALE WEIGHT.).  If you gain more than 3 Lbs from dry weight in 1 day or 5 Lbs in 7 days: Increase the TORSEMIDE  dosing to 40 mg (2 TABS) PER DAY  weight returns to baseline dry weight. If weight gain is greater than 5 pounds  in 2 days: Increase to TORSEMIDE  2 TABS 2 X PER DAY and contact the office for further assistance if weight does not go down the next day.

## 2024-08-05 NOTE — Assessment & Plan Note (Addendum)
 Hypertension likely worsened by spironolactone  lapse. Blood pressure management crucial to prevent heart failure and atrial fibrillation complications. - Refilled spironolactone  25 mg. - Refilled carvedilol  25 mg BID. - Refilled Entresto  with higher dose 97/103 mg twice daily. - Monitor blood pressure regularly.

## 2024-08-05 NOTE — Assessment & Plan Note (Signed)
 He is due for lipid check.  He is on atorvastatin  20 mg daily  With next heart failure clinic visit if labs are checked, would recommend also checking lipid panel and LFTs.

## 2024-08-05 NOTE — Assessment & Plan Note (Addendum)
 Previous hospitalization for atrial fibrillation during which he underwent TEE cardioversion (November 2024).  On Eliquis  to prevent stroke and allow cardioversion. No current symptoms. Emphasized consistent Eliquis  use to prevent stroke and enable cardioversion. - Continue Eliquis  5 mg twice daily, ensure adherence to prevent stroke and allow cardioversion if atrial fibrillation occurs.  - Refilled Eliquis , emphasized adherence to prevent stroke and allow cardioversion if atrial fibrillation occurs. - On high dose carvedilol  25 mg twice daily for rate control - Educated on signs of atrial fibrillation and when to seek emergency care.=> Indicated that he would not tolerate being in A-fib for prolonged period time.  If he has significant change in symptoms he should monitor his heart rate and if elevated should seek urgent care.

## 2024-08-05 NOTE — Assessment & Plan Note (Signed)
 Counseled on the importance of taking medications as prescribed.  We talked about the importance of not missing any dose of Eliquis  which would allow us  to then cardiovert him worried to go back in A-fib.  Stressed the importance of maintaining with adequate blood pressure control and taking the Entresto  the correct way.  Also discussed sliding scale Lasix  to avoid rebound hospitalizations.

## 2024-08-05 NOTE — Assessment & Plan Note (Addendum)
 Has been documented to have a nonischemic cardiomyopathy with current echocardiogram in November 2025 showing findings consistent with reduced EF back in November 2024.  Has been titrated up to almost full GDMT.  However now EKG shows more well-defined LBBB pattern and with EF of the 25% range questions and potential benefit for ICD.  Will place referral to EP to assess for appropriateness of ICD (potentially CRT-D) placement.

## 2024-08-05 NOTE — Assessment & Plan Note (Signed)
 Chronic HFrEF managed with medications.  Euvolemic.  NYHA Class II symptoms.  Most recent BMP was dramatically reduced compared to previous. GDMT pillars: On max dose carvedilol , and increasing to max dose Entresto , on spironolactone  and Farxiga .  On standing dose torsemide  with PRN dosing based on weight change and symptoms.  Recent Entresto  dose increase-but not compliant with. Elevated blood pressure likely due to spironolactone  lapse. Weight management critical to prevent fluid overload and hospitalization. No recent heart failure hospitalizations.  - Refilled Entresto , instructed two 49/51 mg tablets twice daily until current bottle finished, then switch to new prescription Entresto  97/103 mg twice daily. - Refilled carvedilol  25 mg BID. - Refilled spironolactone  25 mg & - Farxiga  10 mg daily. - Refilled torsemide , instructed extra dose if weight increases by >3 pounds/day or >5 pounds/week. - Educated on signs of fluid overload and extra torsemide  use. - Instructed to monitor weight daily and maintain log.  Sliding scale Torsemide : Weigh yourself when you get home, then Daily in the Morning. Your dry weight will be what your scale says on the day you return home.(here is 180 Lbs - but use YOUR SCALE WEIGHT.).  If you gain more than 3 Lbs from dry weight in 1 day or 5 Lbs in 7 days: Increase the TORSEMIDE  dosing to 40 mg (2 TABS) PER DAY  weight returns to baseline dry weight. If weight gain is greater than 5 pounds in 2 days: Increase to TORSEMIDE  2 TABS 2 X PER DAY and contact the office for further assistance if weight does not go down the next day.

## 2024-08-23 ENCOUNTER — Telehealth (HOSPITAL_COMMUNITY): Payer: Self-pay | Admitting: Licensed Clinical Social Worker

## 2024-08-23 NOTE — Telephone Encounter (Signed)
 H&V Care Navigation CSW Progress Note  Clinical Social Worker consulted to assist with transportation concerns.  Pt spoke with landlord, [name], who had helped him previously- she states that she is already helping another resident that day and had referred patient to local resources to assist.  CSW called [facility] who confirms pt has been cleared to utilize transportation with them- CSW provided appt information and they confirm they can bring to app. Patient informed and ensured he had [facility] number [phone] in case he had any further questions or concerns regarding transport.  Kevin HILARIO Leech, LCSW Clinical Social Worker [facility] Desk#: [phone] Cell#: [phone]

## 2024-08-25 ENCOUNTER — Telehealth: Payer: Self-pay | Admitting: Family

## 2024-08-25 NOTE — Telephone Encounter (Signed)
 Called to confirm/remind patient of their appointment at the [facility] on 08/27/24.   Appointment:   [x] Confirmed  [] Left mess   [] No answer/No voice mail  [] VM Full/unable to leave message  [] Phone not in service  Patient reminded to bring all medications and/or complete list.  Confirmed patient has transportation. Gave directions, instructed to utilize valet parking.

## 2024-08-26 NOTE — Progress Notes (Unsigned)
 "  [facility] Note    Referring Physician: 08/25 admission PCP: Pcp, No Cardiologist: Redell Cave, MD   Chief Complaint: HF visit   HPI:  Mr Eichel is a 69 y/o male with a history of HTN, dyslipidemia, transaminitis, vitamin D  deficiency, T2DM, atrial flutter/ fibrillation, (s/p cardioversion 07/17/23), COPD, iron  deficient anemia (08/25), tobacco use and chronic heart failure.   Echo 08/04/2019: EF of 25-30% along with mild/moderate TR.  Echo 01/13/20: EF of 50-55% with moderate LVH.   Admitted 07/10/23 due to worsening shortness of breath, orthopnea, PND and worsening swelling in legs/ feet. Found to have HF exacerbation. Echo 07/12/2023 showed EF estimated at 20 to 25%, moderate LVH, moderately reduced RV function, indeterminate LV diastolic parameters, moderate to severe tricuspid regurgitation.  TEE 07/17/23: no thrombus. Cardioverted 07/17/23. Cardiac MRI 07/18/23: showed severely reduced RV function and LV systolic function but no evidence of myocardial infiltrative disease.  Had been in the [place] [facility] from March through August 2025   Admitted 04/03/24 with increasing shortness of breath and lower extremity swelling for the last month. Has been in [place] [facility] since March 2025. BP in the ED was 185/125 and oxygen saturation was 97% on room air. Chest x-ray showed cardiomegaly and pulmonary congestion. IV diuresed with lasix  drip with transition to oral diuretics. X-ray Left foot: No fracture, nonspecific edema. Augmentin  given for possible foot cellulitis. NSVT on telemetry on 04/04/2024. Elevated liver enzymes: Probably from hepatic congestion from CHF. Liver ultrasound did not show any acute findings.Tsat 6%, Venofer  300 mg IV one-time dose given.   Was in the ED 04/20/24 due to wounds on bilateral feet. Xrays negative except for mild soft tissue swelling. Antibiotic and bacitracin  ointment given.   Was in the ED 06/18/24 due to  bilateral foot pain/ swelling. Continues with wound on left anterior / lateral foot. Bilateral foot xrays negative for acute bony process. Right foot imaging notes possible ulceration.   Admitted 07/09/24 with leg edema and puffy eyes after being out of medications for ~ 1 week. IV diuresed. Cardiology curbside. Echo 07/10/24: EF 20-25%, moderate LVH, G2DD, moderately reduced RV, severely elevated PA pressure. Symptoms improved and diuretic changed to oral. Abnormal LFT's likely due to liver congestion with HF exacerbation.   Seen in [facility] 07/26/24 where entresto  was increased to 97/103mg  BID. Labs were drawn and he was unable to be reached with results.   Saw cardiology 08/05/24. EP referral was placed.    He presents today with a chief complaint of a HF visit. Denies shortness of breath, fatigue, chest pain, palpitations, dizziness, edema. Overall he says that he feels great. Smoking 1.5 cigarettes daily but is working on complete stoppage. Walking with a rolling walker.   ROS: All systems negative except what is listed in HPI, PMH and Problem List  Past Medical History:  Diagnosis Date   CHF (congestive heart failure) (HCC)    COPD (chronic obstructive pulmonary disease) (HCC)    Diabetes mellitus without complication (HCC)    HFrEF (heart failure with reduced ejection fraction) (HCC)    a. 2019 reported neg stress test and nl cors on cath in [facility]; b. 07/2019 Echo: EF 25-30%, Gr2 DD, glob HK. Mod reduced RV fxn w/ volume overload. Mod dil RA. Mild to mod TR. Mod elev PASP; b. 12/2019 Echo: EF 50-55%; c. 05/2020 Echo: EF 45-50%, glob HK, sev LVH, GrII DD, nl RV size/fxn, mildly BAE, mild AoV sclerosis, mildly dilated Ao root.  Hypertension    NICM (nonischemic cardiomyopathy) (HCC)    Noncompliance    Pleural effusion on right    a. 07/2019 Thoracentesis: 300 ml.   Tobacco abuse     Current Outpatient Medications  Medication Sig Dispense Refill   albuterol  (VENTOLIN  HFA) 108 (90  Base) MCG/ACT inhaler Inhale 2 puffs into the lungs every 6 (six) hours as needed for wheezing or shortness of breath. 6.7 g 0   apixaban  (ELIQUIS ) 5 MG TABS tablet Take 1 tablet (5 mg total) by mouth 2 (two) times daily. 180 tablet 3   ascorbic acid  (VITAMIN C ) 500 MG tablet Take 1 tablet (500 mg total) by mouth daily. 30 tablet 2   atorvastatin  (LIPITOR) 20 MG tablet Take 1 tablet (20 mg total) by mouth daily. 90 tablet 3   Blood Pressure KIT 1 kit by Does not apply route daily. 1 kit 0   carvedilol  (COREG ) 25 MG tablet Take 1 tablet (25 mg total) by mouth 2 (two) times daily with a meal. 180 tablet 3   dapagliflozin  propanediol (FARXIGA ) 10 MG TABS tablet Take 1 tablet (10 mg total) by mouth daily. 90 tablet 3   iron  polysaccharides (NIFEREX) 150 MG capsule Take 1 capsule (150 mg total) by mouth daily. 30 capsule 2   sacubitril -valsartan  (ENTRESTO ) 97-103 MG Take 1 tablet by mouth 2 (two) times daily. 180 tablet 3   silver  sulfADIAZINE  (SILVADENE ) 1 % cream Apply 1 Application topically daily.     spironolactone  (ALDACTONE ) 25 MG tablet Take 1 tablet (25 mg total) by mouth daily. 90 tablet 3   tamsulosin  (FLOMAX ) 0.4 MG CAPS capsule Take 1 capsule (0.4 mg total) by mouth daily after supper. 30 capsule 0   torsemide  (DEMADEX ) 20 MG tablet Take 1 tablet (20 mg total) by mouth daily. Additional one or two tablets for weight gain as instructed by your provider. 135 tablet 3   No current facility-administered medications for this visit.    No Known Allergies    Social History   Socioeconomic History   Marital status: Single    Spouse name: Not on file   Number of children: 2   Years of education: Not on file   Highest education level: High school graduate  Occupational History   Occupation: unemployed  Tobacco Use   Smoking status: Every Day    Current packs/day: 0.50    Average packs/day: 0.3 packs/day for 26.0 years (8.0 ttl pk-yrs)    Types: Cigarettes    Start date: 2020    Smokeless tobacco: Never   Tobacco comments:    Stopped 2 years ago. 2023  Vaping Use   Vaping status: Never Used  Substance and Sexual Activity   Alcohol use: Never   Drug use: Not Currently   Sexual activity: Not Currently  Other Topics Concern   Not on file  Social History Narrative   Patient moved to [place]  from [place]  in March 2020, to take care of his Mother. She lives in a community managed by her church, independent living for seniors. Patient lives with his Mother, does not drive, and reports he mostly keeps to himself.   Social Drivers of Health   Tobacco Use: High Risk (08/05/2024)   Patient History    Smoking Tobacco Use: Every Day    Smokeless Tobacco Use: Never    Passive Exposure: Not on file  Financial Resource Strain: High Risk (04/05/2024)   Overall Financial Resource Strain (CARDIA)    Difficulty of Paying Living  Expenses: Hard  Food Insecurity: No Food Insecurity (07/11/2024)   Epic    Worried About Programme Researcher, Broadcasting/film/video in the Last Year: Never true    Ran Out of Food in the Last Year: Never true  Transportation Needs: Unmet Transportation Needs (08/23/2024)   Epic    Lack of Transportation (Medical): Yes    Lack of Transportation (Non-Medical): Yes  Physical Activity: Not on file  Stress: Not on file  Social Connections: Moderately Integrated (07/11/2024)   Social Connection and Isolation Panel    Frequency of Communication with Friends and Family: More than three times a week    Frequency of Social Gatherings with Friends and Family: Once a week    Attends Religious Services: 1 to 4 times per year    Active Member of Clubs or Organizations: No    Attends Banker Meetings: 1 to 4 times per year    Marital Status: Never married  Intimate Partner Violence: Not At Risk (07/11/2024)   Epic    Fear of Current or Ex-Partner: No    Emotionally Abused: No    Physically Abused: No    Sexually Abused: No  Depression (PHQ2-9): Low Risk  (09/18/2021)   Depression (PHQ2-9)    PHQ-2 Score: 0  Alcohol Screen: Low Risk (07/14/2023)   Alcohol Screen    Last Alcohol Screening Score (AUDIT): 0  Housing: Low Risk (07/11/2024)   Epic    Unable to Pay for Housing in the Last Year: No    Number of Times Moved in the Last Year: 1    Homeless in the Last Year: No  Utilities: Not At Risk (07/11/2024)   Epic    Threatened with loss of utilities: No  Health Literacy: Not on file     No family history on file.  Vitals:   08/27/24 0959  BP: 109/70  Pulse: (!) 58  SpO2: 97%  Weight: 179 lb (81.2 kg)   Wt Readings from Last 3 Encounters:  08/27/24 179 lb (81.2 kg)  08/05/24 182 lb 4 oz (82.7 kg)  07/26/24 177 lb 9.6 oz (80.6 kg)   Lab Results  Component Value Date   CREATININE 1.33 (H) 07/26/2024   CREATININE 1.14 07/13/2024   CREATININE 1.09 07/12/2024     PHYSICAL EXAM:  General: Well appearing.  Cor: No JVD. Regular rhythm, bradycardic.  Lungs: clear Abdomen: soft, nontender, nondistended. Extremities: no edema Neuro:. Affect pleasant   ECG done 08/05/24: SB, 1st degree AV block, LBBB   ASSESSMENT & PLAN:  1: NICM with reduced ejection fraction- - suspect due to HTN - NYHA class I - euvolemic today - weight up 2 pounds from last visit here 1 month ago - Echo 08/04/2019: EF of 25-30% along with mild/moderate TR.  - Echo 01/13/20: EF of 50-55% with moderate LVH.  - Echo 07/12/23: EF 20-25% with moderate LVH, normal PA pressure of 35.9 mmHg, moderate LAE, mild MR, moderate/severe TR - TEE 07/17/23: no thrombus - cMRI 07/18/23:    ECV 33%   1. Mildly dilated LV size, severely reduced LV systolic function. LVEF 26%.    2. There is a thin, midwall, basal septal LGE.   3. Severely reduced RV function.   4. No significant valvular abnormalities.   5. No evidence for myocardial infiltrative disease.   6. Findings consistent with dilated nonischemic cardiomyopathy. - Echo 07/10/24: EF 20-25%, moderate LVH,  G2DD, moderately reduced RV, severely elevated PA pressure.  - has upcoming EP appointment to  discuss ICD - continue carvedilol  25mg  BID - continue farxiga  10mg  daily - continue entresto  97/103mg  BID - continue spironolactone  25mg  daily - continue torsemide  20mg  daily - CMP/ proBNP today - saw cardiology (Agbor-Etang) 05/21 - proBNP 07/26/24 was 948.0  2: HTN- - BP  - BMP 07/26/24 reviewed: sodium 141, potassium 4.8, creatinine 1.33 and GFR 59 - information given regarding PCP options - CMP today  3: COPD-  - using nebulizer and inhalers - smoking 1.5 cigarettes daily but wants to stop completely - complete cessation discussed   4: PAF- - cardioverted 07/17/23 - continue apixaban  5mg  BID. Not a good candidate for warfarin due to history of noncompliance.  - no bleeding noted - saw cardiology [facility]) 12/25  5: Dyslipidemia- - continue atorvastatin  20mg  daily - lipid panel today   Return in 2 months, sooner if needed.   I spent 20 minutes reviewing records, interviewing/ examing patient and managing plan/ orders.   Ellouise DELENA Class FNP-C 08/26/2024  "

## 2024-08-27 ENCOUNTER — Ambulatory Visit: Attending: Family | Admitting: Family

## 2024-08-27 ENCOUNTER — Encounter: Payer: Self-pay | Admitting: Family

## 2024-08-27 VITALS — BP 109/70 | HR 58 | Wt 179.0 lb

## 2024-08-27 DIAGNOSIS — J449 Chronic obstructive pulmonary disease, unspecified: Secondary | ICD-10-CM | POA: Insufficient documentation

## 2024-08-27 DIAGNOSIS — Z560 Unemployment, unspecified: Secondary | ICD-10-CM | POA: Diagnosis not present

## 2024-08-27 DIAGNOSIS — I428 Other cardiomyopathies: Secondary | ICD-10-CM | POA: Diagnosis present

## 2024-08-27 DIAGNOSIS — I5040 Unspecified combined systolic (congestive) and diastolic (congestive) heart failure: Secondary | ICD-10-CM | POA: Diagnosis not present

## 2024-08-27 DIAGNOSIS — Z7901 Long term (current) use of anticoagulants: Secondary | ICD-10-CM | POA: Diagnosis not present

## 2024-08-27 DIAGNOSIS — R0989 Other specified symptoms and signs involving the circulatory and respiratory systems: Secondary | ICD-10-CM | POA: Diagnosis not present

## 2024-08-27 DIAGNOSIS — I071 Rheumatic tricuspid insufficiency: Secondary | ICD-10-CM | POA: Insufficient documentation

## 2024-08-27 DIAGNOSIS — I10 Essential (primary) hypertension: Secondary | ICD-10-CM

## 2024-08-27 DIAGNOSIS — Z7984 Long term (current) use of oral hypoglycemic drugs: Secondary | ICD-10-CM | POA: Insufficient documentation

## 2024-08-27 DIAGNOSIS — I480 Paroxysmal atrial fibrillation: Secondary | ICD-10-CM | POA: Diagnosis not present

## 2024-08-27 DIAGNOSIS — Z79899 Other long term (current) drug therapy: Secondary | ICD-10-CM | POA: Diagnosis not present

## 2024-08-27 DIAGNOSIS — E785 Hyperlipidemia, unspecified: Secondary | ICD-10-CM | POA: Diagnosis not present

## 2024-08-27 DIAGNOSIS — R748 Abnormal levels of other serum enzymes: Secondary | ICD-10-CM | POA: Insufficient documentation

## 2024-08-27 DIAGNOSIS — Z5982 Transportation insecurity: Secondary | ICD-10-CM | POA: Insufficient documentation

## 2024-08-27 DIAGNOSIS — I110 Hypertensive heart disease with heart failure: Secondary | ICD-10-CM | POA: Insufficient documentation

## 2024-08-27 DIAGNOSIS — Z59868 Other specified financial insecurity: Secondary | ICD-10-CM | POA: Diagnosis not present

## 2024-08-27 DIAGNOSIS — F17210 Nicotine dependence, cigarettes, uncomplicated: Secondary | ICD-10-CM | POA: Insufficient documentation

## 2024-08-27 DIAGNOSIS — E119 Type 2 diabetes mellitus without complications: Secondary | ICD-10-CM | POA: Insufficient documentation

## 2024-08-27 DIAGNOSIS — R7989 Other specified abnormal findings of blood chemistry: Secondary | ICD-10-CM | POA: Diagnosis not present

## 2024-08-27 LAB — PRO B NATRIURETIC PEPTIDE: NT-Pro BNP: 249 pg/mL (ref 0–376)

## 2024-08-27 LAB — LIPID PANEL
Chol/HDL Ratio: 3 ratio (ref 0.0–5.0)
Cholesterol, Total: 139 mg/dL (ref 100–199)
HDL: 47 mg/dL
LDL Chol Calc (NIH): 79 mg/dL (ref 0–99)
Triglycerides: 63 mg/dL (ref 0–149)
VLDL Cholesterol Cal: 13 mg/dL (ref 5–40)

## 2024-08-27 LAB — COMPREHENSIVE METABOLIC PANEL WITH GFR
ALT: 16 IU/L (ref 0–44)
AST: 19 IU/L (ref 0–40)
Albumin: 4.2 g/dL (ref 3.9–4.9)
Alkaline Phosphatase: 100 IU/L (ref 47–123)
BUN/Creatinine Ratio: 24 (ref 10–24)
BUN: 37 mg/dL — ABNORMAL HIGH (ref 8–27)
Bilirubin Total: 0.3 mg/dL (ref 0.0–1.2)
CO2: 24 mmol/L (ref 20–29)
Calcium: 9.1 mg/dL (ref 8.6–10.2)
Chloride: 99 mmol/L (ref 96–106)
Creatinine, Ser: 1.53 mg/dL — ABNORMAL HIGH (ref 0.76–1.27)
Globulin, Total: 2.6 g/dL (ref 1.5–4.5)
Glucose: 93 mg/dL (ref 70–99)
Potassium: 4.2 mmol/L (ref 3.5–5.2)
Sodium: 141 mmol/L (ref 134–144)
Total Protein: 6.8 g/dL (ref 6.0–8.5)
eGFR: 50 mL/min/1.73 — ABNORMAL LOW

## 2024-08-27 NOTE — Patient Instructions (Signed)
 Medication Changes:  No medication changes today!  Lab Work:  Go downstairs to [facility] on [address] to have your blood work completed.  We will only call you if the results are abnormal or if the provider would like to make medication changes.  No news is good news.   The number to [facility] is: [phone]  Follow-Up in: Please follow up with the [facility] in 2 months with Ellouise Class, FNP.   Thank you for choosing [facility].    At the [facility], you and your health needs are our priority. We have a designated team specialized in the treatment of Heart Failure. This Care Team includes your primary Heart Failure Specialized Cardiologist (physician), Advanced Practice Providers (APPs- Physician Assistants and Nurse Practitioners), and Pharmacist who all work together to provide you with the care you need, when you need it.   You may see any of the following providers on your designated Care Team at your next follow up:  Dr. Toribio Fuel Dr. Ezra Shuck Dr. Ria Commander Dr. Morene Brownie Ellouise Class, FNP Jaun Bash, RPH-CPP  Please be sure to bring in all your medications bottles to every appointment.   Need to Contact Us :  If you have any questions or concerns before your next appointment please send us  a message through [facility] or call our office at [phone].    TO LEAVE A MESSAGE FOR THE NURSE SELECT OPTION 2, PLEASE LEAVE A MESSAGE INCLUDING: YOUR NAME DATE OF BIRTH CALL BACK NUMBER REASON FOR CALL**this is important as we prioritize the call backs  YOU WILL RECEIVE A CALL BACK THE SAME DAY AS LONG AS YOU CALL BEFORE 4:00 PM

## 2024-08-29 ENCOUNTER — Ambulatory Visit: Payer: Self-pay | Admitting: Family

## 2024-08-31 MED ORDER — SPIRONOLACTONE 25 MG PO TABS: 12.5000 mg | ORAL_TABLET | Freq: Every day | ORAL | 3 refills | Status: AC

## 2024-08-31 NOTE — Telephone Encounter (Signed)
 Spoke to pt about lab work. Pt agreeable to decreasing med dosage. Sent in new dosage to preferred pharmacy. No further questions at this time.

## 2024-09-29 ENCOUNTER — Ambulatory Visit: Admitting: Cardiology

## 2024-09-29 NOTE — Progress Notes (Unsigned)
 " Electrophysiology Office Note:   Date:  09/29/2024  ID:  Kevin [name], DOB [birth year], MRN [id number]  Primary Cardiologist: Redell Cave, MD Electrophysiologist: Fonda Kitty, MD  {Click to update primary MD,subspecialty MD or APP then REFRESH:1}    History of Present Illness:   Kevin [name] is a 69 y.o. male with h/o HTN, dyslipidemia, transaminitis, vitamin D  deficiency, T2DM, atrial flutter/ fibrillation, (s/p cardioversion 07/17/23), COPD, iron  deficient anemia (08/25), tobacco use and chronic systolic heart failure who is being seen today for evaluation for CRT-D.   Discussed the use of AI scribe software for clinical note transcription with the patient, who gave verbal consent to proceed.  History of Present Illness     Review of systems complete and found to be negative unless listed in HPI.   EP Information / Studies Reviewed:    EKG is not ordered today. EKG from 08/05/24 reviewed which showed SR with LBBB, PR and QRS .       Echo 07/10/24:   1. Left ventricular ejection fraction, by estimation, is 20 to 25%. Left  ventricular ejection fraction by 3D volume is 22 %. The left ventricle has  severely decreased function. The left ventricle demonstrates global  hypokinesis. There is moderate  asymmetric left ventricular hypertrophy of the septal segment. Left  ventricular diastolic parameters are consistent with Grade II diastolic  dysfunction (pseudonormalization). Elevated left ventricular end-diastolic  pressure. The average left ventricular  global longitudinal strain is -6.4 %. The global longitudinal strain is  abnormal.   2. Right ventricular systolic function is moderately reduced. The right  ventricular size is normal. There is severely elevated pulmonary artery  systolic pressure.   3. Left atrial size was severely dilated.   4. Right atrial size was severely dilated.   5. The mitral valve is normal in structure. No evidence of mitral valve   regurgitation. No evidence of mitral stenosis.   6. Functional tricuspid regurgitation due to annular dilitation.  Tricuspid valve regurgitation is moderate.   7. The aortic valve is tricuspid. Aortic valve regurgitation is trivial.  No aortic stenosis is present.   8. The inferior vena cava is dilated in size with <50% respiratory  variability, suggesting right atrial pressure of 15 mmHg.   Cardiac MRI 07/18/23: IMPRESSION: 1. Mildly dilated LV size, severely reduced LV systolic function. LVEF 26%.   2. There is a thin, midwall, basal septal LGE.   3. Severely reduced RV function.   4. No significant valvular abnormalities.   5. No evidence for myocardial infiltrative disease.   6. Findings consistent with dilated nonischemic cardiomyopathy.  Risk Assessment/Calculations:    CHA2DS2-VASc Score = 3  {Confirm score is correct.  If not, click here to update score.  REFRESH note.  :1} This indicates a 3.2% annual risk of stroke. The patient's score is based upon: CHF History: 1 HTN History: 1 Diabetes History: 0 Stroke History: 0 Vascular Disease History: 0 Age Score: 1 Gender Score: 0   {This patient has a significant risk of stroke if diagnosed with atrial fibrillation.  Please consider VKA or DOAC agent for anticoagulation if the bleeding risk is acceptable.   You can also use the SmartPhrase .[email or web address] for documentation.   :[id number]} No BP recorded.  {Refresh Note OR Click here to enter BP  :1}***        Physical Exam:   VS:  There were no vitals taken for this visit.   Wt Readings from Last 3 Encounters:  08/27/24  179 lb (81.2 kg)  08/05/24 182 lb 4 oz (82.7 kg)  07/26/24 177 lb 9.6 oz (80.6 kg)     GEN: Well nourished, well developed in no acute distress NECK: No JVD CARDIAC: {EPRHYTHM:28826}, no murmurs, rubs, gallops RESPIRATORY:  Clear to auscultation without rales, wheezing or rhonchi  ABDOMEN: Soft, non-distended EXTREMITIES:  No edema; No deformity    ASSESSMENT AND PLAN:   Assessment and Plan Assessment & Plan       Follow up with {EPMDS:28135::EP Team} {EPFOLLOW LE:71826}  Signed, Fonda Kitty, MD  "

## 2024-10-25 ENCOUNTER — Ambulatory Visit: Admitting: Family

## 2024-11-02 ENCOUNTER — Ambulatory Visit: Admitting: Cardiology
